# Patient Record
Sex: Male | Born: 1945 | Race: White | Hispanic: No | Marital: Married | Smoking: Former smoker
Health system: Southern US, Community
[De-identification: ages and names within clinical notes are randomized; demographics above are authoritative.]

## PROBLEM LIST (undated history)

## (undated) DIAGNOSIS — I509 Heart failure, unspecified: Secondary | ICD-10-CM

## (undated) DIAGNOSIS — Z860100 Personal history of colon polyps, unspecified: Secondary | ICD-10-CM

## (undated) DIAGNOSIS — E785 Hyperlipidemia, unspecified: Secondary | ICD-10-CM

## (undated) DIAGNOSIS — E669 Obesity, unspecified: Secondary | ICD-10-CM

## (undated) DIAGNOSIS — I10 Essential (primary) hypertension: Secondary | ICD-10-CM

## (undated) DIAGNOSIS — I739 Peripheral vascular disease, unspecified: Secondary | ICD-10-CM

## (undated) DIAGNOSIS — H9190 Unspecified hearing loss, unspecified ear: Secondary | ICD-10-CM

## (undated) DIAGNOSIS — I2510 Atherosclerotic heart disease of native coronary artery without angina pectoris: Secondary | ICD-10-CM

## (undated) DIAGNOSIS — L80 Vitiligo: Secondary | ICD-10-CM

## (undated) DIAGNOSIS — Z8669 Personal history of other diseases of the nervous system and sense organs: Secondary | ICD-10-CM

## (undated) DIAGNOSIS — N189 Chronic kidney disease, unspecified: Secondary | ICD-10-CM

## (undated) DIAGNOSIS — C801 Malignant (primary) neoplasm, unspecified: Secondary | ICD-10-CM

## (undated) DIAGNOSIS — I272 Pulmonary hypertension, unspecified: Secondary | ICD-10-CM

## (undated) HISTORY — DX: Unspecified hearing loss, unspecified ear: H91.90

## (undated) HISTORY — DX: Chronic kidney disease, unspecified: N18.9

## (undated) HISTORY — DX: Personal history of colonic polyps: Z86.010

## (undated) HISTORY — PX: SKIN BIOPSY: SHX1

## (undated) HISTORY — PX: TONSILLECTOMY AND ADENOIDECTOMY: SUR1326

## (undated) HISTORY — DX: Peripheral vascular disease, unspecified: I73.9

## (undated) HISTORY — DX: Obesity, unspecified: E66.9

## (undated) HISTORY — DX: Personal history of colon polyps, unspecified: Z86.0100

## (undated) HISTORY — DX: Personal history of other diseases of the nervous system and sense organs: Z86.69

## (undated) HISTORY — PX: FRACTURE SURGERY: SHX138

## (undated) HISTORY — DX: Vitiligo: L80

---

## 2001-01-30 HISTORY — PX: COLON SURGERY: SHX602

## 2001-02-08 ENCOUNTER — Ambulatory Visit (HOSPITAL_COMMUNITY): Admission: RE | Admit: 2001-02-08 | Discharge: 2001-02-08 | Payer: Self-pay | Admitting: Gastroenterology

## 2001-02-08 ENCOUNTER — Encounter (INDEPENDENT_AMBULATORY_CARE_PROVIDER_SITE_OTHER): Payer: Self-pay | Admitting: *Deleted

## 2001-03-07 ENCOUNTER — Encounter: Payer: Self-pay | Admitting: *Deleted

## 2001-03-13 ENCOUNTER — Inpatient Hospital Stay (HOSPITAL_COMMUNITY): Admission: RE | Admit: 2001-03-13 | Discharge: 2001-03-20 | Payer: Self-pay | Admitting: *Deleted

## 2001-03-13 ENCOUNTER — Encounter (INDEPENDENT_AMBULATORY_CARE_PROVIDER_SITE_OTHER): Payer: Self-pay

## 2003-12-03 ENCOUNTER — Encounter: Admission: RE | Admit: 2003-12-03 | Discharge: 2003-12-03 | Payer: Self-pay | Admitting: Family Medicine

## 2005-06-09 ENCOUNTER — Ambulatory Visit (HOSPITAL_COMMUNITY): Admission: RE | Admit: 2005-06-09 | Discharge: 2005-06-09 | Payer: Self-pay | Admitting: Urology

## 2005-07-21 ENCOUNTER — Ambulatory Visit (HOSPITAL_COMMUNITY): Admission: RE | Admit: 2005-07-21 | Discharge: 2005-07-21 | Payer: Self-pay | Admitting: Urology

## 2006-06-06 ENCOUNTER — Ambulatory Visit: Payer: Self-pay | Admitting: Family Medicine

## 2006-07-04 ENCOUNTER — Ambulatory Visit: Payer: Self-pay | Admitting: Family Medicine

## 2008-12-01 ENCOUNTER — Ambulatory Visit: Payer: Self-pay | Admitting: Family Medicine

## 2010-09-17 NOTE — Op Note (Signed)
[facility]  Patient:    [name], [name] Visit Number: [id number] MRN: [id number]          Service Type: SUR Location: 3W 0382 01 Attending Physician:  Kandis Mannan Dictated by:   Donnie Coffin Samuella Cota, M.D. Proc. Date: 03/13/01 Admit Date:  03/13/2001   CC:         Griffith Citron, M.D.  Elvina Sidle, M.D.   Operative Report  CCS:  [id number]  PREOPERATIVE DIAGNOSIS:  Villous adenoma right colon.  POSTOPERATIVE DIAGNOSIS:  Villous adenoma right colon.  OPERATION:  Right colectomy.  SURGEON:  Maisie Fus B. Samuella Cota, M.D.  ASSISTANT: Abigail Miyamoto, M.D.  ANESTHESIA:  General.  ANESTHESIOLOGIST:  Mack Guise, M.D. and CRNA [facility].  PROCEDURE:  The patient was taken to the operating room and placed on the table in the supine position.  After satisfactory general anesthesia with intubation, the entire abdomen was prepped and a Foley catheter was inserted. Sterile drapes were applied.  Midline incision was made from the upper midline around the left side of the umbilicus to the mid lower abdomen.  The incision was taken through the skin and subcutaneous tissues in the midline fascia.  The abdomen was entered and the small bowel was run from the ligament of Treitz to the ileocecal valve.  There were no abnormalities except for some slight adhesions of the terminal ileum.  The patients liver was quite high and felt normal.  The right colon was then taken down by dividing the lateral peritoneal reflections mostly with the cautery.  The hepatic flexure was taken down and the duodenum was seen and preserved.  The tumor could be felt in the right transverse colon which was to the right of the middle colic vessels. Staying 3-4 cm distal to the tumor, the large bowel was cross-clamped with Allen clamp and a Kocher clamp.  The terminal ileum was dissected free and only about one or two inches of terminal ileum was taken.  Allen clamp and Kocher  clamp were placed across the small bowel.  The mesenteric end to the small bowel and right transverse colon were taken down with the bleeders being ligated with 2-0 black silk with ileocolic vessels being doubly ligated. Mesenteric defect was closed with running suture of 2-0 chromic catgut.  The bowel was then anastomosed in an open anastomosis.  Prior to doing the anastomosis, the specimen was opened and seen to be about a 3 cm margin from the tumor to the edge of the bowel.  The sutures used to open the bowel actually extended towards the tumor.  Dr. Delorse Lek reported that the margin was only about 5 mm but I think he was seeing where I cut to the tumor as I opened the bowel.  I was satisfied that I had at least a 3 cm margin.  The bowel was anastomosed with 3-0 black silk as through-and-through sutures with the knots on the inside except for the last few sutures which were placed as Gambee sutures.  A liberal two fingerbreadth anastomosis was created and seen to be air tight.  The patients right upper quadrant was irrigated. Hemostasis was obtained.  The patient had considerable amount of omentum which was lying over the anastomosis.  The abdomen was then closed without drains. The midline fascia was closed with two #1 Novofil sutures with the knot tied in the middle.  Subcutaneous tissues was copiously irrigated.  The skin was closed with skin staples.  Dry, sterile, dressing was applied.  The patient was seen to tolerate the procedure well and was taken to the PACU in satisfactory condition. Dictated by:   Donnie Coffin Samuella Cota, M.D. Attending Physician:  Kandis Mannan DD:  03/13/01 TD:  03/13/01 Job: [id number]

## 2010-09-17 NOTE — Discharge Summary (Signed)
[facility]  Patient:    [name], [name] Visit Number: [id number] MRN: [id number]          Service Type: SUR Location: 3W 0382 01 Attending Physician:  Kandis Mannan Dictated by:   Donnie Coffin Samuella Cota, M.D. Admit Date:  03/13/2001 Discharge Date: 03/20/2001   CC:         Griffith Citron, M.D.  Elvina Sidle, M.D.   Discharge Summary  CHIEF COMPLAINT:  Villous adenoma, right colon.  HISTORY OF PRESENT ILLNESS:  This 65 year old white male was seen in consultation with Dr. Sharrell Ku and Dr. Elvina Sidle.  The patient had had occasional bright red rectal bleeding with constipated stools in the past but otherwise had no GI symptoms.  He underwent screening colonoscopy on February 08, 2001 by Dr. Ritta Slot.  The patient was found to have a 4-cm fungating mucosal lesion of the right colon at the hepatic flexure.  Biopsies revealed villous adenoma with no high-grade dysplasia or malignancy.  The patient also had six other polyps removed by Dr. Kinnie Scales and all of these were benign.  I saw the patient in the office on February 15, 2001.  Because of the size of the tumor, it could not be removed through the colonoscope and the patient was placed on a bowel prep at home and admitted now for a right colectomy.  The patient has undergone a successful bowel prep at home.  ALLERGIES:  None but CODEINE produces a headache.  MEDICATIONS:  None.  OPERATIONS:  Surgery on his left leg and ankle in 1968 following a fracture with apparently exposed bone.  Also had surgery on his left hand at his index finger following a cat bite in 1988.  SERIOUS ILLNESSES/INJURIES:  See above.  REVIEW OF SYSTEMS:  Wears glasses.  Smoked for about 35 years but quit five years ago.  No cardiac abnormalities.  Bowel elimination usually once a day. The patient was told that he may have passed a renal stone four or five years ago but the stone was never retrieved.  He  has occasional nocturia x 1.  MARITAL HISTORY:  Wife is 55 years old.  They have one daughter.  PHYSICAL EXAMINATION:  VITAL SIGNS:  Weight 260 pounds.  GENERAL:  The patient is a well-nourished, well-developed, obese male in no acute distress.  ABDOMEN:  Abdomen was obese.  Liver, spleen, kidneys were not felt.  Both testicles were down without masses.  No evidence of groin hernias.  RECTAL:  No masses.  IMPRESSION:  Villous adenoma, right colon.  HOSPITAL COURSE:  The patient was taken to the operating room on March 13, 2001, at which time he underwent a right colectomy.  He was found to have approximately a 4-cm tumor at the hepatic flexure in the transverse colon just to the right of the middle colic vessels.  Grossly, there was about a 3-cm margin from the end of the bowel.  Exploration was otherwise unremarkable.  Nasogastric tube was not placed but the patient was left n.p.o. A Foley catheter was inserted, which was removed on the second postoperative day.  The patient was started on clear liquids on the fourth postoperative day, and then progressed to a regular diet on his sixth postoperative day.  He had a bowel movement before he was discharged on the seventh postoperative day.  His wound was healing nicely with no evidence of any infection.  The skin staples were removed and benzoin and Steri-Strips were applied.  LABORATORY  DATA:  Laboratory data while in the hospital revealed a preoperative hemoglobin of 15.3 with hematocrit of 44.0, white count was 7200. Postop hemoglobin was 13.9, hematocrit 40.1, with a white count of 7700.  CMET was entirely within normal limits.  EKG showed normal sinus rhythm, nonspecific ST abnormalities, abnormal EKG. Chest x-ray showed no active disease.  FINAL DIAGNOSIS:  Villous adenoma, right transverse colon, with no invasive tumor identified.  The tumor size was 3.4 x 2.7 x 0.9 cm.  Nine mesenteric lymph nodes showed no tumor  identified.  OPERATION:  March 13, 2001 right colectomy.  CONDITION ON DISCHARGE:  Improved.  DIET:  The patient was discharged on a regular diet.  DISCHARGE MEDICATIONS:  Vicodin for pain.  FOLLOWUP:  Advised to see me back in the office for followup in 10-12 days. Dictated by:   Donnie Coffin Samuella Cota, M.D. Attending Physician:  Kandis Mannan DD:  04/02/01 TD:  04/02/01 Job: [id number]

## 2010-11-08 ENCOUNTER — Encounter: Payer: Self-pay | Admitting: Family Medicine

## 2011-08-22 DIAGNOSIS — L219 Seborrheic dermatitis, unspecified: Secondary | ICD-10-CM | POA: Insufficient documentation

## 2019-01-23 ENCOUNTER — Other Ambulatory Visit: Payer: Self-pay | Admitting: Urology

## 2019-01-30 NOTE — Patient Instructions (Addendum)
DUE TO COVID-19 ONLY ONE VISITOR IS ALLOWED TO COME WITH YOU AND STAY IN THE WAITING ROOM ONLY DURING PRE OP AND PROCEDURE DAY OF SURGERY. THE 1 VISITOR MAY VISIT WITH YOU AFTER SURGERY IN YOUR PRIVATE ROOM DURING VISITING HOURS ONLY!  YOU NEED TO HAVE A COVID 19 TEST ON 01-31-19 @ 2:50 PM, THIS TEST MUST BE DONE BEFORE SURGERY, COME  [address], [place] , [address].  ([place]) ONCE YOUR COVID TEST IS COMPLETED, PLEASE BEGIN THE QUARANTINE INSTRUCTIONS AS OUTLINED IN YOUR HANDOUT.                [name]  01/30/2019   Your procedure is scheduled on: 02-04-19    Report to [facility] Main  Entrance    Report to Admitting at 8:45 AM     Call this number if you have problems the morning of surgery [phone]    Remember: Do not eat food or drink liquids :After Midnight.      Take these medicines the morning of surgery with A SIP OF WATER: None  BRUSH YOUR TEETH MORNING OF SURGERY AND RINSE YOUR MOUTH OUT, NO CHEWING GUM CANDY OR MINTS.                                 You may not have any metal on your body including hair pins and              piercings     Do not wear jewelry,cologne, lotions, powders or deodorant                        Men may shave face and neck.   Do not bring valuables to the hospital. [place].  Contacts, dentures or bridgework may not be worn into surgery.      Patients discharged the day of surgery will not be allowed to drive home. IF YOU ARE HAVING SURGERY AND GOING HOME THE SAME DAY, YOU MUST HAVE AN ADULT TO DRIVE YOU HOME AND BE WITH YOU FOR 24 HOURS. YOU MAY GO HOME BY TAXI OR UBER OR ORTHERWISE, BUT AN ADULT MUST ACCOMPANY YOU HOME AND STAY WITH YOU FOR 24 HOURS.    Name and phone number of your driver: Travyn Hulick [phone]  Special Instructions: N/A              Please read over the following fact sheets you were  given: _____________________________________________________________________             [facility] - Preparing for Surgery Before surgery, you can play an important role.  Because skin is not sterile, your skin needs to be as free of germs as possible.  You can reduce the number of germs on your skin by washing with CHG (chlorahexidine gluconate) soap before surgery.  CHG is an antiseptic cleaner which kills germs and bonds with the skin to continue killing germs even after washing. Please DO NOT use if you have an allergy to CHG or antibacterial soaps.  If your skin becomes reddened/irritated stop using the CHG and inform your nurse when you arrive at Short Stay. Do not shave (including legs and underarms) for at least 48 hours prior to the first CHG shower.  You may shave your  face/neck. Please follow these instructions carefully:  1.  Shower with CHG Soap the night before surgery and the  morning of Surgery.  2.  If you choose to wash your hair, wash your hair first as usual with your  normal  shampoo.  3.  After you shampoo, rinse your hair and body thoroughly to remove the  shampoo.                           4.  Use CHG as you would any other liquid soap.  You can apply chg directly  to the skin and wash                       Gently with a scrungie or clean washcloth.  5.  Apply the CHG Soap to your body ONLY FROM THE NECK DOWN.   Do not use on face/ open                           Wound or open sores. Avoid contact with eyes, ears mouth and genitals (private parts).                       Wash face,  Genitals (private parts) with your normal soap.             6.  Wash thoroughly, paying special attention to the area where your surgery  will be performed.  7.  Thoroughly rinse your body with warm water from the neck down.  8.  DO NOT shower/wash with your normal soap after using and rinsing off  the CHG Soap.                9.  Pat yourself dry with a clean towel.            10.  Wear  clean pajamas.            11.  Place clean sheets on your bed the night of your first shower and do not  sleep with pets. Day of Surgery : Do not apply any lotions/deodorants the morning of surgery.  Please wear clean clothes to the hospital/surgery center.  FAILURE TO FOLLOW THESE INSTRUCTIONS MAY RESULT IN THE CANCELLATION OF YOUR SURGERY PATIENT SIGNATURE_________________________________  NURSE SIGNATURE__________________________________  ________________________________________________________________________

## 2019-01-31 ENCOUNTER — Other Ambulatory Visit: Payer: Self-pay

## 2019-01-31 ENCOUNTER — Other Ambulatory Visit (HOSPITAL_COMMUNITY)
Admission: RE | Admit: 2019-01-31 | Discharge: 2019-01-31 | Disposition: A | Discharge status: Home / Self Care | Payer: Medicare Other | Source: Ambulatory Visit | Attending: Urology | Admitting: Urology

## 2019-01-31 ENCOUNTER — Encounter (HOSPITAL_COMMUNITY)
Admission: RE | Admit: 2019-01-31 | Discharge: 2019-01-31 | Disposition: A | Discharge status: Home / Self Care | Payer: Medicare Other | Source: Ambulatory Visit | Attending: Urology | Admitting: Urology

## 2019-01-31 ENCOUNTER — Encounter (HOSPITAL_COMMUNITY): Payer: Self-pay

## 2019-01-31 ENCOUNTER — Encounter (INDEPENDENT_AMBULATORY_CARE_PROVIDER_SITE_OTHER): Payer: Self-pay

## 2019-01-31 DIAGNOSIS — N201 Calculus of ureter: Secondary | ICD-10-CM | POA: Diagnosis not present

## 2019-01-31 DIAGNOSIS — Z01812 Encounter for preprocedural laboratory examination: Secondary | ICD-10-CM | POA: Insufficient documentation

## 2019-01-31 DIAGNOSIS — Z20828 Contact with and (suspected) exposure to other viral communicable diseases: Secondary | ICD-10-CM | POA: Insufficient documentation

## 2019-01-31 LAB — CBC
HCT: 49.5 % (ref 39.0–52.0)
Hemoglobin: 16.1 g/dL (ref 13.0–17.0)
MCH: 29 pg (ref 26.0–34.0)
MCHC: 32.5 g/dL (ref 30.0–36.0)
MCV: 89 fL (ref 80.0–100.0)
Platelets: 269 10*3/uL (ref 150–400)
RBC: 5.56 MIL/uL (ref 4.22–5.81)
RDW: 13.7 % (ref 11.5–15.5)
WBC: 9.8 10*3/uL (ref 4.0–10.5)
nRBC: 0 % (ref 0.0–0.2)

## 2019-01-31 LAB — BASIC METABOLIC PANEL
Anion gap: 8 (ref 5–15)
BUN: 15 mg/dL (ref 8–23)
CO2: 24 mmol/L (ref 22–32)
Calcium: 9 mg/dL (ref 8.9–10.3)
Chloride: 104 mmol/L (ref 98–111)
Creatinine, Ser: 0.94 mg/dL (ref 0.61–1.24)
GFR calc Af Amer: >60 mL/min (ref >60)
GFR calc non Af Amer: >60 mL/min (ref >60)
Glucose, Bld: 98 mg/dL (ref 70–99)
Potassium: 4.7 mmol/L (ref 3.5–5.1)
Sodium: 136 mmol/L (ref 135–145)

## 2019-01-31 NOTE — Progress Notes (Signed)
PCP - Dr. Veronia Beets Cardiologist -   Chest x-ray -  EKG -  Stress Test -  ECHO -  Cardiac Cath -   Sleep Study -  CPAP -   Fasting Blood Sugar -  Checks Blood Sugar _____ times a day  Blood Thinner Instructions: Aspirin Instructions: Last Dose:  Anesthesia review:   Patient denies shortness of breath, fever, cough and chest pain at PAT appointment   Patient verbalized understanding of instructions that were given to them at the PAT appointment. Patient was also instructed that they will need to review over the PAT instructions again at home before surgery.

## 2019-02-01 LAB — NOVEL CORONAVIRUS, NAA (HOSP ORDER, SEND-OUT TO REF LAB; TAT 18-24 HRS): SARS-CoV-2, NAA: NOT DETECTED

## 2019-02-02 NOTE — H&P (Signed)
Office Visit Report     01/29/2019   --------------------------------------------------------------------------------   [name]. Tarius Conary  MRN: [id number]  DOB: 1945-07-20, 73 year old Male  SSN: [id number]   PRIMARY CARE:  Chauncey Reading, MD  REFERRING:  Raynelle Bring, Eduardo Osier  PROVIDER:  Raynelle Bring, M.D.  TREATING:  Leta [place], [place]  LOCATION:  [facility] [phone]     --------------------------------------------------------------------------------   CC/HPI: Pt presents today for pre-operative history and physical exam in anticipation of cystoscopy, left retrograde pyelogram, left ureteroscopic laser lithotripsy, and left stent placement for a 60mm left UPJ stone by Dr. [name] on 02/04/19. He is doing well and is without complaint. He has not needed to use the pain medication given to him at his last visit.   Pt denies F/C, HA, CP, SOB, N/V, diarrhea/constipation, back pain, flank pain, hematuria, and dysuria.    HX:   1. Elevated PSA  2. BPH/LUTS  3. Urolithiasis  4. Microscopic hematuria   [name] follows up today after having been found to have incidentally detected microscopic hematuria with 40-60 red blood cells per high-powered field at his recent routine follow-up. He is continues to deny any hematuria. He has not passed any recent stones. He denies any flank pain symptoms. He follows up today to undergo a hematuria protocol CT scan and cystoscopy.     ALLERGIES: Codeine Derivatives - headaches    MEDICATIONS: Hydrocodone-Acetaminophen 5 mg-325 mg tablet 1-2 tablet PO Q 6 H prn  Ibuprofen CAPS Oral  Saw Grass     GU PSH: Cystoscopy - 12/14/2018 Locm 300-399Mg /Ml Iodine,1Ml - 12/14/2018       PSH Notes: Partial Colectomy, Leg Repair  lithotripsy  tonsillectomy   NON-GU PSH: Partial colectomy - 2017     GU PMH: Microscopic hematuria - 12/14/2018 Ureteral calculus - 12/14/2018 History of urolithiasis - 11/22/2017 BPH  w/LUTS - 2017 Urinary Frequency - 2017 Elevated PSA, Elevated prostate specific antigen (PSA) - 2017      PMH Notes:   1) Elevated PSA: He presented to me with a PSA in March 2017. His PSA was 5.66 and he underwent a TRUS biopsy of the prostate that was benign. He does have a family history of prostate with his father having been diagnosed at age 66 and undergone treatment with a radical prostatectomy.   Apr 2017: 12 core biopsy - benign, Vol 183.7 cc   2) BPH/LUTS: His baseline IPSS was 8 in March 2017. His prostate volume has been measured at over 180 cc.   3) Urolithiasis: He does have a history of urolithiasis. In 2019, he did spontaneously pass a stone that was confirmed to be calcium oxalate.     pt is unaware of hyperglycemia diagnosis. He has not been made aware of this and does not take medication for it.*      NON-GU PMH: Hyperglycemia, unspecified Hypertension Obesity Other idiopathic peripheral autonomic neuropathy    FAMILY HISTORY: Prostate Cancer - Father   SOCIAL HISTORY: Marital Status: Married Preferred Language: English; Ethnicity: Not Hispanic Or Latino; Race: White Current Smoking Status: Patient does not smoke anymore. Has not smoked since 01/30/1994.  Does not use smokeless tobacco. Has never drank.  Does not use drugs. Does not drink caffeine. Has not had a blood transfusion.     Notes: Former smoker   REVIEW OF SYSTEMS:    GU Review Male:   Patient reports frequent urination, hard to postpone urination, burning/ pain with urination, get up  at night to urinate, and leakage of urine. Patient denies stream starts and stops, trouble starting your stream, have to strain to urinate , erection problems, and penile pain.  Gastrointestinal (Upper):   Patient denies nausea, vomiting, and indigestion/ heartburn.  Gastrointestinal (Lower):   Patient reports constipation. Patient denies diarrhea.  Constitutional:   Patient denies fever, night sweats, weight  loss, and fatigue.  Skin:   Patient denies itching and skin rash/ lesion.  Eyes:   Patient denies blurred vision and double vision.  Ears/ Nose/ Throat:   Patient denies sore throat and sinus problems.  Hematologic/Lymphatic:   Patient denies swollen glands and easy bruising.  Cardiovascular:   Patient denies leg swelling and chest pains.  Respiratory:   Patient denies cough and shortness of breath.  Endocrine:   Patient denies excessive thirst.  Musculoskeletal:   Patient reports back pain. Patient denies joint pain.  Neurological:   Patient denies headaches and dizziness.  Psychologic:   Patient denies depression and anxiety.   Notes: blood in urine    VITAL SIGNS:      01/29/2019 03:37 PM  Weight 282 lb / 127.91 kg  Height 68 in / 172.72 cm  BP 157/89 mmHg  Pulse 81 /min  Temperature 98.1 F / 36.7 C  BMI 42.9 kg/m   MULTI-SYSTEM PHYSICAL EXAMINATION:    Constitutional: Well-nourished. No physical deformities. Normally developed. Good grooming.  Neck: Neck symmetrical, not swollen. Normal tracheal position.  Respiratory: Normal breath sounds. No labored breathing, no use of accessory muscles.   Cardiovascular: Regular rate and rhythm. No murmur, no gallop. Normal temperature, normal extremity pulses, no swelling, no varicosities.   Lymphatic: No enlargement of neck, axillae, groin.  Skin: No paleness, no jaundice, no cyanosis. No lesion, no ulcer, no rash.  Neurologic / Psychiatric: Oriented to time, oriented to place, oriented to person. No depression, no anxiety, no agitation.  Gastrointestinal: No mass, no tenderness, no rigidity, obese abdomen.   Eyes: Normal conjunctivae. Normal eyelids.  Ears, Nose, Mouth, and Throat: Left ear no scars, no lesions, no masses. Right ear no scars, no lesions, no masses. Nose no scars, no lesions, no masses. Normal hearing. Normal lips.  Musculoskeletal: Normal gait and station of head and neck.     PAST DATA REVIEWED:  Source Of  History:  Patient  Records Review:   Previous Patient Records  Urine Test Review:   Urinalysis   01/29/19  Urinalysis  Urine Appearance Cloudy   Urine Color Yellow   Urine Glucose Neg mg/dL  Urine Bilirubin Neg mg/dL  Urine Ketones Neg mg/dL  Urine Specific Gravity 1.025   Urine Blood 3+ ery/uL  Urine pH 6.0   Urine Protein 1+ mg/dL  Urine Urobilinogen 0.2 mg/dL  Urine Nitrites Neg   Urine Leukocyte Esterase 3+ leu/uL  Urine WBC/hpf Packed/hpf   Urine RBC/hpf 10 - 20/hpf   Urine Epithelial Cells 0 - 5/hpf   Urine Bacteria Few (10-25/hpf)   Urine Mucous Present   Urine Yeast NS (Not Seen)   Urine Trichomonas Not Present   Urine Cystals NS (Not Seen)   Urine Casts NS (Not Seen)   Urine Sperm Not Present    PROCEDURES:          Urinalysis w/Scope - 81001 Dipstick Dipstick Cont'd Micro  Color: Yellow Bilirubin: Neg mg/dL WBC/hpf: Packed/hpf  Appearance: Cloudy Ketones: Neg mg/dL RBC/hpf: 10 - 20/hpf  Specific Gravity: 1.025 Blood: 3+ ery/uL Bacteria: Few (10-25/hpf)  pH: 6.0 Protein: 1+ mg/dL Cystals:  NS (Not Seen)  Glucose: Neg mg/dL Urobilinogen: 0.2 mg/dL Casts: NS (Not Seen)    Nitrites: Neg Trichomonas: Not Present    Leukocyte Esterase: 3+ leu/uL Mucous: Present      Epithelial Cells: 0 - 5/hpf      Yeast: NS (Not Seen)      Sperm: Not Present    ASSESSMENT:      ICD-10 Details  1 GU:   Ureteral calculus - N20.1    PLAN:            Medications Stop Meds: Saw Grass  Discontinue: 01/29/2019  - Reason: The medication cycle was completed.            Orders Labs Urine Culture          Schedule Return Visit/Planned Activity: Keep Scheduled Appointment - Schedule Surgery          Document Letter(s):  Created for Patient: Clinical Summary         Notes:   There are no changes in the patients history or physical exam since last evaluation by Dr. [name]. Pt is scheduled to undergo left ureteroscopic laser litho and stent placement on 02/04/19.   Will  check urine culture due to bacteria noted on UA. Urine appearance is most likely due to stone, however, will r/o infection prior to procedure.   All pt's questions were answered to the best of my ability.          Next Appointment:      Next Appointment: 02/04/2019 10:30 AM    Appointment Type: Surgery     Location: [facility] [phone]    Provider: Raynelle Bring, M.D.    Reason for Visit: WL/OP CYSTO, LT RPG, LT, URS HLL LT UR STENT      * Signed by Mcarthur Rossetti, PA on 01/29/19 at 4:06 PM (EDT)*

## 2019-02-03 MED ORDER — DEXTROSE 5 % IV SOLN
3.0000 g | INTRAVENOUS | Status: AC
  Administered 2019-02-04: 3 g via INTRAVENOUS
  Filled 2019-02-03: qty 3

## 2019-02-04 ENCOUNTER — Ambulatory Visit (HOSPITAL_COMMUNITY): Payer: Medicare Other | Admitting: Anesthesiology

## 2019-02-04 ENCOUNTER — Inpatient Hospital Stay (HOSPITAL_COMMUNITY)
Admission: RE | Admit: 2019-02-04 | Discharge: 2019-02-06 | DRG: 694 | Disposition: A | Discharge status: Home / Self Care | Payer: Medicare Other | Attending: Urology | Admitting: Urology

## 2019-02-04 ENCOUNTER — Other Ambulatory Visit: Payer: Self-pay

## 2019-02-04 ENCOUNTER — Ambulatory Visit (HOSPITAL_COMMUNITY): Payer: Medicare Other

## 2019-02-04 ENCOUNTER — Ambulatory Visit (HOSPITAL_COMMUNITY): Payer: Medicare Other | Admitting: Physician Assistant

## 2019-02-04 ENCOUNTER — Encounter (HOSPITAL_COMMUNITY): Payer: Self-pay

## 2019-02-04 ENCOUNTER — Encounter (HOSPITAL_COMMUNITY)
Admission: RE | Disposition: A | Discharge status: Home / Self Care | Payer: Self-pay | Source: Home / Self Care | Attending: Urology

## 2019-02-04 DIAGNOSIS — G9009 Other idiopathic peripheral autonomic neuropathy: Secondary | ICD-10-CM | POA: Diagnosis present

## 2019-02-04 DIAGNOSIS — T80818A Extravasation of other vesicant agent, initial encounter: Secondary | ICD-10-CM | POA: Diagnosis present

## 2019-02-04 DIAGNOSIS — Z8719 Personal history of other diseases of the digestive system: Secondary | ICD-10-CM

## 2019-02-04 DIAGNOSIS — Z6841 Body Mass Index (BMI) 40.0 and over, adult: Secondary | ICD-10-CM

## 2019-02-04 DIAGNOSIS — Y92234 Operating room of hospital as the place of occurrence of the external cause: Secondary | ICD-10-CM | POA: Diagnosis present

## 2019-02-04 DIAGNOSIS — N401 Enlarged prostate with lower urinary tract symptoms: Secondary | ICD-10-CM | POA: Diagnosis present

## 2019-02-04 DIAGNOSIS — D72829 Elevated white blood cell count, unspecified: Secondary | ICD-10-CM | POA: Diagnosis not present

## 2019-02-04 DIAGNOSIS — Z8042 Family history of malignant neoplasm of prostate: Secondary | ICD-10-CM

## 2019-02-04 DIAGNOSIS — R110 Nausea: Secondary | ICD-10-CM | POA: Diagnosis not present

## 2019-02-04 DIAGNOSIS — N200 Calculus of kidney: Secondary | ICD-10-CM

## 2019-02-04 DIAGNOSIS — Z9049 Acquired absence of other specified parts of digestive tract: Secondary | ICD-10-CM

## 2019-02-04 DIAGNOSIS — R739 Hyperglycemia, unspecified: Secondary | ICD-10-CM | POA: Diagnosis present

## 2019-02-04 DIAGNOSIS — Z87891 Personal history of nicotine dependence: Secondary | ICD-10-CM

## 2019-02-04 DIAGNOSIS — N202 Calculus of kidney with calculus of ureter: Principal | ICD-10-CM | POA: Diagnosis present

## 2019-02-04 DIAGNOSIS — Y848 Other medical procedures as the cause of abnormal reaction of the patient, or of later complication, without mention of misadventure at the time of the procedure: Secondary | ICD-10-CM | POA: Diagnosis present

## 2019-02-04 DIAGNOSIS — N3941 Urge incontinence: Secondary | ICD-10-CM | POA: Diagnosis present

## 2019-02-04 DIAGNOSIS — Z87442 Personal history of urinary calculi: Secondary | ICD-10-CM

## 2019-02-04 DIAGNOSIS — Z885 Allergy status to narcotic agent status: Secondary | ICD-10-CM

## 2019-02-04 HISTORY — PX: CYSTOSCOPY/URETEROSCOPY/HOLMIUM LASER/STENT PLACEMENT: SHX6546

## 2019-02-04 LAB — PROTIME-INR
INR: 1 (ref 0.8–1.2)
Prothrombin Time: 13.3 seconds (ref 11.4–15.2)

## 2019-02-04 SURGERY — CYSTOSCOPY/URETEROSCOPY/HOLMIUM LASER/STENT PLACEMENT
Anesthesia: General | Site: Ureter | Laterality: Left

## 2019-02-04 MED ORDER — PROPOFOL 10 MG/ML IV BOLUS
INTRAVENOUS | Status: AC
  Filled 2019-02-04: qty 20

## 2019-02-04 MED ORDER — ONDANSETRON HCL 4 MG/2ML IJ SOLN
INTRAMUSCULAR | Status: AC
  Filled 2019-02-04: qty 2

## 2019-02-04 MED ORDER — FENTANYL CITRATE (PF) 100 MCG/2ML IJ SOLN
INTRAMUSCULAR | Status: DC | PRN
  Administered 2019-02-04 (×3): 50 ug via INTRAVENOUS

## 2019-02-04 MED ORDER — DOCUSATE SODIUM 100 MG PO CAPS
100.0000 mg | ORAL_CAPSULE | Freq: Two times a day (BID) | ORAL | Status: DC
  Administered 2019-02-04 – 2019-02-06 (×4): 100 mg via ORAL
  Filled 2019-02-04 (×4): qty 1

## 2019-02-04 MED ORDER — DEXAMETHASONE SODIUM PHOSPHATE 10 MG/ML IJ SOLN
INTRAMUSCULAR | Status: DC | PRN
  Administered 2019-02-04: 8 mg via INTRAVENOUS

## 2019-02-04 MED ORDER — ACETAMINOPHEN 325 MG PO TABS: 650.0000 mg | ORAL_TABLET | ORAL | Status: DC | PRN

## 2019-02-04 MED ORDER — ONDANSETRON HCL 4 MG/2ML IJ SOLN: 4.0000 mg | Freq: Once | INTRAMUSCULAR | Status: DC | PRN

## 2019-02-04 MED ORDER — ONDANSETRON HCL 4 MG/2ML IJ SOLN
INTRAMUSCULAR | Status: DC | PRN
  Administered 2019-02-04: 4 mg via INTRAVENOUS

## 2019-02-04 MED ORDER — LACTATED RINGERS IV SOLN
INTRAVENOUS | Status: DC
  Administered 2019-02-04 (×2): via INTRAVENOUS

## 2019-02-04 MED ORDER — CALCIUM CARBONATE ANTACID 500 MG PO CHEW
1.0000 | CHEWABLE_TABLET | Freq: Once | ORAL | Status: AC
  Administered 2019-02-04: 200 mg via ORAL
  Filled 2019-02-04: qty 1

## 2019-02-04 MED ORDER — BELLADONNA ALKALOIDS-OPIUM 16.2-60 MG RE SUPP
1.0000 | Freq: Four times a day (QID) | RECTAL | Status: DC | PRN
  Administered 2019-02-04: 1 via RECTAL
  Filled 2019-02-04: qty 1

## 2019-02-04 MED ORDER — OXYBUTYNIN CHLORIDE 5 MG PO TABS
5.0000 mg | ORAL_TABLET | Freq: Once | ORAL | Status: AC
  Administered 2019-02-04: 22:00:00 5 mg via ORAL
  Filled 2019-02-04: qty 1

## 2019-02-04 MED ORDER — LIDOCAINE 2% (20 MG/ML) 5 ML SYRINGE
INTRAMUSCULAR | Status: DC | PRN
  Administered 2019-02-04: 100 mg via INTRAVENOUS

## 2019-02-04 MED ORDER — HYDROMORPHONE HCL 1 MG/ML IJ SOLN
0.5000 mg | INTRAMUSCULAR | Status: DC | PRN
  Administered 2019-02-04: 1 mg via INTRAVENOUS
  Administered 2019-02-04: 0.5 mg via INTRAVENOUS
  Administered 2019-02-04: 1 mg via INTRAVENOUS
  Filled 2019-02-04 (×3): qty 1

## 2019-02-04 MED ORDER — ACETAMINOPHEN 500 MG PO TABS: 1000.0000 mg | ORAL_TABLET | Freq: Once | ORAL | Status: AC

## 2019-02-04 MED ORDER — LIDOCAINE 2% (20 MG/ML) 5 ML SYRINGE
INTRAMUSCULAR | Status: AC
  Filled 2019-02-04: qty 5

## 2019-02-04 MED ORDER — DIPHENHYDRAMINE HCL 12.5 MG/5ML PO ELIX
12.5000 mg | ORAL_SOLUTION | Freq: Four times a day (QID) | ORAL | Status: DC | PRN
  Administered 2019-02-06: 12.5 mg via ORAL
  Filled 2019-02-04: qty 5

## 2019-02-04 MED ORDER — DIPHENHYDRAMINE HCL 50 MG/ML IJ SOLN
12.5000 mg | Freq: Four times a day (QID) | INTRAMUSCULAR | Status: DC | PRN
  Administered 2019-02-05: 04:00:00 12.5 mg via INTRAVENOUS
  Filled 2019-02-04: qty 1

## 2019-02-04 MED ORDER — PROPOFOL 10 MG/ML IV BOLUS
INTRAVENOUS | Status: DC | PRN
  Administered 2019-02-04: 140 mg via INTRAVENOUS

## 2019-02-04 MED ORDER — SODIUM CHLORIDE 0.9 % IV SOLN
INTRAVENOUS | Status: DC
  Administered 2019-02-04 – 2019-02-06 (×3): via INTRAVENOUS

## 2019-02-04 MED ORDER — SODIUM CHLORIDE 0.9 % IR SOLN
Status: DC | PRN
  Administered 2019-02-04: 3000 mL

## 2019-02-04 MED ORDER — FENTANYL CITRATE (PF) 100 MCG/2ML IJ SOLN
INTRAMUSCULAR | Status: AC
  Filled 2019-02-04: qty 2

## 2019-02-04 MED ORDER — FENTANYL CITRATE (PF) 100 MCG/2ML IJ SOLN: 25.0000 ug | INTRAMUSCULAR | Status: DC | PRN

## 2019-02-04 MED ORDER — IOHEXOL 300 MG/ML  SOLN
INTRAMUSCULAR | Status: DC | PRN
  Administered 2019-02-04: 10 mL

## 2019-02-04 MED ORDER — ZOLPIDEM TARTRATE 5 MG PO TABS
5.0000 mg | ORAL_TABLET | Freq: Every evening | ORAL | Status: DC | PRN
  Administered 2019-02-05: 5 mg via ORAL
  Filled 2019-02-04: qty 1

## 2019-02-04 MED ORDER — PHENYLEPHRINE HCL (PRESSORS) 10 MG/ML IV SOLN
INTRAVENOUS | Status: DC | PRN
  Administered 2019-02-04: 40 ug via INTRAVENOUS

## 2019-02-04 MED ORDER — ONDANSETRON HCL 4 MG/2ML IJ SOLN
4.0000 mg | INTRAMUSCULAR | Status: DC | PRN
  Administered 2019-02-04 – 2019-02-05 (×2): 4 mg via INTRAVENOUS
  Filled 2019-02-04 (×2): qty 2

## 2019-02-04 MED ORDER — SODIUM CHLORIDE 0.9 % IV SOLN
1.0000 g | INTRAVENOUS | Status: DC
  Administered 2019-02-04 – 2019-02-06 (×3): 1 g via INTRAVENOUS
  Filled 2019-02-04 (×2): qty 1
  Filled 2019-02-04: qty 10

## 2019-02-04 MED ORDER — DEXAMETHASONE SODIUM PHOSPHATE 10 MG/ML IJ SOLN
INTRAMUSCULAR | Status: AC
  Filled 2019-02-04: qty 1

## 2019-02-04 SURGICAL SUPPLY — 17 items
BAG URO CATCHER STRL LF (MISCELLANEOUS) ×2 IMPLANT
BASKET ZERO TIP NITINOL 2.4FR (BASKET) IMPLANT
CATH INTERMIT  6FR 70CM (CATHETERS) ×1 IMPLANT
CLOTH BEACON ORANGE TIMEOUT ST (SAFETY) ×2 IMPLANT
COVER WAND RF STERILE (DRAPES) IMPLANT
FIBER LASER FLEXIVA 365 (UROLOGICAL SUPPLIES) IMPLANT
FIBER LASER TRAC TIP (UROLOGICAL SUPPLIES) IMPLANT
GLOVE BIOGEL M STRL SZ7.5 (GLOVE) ×2 IMPLANT
GOWN STRL REUS W/TWL LRG LVL3 (GOWN DISPOSABLE) ×4 IMPLANT
GUIDEWIRE ANG ZIPWIRE 038X150 (WIRE) ×1 IMPLANT
GUIDEWIRE STR DUAL SENSOR (WIRE) ×2 IMPLANT
KIT TURNOVER KIT A (KITS) IMPLANT
MANIFOLD NEPTUNE II (INSTRUMENTS) ×2 IMPLANT
PACK CYSTO (CUSTOM PROCEDURE TRAY) ×2 IMPLANT
SHEATH URETERAL 12FRX35CM (MISCELLANEOUS) ×1 IMPLANT
TUBING CONNECTING 10 (TUBING) ×2 IMPLANT
TUBING UROLOGY SET (TUBING) ×2 IMPLANT

## 2019-02-04 NOTE — Transfer of Care (Signed)
Immediate Anesthesia Transfer of Care Note  Patient: [name]  Procedure(s) Performed: CYSTOSCOPY Left retrograde and attempted left ureteroscopy (Left Ureter)  Patient Location: PACU  Anesthesia Type:General  Level of Consciousness: awake  Airway & Oxygen Therapy: Patient Spontanous Breathing and Patient connected to face mask oxygen  Post-op Assessment: Report given to RN, Post -op Vital signs reviewed and stable and Patient moving all extremities X 4  Post vital signs: Reviewed and stable  Last Vitals:  Vitals Value Taken Time  BP    Temp    Pulse 68 02/04/19 1146  Resp    SpO2 100 % 02/04/19 1146  Vitals shown include unvalidated device data.  Last Pain:  Vitals:   02/04/19 0923  TempSrc:   PainSc: 0-No pain         Complications: No apparent anesthesia complications

## 2019-02-04 NOTE — Discharge Instructions (Addendum)
1. You may see some blood in the urine and may have some burning with urination for 48-72 hours. You also may notice that you have to urinate more frequently or urgently after your procedure which is normal.  2. You should call should you develop an inability urinate, fever > 101, persistent nausea and vomiting that prevents you from eating or drinking to stay hydrated.  3. If you have a stent, you will likely urinate more frequently and urgently until the stent is removed and you may experience some discomfort/pain in the lower abdomen and flank especially when urinating. You may take pain medication prescribed to you if needed for pain. You may also intermittently have blood in the urine until the stent is removed. 4. If you have a catheter, you will be taught how to take care of the catheter by the nursing staff prior to discharge from the hospital.  You may periodically feel a strong urge to void with the catheter in place.  This is a bladder spasm and most often can occur when having a bowel movement or moving around. It is typically self-limited and usually will stop after a few minutes.  You may use some Vaseline or Neosporin around the tip of the catheter to reduce friction at the tip of the penis. You may also see some blood in the urine.  A very small amount of blood can make the urine look quite red.  As long as the catheter is draining well, there usually is not a problem.  However, if the catheter is not draining well and is bloody, you should call the office [phone]) to notify us.       5.   Please call if pain is not controlled with oral pain medication.

## 2019-02-04 NOTE — Interval H&P Note (Signed)
History and Physical Interval Note:  02/04/2019 8:56 AM  [name]  has presented today for surgery, with the diagnosis of LEFT URETERAL CALCULUS.  The various methods of treatment have been discussed with the patient and family. After consideration of risks, benefits and other options for treatment, the patient has consented to  Procedure(s) with comments: CYSTOSCOPY/RETROGRADE/URETEROSCOPY/HOLMIUM LASER/STENT PLACEMENT (Left) - ONLY NEEDS 60 MIN as a surgical intervention.  The patient's history has been reviewed, patient examined, no change in status, stable for surgery.  I have reviewed the patient's chart and labs.  Questions were answered to the patient's satisfaction.     [employer]

## 2019-02-04 NOTE — Anesthesia Procedure Notes (Signed)
Procedure Name: LMA Insertion Date/Time: 02/04/2019 10:17 AM Performed by: Niel Hummer, CRNA Pre-anesthesia Checklist: Patient identified, Emergency Drugs available, Suction available and Patient being monitored Patient Re-evaluated:Patient Re-evaluated prior to induction Oxygen Delivery Method: Circle system utilized Preoxygenation: Pre-oxygenation with 100% oxygen Induction Type: IV induction LMA: LMA inserted LMA Size: 5.0 Dental Injury: Teeth and Oropharynx as per pre-operative assessment

## 2019-02-04 NOTE — Consult Note (Signed)
Chief Complaint: Patient was seen in consultation today for renal calculi  Referring Physician(s): Dr. [name]  Supervising Physician: Sandi Mariscal  Patient Status: [facility] - In-pt  History of Present Illness: [name] is a 73 y.o. male with past medical history of dermatitis, vitiligo, migraines, BPH and kidney stones who is followed by Urology for left 1.0cm ureteral calculus.  Patient presented to [facility] today for stone removal, however due to his BPH and hematuria along with ureteral anatomy, his stone was unable to be accessed. Patient admitted for observation overnight tonight.  IR consulted for percutaneous left nephrostomy tube placement vs. Left nephroureteral stent placement.   Patient assessed at bedside.  He has some pelvic/bladder pain due to spasm and is aware of PRN medication for this if needed.  He is otherwise very talkative about recent medical news within his family.   He states he awoke from anesthesia feeling very depressed, but reports he is much better now.    Past Medical History:  Diagnosis Date  . Chronic kidney disease    RENAL STONES  . High frequency hearing loss   . History of migraine headaches   . Hx of colonic polyps   . Obesity   . Seborrheic dermatitis   . Vitiligo     Past Surgical History:  Procedure Laterality Date  . COLON SURGERY  01/2001  . FRACTURE SURGERY    . TONSILLECTOMY AND ADENOIDECTOMY      Allergies: Codeine  Medications: Prior to Admission medications   Not on File     History reviewed. No pertinent family history.  Social History   Socioeconomic History  . Marital status: Single    Spouse name: Not on file  . Number of children: Not on file  . Years of education: Not on file  . Highest education level: Not on file  Occupational History  . Not on file  Social Needs  . Financial resource strain: Not on file  . Food insecurity    Worry: Not on file    Inability: Not on file  . Transportation needs   Medical: Not on file    Non-medical: Not on file  Tobacco Use  . Smoking status: Former Smoker    Packs/day: 1.00    Years: 40.00    Pack years: 40.00    Types: Cigarettes    Quit date: 1997    Years since quitting: 23.7  . Smokeless tobacco: Never Used  Substance and Sexual Activity  . Alcohol use: Not Currently  . Drug use: Never  . Sexual activity: Not on file  Lifestyle  . Physical activity    Days per week: Not on file    Minutes per session: Not on file  . Stress: Not on file  Relationships  . Social Herbalist on phone: Not on file    Gets together: Not on file    Attends religious service: Not on file    Active member of club or organization: Not on file    Attends meetings of clubs or organizations: Not on file    Relationship status: Not on file  Other Topics Concern  . Not on file  Social History Narrative  . Not on file     Review of Systems: A 12 point ROS discussed and pertinent positives are indicated in the HPI above.  All other systems are negative.  Review of Systems  Constitutional: Negative for fatigue and fever.  Respiratory: Negative for cough and shortness  of breath.   Cardiovascular: Negative for chest pain.  Gastrointestinal: Negative for abdominal pain, nausea and vomiting.  Genitourinary: Positive for hematuria.  Musculoskeletal: Negative for back pain.  Psychiatric/Behavioral: Negative for behavioral problems and confusion.    Vital Signs: BP (!) 146/132 (BP Location: Right Arm)   Pulse 67   Temp (!) 97.5 F (36.4 C) (Axillary)   Resp 12   Ht 5\' 8"  (1.727 m)   Wt 284 lb 2.8 oz (128.9 kg)   SpO2 99%   BMI 43.21 kg/m   Physical Exam Vitals signs and nursing note reviewed.  Constitutional:      Appearance: Normal appearance.  HENT:     Mouth/Throat:     Mouth: Mucous membranes are moist.     Pharynx: Oropharynx is clear.  Cardiovascular:     Rate and Rhythm: Normal rate.  Pulmonary:     Effort: Pulmonary effort  is normal. No respiratory distress.     Breath sounds: Normal breath sounds.  Abdominal:     General: Abdomen is flat.     Palpations: Abdomen is soft.  Genitourinary:    Comments: Foley in place with bloody urine Skin:    General: Skin is warm and dry.  Neurological:     General: No focal deficit present.     Mental Status: He is alert and oriented to person, place, and time. Mental status is at baseline.  Psychiatric:        Mood and Affect: Mood normal.        Behavior: Behavior normal.        Thought Content: Thought content normal.        Judgment: Judgment normal.      MD Evaluation Airway: WNL Heart: WNL Abdomen: WNL Chest/ Lungs: WNL ASA  Classification: 3 Mallampati/Airway Score: Three   Imaging: Dg C-arm 1-60 Min-no Report  Result Date: 02/04/2019 Fluoroscopy was utilized by the requesting physician.  No radiographic interpretation.    Labs:  CBC: Recent Labs    01/31/19 1104  WBC 9.8  HGB 16.1  HCT 49.5  PLT 269    COAGS: Recent Labs    02/04/19 1354  INR 1.0    BMP: Recent Labs    01/31/19 1104  NA 136  K 4.7  CL 104  CO2 24  GLUCOSE 98  BUN 15  CALCIUM 9.0  CREATININE 0.94  GFRNONAA >60  GFRAA >60    LIVER FUNCTION TESTS: No results for input(s): BILITOT, AST, ALT, ALKPHOS, PROT, ALBUMIN in the last 8760 hours.  TUMOR MARKERS: No results for input(s): AFPTM, CEA, CA199, CHROMGRNA in the last 8760 hours.  Assessment and Plan: Renal calculi Patient with past medical history of kidney stones presents with 1 cm stone unable to be removed in OR today.  IR consulted for percutaneous nephrostomy tube at the request of Dr. [name]. Case reviewed by Dr. Pascal Lux who approves patient for procedure.  NPO p MN-- orders in place.  INR 1.0 this AM.  On Rocephin.  No thinners.  Risks and benefits of left PCN placement was discussed with the patient including, but not limited to, infection, bleeding, significant bleeding causing loss or  decrease in renal function or damage to adjacent structures.   All of the patient's questions were answered, patient is agreeable to proceed.  Consent signed and in chart.   Thank you for this interesting consult.  I greatly enjoyed meeting Hoke Fromm and look forward to participating in their care.  A copy of  this report was sent to the requesting provider on this date.  Electronically Signed: Docia Barrier, PA 02/04/2019, 4:38 PM   I spent a total of 40 Minutes    in face to face in clinical consultation, greater than 50% of which was counseling/coordinating care for renal calculi.

## 2019-02-04 NOTE — Plan of Care (Signed)
  Problem: Education: Goal: Knowledge of General Education information will improve Description: Including pain rating scale, medication(s)/side effects and non-pharmacologic comfort measures Outcome: Completed/Met

## 2019-02-04 NOTE — Anesthesia Preprocedure Evaluation (Addendum)
Anesthesia Evaluation  Patient identified by MRN, date of birth, ID band Patient awake    Reviewed: Allergy & Precautions, NPO status , Patient's Chart, lab work & pertinent test results  Airway Mallampati: II  TM Distance: >3 FB Neck ROM: Full    Dental  (+) Dental Advisory Given, Poor Dentition   Pulmonary neg pulmonary ROS, former smoker,  40 pack year history, quit 1997   Pulmonary exam normal breath sounds clear to auscultation       Cardiovascular negative cardio ROS Normal cardiovascular exam Rhythm:Regular Rate:Normal     Neuro/Psych  Headaches, negative psych ROS   GI/Hepatic Neg liver ROS, Colectomy 2002   Endo/Other  Morbid obesityMorbid obesity BMI 43  Renal/GU Renal diseasenephrolithiasis  negative genitourinary   Musculoskeletal negative musculoskeletal ROS (+)   Abdominal (+) + obese,   Peds negative pediatric ROS (+)  Hematology negative hematology ROS (+)   Anesthesia Other Findings HOH  Reproductive/Obstetrics negative OB ROS                          Anesthesia Physical Anesthesia Plan  ASA: III  Anesthesia Plan: General   Post-op Pain Management:    Induction: Intravenous  PONV Risk Score and Plan: 2  Airway Management Planned: LMA  Additional Equipment: None  Intra-op Plan:   Post-operative Plan: Extubation in OR  Informed Consent: I have reviewed the patients History and Physical, chart, labs and discussed the procedure including the risks, benefits and alternatives for the proposed anesthesia with the patient or authorized representative who has indicated his/her understanding and acceptance.     Dental advisory given  Plan Discussed with: CRNA  Anesthesia Plan Comments:        Anesthesia Quick Evaluation

## 2019-02-04 NOTE — Progress Notes (Signed)
Patient ID: [name], male   DOB: 10-16-1945, 73 y.o.   MRN: [id number]  Day of Surgery Subjective: Doing well.  Mild left flank pain.  Objective: Vital signs in last 24 hours: Temp:  [97.3 F (36.3 C)-97.7 F (36.5 C)] 97.5 F (36.4 C) (10/05 1430) Pulse Rate:  [54-89] 67 (10/05 1430) Resp:  [10-19] 12 (10/05 1307) BP: (131-168)/(73-132) 146/132 (10/05 1430) SpO2:  [95 %-100 %] 99 % (10/05 1430) Weight:  [128 kg-128.9 kg] 128.9 kg (10/05 1339)  Intake/Output from previous day: No intake/output data recorded. Intake/Output this shift: Total I/O In: 1356.6 [P.O.:240; I.V.:1066.6; IV Piggyback:50] Out: 57 [Urine:50]  Physical Exam:  General: Alert and oriented Abd: Mild left CVAT   Assessment/Plan: Attempted left ureteroscopic treatment of left renal stones today.  Was unsuccessful obtaining access due to enlarged prostate and J hooking of ureters.  Did identify submucosal injury to distal ureter and stopped procedure.  Discussed with Dr. Pascal Lux in IR.  Will check CT tomorrow morning.  Keep NPO after MN for possible left nephrostomy/nephroureteral catheter tomorrow per IR.  Appreciate Dr. Pascal Lux' help.   LOS: 0 days   [name] 02/04/2019, 4:29 PM

## 2019-02-04 NOTE — Anesthesia Postprocedure Evaluation (Signed)
Anesthesia Post Note  Patient: [name]  Procedure(s) Performed: CYSTOSCOPY Left retrograde and attempted left ureteroscopy (Left Ureter)     Patient location during evaluation: PACU Anesthesia Type: General Level of consciousness: awake and alert Pain management: pain level controlled Vital Signs Assessment: post-procedure vital signs reviewed and stable Respiratory status: spontaneous breathing, nonlabored ventilation, respiratory function stable and patient connected to nasal cannula oxygen Cardiovascular status: blood pressure returned to baseline and stable Postop Assessment: no apparent nausea or vomiting Anesthetic complications: no    Last Vitals:  Vitals:   02/04/19 1252 02/04/19 1307  BP:  137/73  Pulse: (!) 56 (!) 59  Resp: 10 12  Temp:  36.4 C  SpO2: 97% 100%    Last Pain:  Vitals:   02/04/19 1329  TempSrc:   PainSc: [place]

## 2019-02-04 NOTE — Op Note (Signed)
Preoperative diagnosis: Left renal calculi  Postoperative diagnosis: Left renal calculi  Procedures: 1.  Cystoscopy 2.  Left retrograde pyelography with interpretation 3.  Attempted left ureteroscopy  Surgeon: Pryor Curia MD  Anesthesia: General  Complications: Left ureteral false passage  EBL: Minimal  Specimens: None  Intraoperative findings: A left retrograde pyelogram was performed with a 6 French ureteral catheter and Omnipaque contrast.  This demonstrated severe J hooking of the distal ureter likely related to his extremely large prostate.  No intrinsic filling defects were identified.  No hydronephrosis was noted.  A large filling defect was noted in the renal pelvis consistent with his known left renal pelvic stone.  Indication: [name] is a 73 year old gentleman who has been followed for an elevated PSA and BPH. He recently presented with hematuria and underwent an evaluation that revealed a 1 cm left renal pelvic calculus along with additional nonobstructing renal calculi.  His stone was not visualized on KUB imaging.  We therefore reviewed options for management and he elected to proceed with ureteroscopic laser lithotripsy.  The potential risks, complications, and the expected recovery process were discussed in detail.  Informed consent was obtained.  Description of procedure: The patient was taken to the operating room and a general anesthetic was administered.  He was given preoperative antibiotics, placed in the dorsolithotomy position, and prepped and draped in the usual sterile fashion.  Next, a preoperative timeout was performed.  Cystourethroscopy was then performed which revealed a normal anterior urethra.  Inspection of the prostatic urethra revealed an extremely large prostate and median lobe requiring severe angulation of the cystoscope in order to be able to pass it into the bladder.  Inspection of the bladder revealed no bladder tumors, stones, or other  significant mucosal pathology aside from some mild trabeculation consistent with his history of BPH.  The left ureteral orifice was then identified.  This again required severe angulation of the rigid cystoscope to positioned appropriately.  A 6 French ureteral catheter was then inserted into the distal left ureter and Omnipaque contrast was injected to perform a retrograde pyelogram.  This revealed severe J hooking of the left distal ureter consistent with his very large prostate.  No intrinsic filling defects were identified within the ureter.  There was no hydronephrosis.  There was noted to be a filling defect within the renal pelvis consistent with the patient's known calculus.  An attempt was then made to place a 0.38 sensor guidewire into the left ureter.  However, resistance was met and the sensor guidewire was quickly switched out for a Glidewire.  Multiple attempts were then made to advance the Glidewire but were unsuccessful.  I then inserted the 6 French semirigid ureteroscope to see if I could find the opening to the ureter under direct vision.  At this point, there was already significant hematuria from his BPH.  I was able to advance the scope into the distal left ureter but after multiple attempts at identifying the ureteral lumen, I was unsuccessful in being able to access the lumen due to a noted false passage likely related to passage of the previous attempts with a wire.  I then inserted a digital flexible ureteroscope and still was unsuccessful in being able to identify the ureteral lumen.  At this point, visualization was quite poor.  I made 1 more attempt to identify the left ureteral orifice with the cystoscope using sterile water.  Again, visualization remained poor.  I then placed a sensor guidewire through  the cystoscope into the bladder and placed an 18 [place] council tip catheter over the wire due to the concern of ongoing hematuria from his BPH.  This was placed to drainage.  He will  be admitted for observation overnight.  I did discuss his case with interventional radiology.  Currently, we will plan to perform a repeat noncontrast CT scan in the morning to assess for development of hydronephrosis and for planning purposes for interventional radiology.  Tentatively, he will be scheduled for nephrostomy or hopefully nephroureteral catheter placement tomorrow.  We will then proceed with definitive treatment of his stones at a later date either antegrade or retrograde following further discussion.

## 2019-02-05 ENCOUNTER — Observation Stay (HOSPITAL_COMMUNITY): Payer: Medicare Other

## 2019-02-05 ENCOUNTER — Encounter (HOSPITAL_COMMUNITY): Payer: Self-pay | Admitting: Urology

## 2019-02-05 DIAGNOSIS — N202 Calculus of kidney with calculus of ureter: Secondary | ICD-10-CM | POA: Diagnosis present

## 2019-02-05 DIAGNOSIS — D72829 Elevated white blood cell count, unspecified: Secondary | ICD-10-CM | POA: Diagnosis not present

## 2019-02-05 DIAGNOSIS — R739 Hyperglycemia, unspecified: Secondary | ICD-10-CM | POA: Diagnosis present

## 2019-02-05 DIAGNOSIS — Y848 Other medical procedures as the cause of abnormal reaction of the patient, or of later complication, without mention of misadventure at the time of the procedure: Secondary | ICD-10-CM | POA: Diagnosis present

## 2019-02-05 DIAGNOSIS — Z87891 Personal history of nicotine dependence: Secondary | ICD-10-CM | POA: Diagnosis not present

## 2019-02-05 DIAGNOSIS — N401 Enlarged prostate with lower urinary tract symptoms: Secondary | ICD-10-CM | POA: Diagnosis present

## 2019-02-05 DIAGNOSIS — Y92234 Operating room of hospital as the place of occurrence of the external cause: Secondary | ICD-10-CM | POA: Diagnosis present

## 2019-02-05 DIAGNOSIS — Z6841 Body Mass Index (BMI) 40.0 and over, adult: Secondary | ICD-10-CM | POA: Diagnosis not present

## 2019-02-05 DIAGNOSIS — Z8042 Family history of malignant neoplasm of prostate: Secondary | ICD-10-CM | POA: Diagnosis not present

## 2019-02-05 DIAGNOSIS — Z8719 Personal history of other diseases of the digestive system: Secondary | ICD-10-CM | POA: Diagnosis not present

## 2019-02-05 DIAGNOSIS — N3941 Urge incontinence: Secondary | ICD-10-CM | POA: Diagnosis present

## 2019-02-05 DIAGNOSIS — T80818A Extravasation of other vesicant agent, initial encounter: Secondary | ICD-10-CM | POA: Diagnosis present

## 2019-02-05 DIAGNOSIS — G9009 Other idiopathic peripheral autonomic neuropathy: Secondary | ICD-10-CM | POA: Diagnosis present

## 2019-02-05 DIAGNOSIS — Z87442 Personal history of urinary calculi: Secondary | ICD-10-CM | POA: Diagnosis not present

## 2019-02-05 DIAGNOSIS — Z885 Allergy status to narcotic agent status: Secondary | ICD-10-CM | POA: Diagnosis not present

## 2019-02-05 DIAGNOSIS — Z9049 Acquired absence of other specified parts of digestive tract: Secondary | ICD-10-CM | POA: Diagnosis not present

## 2019-02-05 DIAGNOSIS — N200 Calculus of kidney: Secondary | ICD-10-CM | POA: Diagnosis present

## 2019-02-05 DIAGNOSIS — R110 Nausea: Secondary | ICD-10-CM | POA: Diagnosis not present

## 2019-02-05 HISTORY — PX: IR NEPHROSTOMY PLACEMENT LEFT: IMG6063

## 2019-02-05 LAB — BASIC METABOLIC PANEL
Anion gap: 7 (ref 5–15)
BUN: 16 mg/dL (ref 8–23)
CO2: 26 mmol/L (ref 22–32)
Calcium: 8.8 mg/dL — ABNORMAL LOW (ref 8.9–10.3)
Chloride: 105 mmol/L (ref 98–111)
Creatinine, Ser: 1.12 mg/dL (ref 0.61–1.24)
GFR calc Af Amer: >60 mL/min (ref >60)
GFR calc non Af Amer: >60 mL/min (ref >60)
Glucose, Bld: 152 mg/dL — ABNORMAL HIGH (ref 70–99)
Potassium: 4.9 mmol/L (ref 3.5–5.1)
Sodium: 138 mmol/L (ref 135–145)

## 2019-02-05 LAB — CBC
HCT: 45.2 % (ref 39.0–52.0)
Hemoglobin: 14.6 g/dL (ref 13.0–17.0)
MCH: 29 pg (ref 26.0–34.0)
MCHC: 32.3 g/dL (ref 30.0–36.0)
MCV: 89.9 fL (ref 80.0–100.0)
Platelets: 265 10*3/uL (ref 150–400)
RBC: 5.03 MIL/uL (ref 4.22–5.81)
RDW: 13.3 % (ref 11.5–15.5)
WBC: 14.5 10*3/uL — ABNORMAL HIGH (ref 4.0–10.5)
nRBC: 0 % (ref 0.0–0.2)

## 2019-02-05 MED ORDER — MIDAZOLAM HCL 2 MG/2ML IJ SOLN
INTRAMUSCULAR | Status: AC
  Filled 2019-02-05: qty 4

## 2019-02-05 MED ORDER — LIDOCAINE HCL 1 % IJ SOLN
INTRAMUSCULAR | Status: AC | PRN
  Administered 2019-02-05: 10 mL

## 2019-02-05 MED ORDER — MIDAZOLAM HCL 2 MG/2ML IJ SOLN
INTRAMUSCULAR | Status: AC
  Filled 2019-02-05: qty 2

## 2019-02-05 MED ORDER — LIDOCAINE HCL 1 % IJ SOLN
INTRAMUSCULAR | Status: AC
  Filled 2019-02-05: qty 20

## 2019-02-05 MED ORDER — CHLORHEXIDINE GLUCONATE CLOTH 2 % EX PADS
6.0000 | MEDICATED_PAD | Freq: Every day | CUTANEOUS | Status: DC
  Administered 2019-02-05: 6 via TOPICAL

## 2019-02-05 MED ORDER — IOHEXOL 300 MG/ML  SOLN
50.0000 mL | Freq: Once | INTRAMUSCULAR | Status: AC | PRN
  Administered 2019-02-05: 50 mL via INTRAVENOUS

## 2019-02-05 MED ORDER — IOHEXOL 300 MG/ML  SOLN
50.0000 mL | Freq: Once | INTRAMUSCULAR | Status: AC | PRN
  Administered 2019-02-05: 18:00:00 50 mL

## 2019-02-05 MED ORDER — MIDAZOLAM HCL 2 MG/2ML IJ SOLN
INTRAMUSCULAR | Status: AC | PRN
  Administered 2019-02-05: 1 mg via INTRAVENOUS

## 2019-02-05 MED ORDER — MIDAZOLAM HCL 5 MG/5ML IJ SOLN
INTRAMUSCULAR | Status: AC | PRN
  Administered 2019-02-05: 1 mg via INTRAVENOUS

## 2019-02-05 MED ORDER — OXYCODONE-ACETAMINOPHEN 5-325 MG PO TABS
1.0000 | ORAL_TABLET | Freq: Four times a day (QID) | ORAL | Status: DC | PRN
  Administered 2019-02-06 (×3): 2 via ORAL
  Filled 2019-02-05 (×3): qty 2

## 2019-02-05 MED ORDER — FENTANYL CITRATE (PF) 100 MCG/2ML IJ SOLN
INTRAMUSCULAR | Status: AC | PRN
  Administered 2019-02-05 (×2): 50 ug via INTRAVENOUS

## 2019-02-05 MED ORDER — TRAMADOL HCL 50 MG PO TABS
50.0000 mg | ORAL_TABLET | Freq: Four times a day (QID) | ORAL | Status: DC | PRN
  Administered 2019-02-05: 21:00:00 50 mg via ORAL
  Filled 2019-02-05: qty 1

## 2019-02-05 MED ORDER — IOHEXOL 300 MG/ML  SOLN
50.0000 mL | Freq: Once | INTRAMUSCULAR | Status: AC | PRN
  Administered 2019-02-05: 5 mL

## 2019-02-05 MED ORDER — FENTANYL CITRATE (PF) 100 MCG/2ML IJ SOLN
INTRAMUSCULAR | Status: AC
  Filled 2019-02-05: qty 2

## 2019-02-05 MED ORDER — HYDROMORPHONE HCL 1 MG/ML IJ SOLN
0.5000 mg | INTRAMUSCULAR | Status: DC | PRN
  Administered 2019-02-05: 0.5 mg via INTRAVENOUS
  Filled 2019-02-05: qty 0.5

## 2019-02-05 NOTE — Sedation Documentation (Signed)
Vital signs stable. Procedure continues. Pt is resting at this time with no compaints

## 2019-02-05 NOTE — Sedation Documentation (Signed)
Pt is able to follow breathing directions. Images taken

## 2019-02-05 NOTE — Procedures (Signed)
Pre Procedure Dx: Nephrolithiasis Post Procedural Dx: Same  Attempted thought ultimately unsuccessful fluoroscopic guided placement of a left sided PCN d/t lack of any left sided hydronephrosis and complicated by pt's extreme obesity.   EBL: Minimal Complications: Extravasation of contrast about the pelvic cannulation site.  Ronny Bacon, MD Pager #: [phone]

## 2019-02-05 NOTE — Sedation Documentation (Signed)
Patient is resting comfortably. 

## 2019-02-05 NOTE — Sedation Documentation (Signed)
Procedure aborted. Pt remains stable

## 2019-02-05 NOTE — Sedation Documentation (Signed)
Pt tolerated procedure well. Pt assisted self bac kto bed and assisted with pulling self up in bed with grab bar. Pt states that he is more comfortable now in his bed. Report given to [place], [place]

## 2019-02-05 NOTE — Sedation Documentation (Signed)
Patient is resting comfortably. No complaints at this time 

## 2019-02-05 NOTE — Sedation Documentation (Signed)
Vital signs stable. 

## 2019-02-05 NOTE — Sedation Documentation (Signed)
Patient is resting comfortably. Procedure started °

## 2019-02-05 NOTE — Progress Notes (Signed)
Patient ID: [name], male   DOB: 28-Aug-1945, 73 y.o.   MRN: [id number]  1 Day Post-Op Subjective: Pt doing well overall.  One episode of clot retention last night relieved with hand irrigation.  Denies left flank pain.  Objective: Vital signs in last 24 hours: Temp:  [97.3 F (36.3 C)-98 F (36.7 C)] 97.7 F (36.5 C) (10/06 0444) Pulse Rate:  [54-89] 57 (10/06 0444) Resp:  [10-20] 18 (10/06 0444) BP: (124-168)/(73-132) 124/73 (10/06 0444) SpO2:  [95 %-100 %] 98 % (10/06 0444) Weight:  [128 kg-128.9 kg] 128.9 kg (10/05 1339)  Intake/Output from previous day: 10/05 0701 - 10/06 0700 In: 2798.9 [P.O.:720; I.V.:1928.9; IV Piggyback:150] Out: 1900 [Urine:1900] Intake/Output this shift: No intake/output data recorded.  Physical Exam:  General: Alert and oriented Abd: No CVAT or abdominal tenderness GU: Urine clear  Lab Results: Recent Labs    02/05/19 0513  HGB 14.6  HCT 45.2   BMET Recent Labs    02/05/19 0513  NA 138  K 4.9  CL 105  CO2 26  GLUCOSE 152*  BUN 16  CREATININE 1.12  CALCIUM 8.8*     Studies/Results:   Assessment/Plan: 1) Left renal calculi with inability to access left ureter in retrograde fashion: Plan for CT this morning and tentative procedure in IR for left nephrostomy and hopefully nephroureteral catheter if possible. Will leave Foley until IR procedure.  Will then consider voiding trial and discharge home after procedure if appropriate and depending on timing of procedure.   LOS: 0 days   [name] 02/05/2019, 7:51 AM

## 2019-02-05 NOTE — Progress Notes (Signed)
B&O suppository was ineffective to control pt's bladder spasms. At this time urine was flowing red from foley catheter. Paged Graylon Good, MD and received order for ditropan 5mg  one time. This was ineffective as well and pt's foley had become occluded and stopped draining. Paged Graylon Good, MD and received order to irrigate catheter for clots as needed. This RN irrigated 60cc of saline through foley catheter, withdrawing one large blood clot. Urine now flowing, yellow in color. Patient resting, will continue to monitor.

## 2019-02-06 ENCOUNTER — Inpatient Hospital Stay (HOSPITAL_COMMUNITY): Payer: Medicare Other

## 2019-02-06 LAB — CBC
HCT: 45.7 % (ref 39.0–52.0)
Hemoglobin: 14.8 g/dL (ref 13.0–17.0)
MCH: 29.2 pg (ref 26.0–34.0)
MCHC: 32.4 g/dL (ref 30.0–36.0)
MCV: 90.3 fL (ref 80.0–100.0)
Platelets: 273 10*3/uL (ref 150–400)
RBC: 5.06 MIL/uL (ref 4.22–5.81)
RDW: 13.6 % (ref 11.5–15.5)
WBC: 16.3 10*3/uL — ABNORMAL HIGH (ref 4.0–10.5)
nRBC: 0 % (ref 0.0–0.2)

## 2019-02-06 LAB — BASIC METABOLIC PANEL
Anion gap: 10 (ref 5–15)
Anion gap: 8 (ref 5–15)
BUN: 22 mg/dL (ref 8–23)
BUN: 24 mg/dL — ABNORMAL HIGH (ref 8–23)
CO2: 23 mmol/L (ref 22–32)
CO2: 25 mmol/L (ref 22–32)
Calcium: 8.8 mg/dL — ABNORMAL LOW (ref 8.9–10.3)
Calcium: 8.7 mg/dL — ABNORMAL LOW (ref 8.9–10.3)
Chloride: 104 mmol/L (ref 98–111)
Chloride: 106 mmol/L (ref 98–111)
Creatinine, Ser: 1.38 mg/dL — ABNORMAL HIGH (ref 0.61–1.24)
Creatinine, Ser: 1.31 mg/dL — ABNORMAL HIGH (ref 0.61–1.24)
GFR calc Af Amer: 58 mL/min — ABNORMAL LOW (ref >60)
GFR calc Af Amer: >60 mL/min (ref >60)
GFR calc non Af Amer: 50 mL/min — ABNORMAL LOW (ref >60)
GFR calc non Af Amer: 53 mL/min — ABNORMAL LOW (ref >60)
Glucose, Bld: 141 mg/dL — ABNORMAL HIGH (ref 70–99)
Glucose, Bld: 147 mg/dL — ABNORMAL HIGH (ref 70–99)
Potassium: 4.4 mmol/L (ref 3.5–5.1)
Potassium: 3.8 mmol/L (ref 3.5–5.1)
Sodium: 137 mmol/L (ref 135–145)
Sodium: 139 mmol/L (ref 135–145)

## 2019-02-06 MED ORDER — OXYCODONE-ACETAMINOPHEN 5-325 MG PO TABS: 1.0000 | ORAL_TABLET | ORAL | 0 refills | Status: DC | PRN

## 2019-02-06 MED ORDER — CEFDINIR 300 MG PO CAPS: 300.0000 mg | ORAL_CAPSULE | Freq: Two times a day (BID) | ORAL | 0 refills | Status: DC

## 2019-02-06 NOTE — Discharge Summary (Signed)
  Date of admission: 02/04/2019  Date of discharge: 02/06/2019  Admission diagnosis: Left renal calculi, BPH  Discharge diagnosis: Left renal calculi, BPH  History and Physical: For full details, please see admission history and physical. Briefly, [name] is a 73 y.o. year old patient with left renal calculi and a history of BPH (200 cc).   Hospital Course: He underwent an attempted left ureteroscopic treatment of his left renal stones.  However, I was unable to access his ureter in a retrograde fashion due to his extremely large prostate.  I therefore stopped the procedure.  He was admitted overnight and was taken to IR the following day for attempted nephrostomy placement.  This was again unsuccessful due the patient's body habitus and no hydronephrosis.  The following day, his Cr was slightly elevated and he was having left flank pain.  A renal ultrasound was obtained and he still had no hydronephrosis developing.  His pain was controlled and his renal function stabilized.  He was discharged home.  Laboratory values:  Recent Labs    02/05/19 0513 02/06/19 0541  HGB 14.6 14.8  HCT 45.2 45.7   Recent Labs    02/06/19 0541 02/06/19 1342  CREATININE 1.38* 1.31*    Disposition: Home  Discharge instruction: The patient was instructed to be ambulatory but told to refrain from heavy lifting, strenuous activity, or driving.  Discharge medications:  Allergies as of 02/06/2019      Reactions   Codeine    Bad Headache      Medication List    TAKE these medications   oxyCODONE-acetaminophen 5-325 MG tablet Commonly known as: Percocet Take 1 tablet by mouth every 4 (four) hours as needed for severe pain.       Followup:  Follow-up Information    Raynelle Bring, MD Follow up.   Specialty: Urology Why: Will call to schedule. Contact information: [place] [address] [phone]         Will f/u next week with repeat renal imaging.

## 2019-02-06 NOTE — Progress Notes (Signed)
Pt brought to RN's attention that left hand and forearm are "tight"-IV infiltrated.Radial pulses present. Both hands cold with dusky colored finger nails. Pt states that's "normal". IV d/c'd and IV team to reinsert in opposite arm. Arm elevated and will monitor. Eulas Post, RN

## 2019-02-06 NOTE — Progress Notes (Signed)
Patient ID: [name], male   DOB: 16-Sep-1945, 73 y.o.   MRN: [id number]  2 Days Post-Op Subjective: Pain still present but controlled with oral medication.  Still with incontinent episodes today.  Objective: Vital signs in last 24 hours: Temp:  [97.6 F (36.4 C)-97.9 F (36.6 C)] 97.7 F (36.5 C) (10/07 1412) Pulse Rate:  [57-73] 66 (10/07 1412) Resp:  [10-22] 18 (10/07 1412) BP: (130-168)/(69-109) 145/74 (10/07 1412) SpO2:  [95 %-100 %] 100 % (10/07 1412)  Intake/Output from previous day: 10/06 0701 - 10/07 0700 In: 2215.8 [P.O.:150; I.V.:1965.8; IV Piggyback:100] Out: 1300 [Urine:1300] Intake/Output this shift: Total I/O In: -  Out: 175 [Urine:175]  Physical Exam:  General: Alert and oriented  Lab Results: Recent Labs    02/05/19 0513 02/06/19 0541  HGB 14.6 14.8  HCT 45.2 45.7   BMET Recent Labs    02/06/19 0541 02/06/19 1342  NA 137 139  K 4.4 3.8  CL 104 106  CO2 23 25  GLUCOSE 141* 147*  BUN 22 24*  CREATININE 1.38* 1.31*  CALCIUM 8.8* 8.7*     Studies/Results:  US Renal  Result Date: 02/06/2019 CLINICAL DATA:  73 year old male with nephrolithiasis. Unsuccessful left-side stone extraction and percutaneous nephrostomy tube placement. EXAM: RENAL / URINARY TRACT ULTRASOUND COMPLETE COMPARISON:  Noncontrast CT Abdomen and Pelvis 02/05/2019. FINDINGS: Right Kidney: Renal measurements: 12.8 x 7.1 x 6.3 centimeters = volume: 299 mL. Renal cortical thinning with preserved cortical echogenicity. No hydronephrosis. Questionable small volume perinephric fluid (image 10). No right nephrolithiasis is evident by ultrasound. Left Kidney: Renal measurements: 12.6 x 6.2 x 4.8 centimeters = volume: 198 mL. Suggestion of what appears to be a stent or catheter along the lower pole of the left kidney on image 30. This is in the region of a left renal lower pole cyst (image 31). No superimposed hydronephrosis. Similar left renal cortical thinning to that on the right.  Evidence of some left pararenal fluid on image 49. Bladder: Decompressed, not visible. IMPRESSION: 1. No hydronephrosis. Renal calculi on CT yesterday are not evident by ultrasound. 2. Questionable catheter or stent along the lower pole of the left kidney and may be associated with a 2.8 cm lower pole cyst. See images 30 and 31. 3. Small volume bilateral perinephric fluid suspected. Electronically Signed   By: Genevie Ann M.D.   On: 02/06/2019 10:50    Bladder scan: 37 cc  Assessment/Plan: Renal function stable and he is feeling better.  Incontinence likely urge incontinence with no evidence of retention/overflow incontinence.  Will d/c home today with plans to f/u next week with repeat imaging.   LOS: 1 day   [name] 02/06/2019, 4:50 PM

## 2019-02-06 NOTE — Progress Notes (Addendum)
Patient ID: [name], male   DOB: 06-26-1945, 73 y.o.   MRN: [id number]  2 Days Post-Op Subjective: Attempt at left nephrostomy yesterday unsuccessful.  No hydronephrosis on CT and initial plan was to observe him and repeat imaging as outpatient next week.  Pt with increased left flank pain overnight.  He has been voiding since catheter removal.  Passed a few clots.  Nausea last night. Currently pain better with pain medication.  Objective: Vital signs in last 24 hours: Temp:  [97.6 F (36.4 C)-98.2 F (36.8 C)] 97.9 F (36.6 C) (10/07 0459) Pulse Rate:  [52-73] 73 (10/07 0459) Resp:  [10-22] 18 (10/07 0459) BP: (124-168)/(69-109) 140/69 (10/07 0459) SpO2:  [95 %-100 %] 95 % (10/07 0459)  Intake/Output from previous day: 10/06 0701 - 10/07 0700 In: 2215.8 [P.O.:150; I.V.:1965.8; IV Piggyback:100] Out: 1300 [Urine:1300] Intake/Output this shift: Total I/O In: 2215.8 [P.O.:150; I.V.:1965.8; IV Piggyback:100] Out: 650 [Urine:650]  Physical Exam:  General: Alert and oriented Abd: Moderate left CVAT  Lab Results: Recent Labs    02/05/19 0513 02/06/19 0541  HGB 14.6 14.8  HCT 45.2 45.7   BMET Recent Labs    02/05/19 0513 02/06/19 0541  NA 138 137  K 4.9 4.4  CL 105 104  CO2 26 23  GLUCOSE 152* 141*  BUN 16 22  CREATININE 1.12 1.38*  CALCIUM 8.8* 8.8*     Studies/Results: Ct Abdomen Pelvis Wo Contrast  Result Date: 02/05/2019 CLINICAL DATA:  Flank pain. Recurrent stone disease. Suspected left renal calculus and distal ureteric obstruction. Hematuria. Unsuccessful stone extraction. EXAM: CT ABDOMEN AND PELVIS WITHOUT CONTRAST TECHNIQUE: Multidetector CT imaging of the abdomen and pelvis was performed following the standard protocol without IV contrast. COMPARISON:  None. FINDINGS: Lower chest: Left lower lobe scarring. Normal heart size without pericardial or pleural effusion. Multivessel coronary artery atherosclerosis. Tiny hiatal hernia. Hepatobiliary:  Moderate hepatic steatosis. Multiple small gallstones without acute cholecystitis or biliary duct dilatation. Pancreas: Normal, without mass or ductal dilatation. Spleen: Normal in size, without focal abnormality. Adrenals/Urinary Tract: Normal adrenal glands. Left larger than right renal collecting system calculi, maximally 6 mm. Moderate bilateral renal cortical thinning. Bilateral low-density renal lesions which are likely cysts. An interpolar right renal lesion of 2.1 cm on 42/2 demonstrates probable complexity. A left renal pelvic stone of 7 mm. No hydronephrosis. No hydroureter or ureteric calculi. Urinary bladder decompressed around a Foley catheter. No bladder calculi. Stomach/Bowel: Normal remainder of the stomach. Scattered colonic diverticula. Minimal nonobstructive small bowel protruding through a tiny left paramidline upper pelvic ventral wall hernia, including on 66/2. Vascular/Lymphatic: Aortic and branch vessel atherosclerosis. No abdominopelvic adenopathy. Reproductive: Moderate to marked prostatomegaly. Other: No significant free fluid. Small, right larger than left fat containing inguinal hernias. Musculoskeletal: Lower thoracic spondylosis 3. IMPRESSION: 1. Bilateral nephrolithiasis, including a left renal pelvic 7 mm stone. No obstructive uropathy. 2. Prostatomegaly. 3. Coronary artery atherosclerosis. 4. Hepatic steatosis. 5. Cholelithiasis. 6. Aortic Atherosclerosis (ICD10-I70.0). 7. Small nonobstructive small bowel containing left paramidline ventral pelvic wall hernia. Electronically Signed   By: Abigail Miyamoto M.D.   On: 02/05/2019 08:57   Dg C-arm 1-60 Min-no Report  Result Date: 02/04/2019 Fluoroscopy was utilized by the requesting physician.  No radiographic interpretation.   Ir Nephrostomy Placement Left  Result Date: 02/05/2019 INDICATION: History of left-sided nephrolithiasis post attempted left-sided ureteroscopic stone retraction however this ultimately proved unsuccessful  secondary to patient's markedly enlarged prostate gland, now with concern for ureteral injury. As such, request made for placement of  a left-sided percutaneous nephrostomy catheter for the purposes of both urinary diversion as well as to potentially provide a percutaneous access for potential future percutaneous nephrolithotomy. EXAM: ATTEMPTED FLUOROSCOPIC GUIDED PLACEMENT LEFT-SIDED PERCUTANEOUS NEPHROSTOMY CATHETER. COMPARISON:  CT abdomen and pelvis - earlier same day MEDICATIONS: The patient is currently admitted to the hospital receiving intravenous antibiotics; the antibiotic was administered in an appropriate time frame prior to skin puncture. ANESTHESIA/SEDATION: Moderate (conscious) sedation was employed during this procedure. A total of Versed 2 mg and Fentanyl 100 mcg was administered intravenously. Moderate Sedation Time: 50 minutes. The patient's level of consciousness and vital signs were monitored continuously by radiology nursing throughout the procedure under my direct supervision. CONTRAST:  100 cc Omnipaque 300 was administered intravenously. Approximately 50 cc of Omnipaque 300 was administered during attempted fluoroscopic guided cannulation of the left renal collecting system. FLUOROSCOPY TIME:  20 minutes, 6 seconds (1,642 mGy). COMPLICATIONS: None immediate. PROCEDURE: The procedure, risks, benefits, and alternatives were explained to the patient. Questions regarding the procedure were encouraged and answered. The patient understands and consents to the procedure. A timeout was performed prior to the initiation of the procedure. The left flank region was prepped with Betadine in a sterile fashion, and a sterile drape was applied covering the operative field. A sterile gown and sterile gloves were used for the procedure. Sonographic evaluation was performed of the left flank however demonstrated no significant left-sided pelvicaliectasis. Additionally, note was made of a great depth of the  kidney to the skin surface of at least 10 cm. Preprocedural spot fluoroscopic image was obtained of the left flank demonstrating ill-defined opacities overlying the superior and mid pole the left kidney as well as an ill-defined opacity overlying expected location of the left UVJ compatible with known left-sided nephrolithiasis Contrast was administered peripherally demonstrating faint opacification the left renal collecting system and again without any significant pelvicaliectasis. Prolonged efforts were made to cannulate a non dilated posteroinferior calyx under fluoroscopic guided however this ultimately proved unsuccessful As such, the left renal pelvis was ultimately targeted with a 22 gauge needle. Contrast injection ultimately confirmed intrapelvic puncture. A Nitrex wire was cannulated within the left renal pelvis allowing placement of a the 3 [place] inner catheter from the [place] set. This allowed for further opacification of the left renal collecting system however note was made of extravasation about the pelvic cannulation site. Next, prolonged efforts were made to cannulate a non dilated posteroinferior calyx and while successful cannulation with a Nitrex wire was achieved, the [place] set could not be advanced into the nondilated collecting system likely attributable to patient body habitus and depth of the kidney. At this point, the initial access to the left renal pelvis was inadvertently withdrawn outside the pelvis with a large amount of extravasation about the left renal pelvis. At this 0.2 incision was made to abandon additional percutaneous access at this time. FINDINGS: Ultrasound scanning demonstrated no significant left-sided pelvicaliectasis as well as an extreme depth of the left kidney in relation to the skin surface. As above, intravenous contrast was administered however there is no significant left-sided pelvicaliectasis Despite cannulation of left renal pelvis to allow for further  contrast opacification of the collecting system, a peripheral calyx could not be successfully cannulated and ultimately procedure was aborted secondary to contrast extravasation about the left renal pelvis due to inadvertent retraction of the pelvic catheter due to patient body habitus. Completion image demonstrates potential migration of the left ureteral stone to the level of the mid/distal left  ureter. IMPRESSION: Attempted though ultimately unsuccessful left-sided percutaneous nephrostomy catheter placement secondary to lack of dilatation of left renal collecting system and patient's extreme obesity. Above findings discussed with referring urologist, Dr. [name], at the time of procedure completion. Electronically Signed   By: Sandi Mariscal M.D.   On: 02/05/2019 18:51    Assessment/Plan: 1) Left renal calculi: Unable to access left renal collecting system in retrograde or antegrade fashion due to patient's anatomy and body habitus.  Now with increasing Cr and development of left flank pain.  Will check renal ultrasound today to see if he is developing hydronephrosis and, if so, it may be worth another attempt at nephrostomy placement.  Will recheck renal function later today. 2) Leukocytosis: May be related to procedure yesterday but will continue ceftriaxone and monitor for fever, etc.   LOS: 1 day   [name] 02/06/2019, 6:59 AM

## 2019-05-20 ENCOUNTER — Other Ambulatory Visit (HOSPITAL_COMMUNITY): Payer: Self-pay | Admitting: Urology

## 2019-05-20 ENCOUNTER — Other Ambulatory Visit: Payer: Self-pay | Admitting: Urology

## 2019-05-20 DIAGNOSIS — N201 Calculus of ureter: Secondary | ICD-10-CM

## 2019-08-04 ENCOUNTER — Emergency Department (HOSPITAL_COMMUNITY): Payer: Medicare Other

## 2019-08-04 ENCOUNTER — Encounter (HOSPITAL_COMMUNITY): Payer: Self-pay

## 2019-08-04 ENCOUNTER — Inpatient Hospital Stay (HOSPITAL_COMMUNITY)
Admission: EM | Admit: 2019-08-04 | Discharge: 2019-08-13 | DRG: 856 | Disposition: A | Discharge status: Home / Self Care | Payer: Medicare Other | Attending: Family Medicine | Admitting: Family Medicine

## 2019-08-04 ENCOUNTER — Other Ambulatory Visit: Payer: Self-pay

## 2019-08-04 DIAGNOSIS — R7989 Other specified abnormal findings of blood chemistry: Secondary | ICD-10-CM

## 2019-08-04 DIAGNOSIS — N200 Calculus of kidney: Secondary | ICD-10-CM

## 2019-08-04 DIAGNOSIS — I272 Pulmonary hypertension, unspecified: Secondary | ICD-10-CM | POA: Diagnosis present

## 2019-08-04 DIAGNOSIS — R778 Other specified abnormalities of plasma proteins: Secondary | ICD-10-CM

## 2019-08-04 DIAGNOSIS — I2510 Atherosclerotic heart disease of native coronary artery without angina pectoris: Secondary | ICD-10-CM | POA: Diagnosis not present

## 2019-08-04 DIAGNOSIS — I25110 Atherosclerotic heart disease of native coronary artery with unstable angina pectoris: Secondary | ICD-10-CM | POA: Diagnosis not present

## 2019-08-04 DIAGNOSIS — R0682 Tachypnea, not elsewhere classified: Secondary | ICD-10-CM

## 2019-08-04 DIAGNOSIS — Z87891 Personal history of nicotine dependence: Secondary | ICD-10-CM

## 2019-08-04 DIAGNOSIS — N201 Calculus of ureter: Secondary | ICD-10-CM | POA: Diagnosis not present

## 2019-08-04 DIAGNOSIS — H9190 Unspecified hearing loss, unspecified ear: Secondary | ICD-10-CM | POA: Diagnosis present

## 2019-08-04 DIAGNOSIS — Z20822 Contact with and (suspected) exposure to covid-19: Secondary | ICD-10-CM | POA: Diagnosis present

## 2019-08-04 DIAGNOSIS — I130 Hypertensive heart and chronic kidney disease with heart failure and stage 1 through stage 4 chronic kidney disease, or unspecified chronic kidney disease: Secondary | ICD-10-CM | POA: Diagnosis present

## 2019-08-04 DIAGNOSIS — E785 Hyperlipidemia, unspecified: Secondary | ICD-10-CM | POA: Diagnosis present

## 2019-08-04 DIAGNOSIS — E876 Hypokalemia: Secondary | ICD-10-CM | POA: Diagnosis present

## 2019-08-04 DIAGNOSIS — Z885 Allergy status to narcotic agent status: Secondary | ICD-10-CM

## 2019-08-04 DIAGNOSIS — I5041 Acute combined systolic (congestive) and diastolic (congestive) heart failure: Secondary | ICD-10-CM | POA: Diagnosis not present

## 2019-08-04 DIAGNOSIS — R6520 Severe sepsis without septic shock: Secondary | ICD-10-CM | POA: Diagnosis present

## 2019-08-04 DIAGNOSIS — I959 Hypotension, unspecified: Secondary | ICD-10-CM | POA: Diagnosis not present

## 2019-08-04 DIAGNOSIS — I361 Nonrheumatic tricuspid (valve) insufficiency: Secondary | ICD-10-CM | POA: Diagnosis not present

## 2019-08-04 DIAGNOSIS — Z87442 Personal history of urinary calculi: Secondary | ICD-10-CM

## 2019-08-04 DIAGNOSIS — N400 Enlarged prostate without lower urinary tract symptoms: Secondary | ICD-10-CM | POA: Diagnosis present

## 2019-08-04 DIAGNOSIS — N99528 Other complication of other external stoma of urinary tract: Secondary | ICD-10-CM

## 2019-08-04 DIAGNOSIS — Z6841 Body Mass Index (BMI) 40.0 and over, adult: Secondary | ICD-10-CM | POA: Diagnosis not present

## 2019-08-04 DIAGNOSIS — N10 Acute tubulo-interstitial nephritis: Secondary | ICD-10-CM

## 2019-08-04 DIAGNOSIS — E872 Acidosis: Secondary | ICD-10-CM | POA: Diagnosis not present

## 2019-08-04 DIAGNOSIS — N183 Chronic kidney disease, stage 3 unspecified: Secondary | ICD-10-CM | POA: Diagnosis present

## 2019-08-04 DIAGNOSIS — I214 Non-ST elevation (NSTEMI) myocardial infarction: Secondary | ICD-10-CM | POA: Diagnosis not present

## 2019-08-04 DIAGNOSIS — A419 Sepsis, unspecified organism: Secondary | ICD-10-CM | POA: Diagnosis present

## 2019-08-04 DIAGNOSIS — N136 Pyonephrosis: Secondary | ICD-10-CM | POA: Diagnosis present

## 2019-08-04 DIAGNOSIS — J9601 Acute respiratory failure with hypoxia: Secondary | ICD-10-CM | POA: Diagnosis not present

## 2019-08-04 DIAGNOSIS — I161 Hypertensive emergency: Secondary | ICD-10-CM

## 2019-08-04 DIAGNOSIS — I255 Ischemic cardiomyopathy: Secondary | ICD-10-CM | POA: Diagnosis present

## 2019-08-04 DIAGNOSIS — N132 Hydronephrosis with renal and ureteral calculous obstruction: Secondary | ICD-10-CM

## 2019-08-04 DIAGNOSIS — R600 Localized edema: Secondary | ICD-10-CM | POA: Diagnosis not present

## 2019-08-04 DIAGNOSIS — T8144XA Sepsis following a procedure, initial encounter: Principal | ICD-10-CM | POA: Diagnosis present

## 2019-08-04 DIAGNOSIS — R0600 Dyspnea, unspecified: Secondary | ICD-10-CM | POA: Diagnosis not present

## 2019-08-04 DIAGNOSIS — N179 Acute kidney failure, unspecified: Secondary | ICD-10-CM | POA: Diagnosis not present

## 2019-08-04 DIAGNOSIS — Z955 Presence of coronary angioplasty implant and graft: Secondary | ICD-10-CM

## 2019-08-04 DIAGNOSIS — I2583 Coronary atherosclerosis due to lipid rich plaque: Secondary | ICD-10-CM | POA: Diagnosis present

## 2019-08-04 DIAGNOSIS — Z9049 Acquired absence of other specified parts of digestive tract: Secondary | ICD-10-CM

## 2019-08-04 LAB — CBC WITH DIFFERENTIAL/PLATELET
Abs Immature Granulocytes: 0.19 10*3/uL — ABNORMAL HIGH (ref 0.00–0.07)
Basophils Absolute: 0.1 10*3/uL (ref 0.0–0.1)
Basophils Relative: 0 %
Eosinophils Absolute: 0 10*3/uL (ref 0.0–0.5)
Eosinophils Relative: 0 %
HCT: 43.2 % (ref 39.0–52.0)
Hemoglobin: 14.2 g/dL (ref 13.0–17.0)
Immature Granulocytes: 1 %
Lymphocytes Relative: 3 %
Lymphs Abs: 0.4 10*3/uL — ABNORMAL LOW (ref 0.7–4.0)
MCH: 28.5 pg (ref 26.0–34.0)
MCHC: 32.9 g/dL (ref 30.0–36.0)
MCV: 86.6 fL (ref 80.0–100.0)
Monocytes Absolute: 1.6 10*3/uL — ABNORMAL HIGH (ref 0.1–1.0)
Monocytes Relative: 9 %
Neutro Abs: 14.9 10*3/uL — ABNORMAL HIGH (ref 1.7–7.7)
Neutrophils Relative %: 87 %
Platelets: 271 10*3/uL (ref 150–400)
RBC: 4.99 MIL/uL (ref 4.22–5.81)
RDW: 14.2 % (ref 11.5–15.5)
WBC: 17.2 10*3/uL — ABNORMAL HIGH (ref 4.0–10.5)
nRBC: 0 % (ref 0.0–0.2)

## 2019-08-04 LAB — LACTIC ACID, PLASMA: Lactic Acid, Venous: 2.1 mmol/L (ref 0.5–1.9)

## 2019-08-04 LAB — COMPREHENSIVE METABOLIC PANEL
ALT: 16 U/L (ref 0–44)
AST: 16 U/L (ref 15–41)
Albumin: 3.3 g/dL — ABNORMAL LOW (ref 3.5–5.0)
Alkaline Phosphatase: 106 U/L (ref 38–126)
Anion gap: 12 (ref 5–15)
BUN: 22 mg/dL (ref 8–23)
CO2: 20 mmol/L — ABNORMAL LOW (ref 22–32)
Calcium: 8.6 mg/dL — ABNORMAL LOW (ref 8.9–10.3)
Chloride: 109 mmol/L (ref 98–111)
Creatinine, Ser: 1.5 mg/dL — ABNORMAL HIGH (ref 0.61–1.24)
GFR calc Af Amer: 52 mL/min — ABNORMAL LOW (ref >60)
GFR calc non Af Amer: 45 mL/min — ABNORMAL LOW (ref >60)
Glucose, Bld: 198 mg/dL — ABNORMAL HIGH (ref 70–99)
Potassium: 3.4 mmol/L — ABNORMAL LOW (ref 3.5–5.1)
Sodium: 141 mmol/L (ref 135–145)
Total Bilirubin: 1.1 mg/dL (ref 0.3–1.2)
Total Protein: 6.8 g/dL (ref 6.5–8.1)

## 2019-08-04 LAB — URINALYSIS, ROUTINE W REFLEX MICROSCOPIC
Bilirubin Urine: NEGATIVE
Glucose, UA: NEGATIVE mg/dL
Ketones, ur: NEGATIVE mg/dL
Nitrite: NEGATIVE
Protein, ur: 30 mg/dL — AB
Specific Gravity, Urine: 1.017 (ref 1.005–1.030)
WBC, UA: >50 WBC/hpf — ABNORMAL HIGH (ref 0–5)
pH: 6 (ref 5.0–8.0)

## 2019-08-04 LAB — RESPIRATORY PANEL BY RT PCR (FLU A&B, COVID)
Influenza A by PCR: NEGATIVE
Influenza B by PCR: NEGATIVE
SARS Coronavirus 2 by RT PCR: NEGATIVE

## 2019-08-04 MED ORDER — LACTATED RINGERS IV BOLUS
1000.0000 mL | Freq: Once | INTRAVENOUS | Status: AC
  Administered 2019-08-04: 1000 mL via INTRAVENOUS

## 2019-08-04 MED ORDER — ACETAMINOPHEN 325 MG PO TABS
650.0000 mg | ORAL_TABLET | Freq: Four times a day (QID) | ORAL | Status: DC | PRN
  Administered 2019-08-06 – 2019-08-07 (×2): 650 mg via ORAL
  Filled 2019-08-04 (×2): qty 2

## 2019-08-04 MED ORDER — SODIUM CHLORIDE 0.9% FLUSH
3.0000 mL | Freq: Once | INTRAVENOUS | Status: AC
  Administered 2019-08-04: 3 mL via INTRAVENOUS

## 2019-08-04 MED ORDER — MORPHINE SULFATE (PF) 2 MG/ML IV SOLN
2.0000 mg | INTRAVENOUS | Status: DC | PRN
  Administered 2019-08-05 – 2019-08-12 (×10): 2 mg via INTRAVENOUS
  Filled 2019-08-04 (×11): qty 1

## 2019-08-04 MED ORDER — ONDANSETRON HCL 4 MG/2ML IJ SOLN: 4.0000 mg | Freq: Four times a day (QID) | INTRAMUSCULAR | Status: DC | PRN

## 2019-08-04 MED ORDER — SODIUM CHLORIDE 0.9 % IV SOLN: INTRAVENOUS | Status: DC

## 2019-08-04 MED ORDER — ONDANSETRON HCL 4 MG PO TABS: 4.0000 mg | ORAL_TABLET | Freq: Four times a day (QID) | ORAL | Status: DC | PRN

## 2019-08-04 MED ORDER — ACETAMINOPHEN 650 MG RE SUPP: 650.0000 mg | Freq: Four times a day (QID) | RECTAL | Status: DC | PRN

## 2019-08-04 MED ORDER — SODIUM CHLORIDE 0.9 % IV SOLN
1.0000 g | INTRAVENOUS | Status: DC
  Administered 2019-08-05: 1 g via INTRAVENOUS
  Filled 2019-08-04 (×2): qty 10

## 2019-08-04 MED ORDER — SODIUM CHLORIDE 0.9 % IV SOLN
2.0000 g | Freq: Once | INTRAVENOUS | Status: AC
  Administered 2019-08-04: 2 g via INTRAVENOUS
  Filled 2019-08-04: qty 20

## 2019-08-04 MED ORDER — HEPARIN SODIUM (PORCINE) 5000 UNIT/ML IJ SOLN
5000.0000 [IU] | Freq: Three times a day (TID) | INTRAMUSCULAR | Status: DC
  Administered 2019-08-04 – 2019-08-05 (×2): 5000 [IU] via SUBCUTANEOUS
  Filled 2019-08-04 (×2): qty 1

## 2019-08-04 NOTE — H&P (Signed)
History and Physical   [name] DOB: November 20, 1945 DOA: 08/04/2019  Referring MD/NP/PA: Dr. Alvino Chapel  PCP: [name], MD   Outpatient Specialists: Dr. Raynelle Bring, urology  Patient coming from: Home  Chief Complaint: Left flank pain  HPI: [name] is a 74 y.o. male with medical history significant of renal stones, migraine headaches, chronic kidney disease stage III, morbid obesity.  Who came to the ER secondary to weakness and fever.  He has been dealing with left-sided kidney stone for which urology has been trying to dislodge.  The stone was too large to pass on its own and was unable to get stented due to enlarged prostate.  Patient also unable to get nephrostomy tube due to body habitus and this was all from last October.  He now started having generalized weakness fever nausea and vomiting.  Patient also describes his left flank pain as 6 out of 10 worse when he moves around.  He was seen in the ER with urinalysis suspected infected kidney stones.  Urology aware and plans to admit the patient to treat his infection and for urology to address his stone probably through interventional radiology.  At this point he is stable.  He denied any chills.  Mainly the pain down to 3 out of 10 on the left flank.  ED Course: Temperature is 102.1 0.2, blood pressure 150/74 pulse 97 respiratory rate of 34 and oxygen sat 93% room air.  COVID-19 is negative.  Sodium 141 potassium 3.4 glucose 198 creatinine 1.50 and calcium 8.6.  The rest of the chemistry within normal lactic acid 2.1 white count 17.2 with the rest of his CBC being normal.  Urinalysis showed large leukocytes with WBC more than 50 RBC 11-20 and rare bacteria.  CT renals shows 5 mm obstructing calculus of the proximal left ureter with hydronephrosis.  There are also additional nonobstructing renal calculi.  Nephrology consulted and patient being admitted to the hospital for treatment.  Review of Systems: As  per HPI otherwise 10 point review of systems negative.    Past Medical History:  Diagnosis Date  . Chronic kidney disease    RENAL STONES  . High frequency hearing loss   . History of migraine headaches   . Hx of colonic polyps   . Obesity   . Seborrheic dermatitis   . Vitiligo     Past Surgical History:  Procedure Laterality Date  . COLON SURGERY  01/2001  . CYSTOSCOPY/URETEROSCOPY/HOLMIUM LASER/STENT PLACEMENT Left 02/04/2019   Procedure: CYSTOSCOPY Left retrograde and attempted left ureteroscopy;  Surgeon: Raynelle Bring, MD;  Location: [facility];  Service: Urology;  Laterality: Left;  ONLY NEEDS 60 MIN  . FRACTURE SURGERY    . IR NEPHROSTOMY PLACEMENT LEFT  02/05/2019  . TONSILLECTOMY AND ADENOIDECTOMY       reports that he quit smoking about 24 years ago. His smoking use included cigarettes. He has a 40.00 pack-year smoking history. He has never used smokeless tobacco. He reports previous alcohol use. He reports that he does not use drugs.  Allergies  Allergen Reactions  . Codeine     Bad Headache    History reviewed. No pertinent family history.   Prior to Admission medications   Medication Sig Start Date End Date Taking? Authorizing Provider  cefdinir (OMNICEF) 300 MG capsule Take 1 capsule (300 mg total) by mouth 2 (two) times daily. Patient not taking: Reported on 08/04/2019 02/06/19   Raynelle Bring, MD  oxyCODONE-acetaminophen (PERCOCET) 5-325 MG tablet Take  1 tablet by mouth every 4 (four) hours as needed for severe pain. Patient not taking: Reported on 08/04/2019 02/06/19 02/06/20  Raynelle Bring, MD    Physical Exam: Vitals:   08/04/19 1900 08/04/19 1930 08/04/19 2030 08/04/19 2240  BP:  119/63 116/63 132/64  Pulse: 82 76 71 75  Resp: 20 15 14  (!) 21  Temp:  98.9 F (37.2 C)  98.3 F (36.8 C)  TempSrc:    Oral  SpO2: 97% 96% 97% 95%      Constitutional: Morbidly obese in no acute distress Vitals:   08/04/19 1900 08/04/19 1930 08/04/19 2030 08/04/19 2240    BP:  119/63 116/63 132/64  Pulse: 82 76 71 75  Resp: 20 15 14  (!) 21  Temp:  98.9 F (37.2 C)  98.3 F (36.8 C)  TempSrc:    Oral  SpO2: 97% 96% 97% 95%   Eyes: PERRL, lids and conjunctivae normal ENMT: Mucous membranes are dry. Posterior pharynx clear of any exudate or lesions.Normal dentition.  Neck: normal, supple, no masses, no thyromegaly Respiratory: clear to auscultation bilaterally, no wheezing, no crackles. Normal respiratory effort. No accessory muscle use.  Cardiovascular: Sinus tachycardia, no murmurs / rubs / gallops. No extremity edema. 2+ pedal pulses. No carotid bruits.  Abdomen: no tenderness, no masses palpated. No hepatosplenomegaly. Bowel sounds positive.  Musculoskeletal: no clubbing / cyanosis. No joint deformity upper and lower extremities. Good ROM, no contractures. Normal muscle tone.  Skin: no rashes, lesions, ulcers. No induration Neurologic: CN 2-12 grossly intact. Sensation intact, DTR normal. Strength 5/5 in all 4.  Psychiatric: Normal judgment and insight. Alert and oriented x 3. Normal mood.     Labs on Admission: I have personally reviewed following labs and imaging studies  CBC: Recent Labs  Lab 08/04/19 1702  WBC 17.2*  NEUTROABS 14.9*  HGB 14.2  HCT 43.2  MCV 86.6  PLT 99991111   Basic Metabolic Panel: Recent Labs  Lab 08/04/19 1702  NA 141  K 3.4*  CL 109  CO2 20*  GLUCOSE 198*  BUN 22  CREATININE 1.50*  CALCIUM 8.6*   GFR: CrCl cannot be calculated (Unknown ideal weight.). Liver Function Tests: Recent Labs  Lab 08/04/19 1702  AST 16  ALT 16  ALKPHOS 106  BILITOT 1.1  PROT 6.8  ALBUMIN 3.3*   No results for input(s): LIPASE, AMYLASE in the last 168 hours. No results for input(s): AMMONIA in the last 168 hours. Coagulation Profile: No results for input(s): INR, PROTIME in the last 168 hours. Cardiac Enzymes: No results for input(s): CKTOTAL, CKMB, CKMBINDEX, TROPONINI in the last 168 hours. BNP (last 3 results) No  results for input(s): PROBNP in the last 8760 hours. HbA1C: No results for input(s): HGBA1C in the last 72 hours. CBG: No results for input(s): GLUCAP in the last 168 hours. Lipid Profile: No results for input(s): CHOL, HDL, LDLCALC, TRIG, CHOLHDL, LDLDIRECT in the last 72 hours. Thyroid Function Tests: No results for input(s): TSH, T4TOTAL, FREET4, T3FREE, THYROIDAB in the last 72 hours. Anemia Panel: No results for input(s): VITAMINB12, FOLATE, FERRITIN, TIBC, IRON, RETICCTPCT in the last 72 hours. Urine analysis:    Component Value Date/Time   COLORURINE AMBER (A) 08/04/2019 1702   APPEARANCEUR CLOUDY (A) 08/04/2019 1702   LABSPEC 1.017 08/04/2019 1702   PHURINE 6.0 08/04/2019 1702   GLUCOSEU NEGATIVE 08/04/2019 1702   HGBUR SMALL (A) 08/04/2019 1702   BILIRUBINUR NEGATIVE 08/04/2019 1702   KETONESUR NEGATIVE 08/04/2019 1702   PROTEINUR 30 (  A) 08/04/2019 1702   NITRITE NEGATIVE 08/04/2019 1702   LEUKOCYTESUR LARGE (A) 08/04/2019 1702   Sepsis Labs: @LABRCNTIP (procalcitonin:4,lacticidven:4) ) Recent Results (from the past 240 hour(s))  Respiratory Panel by RT PCR (Flu A&B, Covid) - Nasopharyngeal Swab     Status: None   Collection Time: 08/04/19  7:21 PM   Specimen: Nasopharyngeal Swab  Result Value Ref Range Status   SARS Coronavirus 2 by RT PCR NEGATIVE NEGATIVE Final    Comment: (NOTE) SARS-CoV-2 target nucleic acids are NOT DETECTED. The SARS-CoV-2 RNA is generally detectable in upper respiratoy specimens during the acute phase of infection. The lowest concentration of SARS-CoV-2 viral copies this assay can detect is 131 copies/mL. A negative result does not preclude SARS-Cov-2 infection and should not be used as the sole basis for treatment or other patient management decisions. A negative result may occur with  improper specimen collection/handling, submission of specimen other than nasopharyngeal swab, presence of viral mutation(s) within the areas targeted by  this assay, and inadequate number of viral copies (<131 copies/mL). A negative result must be combined with clinical observations, patient history, and epidemiological information. The expected result is Negative. Fact Sheet for Patients:  PinkCheek.be Fact Sheet for Healthcare Providers:  GravelBags.it This test is not yet ap proved or cleared by the [place] FDA and  has been authorized for detection and/or diagnosis of SARS-CoV-2 by FDA under an Emergency Use Authorization (EUA). This EUA will remain  in effect (meaning this test can be used) for the duration of the COVID-19 declaration under Section 564(b)(1) of the Act, 21 U.S.C. section 360bbb-3(b)(1), unless the authorization is terminated or revoked sooner.    Influenza A by PCR NEGATIVE NEGATIVE Final   Influenza B by PCR NEGATIVE NEGATIVE Final    Comment: (NOTE) The Xpert Xpress SARS-CoV-2/FLU/RSV assay is intended as an aid in  the diagnosis of influenza from Nasopharyngeal swab specimens and  should not be used as a sole basis for treatment. Nasal washings and  aspirates are unacceptable for Xpert Xpress SARS-CoV-2/FLU/RSV  testing. Fact Sheet for Patients: PinkCheek.be Fact Sheet for Healthcare Providers: GravelBags.it This test is not yet approved or cleared by the [place] FDA and  has been authorized for detection and/or diagnosis of SARS-CoV-2 by  FDA under an Emergency Use Authorization (EUA). This EUA will remain  in effect (meaning this test can be used) for the duration of the  Covid-19 declaration under Section 564(b)(1) of the Act, 21  U.S.C. section 360bbb-3(b)(1), unless the authorization is  terminated or revoked. Performed at [facility], [place] [address] [place], [place] [address]      Radiological Exams on Admission: DG Chest Portable 1 View  Result  Date: 08/04/2019 CLINICAL DATA:  Fever EXAM: PORTABLE CHEST 1 VIEW COMPARISON:  06/09/2005 FINDINGS: Cardiomegaly. No confluent opacities, effusions or edema. No acute bony abnormality. IMPRESSION: Cardiomegaly.  No active disease. Electronically Signed   By: Rolm Baptise M.D.   On: 08/04/2019 17:43   DG Abd Portable 1 View  Result Date: 08/04/2019 CLINICAL DATA:  Fever, renal stone EXAM: PORTABLE ABDOMEN - 1 VIEW COMPARISON:  CT 02/05/2019 FINDINGS: Study limited by body habitus. Calcification projects over the left abdomen, likely left renal stone or possibly the previously seen left renal pelvic stone. No definite calcifications over the course of the ureters although small stones may be difficult to visualize. Nonobstructive bowel gas pattern. No organomegaly. IMPRESSION: Limited study due to body habitus. Calcification in the left abdomen could  reflect the previously seen left renal or renal pelvic stone. Electronically Signed   By: Rolm Baptise M.D.   On: 08/04/2019 17:45   CT Renal Stone Study  Result Date: 08/04/2019 CLINICAL DATA:  Gross hematuria, history of kidney stones EXAM: CT ABDOMEN AND PELVIS WITHOUT CONTRAST TECHNIQUE: Multidetector CT imaging of the abdomen and pelvis was performed following the standard protocol without IV contrast. COMPARISON:  02/05/2019 FINDINGS: Lower chest: No acute abnormality. Hepatobiliary: Hepatic steatosis. Cholelithiasis. No biliary dilatation. Pancreas: Unremarkable. Spleen: Unremarkable. Adrenals/Urinary Tract: Adrenals are unremarkable. Renal cortical thinning. Stable small right upper pole renal cyst. Bilateral nonobstructing renal calculi are again identified measuring up to 3 mm. There is a 5 mm obstructing calculus within the proximal left ureter with resulting hydroureteronephrosis. Poorly distended bladder is unremarkable. Stomach/Bowel: Stomach is within normal limits. Bowel is normal in caliber. Small bowel loops are closely apposed to the ventral  abdominal wall. Mild diverticulosis. Vascular/Lymphatic: Aortic atherosclerosis. No enlarged abdominal or pelvic lymph nodes. No adenopathy. Reproductive: Marked prostate enlargement. Other: No ascites. Small fat containing inguinal hernias. Tiny fat containing left parasagittal ventral hernia no longer contains a bowel loop. Musculoskeletal: No acute osseous finding. IMPRESSION: 5 mm obstructing calculus of the proximal left ureter with hydroureteronephrosis. Additional nonobstructing renal calculi. Additional stable findings as detailed above. Electronically Signed   By: Macy Mis M.D.   On: 08/04/2019 19:21      Assessment/Plan Principal Problem:   infected Renal calculus, left Active Problems:   CKD (chronic kidney disease), stage III   Hypokalemia     #1 infected left renal stone: Patient will be admitted and initiated IV Rocephin.  Pain management and IV fluids.  Urology to follow and plan next treatment.  He appears to be stable at this point.  #2 acute on chronic kidney disease: Patient has obstructive uropathy with hydronephrosis.  IV fluid and supportive care.  Defer to urology for obstructive symptoms relief  #3 hypokalemia: Replete potassium  #4 morbid obesity: Dietary counseling   DVT prophylaxis: Heparin Code Status: Full code Family Communication: Discussed care with patient no family around Disposition Plan: Home Consults called:  Dr. Diona Fanti, urology Admission status: Inpatient  Severity of Illness: The appropriate patient status for this patient is INPATIENT. Inpatient status is judged to be reasonable and necessary in order to provide the required intensity of service to ensure the patient's safety. The patient's presenting symptoms, physical exam findings, and initial radiographic and laboratory data in the context of their chronic comorbidities is felt to place them at high risk for further clinical deterioration. Furthermore, it is not anticipated that the  patient will be medically stable for discharge from the hospital within 2 midnights of admission. The following factors support the patient status of inpatient.   " The patient's presenting symptoms include left flank pain. " The worrisome physical exam findings include fever with tenderness in the left flank. " The initial radiographic and laboratory data are worrisome because of 5 mm stone noted on CT. " The chronic co-morbidities include history of nephrolithiasis.   * I certify that at the point of admission it is my clinical judgment that the patient will require inpatient hospital care spanning beyond 2 midnights from the point of admission due to high intensity of service, high risk for further deterioration and high frequency of surveillance required.Barbette Merino MD [facility] Pager [phone]  If 7PM-7AM, please contact night-coverage [email or web address] Password [email or web address]  08/05/2019, 12:50 AM

## 2019-08-04 NOTE — ED Triage Notes (Addendum)
Arrived via [facility] from home.   EMS called out for a lift assist for a fall.   Patient reports a 1-2 weeks ago he started to have dark colored urine that now has a strong odor with frequent urination   100.8 temporal   1000mg  tylenol given by ems @ 1618.   Wife states patient has been falling because he has been feeling week.    Tachy at HR-112 98% RA RR-20 BP- 148/82 CBG-141  Hx. Kidney stones

## 2019-08-04 NOTE — ED Notes (Signed)
Patient only able to urinate about 3 mls. Urine sent to lab without culture.    2 sets of blood cultures obtained and sent to lab.

## 2019-08-04 NOTE — Consult Note (Signed)
Urology Consult  Consulting MD: Davonna Belling, MD  CC: Left kidney stone with urinary tract infection  HPI: This is a 74year old male with known history of urolithiasis, previously seen by Dr. Raynelle Bring of our group.  He underwent failed cystoscopic and percutaneous drainage/management of his left renal unit due to prostatomegaly and very difficult lower ureteral access, as well as body habitus issues that prevented adequate percutaneous tube placement.  The patient has had no real symptoms of the stone since that time, specifically for left flank pain.  However, over the past few weeks he states he has had weakness, foul-smelling urine, and over the past few days shakes, chills and fever.  He presented to the emergency room tonight with worsening of his condition.  He does have a white blood cell count of 17,000, lactic acid of 2.1.  Creatinine is up slightly to 1.5, over his baseline of 1.1 back in early October, 2020.  CT scan revealed a moderate size left upper ureteral stone with mild to moderate left hydronephrosis.  He does have an extrarenal pelvis.  Urologic consultation is requested.  PMH: Past Medical History:  Diagnosis Date  . Chronic kidney disease    RENAL STONES  . High frequency hearing loss   . History of migraine headaches   . Hx of colonic polyps   . Obesity   . Seborrheic dermatitis   . Vitiligo     PSH: Past Surgical History:  Procedure Laterality Date  . COLON SURGERY  01/2001  . CYSTOSCOPY/URETEROSCOPY/HOLMIUM LASER/STENT PLACEMENT Left 02/04/2019   Procedure: CYSTOSCOPY Left retrograde and attempted left ureteroscopy;  Surgeon: Raynelle Bring, MD;  Location: [facility];  Service: Urology;  Laterality: Left;  ONLY NEEDS 60 MIN  . FRACTURE SURGERY    . IR NEPHROSTOMY PLACEMENT LEFT  02/05/2019  . TONSILLECTOMY AND ADENOIDECTOMY      Allergies: Allergies  Allergen Reactions  . Codeine     Bad Headache    Medications: (Not in a hospital  admission)    Social History: Social History   Socioeconomic History  . Marital status: Single    Spouse name: Not on file  . Number of children: Not on file  . Years of education: Not on file  . Highest education level: Not on file  Occupational History  . Not on file  Tobacco Use  . Smoking status: Former Smoker    Packs/day: 1.00    Years: 40.00    Pack years: 40.00    Types: Cigarettes    Quit date: 1997    Years since quitting: 24.2  . Smokeless tobacco: Never Used  Substance and Sexual Activity  . Alcohol use: Not Currently  . Drug use: Never  . Sexual activity: Not on file  Other Topics Concern  . Not on file  Social History Narrative  . Not on file   Social Determinants of Health   Financial Resource Strain:   . Difficulty of Paying Living Expenses:   Food Insecurity:   . Worried About Charity fundraiser in the Last Year:   . Arboriculturist in the Last Year:   Transportation Needs:   . Film/video editor (Medical):   Marland Kitchen Lack of Transportation (Non-Medical):   Physical Activity:   . Days of Exercise per Week:   . Minutes of Exercise per Session:   Stress:   . Feeling of Stress :   Social Connections:   . Frequency of Communication with Friends and Family:   .  Frequency of Social Gatherings with Friends and Family:   . Attends Religious Services:   . Active Member of Clubs or Organizations:   . Attends Archivist Meetings:   Marland Kitchen Marital Status:   Intimate Partner Violence:   . Fear of Current or Ex-Partner:   . Emotionally Abused:   Marland Kitchen Physically Abused:   . Sexually Abused:     Family History: History reviewed. No pertinent family history.  Review of Systems: Positive: Foul-smelling urine, dysuria, cloudy urine, fever, chills, weakness Negative:  A further 10 point review of systems was negative except what is listed in the HPI.  Physical Exam: @VITALS2 @ General: No acute distress.  Awake.  Head:  Normocephalic.   Atraumatic. ENT:  EOMI.  Mucous membranes moist Neck:  Supple.  No lymphadenopathy. CV:  Regular rate. Pulmonary: Equal effort bilaterally.   Abdomen: Obese, soft, mild left CVA and left lower quadrant tenderness.  No rebound or guarding. Skin:  Normal turgor.  No visible rash. Extremity: No gross deformity of upper extremities.  No gross deformity of lower extremities. Neurologic: Alert. Appropriate mood.    Studies:  Recent Labs    08/04/19 1702  HGB 14.2  WBC 17.2*  PLT 271    Recent Labs    08/04/19 1702  NA 141  K 3.4*  CL 109  CO2 20*  BUN 22  CREATININE 1.50*  CALCIUM 8.6*  GFRNONAA 45*  GFRAA 52*     No results for input(s): INR, APTT in the last 72 hours.  Invalid input(s): PT   Invalid input(s): ABG  I reviewed the patient's laboratories, as well as CT scan images from both today and October, 2016.  Prior medical records/procedure notes were reviewed.  Assessment: Left upper ureteral stone with mild to moderate hydroureteronephrosis with an extrarenal pelvis on the left.  He has an associated urinary tract infection as well as fever, slight elevation of lactic acid.  Plan: This is obviously a difficult situation with 2 failed procedures to gain access to the left renal unit in October of this year.  And seeing his CT scan at that time, he did not have significant dilatation of his calyces/renal pelvis.  Currently, there is significant dilatation so I do think percutaneous access would be easier.  I have communicated with Dr. [name], who will not be available until Tuesday.  He suggest that we strongly consider percutaneous access.  Currently, I think a procedure is not necessary tonight.  I have asked hospital service to assist with inpatient management.  I will speak with radiologist tomorrow about possibility of percutaneous access, which can be done without a general anesthetic.  I discussed this thoroughly with the patient who is agreeable to any  treatment.    Pager:[phone]

## 2019-08-04 NOTE — ED Provider Notes (Signed)
[facility] DEPT Provider Note   CSN: [id number] Arrival date & time: 08/04/19  [id number]     History Chief Complaint  Patient presents with  . Fever  . Urinary Tract Infection    [name] is a 74 y.o. male.  HPI Patient presents with weakness and fever.  Reportedly is been doing generally weak and having trouble standing.  EMS was called for lift assist after he fell down.  States fever up to 100.8 by EMS.  Urine has been going more frequently.  Has a known left-sided kidney stone.  Was too large to be able to pass on its own.  Unable to get stented by Dr. [name] due to large prostate.  Unable to get nephrostomy tube due to body habitus.  This was back in October.  Has had nausea and vomited.  No diarrhea.  No real flank pain at this time.  States he always has trouble breathing    Past Medical History:  Diagnosis Date  . Chronic kidney disease    RENAL STONES  . High frequency hearing loss   . History of migraine headaches   . Hx of colonic polyps   . Obesity   . Seborrheic dermatitis   . Vitiligo     Patient Active Problem List   Diagnosis Date Noted  . Renal calculus, left 02/04/2019    Past Surgical History:  Procedure Laterality Date  . COLON SURGERY  01/2001  . CYSTOSCOPY/URETEROSCOPY/HOLMIUM LASER/STENT PLACEMENT Left 02/04/2019   Procedure: CYSTOSCOPY Left retrograde and attempted left ureteroscopy;  Surgeon: Raynelle Bring, MD;  Location: [facility];  Service: Urology;  Laterality: Left;  ONLY NEEDS 60 MIN  . FRACTURE SURGERY    . IR NEPHROSTOMY PLACEMENT LEFT  02/05/2019  . TONSILLECTOMY AND ADENOIDECTOMY         History reviewed. No pertinent family history.  Social History   Tobacco Use  . Smoking status: Former Smoker    Packs/day: 1.00    Years: 40.00    Pack years: 40.00    Types: Cigarettes    Quit date: 1997    Years since quitting: 24.2  . Smokeless tobacco: Never Used  Substance Use Topics  . Alcohol use:  Not Currently  . Drug use: Never    Home Medications Prior to Admission medications   Medication Sig Start Date End Date Taking? Authorizing Provider  cefdinir (OMNICEF) 300 MG capsule Take 1 capsule (300 mg total) by mouth 2 (two) times daily. Patient not taking: Reported on 08/04/2019 02/06/19   Raynelle Bring, MD  oxyCODONE-acetaminophen (PERCOCET) 5-325 MG tablet Take 1 tablet by mouth every 4 (four) hours as needed for severe pain. Patient not taking: Reported on 08/04/2019 02/06/19 02/06/20  Raynelle Bring, MD    Allergies    Codeine  Review of Systems   Review of Systems  Constitutional: Positive for appetite change, fatigue and fever.  HENT: Negative for congestion.   Respiratory: Positive for cough.   Cardiovascular: Negative for chest pain.  Gastrointestinal: Positive for nausea and vomiting. Negative for abdominal pain.  Genitourinary: Positive for frequency.  Musculoskeletal: Negative for back pain.  Skin: Negative for rash.  Neurological: Positive for weakness.  Psychiatric/Behavioral: Negative for confusion.    Physical Exam Updated Vital Signs BP 119/63   Pulse 76   Temp 98.9 F (37.2 C)   Resp 15   SpO2 96%   Physical Exam Vitals and nursing note reviewed.  HENT:     Head: Atraumatic.  Eyes:  Extraocular Movements: Extraocular movements intact.     Pupils: Pupils are equal, round, and reactive to light.  Pulmonary:     Breath sounds: Wheezing present.  Abdominal:     Comments: Some tenderness in left lower quadrant.  Musculoskeletal:     Cervical back: Neck supple.     Right lower leg: Edema present.     Left lower leg: Edema present.  Skin:    General: Skin is warm.     Capillary Refill: Capillary refill takes less than 2 seconds.  Neurological:     Mental Status: He is alert and oriented to person, place, and time.     ED Results / Procedures / Treatments   Labs (all labs ordered are listed, but only abnormal results are displayed) Labs  Reviewed  LACTIC ACID, PLASMA - Abnormal; Notable for the following components:      Result Value   Lactic Acid, Venous 2.1 (*)    All other components within normal limits  COMPREHENSIVE METABOLIC PANEL - Abnormal; Notable for the following components:   Potassium 3.4 (*)    CO2 20 (*)    Glucose, Bld 198 (*)    Creatinine, Ser 1.50 (*)    Calcium 8.6 (*)    Albumin 3.3 (*)    GFR calc non Af Amer 45 (*)    GFR calc Af Amer 52 (*)    All other components within normal limits  CBC WITH DIFFERENTIAL/PLATELET - Abnormal; Notable for the following components:   WBC 17.2 (*)    Neutro Abs 14.9 (*)    Lymphs Abs 0.4 (*)    Monocytes Absolute 1.6 (*)    Abs Immature Granulocytes 0.19 (*)    All other components within normal limits  URINALYSIS, ROUTINE W REFLEX MICROSCOPIC - Abnormal; Notable for the following components:   Color, Urine AMBER (*)    APPearance CLOUDY (*)    Hgb urine dipstick SMALL (*)    Protein, ur 30 (*)    Leukocytes,Ua LARGE (*)    WBC, UA >50 (*)    Bacteria, UA RARE (*)    Non Squamous Epithelial 0-5 (*)    Crystals PRESENT (*)    All other components within normal limits  RESPIRATORY PANEL BY RT PCR (FLU A&B, COVID)  CULTURE, BLOOD (ROUTINE X 2)  CULTURE, BLOOD (ROUTINE X 2)  URINE CULTURE  LACTIC ACID, PLASMA    EKG None  Radiology DG Chest Portable 1 View  Result Date: 08/04/2019 CLINICAL DATA:  Fever EXAM: PORTABLE CHEST 1 VIEW COMPARISON:  06/09/2005 FINDINGS: Cardiomegaly. No confluent opacities, effusions or edema. No acute bony abnormality. IMPRESSION: Cardiomegaly.  No active disease. Electronically Signed   By: Rolm Baptise M.D.   On: 08/04/2019 17:43   DG Abd Portable 1 View  Result Date: 08/04/2019 CLINICAL DATA:  Fever, renal stone EXAM: PORTABLE ABDOMEN - 1 VIEW COMPARISON:  CT 02/05/2019 FINDINGS: Study limited by body habitus. Calcification projects over the left abdomen, likely left renal stone or possibly the previously seen left  renal pelvic stone. No definite calcifications over the course of the ureters although small stones may be difficult to visualize. Nonobstructive bowel gas pattern. No organomegaly. IMPRESSION: Limited study due to body habitus. Calcification in the left abdomen could reflect the previously seen left renal or renal pelvic stone. Electronically Signed   By: Rolm Baptise M.D.   On: 08/04/2019 17:45   CT Renal Stone Study  Result Date: 08/04/2019 CLINICAL DATA:  Gross hematuria, history  of kidney stones EXAM: CT ABDOMEN AND PELVIS WITHOUT CONTRAST TECHNIQUE: Multidetector CT imaging of the abdomen and pelvis was performed following the standard protocol without IV contrast. COMPARISON:  02/05/2019 FINDINGS: Lower chest: No acute abnormality. Hepatobiliary: Hepatic steatosis. Cholelithiasis. No biliary dilatation. Pancreas: Unremarkable. Spleen: Unremarkable. Adrenals/Urinary Tract: Adrenals are unremarkable. Renal cortical thinning. Stable small right upper pole renal cyst. Bilateral nonobstructing renal calculi are again identified measuring up to 3 mm. There is a 5 mm obstructing calculus within the proximal left ureter with resulting hydroureteronephrosis. Poorly distended bladder is unremarkable. Stomach/Bowel: Stomach is within normal limits. Bowel is normal in caliber. Small bowel loops are closely apposed to the ventral abdominal wall. Mild diverticulosis. Vascular/Lymphatic: Aortic atherosclerosis. No enlarged abdominal or pelvic lymph nodes. No adenopathy. Reproductive: Marked prostate enlargement. Other: No ascites. Small fat containing inguinal hernias. Tiny fat containing left parasagittal ventral hernia no longer contains a bowel loop. Musculoskeletal: No acute osseous finding. IMPRESSION: 5 mm obstructing calculus of the proximal left ureter with hydroureteronephrosis. Additional nonobstructing renal calculi. Additional stable findings as detailed above. Electronically Signed   By: Macy Mis M.D.    On: 08/04/2019 19:21    Procedures Procedures (including critical care time)  Medications Ordered in ED Medications  sodium chloride flush (NS) 0.9 % injection 3 mL (3 mLs Intravenous Given 08/04/19 1702)  cefTRIAXone (ROCEPHIN) 2 g in sodium chloride 0.9 % 100 mL IVPB (0 g Intravenous Stopped 08/04/19 1930)  lactated ringers bolus 1,000 mL (1,000 mLs Intravenous New Bag/Given 08/04/19 1929)    ED Course  I have reviewed the triage vital signs and the nursing notes.  Pertinent labs & imaging results that were available during my care of the patient were reviewed by me and considered in my medical decision making (see chart for details).    MDM Rules/Calculators/A&P                      Patient with fever and generalized weakness.  Has been weak for a while now however.  Found to have UTI.  Has known ureteral stone on the left.  Dr. [name] has previously tried to get at the stone from below but was unable to do prostate size.  Interventional radiology was not able to cannulate the pelvis for percutaneous nephrostomy.  Has been seen in the ER by Dr. Diona Fanti.  Says patient should be able to get percutaneous nephrostomy as opposed to trying to access from below and failing.  Patient is not septic right now and does not need the procedure done tonight.  Urology will manage either IR or them with the procedure.  But will admit to internal medicine Final Clinical Impression(s) / ED Diagnoses Final diagnoses:  Acute pyelonephritis  Ureteral stone with hydronephrosis    Rx / DC Orders ED Discharge Orders    None       Davonna Belling, MD 08/04/19 2019

## 2019-08-04 NOTE — ED Notes (Signed)
Bladder Scan 63mls.

## 2019-08-04 NOTE — ED Notes (Addendum)
Date and time results received: 08/04/19 6:06 PM    Test: Lactic Acid  Critical Value: 2.1  Name of Provider Notified: Alvino Chapel, EDP made aware.

## 2019-08-05 ENCOUNTER — Other Ambulatory Visit: Payer: Self-pay

## 2019-08-05 DIAGNOSIS — E876 Hypokalemia: Secondary | ICD-10-CM | POA: Diagnosis not present

## 2019-08-05 DIAGNOSIS — N200 Calculus of kidney: Secondary | ICD-10-CM | POA: Diagnosis not present

## 2019-08-05 LAB — COMPREHENSIVE METABOLIC PANEL
ALT: 14 U/L (ref 0–44)
AST: 13 U/L — ABNORMAL LOW (ref 15–41)
Albumin: 2.7 g/dL — ABNORMAL LOW (ref 3.5–5.0)
Alkaline Phosphatase: 87 U/L (ref 38–126)
Anion gap: 6 (ref 5–15)
BUN: 20 mg/dL (ref 8–23)
CO2: 26 mmol/L (ref 22–32)
Calcium: 8.3 mg/dL — ABNORMAL LOW (ref 8.9–10.3)
Chloride: 108 mmol/L (ref 98–111)
Creatinine, Ser: 1.5 mg/dL — ABNORMAL HIGH (ref 0.61–1.24)
GFR calc Af Amer: 52 mL/min — ABNORMAL LOW (ref >60)
GFR calc non Af Amer: 45 mL/min — ABNORMAL LOW (ref >60)
Glucose, Bld: 148 mg/dL — ABNORMAL HIGH (ref 70–99)
Potassium: 3.5 mmol/L (ref 3.5–5.1)
Sodium: 140 mmol/L (ref 135–145)
Total Bilirubin: 1 mg/dL (ref 0.3–1.2)
Total Protein: 5.8 g/dL — ABNORMAL LOW (ref 6.5–8.1)

## 2019-08-05 LAB — CBC
HCT: 40 % (ref 39.0–52.0)
Hemoglobin: 12.9 g/dL — ABNORMAL LOW (ref 13.0–17.0)
MCH: 28.2 pg (ref 26.0–34.0)
MCHC: 32.3 g/dL (ref 30.0–36.0)
MCV: 87.3 fL (ref 80.0–100.0)
Platelets: 212 10*3/uL (ref 150–400)
RBC: 4.58 MIL/uL (ref 4.22–5.81)
RDW: 14.2 % (ref 11.5–15.5)
WBC: 12.5 10*3/uL — ABNORMAL HIGH (ref 4.0–10.5)
nRBC: 0 % (ref 0.0–0.2)

## 2019-08-05 LAB — PROTIME-INR
INR: 1.1 (ref 0.8–1.2)
Prothrombin Time: 14.3 seconds (ref 11.4–15.2)

## 2019-08-05 NOTE — Progress Notes (Signed)
Patient arrived at approximately 2214. He is alert and verbally responsive and voiced no complaints at this time.

## 2019-08-05 NOTE — Progress Notes (Signed)
Referring Physician(s): Dahlstedt,S  Supervising Physician: Jacqulynn Cadet  Patient Status:  [facility] - In-pt  Chief Complaint:  Left flank pain/nephrolithiasis/hydronephrosis  Subjective: Patient familiar to IR service from attempted though ultimately unsuccessful left percutaneous nephrostomy secondary to lack of hydronephrosis and patient's extreme obesity on 02/05/2019.  He also has a history of failed cystoscopic management of the left kidney stone disease due to prostatomegaly and very difficult lower ureteral access.  He was admitted to [facility] 08/04/19 with history of weakness, foul-smelling urine,  left flank discomfort, occ fever and chills.  Latest labs include WBC 12.5, hemoglobin 12.9, platelets 212k, creatinine 1.5, PT/INR normal, COVID-19 negative.  He is currently afebrile and on IV antibiotic therapy.  Latest imaging studies revealed 5 mm obstructing calculus of the proximal left ureter with hydroureteronephrosis with additional nonobstructing renal calculi.  Request now received from urology for left percutaneous nephrostomy.  Patient currently denies headache, chest pain, cough, abdominal pain, nausea/vomiting or bleeding.  He does have some mild left flank discomfort and dyspnea with exertion.  Past Medical History:  Diagnosis Date  . Chronic kidney disease    RENAL STONES  . High frequency hearing loss   . History of migraine headaches   . Hx of colonic polyps   . Obesity   . Seborrheic dermatitis   . Vitiligo    Past Surgical History:  Procedure Laterality Date  . COLON SURGERY  01/2001  . CYSTOSCOPY/URETEROSCOPY/HOLMIUM LASER/STENT PLACEMENT Left 02/04/2019   Procedure: CYSTOSCOPY Left retrograde and attempted left ureteroscopy;  Surgeon: Raynelle Bring, MD;  Location: [facility];  Service: Urology;  Laterality: Left;  ONLY NEEDS 60 MIN  . FRACTURE SURGERY    . IR NEPHROSTOMY PLACEMENT LEFT  02/05/2019  . TONSILLECTOMY AND ADENOIDECTOMY         Allergies: Codeine  Medications: Prior to Admission medications   Medication Sig Start Date End Date Taking? Authorizing Provider  cefdinir (OMNICEF) 300 MG capsule Take 1 capsule (300 mg total) by mouth 2 (two) times daily. Patient not taking: Reported on 08/04/2019 02/06/19   Raynelle Bring, MD  oxyCODONE-acetaminophen (PERCOCET) 5-325 MG tablet Take 1 tablet by mouth every 4 (four) hours as needed for severe pain. Patient not taking: Reported on 08/04/2019 02/06/19 02/06/20  Raynelle Bring, MD     Vital Signs: BP 131/76 (BP Location: Right Arm)   Pulse 66   Temp 97.8 F (36.6 C) (Oral)   Resp 20   Ht 5\' 8"  (1.727 m)   Wt 265 lb 6.9 oz (120.4 kg)   SpO2 95%   BMI 40.36 kg/m   Physical Exam awake, alert.  Chest clear to auscultation bilaterally.  Heart with regular rate and rhythm.  Abdomen obese, soft, positive bowel sounds, not significantly tender.  Trace pretibial edema bilaterally.  Imaging: DG Chest Portable 1 View  Result Date: 08/04/2019 CLINICAL DATA:  Fever EXAM: PORTABLE CHEST 1 VIEW COMPARISON:  06/09/2005 FINDINGS: Cardiomegaly. No confluent opacities, effusions or edema. No acute bony abnormality. IMPRESSION: Cardiomegaly.  No active disease. Electronically Signed   By: Rolm Baptise M.D.   On: 08/04/2019 17:43   DG Abd Portable 1 View  Result Date: 08/04/2019 CLINICAL DATA:  Fever, renal stone EXAM: PORTABLE ABDOMEN - 1 VIEW COMPARISON:  CT 02/05/2019 FINDINGS: Study limited by body habitus. Calcification projects over the left abdomen, likely left renal stone or possibly the previously seen left renal pelvic stone. No definite calcifications over the course of the ureters although small stones may be  difficult to visualize. Nonobstructive bowel gas pattern. No organomegaly. IMPRESSION: Limited study due to body habitus. Calcification in the left abdomen could reflect the previously seen left renal or renal pelvic stone. Electronically Signed   By: Rolm Baptise M.D.    On: 08/04/2019 17:45   CT Renal Stone Study  Result Date: 08/04/2019 CLINICAL DATA:  Gross hematuria, history of kidney stones EXAM: CT ABDOMEN AND PELVIS WITHOUT CONTRAST TECHNIQUE: Multidetector CT imaging of the abdomen and pelvis was performed following the standard protocol without IV contrast. COMPARISON:  02/05/2019 FINDINGS: Lower chest: No acute abnormality. Hepatobiliary: Hepatic steatosis. Cholelithiasis. No biliary dilatation. Pancreas: Unremarkable. Spleen: Unremarkable. Adrenals/Urinary Tract: Adrenals are unremarkable. Renal cortical thinning. Stable small right upper pole renal cyst. Bilateral nonobstructing renal calculi are again identified measuring up to 3 mm. There is a 5 mm obstructing calculus within the proximal left ureter with resulting hydroureteronephrosis. Poorly distended bladder is unremarkable. Stomach/Bowel: Stomach is within normal limits. Bowel is normal in caliber. Small bowel loops are closely apposed to the ventral abdominal wall. Mild diverticulosis. Vascular/Lymphatic: Aortic atherosclerosis. No enlarged abdominal or pelvic lymph nodes. No adenopathy. Reproductive: Marked prostate enlargement. Other: No ascites. Small fat containing inguinal hernias. Tiny fat containing left parasagittal ventral hernia no longer contains a bowel loop. Musculoskeletal: No acute osseous finding. IMPRESSION: 5 mm obstructing calculus of the proximal left ureter with hydroureteronephrosis. Additional nonobstructing renal calculi. Additional stable findings as detailed above. Electronically Signed   By: Macy Mis M.D.   On: 08/04/2019 19:21    Labs:  CBC: Recent Labs    02/05/19 0513 02/06/19 0541 08/04/19 1702 08/05/19 0515  WBC 14.5* 16.3* 17.2* 12.5*  HGB 14.6 14.8 14.2 12.9*  HCT 45.2 45.7 43.2 40.0  PLT 265 273 271 212    COAGS: Recent Labs    02/04/19 1354 08/05/19 0856  INR 1.0 1.1    BMP: Recent Labs    02/06/19 0541 02/06/19 1342 08/04/19 1702  08/05/19 0515  NA 137 139 141 140  K 4.4 3.8 3.4* 3.5  CL 104 106 109 108  CO2 23 25 20* 26  GLUCOSE 141* 147* 198* 148*  BUN 22 24* 22 20  CALCIUM 8.8* 8.7* 8.6* 8.3*  CREATININE 1.38* 1.31* 1.50* 1.50*  GFRNONAA 50* 53* 45* 45*  GFRAA 58* >60 52* 52*    LIVER FUNCTION TESTS: Recent Labs    08/04/19 1702 08/05/19 0515  BILITOT 1.1 1.0  AST 16 13*  ALT 16 14  ALKPHOS 106 87  PROT 6.8 5.8*  ALBUMIN 3.3* 2.7*    Assessment and Plan: 74 year old male with history of nephrolithiasis and attempted though ultimately unsuccessful left percutaneous nephrostomy secondary to lack of hydronephrosis and patient's extreme obesity on 02/05/2019.  He also has a history of failed cystoscopic management of the left kidney stone disease due to prostatomegaly and very difficult lower ureteral access.  He was admitted to [facility] 08/04/19 with history of weakness, foul-smelling urine,  left flank discomfort, occ fever and chills.  Latest labs include WBC 12.5, hemoglobin 12.9, platelets 212k, creatinine 1.5, PT/INR normal, COVID-19 negative.  He is currently afebrile and on IV antibiotic therapy.  Latest imaging studies revealed 5 mm obstructing calculus of the proximal left ureter with hydroureteronephrosis with additional nonobstructing renal calculi.  Request now received from urology for left percutaneous nephrostomy.  Imaging studies were reviewed by Dr. Vernard Gambles.Risks and benefits of left PCN placement was discussed with the patient including, but not limited to, infection, bleeding, significant  bleeding causing loss or decrease in renal function or damage to adjacent structures.   All of the patient's questions were answered, patient is agreeable to proceed.  Consent signed and in chart.  Patient ate this morning and also received subcu heparin therefore procedure will be performed on 4/6.  Dr. Diona Fanti aware.    Electronically Signed: D. Rowe Robert, PA-C 08/05/2019, 11:30  AM   I spent a total of 25 minutes at the the patient's bedside AND on the patient's hospital floor or unit, greater than 50% of which was counseling/coordinating care for left percutaneous nephrostomy    Patient ID: [name], male   DOB: [birth year], 74 y.o.   MRN: [id number]

## 2019-08-05 NOTE — Progress Notes (Signed)
Subjective: Patient reports that he is feeling okay.  No significant pain.  He was stable overnight.  Objective: Vital signs in last 24 hours: Temp:  [97.8 F (36.6 C)-101.2 F (38.4 C)] 97.8 F (36.6 C) (04/05 0648) Pulse Rate:  [66-97] 66 (04/05 0648) Resp:  [14-34] 20 (04/05 0648) BP: (106-150)/(63-87) 131/76 (04/05 0648) SpO2:  [93 %-97 %] 95 % (04/05 0648) Weight:  [120.4 kg] 120.4 kg (04/04 2230)  Intake/Output from previous day: 04/04 0701 - 04/05 0700 In: 2158.2 [P.O.:360; I.V.:698.2; IV Piggyback:1100] Out: 0  Intake/Output this shift: Total I/O In: 240 [P.O.:240] Out: 200 [Urine:200]  Physical Exam:  Constitutional: Vital signs reviewed. WD WN in NAD   Eyes: PERRL, No scleral icterus.   Cardiovascular: RRR Pulmonary/Chest: Normal efforty: Extremities: No cyanosis or edema   Lab Results: Recent Labs    08/04/19 1702 08/05/19 0515  HGB 14.2 12.9*  HCT 43.2 40.0   BMET Recent Labs    08/04/19 1702 08/05/19 0515  NA 141 140  K 3.4* 3.5  CL 109 108  CO2 20* 26  GLUCOSE 198* 148*  BUN 22 20  CREATININE 1.50* 1.50*  CALCIUM 8.6* 8.3*   Recent Labs    08/05/19 0856  INR 1.1   No results for input(s): LABURIN in the last 72 hours. Results for orders placed or performed during the hospital encounter of 08/04/19  Respiratory Panel by RT PCR (Flu A&B, Covid) - Nasopharyngeal Swab     Status: None   Collection Time: 08/04/19  7:21 PM   Specimen: Nasopharyngeal Swab  Result Value Ref Range Status   SARS Coronavirus 2 by RT PCR NEGATIVE NEGATIVE Final    Comment: (NOTE) SARS-CoV-2 target nucleic acids are NOT DETECTED. The SARS-CoV-2 RNA is generally detectable in upper respiratoy specimens during the acute phase of infection. The lowest concentration of SARS-CoV-2 viral copies this assay can detect is 131 copies/mL. A negative result does not preclude SARS-Cov-2 infection and should not be used as the sole basis for treatment or other patient  management decisions. A negative result may occur with  improper specimen collection/handling, submission of specimen other than nasopharyngeal swab, presence of viral mutation(s) within the areas targeted by this assay, and inadequate number of viral copies (<131 copies/mL). A negative result must be combined with clinical observations, patient history, and epidemiological information. The expected result is Negative. Fact Sheet for Patients:  PinkCheek.be Fact Sheet for Healthcare Providers:  GravelBags.it This test is not yet ap proved or cleared by the [place] FDA and  has been authorized for detection and/or diagnosis of SARS-CoV-2 by FDA under an Emergency Use Authorization (EUA). This EUA will remain  in effect (meaning this test can be used) for the duration of the COVID-19 declaration under Section 564(b)(1) of the Act, 21 U.S.C. section 360bbb-3(b)(1), unless the authorization is terminated or revoked sooner.    Influenza A by PCR NEGATIVE NEGATIVE Final   Influenza B by PCR NEGATIVE NEGATIVE Final    Comment: (NOTE) The Xpert Xpress SARS-CoV-2/FLU/RSV assay is intended as an aid in  the diagnosis of influenza from Nasopharyngeal swab specimens and  should not be used as a sole basis for treatment. Nasal washings and  aspirates are unacceptable for Xpert Xpress SARS-CoV-2/FLU/RSV  testing. Fact Sheet for Patients: PinkCheek.be Fact Sheet for Healthcare Providers: GravelBags.it This test is not yet approved or cleared by the [place] FDA and  has been authorized for detection and/or diagnosis of SARS-CoV-2 by  FDA under an  Emergency Use Authorization (EUA). This EUA will remain  in effect (meaning this test can be used) for the duration of the  Covid-19 declaration under Section 564(b)(1) of the Act, 21  U.S.C. section 360bbb-3(b)(1), unless the  authorization is  terminated or revoked. Performed at [facility], [place] [address] [place], [place] [address]     Studies/Results: DG Chest Portable 1 View  Result Date: 08/04/2019 CLINICAL DATA:  Fever EXAM: PORTABLE CHEST 1 VIEW COMPARISON:  06/09/2005 FINDINGS: Cardiomegaly. No confluent opacities, effusions or edema. No acute bony abnormality. IMPRESSION: Cardiomegaly.  No active disease. Electronically Signed   By: Rolm Baptise M.D.   On: 08/04/2019 17:43   DG Abd Portable 1 View  Result Date: 08/04/2019 CLINICAL DATA:  Fever, renal stone EXAM: PORTABLE ABDOMEN - 1 VIEW COMPARISON:  CT 02/05/2019 FINDINGS: Study limited by body habitus. Calcification projects over the left abdomen, likely left renal stone or possibly the previously seen left renal pelvic stone. No definite calcifications over the course of the ureters although small stones may be difficult to visualize. Nonobstructive bowel gas pattern. No organomegaly. IMPRESSION: Limited study due to body habitus. Calcification in the left abdomen could reflect the previously seen left renal or renal pelvic stone. Electronically Signed   By: Rolm Baptise M.D.   On: 08/04/2019 17:45   CT Renal Stone Study  Result Date: 08/04/2019 CLINICAL DATA:  Gross hematuria, history of kidney stones EXAM: CT ABDOMEN AND PELVIS WITHOUT CONTRAST TECHNIQUE: Multidetector CT imaging of the abdomen and pelvis was performed following the standard protocol without IV contrast. COMPARISON:  02/05/2019 FINDINGS: Lower chest: No acute abnormality. Hepatobiliary: Hepatic steatosis. Cholelithiasis. No biliary dilatation. Pancreas: Unremarkable. Spleen: Unremarkable. Adrenals/Urinary Tract: Adrenals are unremarkable. Renal cortical thinning. Stable small right upper pole renal cyst. Bilateral nonobstructing renal calculi are again identified measuring up to 3 mm. There is a 5 mm obstructing calculus within the proximal left ureter with resulting  hydroureteronephrosis. Poorly distended bladder is unremarkable. Stomach/Bowel: Stomach is within normal limits. Bowel is normal in caliber. Small bowel loops are closely apposed to the ventral abdominal wall. Mild diverticulosis. Vascular/Lymphatic: Aortic atherosclerosis. No enlarged abdominal or pelvic lymph nodes. No adenopathy. Reproductive: Marked prostate enlargement. Other: No ascites. Small fat containing inguinal hernias. Tiny fat containing left parasagittal ventral hernia no longer contains a bowel loop. Musculoskeletal: No acute osseous finding. IMPRESSION: 5 mm obstructing calculus of the proximal left ureter with hydroureteronephrosis. Additional nonobstructing renal calculi. Additional stable findings as detailed above. Electronically Signed   By: Macy Mis M.D.   On: 08/04/2019 19:21    Assessment/Plan:   Left proximal ureteral stone with hydronephrosis with associated infection.  The patient is stable and feeling better today.  Unfortunately, the patient received heparin this morning, so having a percutaneous drain placed will be impossible.  As he is stable, I think it is fine to wait until tomorrow.  I have spoken with Dr. Vernard Gambles regarding placement of a percutaneous tube who thinks that it would be possible.  I will allow the patient to eat.  I have held his heparin.   LOS: 1 day   Lillette Boxer Rajanae Mantia 08/05/2019, 10:04 AM

## 2019-08-05 NOTE — Progress Notes (Signed)
PROGRESS NOTE    [name]  YOM:[id number] DOB: [birth year] DOA: 08/04/2019 PCP: [name] Noon, MD   Brief Narrative: [name] is a 74 y.o. male with a history of renal stones, migraine headaches, morbid obesity. Patient presented secondary to left flank pain in the setting of left sided nephrolithiasis with associated infection.   Assessment & Plan:   Principal Problem:   infected Renal calculus, left Active Problems:   CKD (chronic kidney disease), stage III   Hypokalemia   Infected left renal stone Urine and blood cultures obtained. Empirically started on Ceftriaxone IV. -Continue Ceftriaxone IV -Urology recommendations: IR consult for nephrostomy -Follow-up urine and blood cultures  Sepsis Secondary to above. Met on admission.  AKI Baseline creatinine difficult to ascertain. Does not appear clear that patient has CKD. Elevated creatinine appears to be in relation to renal stones. Baseline is likely around 1. Creatinine of 1.50 on admission in setting of left renal stone. -Management above  Hypokalemia Mild. Resolved.  Morbid obestiy Body mass index is 40.36 kg/m.   DVT prophylaxis: SCDs Code Status:   Code Status: Full Code Family Communication: None at bedside Disposition Plan: Discharge pending specialist recommendations   Consultants:   Urology  Interventional radiology  Procedures:   None  Antimicrobials:  Ceftriaxone    Subjective: Left flank pain. No other issues  Objective: Vitals:   08/04/19 2230 08/04/19 2240 08/05/19 0648 08/05/19 1352  BP:  132/64 131/76 (!) 144/73  Pulse:  75 66 68  Resp:  (!) 21 20 20   Temp:  98.3 F (36.8 C) 97.8 F (36.6 C) 97.9 F (36.6 C)  TempSrc:  Oral Oral Oral  SpO2:  95% 95% 100%  Weight: 120.4 kg     Height: 5' 8"  (1.727 m)       Intake/Output Summary (Last 24 hours) at 08/05/2019 1525 Last data filed at 08/05/2019 0955 Gross per 24 hour  Intake 2398.23 ml  Output 200 ml    Net 2198.23 ml   Filed Weights   08/04/19 2230  Weight: 120.4 kg    Examination:  General exam: Appears calm and comfortable Respiratory system: Clear to auscultation. Respiratory effort normal. Cardiovascular system: S1 & S2 heard, RRR. No murmurs, rubs, gallops or clicks. Gastrointestinal system: Abdomen is nondistended, soft and mildly tender over left flank. No organomegaly or masses felt. Normal bowel sounds heard. Central nervous system: Alert and oriented. No focal neurological deficits. Extremities: No calf tenderness Skin: No cyanosis. No rashes Psychiatry: Judgement and insight appear normal. Mood & affect appropriate.    Data Reviewed: I have personally reviewed following labs and imaging studies  CBC: Recent Labs  Lab 08/04/19 1702 08/05/19 0515  WBC 17.2* 12.5*  NEUTROABS 14.9*  --   HGB 14.2 12.9*  HCT 43.2 40.0  MCV 86.6 87.3  PLT 271 414   Basic Metabolic Panel: Recent Labs  Lab 08/04/19 1702 08/05/19 0515  NA 141 140  K 3.4* 3.5  CL 109 108  CO2 20* 26  GLUCOSE 198* 148*  BUN 22 20  CREATININE 1.50* 1.50*  CALCIUM 8.6* 8.3*   GFR: Estimated Creatinine Clearance: 53.7 mL/min (A) (by C-G formula based on SCr of 1.5 mg/dL (H)). Liver Function Tests: Recent Labs  Lab 08/04/19 1702 08/05/19 0515  AST 16 13*  ALT 16 14  ALKPHOS 106 87  BILITOT 1.1 1.0  PROT 6.8 5.8*  ALBUMIN 3.3* 2.7*   No results for input(s): LIPASE, AMYLASE in the last 168 hours. No results for  input(s): AMMONIA in the last 168 hours. Coagulation Profile: Recent Labs  Lab 08/05/19 0856  INR 1.1   Cardiac Enzymes: No results for input(s): CKTOTAL, CKMB, CKMBINDEX, TROPONINI in the last 168 hours. BNP (last 3 results) No results for input(s): PROBNP in the last 8760 hours. HbA1C: No results for input(s): HGBA1C in the last 72 hours. CBG: No results for input(s): GLUCAP in the last 168 hours. Lipid Profile: No results for input(s): CHOL, HDL, LDLCALC, TRIG,  CHOLHDL, LDLDIRECT in the last 72 hours. Thyroid Function Tests: No results for input(s): TSH, T4TOTAL, FREET4, T3FREE, THYROIDAB in the last 72 hours. Anemia Panel: No results for input(s): VITAMINB12, FOLATE, FERRITIN, TIBC, IRON, RETICCTPCT in the last 72 hours. Sepsis Labs: Recent Labs  Lab 08/04/19 1702  LATICACIDVEN 2.1*    Recent Results (from the past 240 hour(s))  Culture, blood (routine x 2)     Status: None (Preliminary result)   Collection Time: 08/04/19  5:10 PM   Specimen: BLOOD LEFT FOREARM  Result Value Ref Range Status   Specimen Description   Final    BLOOD LEFT FOREARM Performed at [facility], [place] [address] [place], [place] [address]    Special Requests   Final    BOTTLES DRAWN AEROBIC AND ANAEROBIC Blood Culture results may not be optimal due to an excessive volume of blood received in culture bottles Performed at [place] [address] [place], [place] [address]    Culture   Final    NO GROWTH < 24 HOURS Performed at [place] [address], [place], [place] [address]    Report Status PENDING  Incomplete  Culture, blood (routine x 2)     Status: None (Preliminary result)   Collection Time: 08/04/19  5:15 PM   Specimen: BLOOD  Result Value Ref Range Status   Specimen Description   Final    BLOOD BLOOD RIGHT FOREARM Performed at [place] [address] [place], [place] [address]    Special Requests   Final    BOTTLES DRAWN AEROBIC AND ANAEROBIC Blood Culture adequate volume Performed at [place] [address], [place], [place] [address]    Culture   Final    NO GROWTH < 24 HOURS Performed at [place] [address] [place], [place] [address]    Report Status PENDING  Incomplete  Respiratory Panel by RT PCR (Flu A&B, Covid) - Nasopharyngeal Swab     Status: None   Collection Time: 08/04/19  7:21 PM   Specimen: Nasopharyngeal Swab    Result Value Ref Range Status   SARS Coronavirus 2 by RT PCR NEGATIVE NEGATIVE Final    Comment: (NOTE) SARS-CoV-2 target nucleic acids are NOT DETECTED. The SARS-CoV-2 RNA is generally detectable in upper respiratoy specimens during the acute phase of infection. The lowest concentration of SARS-CoV-2 viral copies this assay can detect is 131 copies/mL. A negative result does not preclude SARS-Cov-2 infection and should not be used as the sole basis for treatment or other patient management decisions. A negative result may occur with  improper specimen collection/handling, submission of specimen other than nasopharyngeal swab, presence of viral mutation(s) within the areas targeted by this assay, and inadequate number of viral copies (<131 copies/mL). A negative result must be combined with clinical observations, patient history, and epidemiological information. The expected result is Negative. Fact Sheet for Patients:  PinkCheek.be Fact Sheet for Healthcare Providers:  GravelBags.it This  test is not yet ap proved or cleared by the [place] and  has been authorized for detection and/or diagnosis of SARS-CoV-2 by FDA under an Emergency Use Authorization (EUA). This EUA will remain  in effect (meaning this test can be used) for the duration of the COVID-19 declaration under Section 564(b)(1) of the Act, 21 U.S.C. section 360bbb-3(b)(1), unless the authorization is terminated or revoked sooner.    Influenza A by PCR NEGATIVE NEGATIVE Final   Influenza B by PCR NEGATIVE NEGATIVE Final    Comment: (NOTE) The Xpert Xpress SARS-CoV-2/FLU/RSV assay is intended as an aid in  the diagnosis of influenza from Nasopharyngeal swab specimens and  should not be used as a sole basis for treatment. Nasal washings and  aspirates are unacceptable for Xpert Xpress SARS-CoV-2/FLU/RSV  testing. Fact Sheet for  Patients: PinkCheek.be Fact Sheet for Healthcare Providers: GravelBags.it This test is not yet approved or cleared by the [place] FDA and  has been authorized for detection and/or diagnosis of SARS-CoV-2 by  FDA under an Emergency Use Authorization (EUA). This EUA will remain  in effect (meaning this test can be used) for the duration of the  Covid-19 declaration under Section 564(b)(1) of the Act, 21  U.S.C. section 360bbb-3(b)(1), unless the authorization is  terminated or revoked. Performed at [facility], [place] [address], [place], [place] [address]          Radiology Studies: DG Chest Portable 1 View  Result Date: 08/04/2019 CLINICAL DATA:  Fever EXAM: PORTABLE CHEST 1 VIEW COMPARISON:  06/09/2005 FINDINGS: Cardiomegaly. No confluent opacities, effusions or edema. No acute bony abnormality. IMPRESSION: Cardiomegaly.  No active disease. Electronically Signed   By: Rolm Baptise M.D.   On: 08/04/2019 17:43   DG Abd Portable 1 View  Result Date: 08/04/2019 CLINICAL DATA:  Fever, renal stone EXAM: PORTABLE ABDOMEN - 1 VIEW COMPARISON:  CT 02/05/2019 FINDINGS: Study limited by body habitus. Calcification projects over the left abdomen, likely left renal stone or possibly the previously seen left renal pelvic stone. No definite calcifications over the course of the ureters although small stones may be difficult to visualize. Nonobstructive bowel gas pattern. No organomegaly. IMPRESSION: Limited study due to body habitus. Calcification in the left abdomen could reflect the previously seen left renal or renal pelvic stone. Electronically Signed   By: Rolm Baptise M.D.   On: 08/04/2019 17:45   CT Renal Stone Study  Result Date: 08/04/2019 CLINICAL DATA:  Gross hematuria, history of kidney stones EXAM: CT ABDOMEN AND PELVIS WITHOUT CONTRAST TECHNIQUE: Multidetector CT imaging of the abdomen and pelvis was performed  following the standard protocol without IV contrast. COMPARISON:  02/05/2019 FINDINGS: Lower chest: No acute abnormality. Hepatobiliary: Hepatic steatosis. Cholelithiasis. No biliary dilatation. Pancreas: Unremarkable. Spleen: Unremarkable. Adrenals/Urinary Tract: Adrenals are unremarkable. Renal cortical thinning. Stable small right upper pole renal cyst. Bilateral nonobstructing renal calculi are again identified measuring up to 3 mm. There is a 5 mm obstructing calculus within the proximal left ureter with resulting hydroureteronephrosis. Poorly distended bladder is unremarkable. Stomach/Bowel: Stomach is within normal limits. Bowel is normal in caliber. Small bowel loops are closely apposed to the ventral abdominal wall. Mild diverticulosis. Vascular/Lymphatic: Aortic atherosclerosis. No enlarged abdominal or pelvic lymph nodes. No adenopathy. Reproductive: Marked prostate enlargement. Other: No ascites. Small fat containing inguinal hernias. Tiny fat containing left parasagittal ventral hernia no longer contains a bowel loop. Musculoskeletal: No acute osseous finding. IMPRESSION: 5 mm obstructing calculus of the proximal left  ureter with hydroureteronephrosis. Additional nonobstructing renal calculi. Additional stable findings as detailed above. Electronically Signed   By: Macy Mis M.D.   On: 08/04/2019 19:21        Scheduled Meds: Continuous Infusions: . sodium chloride 20 mL/hr at 08/05/19 0920  . cefTRIAXone (ROCEPHIN)  IV       LOS: 1 day     Cordelia Poche, MD [facility] 08/05/2019, 3:25 PM  If 7PM-7AM, please contact night-coverage [email or web address]

## 2019-08-06 ENCOUNTER — Inpatient Hospital Stay (HOSPITAL_COMMUNITY): Payer: Medicare Other

## 2019-08-06 DIAGNOSIS — J9601 Acute respiratory failure with hypoxia: Secondary | ICD-10-CM

## 2019-08-06 DIAGNOSIS — R6889 Other general symptoms and signs: Secondary | ICD-10-CM

## 2019-08-06 DIAGNOSIS — E876 Hypokalemia: Secondary | ICD-10-CM | POA: Diagnosis not present

## 2019-08-06 DIAGNOSIS — N179 Acute kidney failure, unspecified: Secondary | ICD-10-CM

## 2019-08-06 DIAGNOSIS — N200 Calculus of kidney: Secondary | ICD-10-CM

## 2019-08-06 HISTORY — PX: IR NEPHROSTOMY PLACEMENT LEFT: IMG6063

## 2019-08-06 LAB — LACTIC ACID, PLASMA
Lactic Acid, Venous: 3.4 mmol/L (ref 0.5–1.9)
Lactic Acid, Venous: 1.9 mmol/L (ref 0.5–1.9)

## 2019-08-06 LAB — CBC WITH DIFFERENTIAL/PLATELET
Abs Immature Granulocytes: 0.27 10*3/uL — ABNORMAL HIGH (ref 0.00–0.07)
Basophils Absolute: 0.1 10*3/uL (ref 0.0–0.1)
Basophils Relative: 1 %
Eosinophils Absolute: 0.2 10*3/uL (ref 0.0–0.5)
Eosinophils Relative: 3 %
HCT: 39.7 % (ref 39.0–52.0)
Hemoglobin: 12.9 g/dL — ABNORMAL LOW (ref 13.0–17.0)
Immature Granulocytes: 3 %
Lymphocytes Relative: 12 %
Lymphs Abs: 1.1 10*3/uL (ref 0.7–4.0)
MCH: 28.5 pg (ref 26.0–34.0)
MCHC: 32.5 g/dL (ref 30.0–36.0)
MCV: 87.8 fL (ref 80.0–100.0)
Monocytes Absolute: 1.1 10*3/uL — ABNORMAL HIGH (ref 0.1–1.0)
Monocytes Relative: 13 %
Neutro Abs: 6 10*3/uL (ref 1.7–7.7)
Neutrophils Relative %: 68 %
Platelets: 225 10*3/uL (ref 150–400)
RBC: 4.52 MIL/uL (ref 4.22–5.81)
RDW: 14.1 % (ref 11.5–15.5)
WBC: 8.7 10*3/uL (ref 4.0–10.5)
nRBC: 0 % (ref 0.0–0.2)

## 2019-08-06 LAB — DIFFERENTIAL
Abs Immature Granulocytes: 0 10*3/uL (ref 0.00–0.07)
Band Neutrophils: 8 %
Basophils Absolute: 0 10*3/uL (ref 0.0–0.1)
Basophils Relative: 0 %
Eosinophils Absolute: 0 10*3/uL (ref 0.0–0.5)
Eosinophils Relative: 1 %
Lymphocytes Relative: 13 %
Lymphs Abs: 0.3 10*3/uL — ABNORMAL LOW (ref 0.7–4.0)
Monocytes Absolute: 0.1 10*3/uL (ref 0.1–1.0)
Monocytes Relative: 5 %
Myelocytes: 1 %
Neutro Abs: 1.8 10*3/uL (ref 1.7–7.7)
Neutrophils Relative %: 72 %

## 2019-08-06 LAB — CBC
HCT: 44.9 % (ref 39.0–52.0)
Hemoglobin: 14.3 g/dL (ref 13.0–17.0)
MCH: 28.3 pg (ref 26.0–34.0)
MCHC: 31.8 g/dL (ref 30.0–36.0)
MCV: 88.9 fL (ref 80.0–100.0)
Platelets: 221 10*3/uL (ref 150–400)
RBC: 5.05 MIL/uL (ref 4.22–5.81)
RDW: 14.2 % (ref 11.5–15.5)
WBC: 2.3 10*3/uL — ABNORMAL LOW (ref 4.0–10.5)
nRBC: 0.9 % — ABNORMAL HIGH (ref 0.0–0.2)

## 2019-08-06 LAB — BASIC METABOLIC PANEL
Anion gap: 9 (ref 5–15)
BUN: 17 mg/dL (ref 8–23)
CO2: 21 mmol/L — ABNORMAL LOW (ref 22–32)
Calcium: 8.1 mg/dL — ABNORMAL LOW (ref 8.9–10.3)
Chloride: 110 mmol/L (ref 98–111)
Creatinine, Ser: 1.21 mg/dL (ref 0.61–1.24)
GFR calc Af Amer: >60 mL/min (ref >60)
GFR calc non Af Amer: 58 mL/min — ABNORMAL LOW (ref >60)
Glucose, Bld: 110 mg/dL — ABNORMAL HIGH (ref 70–99)
Potassium: 3.7 mmol/L (ref 3.5–5.1)
Sodium: 140 mmol/L (ref 135–145)

## 2019-08-06 LAB — TROPONIN I (HIGH SENSITIVITY)
Troponin I (High Sensitivity): 47 ng/L — ABNORMAL HIGH (ref <18)
Troponin I (High Sensitivity): 589 ng/L (ref <18)
Troponin I (High Sensitivity): 1906 ng/L (ref <18)
Troponin I (High Sensitivity): 1707 ng/L (ref <18)

## 2019-08-06 LAB — TYPE AND SCREEN
ABO/RH(D): A NEG
Antibody Screen: NEGATIVE

## 2019-08-06 LAB — MRSA PCR SCREENING: MRSA by PCR: NEGATIVE

## 2019-08-06 LAB — PROCALCITONIN: Procalcitonin: 2.03 ng/mL

## 2019-08-06 MED ORDER — SODIUM CHLORIDE 0.9 % IV SOLN
2.0000 g | Freq: Once | INTRAVENOUS | Status: AC
  Administered 2019-08-06: 2 g via INTRAVENOUS
  Filled 2019-08-06: qty 2

## 2019-08-06 MED ORDER — HEPARIN SODIUM (PORCINE) 5000 UNIT/ML IJ SOLN
5000.0000 [IU] | Freq: Three times a day (TID) | INTRAMUSCULAR | Status: DC
  Administered 2019-08-06 – 2019-08-09 (×8): 5000 [IU] via SUBCUTANEOUS
  Filled 2019-08-06 (×8): qty 1

## 2019-08-06 MED ORDER — ORAL CARE MOUTH RINSE
15.0000 mL | Freq: Two times a day (BID) | OROMUCOSAL | Status: DC
  Administered 2019-08-07 – 2019-08-12 (×9): 15 mL via OROMUCOSAL

## 2019-08-06 MED ORDER — LIDOCAINE-EPINEPHRINE 1 %-1:100000 IJ SOLN
INTRAMUSCULAR | Status: AC
  Filled 2019-08-06: qty 1

## 2019-08-06 MED ORDER — MIDAZOLAM HCL 2 MG/2ML IJ SOLN
INTRAMUSCULAR | Status: AC
  Filled 2019-08-06: qty 2

## 2019-08-06 MED ORDER — SODIUM CHLORIDE 0.9 % IV BOLUS
1000.0000 mL | Freq: Once | INTRAVENOUS | Status: AC
  Administered 2019-08-06: 1000 mL via INTRAVENOUS

## 2019-08-06 MED ORDER — VANCOMYCIN HCL 1250 MG/250ML IV SOLN
1250.0000 mg | INTRAVENOUS | Status: DC
  Administered 2019-08-07: 1250 mg via INTRAVENOUS
  Filled 2019-08-06 (×2): qty 250

## 2019-08-06 MED ORDER — VANCOMYCIN HCL 10 G IV SOLR
2500.0000 mg | Freq: Once | INTRAVENOUS | Status: AC
  Administered 2019-08-06: 2500 mg via INTRAVENOUS
  Filled 2019-08-06: qty 2500

## 2019-08-06 MED ORDER — VANCOMYCIN HCL 1250 MG/250ML IV SOLN: 1250.0000 mg | Freq: Two times a day (BID) | INTRAVENOUS | Status: DC

## 2019-08-06 MED ORDER — LIDOCAINE HCL (PF) 1 % IJ SOLN
INTRAMUSCULAR | Status: AC | PRN
  Administered 2019-08-06: 10 mL via INTRADERMAL

## 2019-08-06 MED ORDER — SODIUM CHLORIDE 0.9 % IV SOLN
2.0000 g | Freq: Three times a day (TID) | INTRAVENOUS | Status: DC
  Administered 2019-08-06 – 2019-08-12 (×17): 2 g via INTRAVENOUS
  Filled 2019-08-06 (×19): qty 2

## 2019-08-06 MED ORDER — CHLORHEXIDINE GLUCONATE CLOTH 2 % EX PADS
6.0000 | MEDICATED_PAD | Freq: Every day | CUTANEOUS | Status: DC
  Administered 2019-08-06 – 2019-08-11 (×6): 6 via TOPICAL

## 2019-08-06 MED ORDER — MORPHINE SULFATE (PF) 2 MG/ML IV SOLN
2.0000 mg | Freq: Once | INTRAVENOUS | Status: AC
  Administered 2019-08-06: 2 mg via INTRAVENOUS

## 2019-08-06 MED ORDER — FENTANYL CITRATE (PF) 100 MCG/2ML IJ SOLN
25.0000 ug | Freq: Once | INTRAMUSCULAR | Status: AC
  Administered 2019-08-06: 25 ug via INTRAVENOUS
  Filled 2019-08-06: qty 2

## 2019-08-06 MED ORDER — SODIUM CHLORIDE 0.9% FLUSH
5.0000 mL | Freq: Three times a day (TID) | INTRAVENOUS | Status: DC
  Administered 2019-08-06 – 2019-08-11 (×13): 5 mL

## 2019-08-06 MED ORDER — IOHEXOL 300 MG/ML  SOLN
80.0000 mL | Freq: Once | INTRAMUSCULAR | Status: AC | PRN
  Administered 2019-08-06: 8 mL

## 2019-08-06 MED ORDER — TRAMADOL HCL 50 MG PO TABS
50.0000 mg | ORAL_TABLET | Freq: Four times a day (QID) | ORAL | Status: AC | PRN
  Administered 2019-08-06 – 2019-08-07 (×2): 50 mg via ORAL
  Filled 2019-08-06 (×2): qty 1

## 2019-08-06 MED ORDER — MIDAZOLAM HCL 2 MG/2ML IJ SOLN
INTRAMUSCULAR | Status: AC | PRN
  Administered 2019-08-06 (×2): 1 mg via INTRAVENOUS

## 2019-08-06 MED ORDER — CEFAZOLIN SODIUM-DEXTROSE 2-4 GM/100ML-% IV SOLN
INTRAVENOUS | Status: AC
  Administered 2019-08-06: 2000 mg
  Filled 2019-08-06: qty 100

## 2019-08-06 MED ORDER — FENTANYL CITRATE (PF) 100 MCG/2ML IJ SOLN
INTRAMUSCULAR | Status: AC | PRN
  Administered 2019-08-06 (×2): 50 ug via INTRAVENOUS

## 2019-08-06 MED ORDER — FENTANYL CITRATE (PF) 100 MCG/2ML IJ SOLN
INTRAMUSCULAR | Status: AC
  Filled 2019-08-06: qty 2

## 2019-08-06 MED ORDER — LACTATED RINGERS IV SOLN: INTRAVENOUS | Status: AC

## 2019-08-06 NOTE — Progress Notes (Signed)
PROGRESS NOTE    [name]  [id number] DOB: [birth year] DOA: 08/04/2019 PCP: [name], MD   Brief Narrative: [name] is a 74 y.o. male with a history of renal stones, migraine headaches, morbid obesity. Patient presented secondary to left flank pain in the setting of left sided nephrolithiasis with associated infection.   Assessment & Plan:   Principal Problem:   infected Renal calculus, left Active Problems:   AKI (acute kidney injury) ([place])   Hypokalemia   Infected left renal stone Urine and blood cultures obtained. Empirically started on Ceftriaxone IV. Blood cultures no growth to date. Urine culture still pending; checked with microbiology at [facility] and no sample was ever received. -Continue Ceftriaxone IV -Urology recommendations: IR consult for nephrostomy -Follow-up blood cultures  Sepsis Secondary to above. Met on admission.  AKI Baseline creatinine difficult to ascertain. Does not appear clear that patient has CKD. Elevated creatinine appears to be in relation to renal stones. Baseline is likely around 1. Creatinine of 1.50 on admission in setting of left renal stone. Creatinine trending down. -Management above  Hypokalemia Mild. Resolved.  Morbid obestiy Body mass index is 40.36 kg/m.   DVT prophylaxis: SCDs Code Status:   Code Status: Full Code Family Communication: None at bedside Disposition Plan: Discharge pending specialist recommendations   Consultants:   Urology  Interventional radiology  Procedures:   None  Antimicrobials:  Ceftriaxone    Subjective: Left flank pain unchanged. No nausea or vomiting. No other concerns.  Objective: Vitals:   08/05/19 0648 08/05/19 1352 08/05/19 2056 08/06/19 0532  BP: 131/76 (!) 144/73 124/69 135/70  Pulse: 66 68 65 64  Resp: 20 20 20 20   Temp: 97.8 F (36.6 C) 97.9 F (36.6 C) 98 F (36.7 C) 98.4 F (36.9 C)  TempSrc: Oral Oral Oral Oral  SpO2: 95% 100% 98% 98%    Weight:      Height:        Intake/Output Summary (Last 24 hours) at 08/06/2019 0904 Last data filed at 08/06/2019 0824 Gross per 24 hour  Intake 1421.96 ml  Output 750 ml  Net 671.96 ml   Filed Weights   08/04/19 2230  Weight: 120.4 kg    Examination:  General exam: Appears calm and comfortable Respiratory system: Clear to auscultation. Respiratory effort normal. Cardiovascular system: S1 & S2 heard, RRR. No murmurs, rubs, gallops or clicks. Gastrointestinal system: Abdomen is protuberant/distended, soft and nontender. Normal bowel sounds heard. Central nervous system: Alert and oriented. No focal neurological deficits. Extremities: LE edema. No calf tenderness Skin: No cyanosis. No rashes Psychiatry: Judgement and insight appear normal. Mood & affect appropriate.     Data Reviewed: I have personally reviewed following labs and imaging studies  CBC: Recent Labs  Lab 08/04/19 1702 08/05/19 0515 08/06/19 0454  WBC 17.2* 12.5* 8.7  NEUTROABS 14.9*  --  6.0  HGB 14.2 12.9* 12.9*  HCT 43.2 40.0 39.7  MCV 86.6 87.3 87.8  PLT 271 212 270   Basic Metabolic Panel: Recent Labs  Lab 08/04/19 1702 08/05/19 0515 08/06/19 0454  NA 141 140 140  K 3.4* 3.5 3.7  CL 109 108 110  CO2 20* 26 21*  GLUCOSE 198* 148* 110*  BUN 22 20 17   CREATININE 1.50* 1.50* 1.21  CALCIUM 8.6* 8.3* 8.1*   GFR: Estimated Creatinine Clearance: 66.6 mL/min (by C-G formula based on SCr of 1.21 mg/dL). Liver Function Tests: Recent Labs  Lab 08/04/19 1702 08/05/19 0515  AST 16 13*  ALT  16 14  ALKPHOS 106 87  BILITOT 1.1 1.0  PROT 6.8 5.8*  ALBUMIN 3.3* 2.7*   No results for input(s): LIPASE, AMYLASE in the last 168 hours. No results for input(s): AMMONIA in the last 168 hours. Coagulation Profile: Recent Labs  Lab 08/05/19 0856  INR 1.1   Cardiac Enzymes: No results for input(s): CKTOTAL, CKMB, CKMBINDEX, TROPONINI in the last 168 hours. BNP (last 3 results) No results for  input(s): PROBNP in the last 8760 hours. HbA1C: No results for input(s): HGBA1C in the last 72 hours. CBG: No results for input(s): GLUCAP in the last 168 hours. Lipid Profile: No results for input(s): CHOL, HDL, LDLCALC, TRIG, CHOLHDL, LDLDIRECT in the last 72 hours. Thyroid Function Tests: No results for input(s): TSH, T4TOTAL, FREET4, T3FREE, THYROIDAB in the last 72 hours. Anemia Panel: No results for input(s): VITAMINB12, FOLATE, FERRITIN, TIBC, IRON, RETICCTPCT in the last 72 hours. Sepsis Labs: Recent Labs  Lab 08/04/19 1702  LATICACIDVEN 2.1*    Recent Results (from the past 240 hour(s))  Culture, blood (routine x 2)     Status: None (Preliminary result)   Collection Time: 08/04/19  5:10 PM   Specimen: BLOOD LEFT FOREARM  Result Value Ref Range Status   Specimen Description   Final    BLOOD LEFT FOREARM Performed at [facility], [place] [address] [place], [place] [address]    Special Requests   Final    BOTTLES DRAWN AEROBIC AND ANAEROBIC Blood Culture results may not be optimal due to an excessive volume of blood received in culture bottles Performed at [facility] [address] [place], [place] [address]    Culture   Final    NO GROWTH 2 DAYS Performed at [facility] [address], [place], [place] [address]    Report Status PENDING  Incomplete  Culture, blood (routine x 2)     Status: None (Preliminary result)   Collection Time: 08/04/19  5:15 PM   Specimen: BLOOD  Result Value Ref Range Status   Specimen Description   Final    BLOOD BLOOD RIGHT FOREARM Performed at [facility] [address], [place], [place] [address]    Special Requests   Final    BOTTLES DRAWN AEROBIC AND ANAEROBIC Blood Culture adequate volume Performed at [facility] [address], [place], [place] [address]    Culture   Final    NO GROWTH 2 DAYS Performed at [facility]  [address] [place], [place] [address]    Report Status PENDING  Incomplete  Respiratory Panel by RT PCR (Flu A&B, Covid) - Nasopharyngeal Swab     Status: None   Collection Time: 08/04/19  7:21 PM   Specimen: Nasopharyngeal Swab  Result Value Ref Range Status   SARS Coronavirus 2 by RT PCR NEGATIVE NEGATIVE Final    Comment: (NOTE) SARS-CoV-2 target nucleic acids are NOT DETECTED. The SARS-CoV-2 RNA is generally detectable in upper respiratoy specimens during the acute phase of infection. The lowest concentration of SARS-CoV-2 viral copies this assay can detect is 131 copies/mL. A negative result does not preclude SARS-Cov-2 infection and should not be used as the sole basis for treatment or other patient management decisions. A negative result may occur with  improper specimen collection/handling, submission of specimen other than nasopharyngeal swab, presence of viral mutation(s) within the areas targeted by this assay, and inadequate number of viral copies (<131 copies/mL). A negative  result must be combined with clinical observations, patient history, and epidemiological information. The expected result is Negative. Fact Sheet for Patients:  PinkCheek.be Fact Sheet for Healthcare Providers:  GravelBags.it This test is not yet ap proved or cleared by the [place] FDA and  has been authorized for detection and/or diagnosis of SARS-CoV-2 by FDA under an Emergency Use Authorization (EUA). This EUA will remain  in effect (meaning this test can be used) for the duration of the COVID-19 declaration under Section 564(b)(1) of the Act, 21 U.S.C. section 360bbb-3(b)(1), unless the authorization is terminated or revoked sooner.    Influenza A by PCR NEGATIVE NEGATIVE Final   Influenza B by PCR NEGATIVE NEGATIVE Final    Comment: (NOTE) The Xpert Xpress SARS-CoV-2/FLU/RSV assay is intended as an aid in  the diagnosis of influenza  from Nasopharyngeal swab specimens and  should not be used as a sole basis for treatment. Nasal washings and  aspirates are unacceptable for Xpert Xpress SARS-CoV-2/FLU/RSV  testing. Fact Sheet for Patients: PinkCheek.be Fact Sheet for Healthcare Providers: GravelBags.it This test is not yet approved or cleared by the [place] FDA and  has been authorized for detection and/or diagnosis of SARS-CoV-2 by  FDA under an Emergency Use Authorization (EUA). This EUA will remain  in effect (meaning this test can be used) for the duration of the  Covid-19 declaration under Section 564(b)(1) of the Act, 21  U.S.C. section 360bbb-3(b)(1), unless the authorization is  terminated or revoked. Performed at [facility], [place] [address], [place], [place] [address]          Radiology Studies: DG Chest Portable 1 View  Result Date: 08/04/2019 CLINICAL DATA:  Fever EXAM: PORTABLE CHEST 1 VIEW COMPARISON:  06/09/2005 FINDINGS: Cardiomegaly. No confluent opacities, effusions or edema. No acute bony abnormality. IMPRESSION: Cardiomegaly.  No active disease. Electronically Signed   By: Rolm Baptise M.D.   On: 08/04/2019 17:43   DG Abd Portable 1 View  Result Date: 08/04/2019 CLINICAL DATA:  Fever, renal stone EXAM: PORTABLE ABDOMEN - 1 VIEW COMPARISON:  CT 02/05/2019 FINDINGS: Study limited by body habitus. Calcification projects over the left abdomen, likely left renal stone or possibly the previously seen left renal pelvic stone. No definite calcifications over the course of the ureters although small stones may be difficult to visualize. Nonobstructive bowel gas pattern. No organomegaly. IMPRESSION: Limited study due to body habitus. Calcification in the left abdomen could reflect the previously seen left renal or renal pelvic stone. Electronically Signed   By: Rolm Baptise M.D.   On: 08/04/2019 17:45   CT Renal Stone  Study  Result Date: 08/04/2019 CLINICAL DATA:  Gross hematuria, history of kidney stones EXAM: CT ABDOMEN AND PELVIS WITHOUT CONTRAST TECHNIQUE: Multidetector CT imaging of the abdomen and pelvis was performed following the standard protocol without IV contrast. COMPARISON:  02/05/2019 FINDINGS: Lower chest: No acute abnormality. Hepatobiliary: Hepatic steatosis. Cholelithiasis. No biliary dilatation. Pancreas: Unremarkable. Spleen: Unremarkable. Adrenals/Urinary Tract: Adrenals are unremarkable. Renal cortical thinning. Stable small right upper pole renal cyst. Bilateral nonobstructing renal calculi are again identified measuring up to 3 mm. There is a 5 mm obstructing calculus within the proximal left ureter with resulting hydroureteronephrosis. Poorly distended bladder is unremarkable. Stomach/Bowel: Stomach is within normal limits. Bowel is normal in caliber. Small bowel loops are closely apposed to the ventral abdominal wall. Mild diverticulosis. Vascular/Lymphatic: Aortic atherosclerosis. No enlarged abdominal or pelvic lymph nodes. No adenopathy. Reproductive: Marked prostate enlargement. Other: No ascites. Small  fat containing inguinal hernias. Tiny fat containing left parasagittal ventral hernia no longer contains a bowel loop. Musculoskeletal: No acute osseous finding. IMPRESSION: 5 mm obstructing calculus of the proximal left ureter with hydroureteronephrosis. Additional nonobstructing renal calculi. Additional stable findings as detailed above. Electronically Signed   By: Macy Mis M.D.   On: 08/04/2019 19:21        Scheduled Meds: Continuous Infusions: . sodium chloride 100 mL/hr at 08/06/19 0824  . cefTRIAXone (ROCEPHIN)  IV 200 mL/hr at 08/05/19 1800     LOS: 2 days     Cordelia Poche, MD [facility] 08/06/2019, 9:04 AM  If 7PM-7AM, please contact night-coverage [email or web address]

## 2019-08-06 NOTE — Progress Notes (Signed)
CRITICAL VALUE ALERT  Critical Value: troponin 1906  Date & Time Notied: 4/6 2100  Provider Notified:Denny paged with values  Orders Received/Actions taken:none at this time, ECHO ordered earlier

## 2019-08-06 NOTE — Progress Notes (Signed)
MEDICATION-RELATED CONSULT NOTE   IR Procedure Consult - Anticoagulant/Antiplatelet PTA/Inpatient Med List Review by Pharmacist    Procedure: placement L PCN tube    Completed: 4/6 @ 1300  Post-Procedural bleeding risk per IR MD assessment: STANDARD  Antithrombotic medications on inpatient or PTA profile prior to procedure:  SQ heparin    Recommended restart time per IR Post-Procedure Guidelines:  Day 0 and >/= 6 hrs post-procedure   Other considerations:  none   Plan:     Resume SQ heparin 5000 units TID starting [place], PharmD, BCPS [phone] 08/06/2019, 4:41 PM

## 2019-08-06 NOTE — Progress Notes (Signed)
CRITICAL VALUE ALERT  Critical Value:   Troponin 589  Date & Time Notied:  08/06/2019 1626   Provider Notified: Ewing Schlein   Orders Received/Actions taken: Serial troponin and ECHO on 4/7. EKG already completed.

## 2019-08-06 NOTE — Progress Notes (Signed)
Pharmacy Antibiotic Note  [name] is a 74 y.o. male with kidney stone presented to the ED on 08/04/2019 with c/o fever and generalized weakness. He was started on ceftriaxone on admission for UTI and underwent perc nephrostomy tube placed on 4/6 for left proximal ureteral stone with hydronephrosis.  Pharmacy was consulted on 4/6 to broaden abx to cefepime and vancomycin for suspected sepsis.  Today, 08/06/2019: - afeb, wbc wnl -  scr down 1.21 (crcl~67)   Plan: - d/c ceftriaxone - vancomycin 2500 mg IV x1, then 1250 mg IV q24h for est AUC 446 - cefepime 2gm IV q8h - monitor renal function closely  ________________________________________  Height: 5\' 8"  (172.7 cm) Weight: 120.4 kg (265 lb 6.9 oz) IBW/kg (Calculated) : 68.4  Temp (24hrs), Avg:98.1 F (36.7 C), Min:97.9 F (36.6 C), Max:98.4 F (36.9 C)  Recent Labs  Lab 08/04/19 1702 08/05/19 0515 08/06/19 0454  WBC 17.2* 12.5* 8.7  CREATININE 1.50* 1.50* 1.21  LATICACIDVEN 2.1*  --   --     Estimated Creatinine Clearance: 66.6 mL/min (by C-G formula based on SCr of 1.21 mg/dL).    Allergies  Allergen Reactions  . Codeine     Bad Headache     Antimicrobials this admission:  4/5 CTX>> 4/6 4/6 vanc>> 4/6 cefepime>>  Dose adjustments this admission:  --  Microbiology results:  4/5 UA: rare bacteria, large leuk, >50 WBC 4/6 BCx x2:   Thank you for allowing pharmacy to be a part of this patient's care.  Lynelle Doctor 08/06/2019 1:56 PM

## 2019-08-06 NOTE — Procedures (Signed)
Interventional Radiology Procedure Note  Procedure: Placement of a LEFT 57F percutaneous nephrostomy tube.    Complications: None  Estimated Blood Loss: None  Recommendations: - Tube to gravity drainage   Signed,  Criselda Peaches, MD

## 2019-08-06 NOTE — Consult Note (Signed)
NAME:  [name], MRN:  [id number], DOB:  Nov 12, 1945, LOS: 2 ADMISSION DATE:  08/04/2019, CONSULTATION DATE: 08/06/19 REFERRING MD: Dr. Lonny Prude, CHIEF COMPLAINT: Left Flank Pain     Brief History   74 y/o M who presented 4/4 with complaints of left flank pain in the setting of obstructing ureteral calculus.  S/P left percutaneous nephrostomy tube on 4/6 with concern for worsening sepsis.    History of present illness   74 y/o M who presented to [facility] on 4/4 with reports of left flank pain, fever, nausea, vomiting and weakness.  The patient is followed by [facility] and was aware of a left sided renal calculi. He was unable to be stented due to an enlarged prostate and the stone was too large to pass.  Additionally, there were concerns about nephrostomy tube placement due to body habitus in the past.  UA on presentation was concerning for UTI. Labs notable for AKI, leukocytosis.  The patient was admitted per [facility]. Urology was consulted with recommendations for percutaneous drain placement.  The patient was treated with cefepime and vancomycin for sepsis thought secondary to obstructing calculus / UTI.  Blood cultures are pending. Unfortunately, urine culture from admission does not appear to have made it to the lab.   On 4/6, he underwent percutaneous nephrostomy tube placement.  Post placement rapid response was called for patient complaints of "I'm going to die",  shaking chills / rigors, tachycardia, tachypnea and concern for sepsis. He was found cyanotic with saturations in the 70's which improved with O2.  RRT was unable to get an accurate BP / temperature.  He was transferred to SDU for further assessment. CXR negative for acute process.    PCCM consulted for evaluation.    Past Medical History  Obesity  Migraines  Renal Calculi  CKD III  Colon Polyps  Significant Hospital Events   4/04 Admit with obstructing renal calculi 4/06 IR for percutaneous nephrostomy tube placement, to  SDU post placement with sepsis  Consults:  PCCM   Procedures:    Significant Diagnostic Tests:   CT Renal Stone Study 4/4 >> 5 mm obstructing calculus of the proximal ureter with hydroureteronephrosis, additional non-obstructing renal calculi  Micro Data:  Influenza A/B 4/4 >> negative  COVID 4/4 >> negative  BCx2 4/4 >>  UC 4/6 >>   Antimicrobials:  Ceftriaxone 4/4 >> 4/6  Vanco 4/6 >>  Cefepime 4/6 >>  Interim history/subjective:  Rapid response RN reports pt initially found mottled, cool, tachycardic.    Objective   Blood pressure (!) 225/193, pulse (!) 126, temperature 97.9 F (36.6 C), temperature source Oral, resp. rate (!) 24, height 5\' 8"  (1.727 m), weight 120.4 kg, SpO2 100 %.        Intake/Output Summary (Last 24 hours) at 08/06/2019 1401 Last data filed at 08/06/2019 1033 Gross per 24 hour  Intake 1181.96 ml  Output 1050 ml  Net 131.96 ml   Filed Weights   08/04/19 2230  Weight: 120.4 kg    Examination: General: ill appearing obese male lying in bed, shaking chills noted HEENT: MM pink/moist, pupils =/reactive  Neuro: oriented to self, place. Unable to state time.  Speech clear. MAE/normal strength CV: s1s2 RRR, ST on monitor, no m/r/g PULM: tachypnea, abdominal accessory muscle use but no overt distress, clear breath sounds bilaterally, slightly diminished on left  GI: soft, bsx4 active, left percutaneous nephrostomy drain with pink clear fluid  Extremities: warm/dry, BLE 1-2+ pitting edema  Skin: no rashes  or lesions  Resolved Hospital Problem list      Assessment & Plan:   Severe Sepsis secondary to Obstructing Calculi  No organism specified.   -monitor in SDU  -continue empiric cefepime, vancomycin  -await urine culture -follow blood cultures to maturity  -assess PCT, troponin, EKG, lactate -IV bolus x1 then LR at 168ml/hr until 4/7 0900  Dyspnea, Acute Hypoxemia  Acutely decompensated in setting of sepsis -wean O2 to off for sats >  90%.  Anticipate he will come off O2 quickly given recovery after transfer to SDU -CXR images reviewed, no acute infiltrate or pneumothorax   AKI on CKD secondary to Left Renal Calculi s/p Percutaneous Nephrostomy Drainage  Left Flank Pain  -defer management to Urology / IR for renal calculi, perc drain -assess CBC now  -follow drain output closely  -Trend BMP / urinary output -Replace electrolytes as indicated -Avoid nephrotoxic agents, ensure adequate renal perfusion -PRN morphine for pain  Best practice:  Diet: heart healthy as tolerated   Pain/Anxiety/Delirium protocol (if indicated): pain control per primary  VAP protocol (if indicated): n/a  DVT prophylaxis: SCD's  GI prophylaxis: n/a Glucose control: per primary Mobility: bed rest  Code Status: Full Code  Family Communication: per primary  Disposition: SDU   Labs   CBC: Recent Labs  Lab 08/04/19 1702 08/05/19 0515 08/06/19 0454  WBC 17.2* 12.5* 8.7  NEUTROABS 14.9*  --  6.0  HGB 14.2 12.9* 12.9*  HCT 43.2 40.0 39.7  MCV 86.6 87.3 87.8  PLT 271 212 [id number]    Basic Metabolic Panel: Recent Labs  Lab 08/04/19 1702 08/05/19 0515 08/06/19 0454  NA 141 140 140  K 3.4* 3.5 3.7  CL 109 108 110  CO2 20* 26 21*  GLUCOSE 198* 148* 110*  BUN 22 20 17   CREATININE 1.50* 1.50* 1.21  CALCIUM 8.6* 8.3* 8.1*   GFR: Estimated Creatinine Clearance: 66.6 mL/min (by C-G formula based on SCr of 1.21 mg/dL). Recent Labs  Lab 08/04/19 1702 08/05/19 0515 08/06/19 0454  WBC 17.2* 12.5* 8.7  LATICACIDVEN 2.1*  --   --     Liver Function Tests: Recent Labs  Lab 08/04/19 1702 08/05/19 0515  AST 16 13*  ALT 16 14  ALKPHOS 106 87  BILITOT 1.1 1.0  PROT 6.8 5.8*  ALBUMIN 3.3* 2.7*   No results for input(s): LIPASE, AMYLASE in the last 168 hours. No results for input(s): AMMONIA in the last 168 hours.  ABG No results found for: PHART, PCO2ART, PO2ART, HCO3, TCO2, ACIDBASEDEF, O2SAT   Coagulation Profile: Recent  Labs  Lab 08/05/19 0856  INR 1.1    Cardiac Enzymes: No results for input(s): CKTOTAL, CKMB, CKMBINDEX, TROPONINI in the last 168 hours.  HbA1C: No results found for: HGBA1C  CBG: No results for input(s): GLUCAP in the last 168 hours.  Review of Systems: Positives in bold  Gen: Denies fever, chills, weight change, fatigue, night sweats HEENT: Denies blurred vision, double vision, hearing loss, tinnitus, sinus congestion, rhinorrhea, sore throat, neck stiffness, dysphagia PULM: Denies shortness of breath, cough, sputum production, hemoptysis, wheezing CV: Denies chest pain, edema, orthopnea, paroxysmal nocturnal dyspnea, palpitations GI: Denies abdominal pain, nausea, vomiting, diarrhea, hematochezia, melena, constipation, change in bowel habits GU: Denies dysuria, hematuria, polyuria, oliguria, urethral discharge Endocrine: Denies hot or cold intolerance, polyuria, polyphagia or appetite change Derm: Denies rash, dry skin, scaling or peeling skin change Heme: Denies easy bruising, bleeding, bleeding gums Neuro: Denies headache, numbness, weakness, slurred speech, loss of memory  or consciousness   Past Medical History  He,  has a past medical history of Chronic kidney disease, High frequency hearing loss, History of migraine headaches, colonic polyps, Obesity, Seborrheic dermatitis, and Vitiligo.   Surgical History    Past Surgical History:  Procedure Laterality Date  . COLON SURGERY  01/2001  . CYSTOSCOPY/URETEROSCOPY/HOLMIUM LASER/STENT PLACEMENT Left 02/04/2019   Procedure: CYSTOSCOPY Left retrograde and attempted left ureteroscopy;  Surgeon: Raynelle Bring, MD;  Location: [facility];  Service: Urology;  Laterality: Left;  ONLY NEEDS 60 MIN  . FRACTURE SURGERY    . IR NEPHROSTOMY PLACEMENT LEFT  02/05/2019  . IR NEPHROSTOMY PLACEMENT LEFT  08/06/2019  . TONSILLECTOMY AND ADENOIDECTOMY       Social History   reports that he quit smoking about 24 years ago. His smoking use  included cigarettes. He has a 40.00 pack-year smoking history. He has never used smokeless tobacco. He reports previous alcohol use. He reports that he does not use drugs.   Family History   His family history is not on file.   Allergies Allergies  Allergen Reactions  . Codeine     Bad Headache     Home Medications  Prior to Admission medications   Medication Sig Start Date End Date Taking? Authorizing Provider  cefdinir (OMNICEF) 300 MG capsule Take 1 capsule (300 mg total) by mouth 2 (two) times daily. Patient not taking: Reported on 08/04/2019 02/06/19   Raynelle Bring, MD  oxyCODONE-acetaminophen (PERCOCET) 5-325 MG tablet Take 1 tablet by mouth every 4 (four) hours as needed for severe pain. Patient not taking: Reported on 08/04/2019 02/06/19 02/06/20  Raynelle Bring, MD     Critical care time: n/a    Noe Gens, MSN, NP-C  Pulmonary & Critical Care 08/06/2019, 2:01 PM   Please see [email or web address] for pager details.

## 2019-08-06 NOTE — Progress Notes (Signed)
Paged by nurse at 1:14PM with concern that patient has increased WOB and new oxygen requirement at 5 L via nasal canula. Patient is recently returned from left sided nephrostomy tube placement by IR under conscious sedation with Versed. No fluids given per chart review. Upon arrival to bedside shortly after page, patient noted to have significant tachypnea, accessory muscle usage, rigors. Patient with a feeling of impending doom.  Vitals:   08/06/19 1228 08/06/19 1336  BP: 138/76 (!) 225/193  Pulse: 66 (!) 126  Resp: 18 (!) 24  Temp: 98 F (36.7 C) 97.9 F (36.6 C)  SpO2: 97% 100%    General exam: Appears in distress Respiratory system: diminished, no rales or wheezing. Increased accessory muscle usage, tachypnea. Cardiovascular system: S1 & S2 heard, Tachycardia, normal rhythm. No murmurs, rubs, gallops or clicks. Cyanosis of fingertips bilaterally. Sluggish capillary refill.  Gastrointestinal system: Abdomen is protuberant, soft and nontender. Normal bowel sounds heard. Central nervous system: Alert and oriented. No focal neurological deficits. Extremities: No edema. No calf tenderness Skin: No cyanosis. Skin appears mottled around back and LE.  Psychiatry: Judgement and insight appear normal. Mood & affect appropriate.   Possible sepsis Does not meet criteria, but concern. In setting of infected stone s/p percutaneous drain. Given morphine IV at bedside. -Transfer to stepdown -Broaden to Vanc/Cefepime -Stat labs including blood cultures; urine culture, CBC, type and screen, lactic acid -NS IV fluid bolus -Stat chest/abd x-rays -Consult PCCM for acute condition  CRITICAL CARE Performed by: Cordelia Poche, MD   Total critical care time: 45 minutes  Critical care time was exclusive of separately billable procedures and treating other patients.  Critical care was necessary to treat or prevent imminent or life-threatening deterioration.  Critical care was time spent personally by  me on the following activities: development of treatment plan with patient and/or surrogate as well as nursing, discussions with consultants, evaluation of patient's response to treatment, examination of patient, obtaining history from patient or surrogate, ordering and performing treatments and interventions, ordering and review of laboratory studies, ordering and review of radiographic studies, pulse oximetry and re-evaluation of patient's condition.  Cordelia Poche, MD [facility] 08/06/2019, 1:44 PM

## 2019-08-06 NOTE — Progress Notes (Addendum)
Patient returned from procedure Left Nephrostomy tube and dressing  clean dry intact. No change from am assessment. Pt is drowsy, easy to arouse. Will continue to monitor.

## 2019-08-06 NOTE — Progress Notes (Addendum)
Patient complained of SOB. Upon assessment oxygen saturation 86% on room air, RR 28, HR 124. Oxygen applied at two liters without resolve oxygen increased to four liters, saturation improved at 94%. Over all pts color became pale and RR sustained. Pt A&Ox4.Hospitalist provider contacted, CN and rapid response. Provider arrived to assess as well as RRT. Pt reported feeling pain in Left flank area. Morphine 2 mg IV administered.

## 2019-08-06 NOTE — Progress Notes (Signed)
Patient ID: [name], male   DOB: 1946-03-22, 74 y.o.   MRN: [id number]    Subjective: Pt well known to me admitted on Sunday night after a couple of episodes of shaking chills, weakness, and fever.  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  Imaging at that time did reveal a fluid collection medial to ureter that was concerning but improved on subsequent imaging in the winter and no symptoms or signs of infection or complete ureteral obstruction at that time.  Upon admission Sunday night, repeat CT imaging revealed that his stone has migrated into the proximal left ureter now likely resulting in complete ureteral obstruction considering increased hydronephrosis.  He is s/p left nephrostomy placement earlier today due to concerns of difficult retrograde ureteral access.  This was placed successfully by Dr. Laurence Ferrari.  He subsequently developed chills, rigors, tachypnea with increased oxygen requirement and hypertensive crisis.  Now feeling better, transferred to ICU and hemodynamically stable.  Objective: Vital signs in last 24 hours: Temp:  [97.9 F (36.6 C)-99.5 F (37.5 C)] 99.5 F (37.5 C) (04/06 1350) Pulse Rate:  [64-126] 108 (04/06 1350) Resp:  [18-33] 29 (04/06 1350) BP: (124-225)/(56-193) 158/56 (04/06 1350) SpO2:  [93 %-100 %] 100 % (04/06 1350)  Intake/Output from previous day: 04/05 0701 - 04/06 0700 In: [id number] [P.O.:240; I.V.:1172.4; IV Piggyback:9.5] Out: 750 [Urine:750] Intake/Output this shift: Total I/O In: -  Out: 500 [Urine:500]  Physical Exam:  General: Alert and oriented CV: Regular, tachycardic Abd: Left nephrostomy tube with clear urine drainage with minimal pink tinge, no purulence, left abdominal tenderness  Lab Results: Recent  Labs    08/04/19 1702 08/05/19 0515 08/06/19 0454  HGB 14.2 12.9* 12.9*  HCT 43.2 40.0 39.7   CBC Latest Ref Rng & Units 08/06/2019 08/05/2019 08/04/2019  WBC 4.0 - 10.5 K/uL 8.7 12.5(H) 17.2(H)  Hemoglobin 13.0 - 17.0 g/dL 12.9(L) 12.9(L) 14.2  Hematocrit 39.0 - 52.0 % 39.7 40.0 43.2  Platelets 150 - 400 K/uL 225 212 271     BMET Recent Labs    08/05/19 0515 08/06/19 0454  NA 140 140  K 3.5 3.7  CL 108 110  CO2 26 21*  GLUCOSE 148* 110*  BUN 20 17  CREATININE 1.50* 1.21  CALCIUM 8.3* 8.1*     Studies/Results: DG CHEST PORT 1 VIEW  Result Date: 08/06/2019 CLINICAL DATA:  Dyspnea today. EXAM: PORTABLE CHEST 1 VIEW COMPARISON:  Single-view of the chest 08/04/2019. FINDINGS: Lungs clear. Heart size is normal. No pneumothorax or pleural effusion. No acute or focal bony abnormality. IMPRESSION: No acute disease. Electronically Signed   By: Inge Rise M.D.   On: 08/06/2019 13:56   DG Chest Portable 1 View  Result Date: 08/04/2019 CLINICAL DATA:  Fever EXAM: PORTABLE CHEST 1 VIEW COMPARISON:  06/09/2005 FINDINGS: Cardiomegaly. No confluent opacities, effusions or edema. No acute bony abnormality. IMPRESSION: Cardiomegaly.  No active disease. Electronically Signed   By: Rolm Baptise M.D.   On: 08/04/2019 17:43   DG Abd Portable 1V  Result Date: 08/06/2019 CLINICAL DATA:  Tachypnea nephrostomy placement EXAM: PORTABLE ABDOMEN - 1 VIEW COMPARISON:  August 04, 2019 FINDINGS: Nephrostomy placed on the left. Moderate stool in colon. No bowel dilatation or air-fluid level to suggest bowel obstruction. No evident free air. IMPRESSION: Nephrostomy  catheter on left. No bowel obstruction or free air evident on supine examination. Electronically Signed   By: Lowella Grip III M.D.   On: 08/06/2019 13:54   DG Abd Portable 1 View  Result Date: 08/04/2019 CLINICAL DATA:  Fever, renal stone EXAM: PORTABLE ABDOMEN - 1 VIEW COMPARISON:  CT 02/05/2019 FINDINGS: Study limited by body habitus.  Calcification projects over the left abdomen, likely left renal stone or possibly the previously seen left renal pelvic stone. No definite calcifications over the course of the ureters although small stones may be difficult to visualize. Nonobstructive bowel gas pattern. No organomegaly. IMPRESSION: Limited study due to body habitus. Calcification in the left abdomen could reflect the previously seen left renal or renal pelvic stone. Electronically Signed   By: Rolm Baptise M.D.   On: 08/04/2019 17:45   CT Renal Stone Study  Result Date: 08/04/2019 CLINICAL DATA:  Gross hematuria, history of kidney stones EXAM: CT ABDOMEN AND PELVIS WITHOUT CONTRAST TECHNIQUE: Multidetector CT imaging of the abdomen and pelvis was performed following the standard protocol without IV contrast. COMPARISON:  02/05/2019 FINDINGS: Lower chest: No acute abnormality. Hepatobiliary: Hepatic steatosis. Cholelithiasis. No biliary dilatation. Pancreas: Unremarkable. Spleen: Unremarkable. Adrenals/Urinary Tract: Adrenals are unremarkable. Renal cortical thinning. Stable small right upper pole renal cyst. Bilateral nonobstructing renal calculi are again identified measuring up to 3 mm. There is a 5 mm obstructing calculus within the proximal left ureter with resulting hydroureteronephrosis. Poorly distended bladder is unremarkable. Stomach/Bowel: Stomach is within normal limits. Bowel is normal in caliber. Small bowel loops are closely apposed to the ventral abdominal wall. Mild diverticulosis. Vascular/Lymphatic: Aortic atherosclerosis. No enlarged abdominal or pelvic lymph nodes. No adenopathy. Reproductive: Marked prostate enlargement. Other: No ascites. Small fat containing inguinal hernias. Tiny fat containing left parasagittal ventral hernia no longer contains a bowel loop. Musculoskeletal: No acute osseous finding. IMPRESSION: 5 mm obstructing calculus of the proximal left ureter with hydroureteronephrosis. Additional nonobstructing  renal calculi. Additional stable findings as detailed above. Electronically Signed   By: Macy Mis M.D.   On: 08/04/2019 19:21   IR NEPHROSTOMY PLACEMENT LEFT  Result Date: 08/06/2019 INDICATION: 74 year old male with an obstructing left ureteral calculus and resultant hydronephrosis. He presents for attempted nephrostomy tube placement. EXAM: IR NEPHROSTOMY PLACEMENT LEFT COMPARISON:  None. MEDICATIONS: 2 g Ancef; The antibiotic was administered in an appropriate time frame prior to skin puncture. ANESTHESIA/SEDATION: Fentanyl 100 mcg IV; Versed 2 mg IV Moderate Sedation Time:  10 minutes The patient was continuously monitored during the procedure by the interventional radiology nurse under my direct supervision. CONTRAST:  8 mL Omnipaque 300-administered into the collecting system(s) FLUOROSCOPY TIME:  Fluoroscopy Time: 0 minutes 42 seconds (46 mGy). COMPLICATIONS: None immediate. TECHNIQUE: The procedure, risks, benefits, and alternatives were explained to the patient. Questions regarding the procedure were encouraged and answered. The patient understands and consents to the procedure. The left flank was prepped with chlorhexidine in a sterile fashion, and a sterile drape was applied covering the operative field. A sterile gown and sterile gloves were used for the procedure. Local anesthesia was provided with 1% Lidocaine. The left flank was interrogated with ultrasound and the left kidney identified. The kidney is hydronephrotic. A suitable access site on the skin overlying the lower pole, posterior calix was identified. After local mg anesthesia was achieved, a small skin nick was made with an 11 blade scalpel. A 21 gauge Accustick needle was then advanced under direct sonographic guidance into the lower pole of the left kidney.  A 0.018 inch wire was advanced under fluoroscopic guidance into the left renal collecting system. The Accustick sheath was then advanced over the wire and a 0.018 system  exchanged for a 0.035 system. Gentle hand injection of contrast material confirms placement of the sheath within the renal collecting system. There is moderate hydronephrosis. The tract from the scan into the renal collecting system was then dilated serially to 10-French. A 10-French Cook all-purpose drain was then placed and positioned under fluoroscopic guidance. The locking loop is well formed within the left renal pelvis. The catheter was secured to the skin with 2-0 Prolene and a sterile bandage was placed. Catheter was left to gravity bag drainage. IMPRESSION: Successful placement of a left 10 French percutaneous nephrostomy tube. Electronically Signed   By: Jacqulynn Cadet M.D.   On: 08/06/2019 13:56    Assessment/Plan: 1) Left ureteral calculus: S/P left nephrostomy drainage today to relieve obstruction.  Cause of rigors/chills etc possibly related to infection of kidney although urine not cloudy or purulent.  Would recommend culture from left nephrostomy if not already done and agree with broad spectrum antibiotics in meantime.  No evidence of pneumothorax on CXR.   Will eventually need outpatient treatment of stone following resolution of acute medical issues and will likely consider attempt at retrograde ureteroscopic laser lithotripsy with plans to proceed with antegrade treatment if unsuccessful.  Will continue to follow during his hospitalization.   LOS: 2 days   [name] 08/06/2019, 2:47 PM

## 2019-08-07 ENCOUNTER — Inpatient Hospital Stay (HOSPITAL_COMMUNITY): Payer: Medicare Other

## 2019-08-07 ENCOUNTER — Other Ambulatory Visit (HOSPITAL_COMMUNITY): Payer: Medicare Other

## 2019-08-07 DIAGNOSIS — I361 Nonrheumatic tricuspid (valve) insufficiency: Secondary | ICD-10-CM

## 2019-08-07 DIAGNOSIS — R778 Other specified abnormalities of plasma proteins: Secondary | ICD-10-CM

## 2019-08-07 DIAGNOSIS — N10 Acute tubulo-interstitial nephritis: Secondary | ICD-10-CM | POA: Diagnosis not present

## 2019-08-07 DIAGNOSIS — N200 Calculus of kidney: Secondary | ICD-10-CM | POA: Diagnosis not present

## 2019-08-07 DIAGNOSIS — N179 Acute kidney failure, unspecified: Secondary | ICD-10-CM | POA: Diagnosis not present

## 2019-08-07 DIAGNOSIS — N201 Calculus of ureter: Secondary | ICD-10-CM

## 2019-08-07 DIAGNOSIS — I161 Hypertensive emergency: Secondary | ICD-10-CM

## 2019-08-07 DIAGNOSIS — R0600 Dyspnea, unspecified: Secondary | ICD-10-CM

## 2019-08-07 LAB — COMPREHENSIVE METABOLIC PANEL
ALT: 15 U/L (ref 0–44)
AST: 20 U/L (ref 15–41)
Albumin: 2.4 g/dL — ABNORMAL LOW (ref 3.5–5.0)
Alkaline Phosphatase: 77 U/L (ref 38–126)
Anion gap: 7 (ref 5–15)
BUN: 17 mg/dL (ref 8–23)
CO2: 20 mmol/L — ABNORMAL LOW (ref 22–32)
Calcium: 8.2 mg/dL — ABNORMAL LOW (ref 8.9–10.3)
Chloride: 111 mmol/L (ref 98–111)
Creatinine, Ser: 1.35 mg/dL — ABNORMAL HIGH (ref 0.61–1.24)
GFR calc Af Amer: 59 mL/min — ABNORMAL LOW (ref >60)
GFR calc non Af Amer: 51 mL/min — ABNORMAL LOW (ref >60)
Glucose, Bld: 103 mg/dL — ABNORMAL HIGH (ref 70–99)
Potassium: 4 mmol/L (ref 3.5–5.1)
Sodium: 138 mmol/L (ref 135–145)
Total Bilirubin: 0.9 mg/dL (ref 0.3–1.2)
Total Protein: 5.4 g/dL — ABNORMAL LOW (ref 6.5–8.1)

## 2019-08-07 LAB — CBC
HCT: 39.3 % (ref 39.0–52.0)
Hemoglobin: 12.6 g/dL — ABNORMAL LOW (ref 13.0–17.0)
MCH: 28.4 pg (ref 26.0–34.0)
MCHC: 32.1 g/dL (ref 30.0–36.0)
MCV: 88.7 fL (ref 80.0–100.0)
Platelets: 216 10*3/uL (ref 150–400)
RBC: 4.43 MIL/uL (ref 4.22–5.81)
RDW: 14.4 % (ref 11.5–15.5)
WBC: 14.5 10*3/uL — ABNORMAL HIGH (ref 4.0–10.5)
nRBC: 0 % (ref 0.0–0.2)

## 2019-08-07 LAB — ABO/RH: ABO/RH(D): A NEG

## 2019-08-07 LAB — URINE CULTURE: Culture: NO GROWTH

## 2019-08-07 LAB — TROPONIN I (HIGH SENSITIVITY)
Troponin I (High Sensitivity): 1234 ng/L (ref <18)
Troponin I (High Sensitivity): 1141 ng/L (ref <18)

## 2019-08-07 LAB — PROCALCITONIN: Procalcitonin: 71.65 ng/mL

## 2019-08-07 MED ORDER — TRAMADOL HCL 50 MG PO TABS
50.0000 mg | ORAL_TABLET | Freq: Four times a day (QID) | ORAL | Status: DC | PRN
  Administered 2019-08-10 (×2): 50 mg via ORAL
  Filled 2019-08-07 (×2): qty 1

## 2019-08-07 NOTE — CV Procedure (Signed)
Echo attempted 2x and patient was not in the bed. Will try again later this afternoon.

## 2019-08-07 NOTE — Progress Notes (Signed)
[facility]                                                                              Patient Demographics  [name], is a 74 y.o. male, DOB - Sep 09, 1945, [id number]  Admit date - 08/04/2019   Admitting Physician Elwyn Reach, MD  Outpatient Primary MD for the patient is [name], MD  Outpatient specialists:   LOS - 3  days   Medical records reviewed and are as summarized below:    Chief Complaint  Patient presents with  . Fever  . Urinary Tract Infection       Brief summary   Patient is a 74 year old male with history of renal stones, migraine headaches, morbid obesity, presented to ED with left flank pain, weakness and fever.  Patient had been dealing with left-sided kidney stone for which urology had been trying to dislodge, however too large to pass on its own and was unable to get stented due to enlarged prostate.  Unable to get nephrostomy tube due to body habitus.  Patient presented with left flank pain, urology was consulted.  CT showed 5 mm obstructing calculus of the proximal left ureter with hydronephrosis, additional nonobstructing renal calculi.  UA positive for UTI  Assessment & Plan    Principal Problem: Severe sepsis with infected obstructing renal calculus, left, hydronephrosis, UTI -Urology and interventional radiology was consulted, patient underwent left-sided nephrostomy tube placement -Post procedure, patient was noted to have significant tachypnea, rigors, hypertensive emergency, BP 225/193, tachycardia 126, elevated procalcitonin 71.6, lactic acidosis, elevated troponin, leukocytosis.  Patient was noted to be hypoxemic, and mottled, received IV fluids, morphine, subsequently improved. -Patient was transferred to stepdown unit, placed on broad-spectrum IV antibiotics -Follow urine culture and sensitivities, blood cultures, currently negative    Active Problems:  Acute hypoxemic respiratory failure -Likely  decompensated in the setting of sepsis, #1 -Chest x-ray showed no acute infiltrates or pneumothorax -Weaned off O2, on room air, O2 sats 92%  Elevated troponin in the setting of severe sepsis -Troponin positive 589 on 4/6--> 1906--> 1707 -Currently no chest pain tachycardia no acute ST-T wave changes -Follow 2D echo, cardiology consulted     AKI (acute kidney injury) ([place]) Baseline creatinine unknown, creatinine 1.5 on admission, in the setting of infected obstructing left renal stone and sepsis -Creatinine improving 1.3    Hypokalemia Resolved  Morbid obesity Estimated body mass index is 40.36 kg/m as calculated from the following:   Height as of this encounter: 5\' 8"  (1.727 m).   Weight as of this encounter: 120.4 kg.  Code Status: Full code DVT Prophylaxis: Heparin subcu Family Communication: Discussed all imaging results, lab results, explained to the patient    Disposition Plan: Patient from home, anticipated discharge home in next 24 to 48 hours, pending urine and blood cultures sensitivities, cardiology evaluation, PT OT evaluation  PT evaluation ordered  Time Spent in minutes 35 minutes  Procedures:  08/06/2019 placement of a LEFT 99F percutaneous nephrostomy tube. *  Consultants:   Urology, interventional radiology  Antimicrobials:   Anti-infectives (From admission, onward)   Start     Dose/Rate Route  Frequency Ordered Stop   08/07/19 1500  vancomycin (VANCOREADY) IVPB 1250 mg/250 mL  Status:  Discontinued     1,250 mg 166.7 mL/hr over 90 Minutes Intravenous Every 12 hours 08/06/19 1407 08/06/19 1413   08/07/19 1500  vancomycin (VANCOREADY) IVPB 1250 mg/250 mL     1,250 mg 166.7 mL/hr over 90 Minutes Intravenous Every 24 hours 08/06/19 1413     08/06/19 2200  ceFEPIme (MAXIPIME) 2 g in sodium chloride 0.9 % 100 mL IVPB     2 g 200 mL/hr over 30 Minutes Intravenous Every 8 hours 08/06/19 1407     08/06/19 1500  vancomycin (VANCOCIN) 2,500 mg in sodium  chloride 0.9 % 500 mL IVPB     2,500 mg 250 mL/hr over 120 Minutes Intravenous  Once 08/06/19 1407 08/06/19 1850   08/06/19 1415  ceFEPIme (MAXIPIME) 2 g in sodium chloride 0.9 % 100 mL IVPB     2 g 200 mL/hr over 30 Minutes Intravenous  Once 08/06/19 1407 08/06/19 1554   08/06/19 1140  ceFAZolin (ANCEF) 2-4 GM/100ML-% IVPB    Note to Pharmacy: Lesia Hausen   : cabinet override      08/06/19 1140 08/06/19 1152   08/05/19 1800  cefTRIAXone (ROCEPHIN) 1 g in sodium chloride 0.9 % 100 mL IVPB  Status:  Discontinued     1 g 200 mL/hr over 30 Minutes Intravenous Every 24 hours 08/04/19 2236 08/06/19 1408   08/04/19 1830  cefTRIAXone (ROCEPHIN) 2 g in sodium chloride 0.9 % 100 mL IVPB     2 g 200 mL/hr over 30 Minutes Intravenous  Once 08/04/19 1807 08/04/19 1930          Medications  Scheduled Meds: . Chlorhexidine Gluconate Cloth  6 each Topical Daily  . heparin injection (subcutaneous)  5,000 Units Subcutaneous Q8H  . mouth rinse  15 mL Mouth Rinse BID  . sodium chloride flush  5 mL Intracatheter Q8H   Continuous Infusions: . ceFEPime (MAXIPIME) IV Stopped (08/07/19 0721)  . vancomycin     PRN Meds:.acetaminophen **OR** acetaminophen, morphine injection, ondansetron **OR** ondansetron (ZOFRAN) IV      Subjective:   [name] was seen and examined today.  Feeling a lot better today, states left flank pain 6/10 today, no chest pain.  States chronically has some shortness of breath with exertion.  Patient denies dizziness, N/V/D/C, new weakness, numbess, tingling. No acute events overnight.    Objective:   Vitals:   08/07/19 0327 08/07/19 0400 08/07/19 0800 08/07/19 0900  BP:  110/61 118/81   Pulse:  64 67   Resp:  17 19   Temp: (!) 97.4 F (36.3 C)  (!) 96.3 F (35.7 C) 98.1 F (36.7 C)  TempSrc: Oral  Axillary Oral  SpO2:  93% 93%   Weight:      Height:        Intake/Output Summary (Last 24 hours) at 08/07/2019 1223 Last data filed at 08/07/2019  1140 Gross per 24 hour  Intake 4959.52 ml  Output 1250 ml  Net 3709.52 ml     Wt Readings from Last 3 Encounters:  08/04/19 120.4 kg  02/04/19 128.9 kg  01/31/19 128 kg     Exam  General: Alert and oriented x 3, NAD  Cardiovascular: S1 S2 auscultated, no murmurs, RRR  Respiratory: Clear to auscultation bilaterally, no wheezing, rales or rhonchi  Gastrointestinal: Obese, soft, left CVAT, nondistended, + bowel sounds  Ext: no pedal edema bilaterally  Neuro: No new deficits  Musculoskeletal: No digital cyanosis, clubbing  Skin: No rashes  Psych: Normal affect and demeanor, alert and oriented x3    Data Reviewed:  I have personally reviewed following labs and imaging studies  Micro Results Recent Results (from the past 240 hour(s))  Culture, blood (routine x 2)     Status: None (Preliminary result)   Collection Time: 08/04/19  5:10 PM   Specimen: BLOOD LEFT FOREARM  Result Value Ref Range Status   Specimen Description   Final    BLOOD LEFT FOREARM Performed at [facility] [address], [place], [place] [address]    Special Requests   Final    BOTTLES DRAWN AEROBIC AND ANAEROBIC Blood Culture results may not be optimal due to an excessive volume of blood received in culture bottles Performed at [facility] [address], [place], [place] [address]    Culture   Final    NO GROWTH 3 DAYS Performed at [facility], [place] [address], [place], [place] [address]    Report Status PENDING  Incomplete  Culture, blood (routine x 2)     Status: None (Preliminary result)   Collection Time: 08/04/19  5:15 PM   Specimen: BLOOD  Result Value Ref Range Status   Specimen Description   Final    BLOOD BLOOD RIGHT FOREARM Performed at [facility] [address], [place], [place] [address]    Special Requests   Final    BOTTLES DRAWN AEROBIC AND ANAEROBIC Blood Culture adequate volume Performed at [facility] [address] [place], [place] [address]    Culture   Final    NO GROWTH 3 DAYS Performed at [facility], [place] [address] [place], [place] [address]    Report Status PENDING  Incomplete  Respiratory Panel by RT PCR (Flu A&B, Covid) - Nasopharyngeal Swab     Status: None   Collection Time: 08/04/19  7:21 PM   Specimen: Nasopharyngeal Swab  Result Value Ref Range Status   SARS Coronavirus 2 by RT PCR NEGATIVE NEGATIVE Final    Comment: (NOTE) SARS-CoV-2 target nucleic acids are NOT DETECTED. The SARS-CoV-2 RNA is generally detectable in upper respiratoy specimens during the acute phase of infection. The lowest concentration of SARS-CoV-2 viral copies this assay can detect is 131 copies/mL. A negative result does not preclude SARS-Cov-2 infection and should not be used as the sole basis for treatment or other patient management decisions. A negative result may occur with  improper specimen collection/handling, submission of specimen other than nasopharyngeal swab, presence of viral mutation(s) within the areas targeted by this assay, and inadequate number of viral copies (<131 copies/mL). A negative result must be combined with clinical observations, patient history, and epidemiological information. The expected result is Negative. Fact Sheet for Patients:  PinkCheek.be Fact Sheet for Healthcare Providers:  GravelBags.it This test is not yet ap proved or cleared by the [place] FDA and  has been authorized for detection and/or diagnosis of SARS-CoV-2 by FDA under an Emergency Use Authorization (EUA). This EUA will remain  in effect (meaning this test can be used) for the duration of the COVID-19 declaration under Section 564(b)(1) of the Act, 21 U.S.C. section 360bbb-3(b)(1), unless the authorization is terminated or revoked sooner.    Influenza A by PCR NEGATIVE NEGATIVE Final    Influenza B by PCR NEGATIVE NEGATIVE Final    Comment: (NOTE) The Xpert Xpress SARS-CoV-2/FLU/RSV assay is intended as  an aid in  the diagnosis of influenza from Nasopharyngeal swab specimens and  should not be used as a sole basis for treatment. Nasal washings and  aspirates are unacceptable for Xpert Xpress SARS-CoV-2/FLU/RSV  testing. Fact Sheet for Patients: PinkCheek.be Fact Sheet for Healthcare Providers: GravelBags.it This test is not yet approved or cleared by the [place] FDA and  has been authorized for detection and/or diagnosis of SARS-CoV-2 by  FDA under an Emergency Use Authorization (EUA). This EUA will remain  in effect (meaning this test can be used) for the duration of the  Covid-19 declaration under Section 564(b)(1) of the Act, 21  U.S.C. section 360bbb-3(b)(1), unless the authorization is  terminated or revoked. Performed at [facility], [place] [address], [place], [place] [address]   Culture, blood (routine x 2)     Status: None (Preliminary result)   Collection Time: 08/06/19  2:29 PM   Specimen: BLOOD  Result Value Ref Range Status   Specimen Description   Final    BLOOD RIGHT ANTECUBITAL Performed at [facility] [address] [place], [place] [address]    Special Requests   Final    BOTTLES DRAWN AEROBIC AND ANAEROBIC Blood Culture adequate volume Performed at [facility] [address], [place], [place] [address]    Culture   Final    NO GROWTH < 12 HOURS Performed at [facility] [address] [place], [place] [address]    Report Status PENDING  Incomplete  Culture, blood (routine x 2)     Status: None (Preliminary result)   Collection Time: 08/06/19  2:31 PM   Specimen: BLOOD  Result Value Ref Range Status   Specimen Description   Final    BLOOD RIGHT ANTECUBITAL Performed at [facility]  [address], [place], [place] [address]    Special Requests   Final    BOTTLES DRAWN AEROBIC AND ANAEROBIC Blood Culture adequate volume Performed at [facility] [address], [place], [place] [address]    Culture   Final    NO GROWTH < 12 HOURS Performed at [facility] [address], [place], [place] [address]    Report Status PENDING  Incomplete  MRSA PCR Screening     Status: None   Collection Time: 08/06/19  6:38 PM   Specimen: Nasal Mucosa; Nasopharyngeal  Result Value Ref Range Status   MRSA by PCR NEGATIVE NEGATIVE Final    Comment:        The GeneXpert MRSA Assay (FDA approved for NASAL specimens only), is one component of a comprehensive MRSA colonization surveillance program. It is not intended to diagnose MRSA infection nor to guide or monitor treatment for MRSA infections. Performed at [facility], [place] [address], [place], [place] [address]     Radiology Reports DG CHEST PORT 1 VIEW  Result Date: 08/06/2019 CLINICAL DATA:  Dyspnea today. EXAM: PORTABLE CHEST 1 VIEW COMPARISON:  Single-view of the chest 08/04/2019. FINDINGS: Lungs clear. Heart size is normal. No pneumothorax or pleural effusion. No acute or focal bony abnormality. IMPRESSION: No acute disease. Electronically Signed   By: Inge Rise M.D.   On: 08/06/2019 13:56   DG Chest Portable 1 View  Result Date: 08/04/2019 CLINICAL DATA:  Fever EXAM: PORTABLE CHEST 1 VIEW COMPARISON:  06/09/2005 FINDINGS: Cardiomegaly. No confluent opacities, effusions or edema. No acute bony abnormality. IMPRESSION: Cardiomegaly.  No active disease. Electronically Signed  By: Rolm Baptise M.D.   On: 08/04/2019 17:43   DG Abd Portable 1V  Result Date: 08/06/2019 CLINICAL DATA:  Tachypnea nephrostomy placement EXAM: PORTABLE ABDOMEN - 1 VIEW COMPARISON:  August 04, 2019 FINDINGS: Nephrostomy placed on the left. Moderate stool in colon. No bowel dilatation or air-fluid level to  suggest bowel obstruction. No evident free air. IMPRESSION: Nephrostomy catheter on left. No bowel obstruction or free air evident on supine examination. Electronically Signed   By: Lowella Grip III M.D.   On: 08/06/2019 13:54   DG Abd Portable 1 View  Result Date: 08/04/2019 CLINICAL DATA:  Fever, renal stone EXAM: PORTABLE ABDOMEN - 1 VIEW COMPARISON:  CT 02/05/2019 FINDINGS: Study limited by body habitus. Calcification projects over the left abdomen, likely left renal stone or possibly the previously seen left renal pelvic stone. No definite calcifications over the course of the ureters although small stones may be difficult to visualize. Nonobstructive bowel gas pattern. No organomegaly. IMPRESSION: Limited study due to body habitus. Calcification in the left abdomen could reflect the previously seen left renal or renal pelvic stone. Electronically Signed   By: Rolm Baptise M.D.   On: 08/04/2019 17:45   CT Renal Stone Study  Result Date: 08/04/2019 CLINICAL DATA:  Gross hematuria, history of kidney stones EXAM: CT ABDOMEN AND PELVIS WITHOUT CONTRAST TECHNIQUE: Multidetector CT imaging of the abdomen and pelvis was performed following the standard protocol without IV contrast. COMPARISON:  02/05/2019 FINDINGS: Lower chest: No acute abnormality. Hepatobiliary: Hepatic steatosis. Cholelithiasis. No biliary dilatation. Pancreas: Unremarkable. Spleen: Unremarkable. Adrenals/Urinary Tract: Adrenals are unremarkable. Renal cortical thinning. Stable small right upper pole renal cyst. Bilateral nonobstructing renal calculi are again identified measuring up to 3 mm. There is a 5 mm obstructing calculus within the proximal left ureter with resulting hydroureteronephrosis. Poorly distended bladder is unremarkable. Stomach/Bowel: Stomach is within normal limits. Bowel is normal in caliber. Small bowel loops are closely apposed to the ventral abdominal wall. Mild diverticulosis. Vascular/Lymphatic: Aortic  atherosclerosis. No enlarged abdominal or pelvic lymph nodes. No adenopathy. Reproductive: Marked prostate enlargement. Other: No ascites. Small fat containing inguinal hernias. Tiny fat containing left parasagittal ventral hernia no longer contains a bowel loop. Musculoskeletal: No acute osseous finding. IMPRESSION: 5 mm obstructing calculus of the proximal left ureter with hydroureteronephrosis. Additional nonobstructing renal calculi. Additional stable findings as detailed above. Electronically Signed   By: Macy Mis M.D.   On: 08/04/2019 19:21   IR NEPHROSTOMY PLACEMENT LEFT  Result Date: 08/06/2019 INDICATION: 74 year old male with an obstructing left ureteral calculus and resultant hydronephrosis. He presents for attempted nephrostomy tube placement. EXAM: IR NEPHROSTOMY PLACEMENT LEFT COMPARISON:  None. MEDICATIONS: 2 g Ancef; The antibiotic was administered in an appropriate time frame prior to skin puncture. ANESTHESIA/SEDATION: Fentanyl 100 mcg IV; Versed 2 mg IV Moderate Sedation Time:  10 minutes The patient was continuously monitored during the procedure by the interventional radiology nurse under my direct supervision. CONTRAST:  8 mL Omnipaque 300-administered into the collecting system(s) FLUOROSCOPY TIME:  Fluoroscopy Time: 0 minutes 42 seconds (46 mGy). COMPLICATIONS: None immediate. TECHNIQUE: The procedure, risks, benefits, and alternatives were explained to the patient. Questions regarding the procedure were encouraged and answered. The patient understands and consents to the procedure. The left flank was prepped with chlorhexidine in a sterile fashion, and a sterile drape was applied covering the operative field. A sterile gown and sterile gloves were used for the procedure. Local anesthesia was provided with 1% Lidocaine. The left flank was interrogated  with ultrasound and the left kidney identified. The kidney is hydronephrotic. A suitable access site on the skin overlying the lower  pole, posterior calix was identified. After local mg anesthesia was achieved, a small skin nick was made with an 11 blade scalpel. A 21 gauge Accustick needle was then advanced under direct sonographic guidance into the lower pole of the left kidney. A 0.018 inch wire was advanced under fluoroscopic guidance into the left renal collecting system. The Accustick sheath was then advanced over the wire and a 0.018 system exchanged for a 0.035 system. Gentle hand injection of contrast material confirms placement of the sheath within the renal collecting system. There is moderate hydronephrosis. The tract from the scan into the renal collecting system was then dilated serially to 10-French. A 10-French [facility] all-purpose drain was then placed and positioned under fluoroscopic guidance. The locking loop is well formed within the left renal pelvis. The catheter was secured to the skin with 2-0 Prolene and a sterile bandage was placed. Catheter was left to gravity bag drainage. IMPRESSION: Successful placement of a left 10 French percutaneous nephrostomy tube. Electronically Signed   By: Jacqulynn Cadet M.D.   On: 08/06/2019 13:56    Lab Data:  CBC: Recent Labs  Lab 08/04/19 1702 08/05/19 0515 08/06/19 0454 08/06/19 1412 08/07/19 0248  WBC 17.2* 12.5* 8.7 2.3* 14.5*  NEUTROABS 14.9*  --  6.0 1.8  --   HGB 14.2 12.9* 12.9* 14.3 12.6*  HCT 43.2 40.0 39.7 44.9 39.3  MCV 86.6 87.3 87.8 88.9 88.7  PLT 271 212 225 221 [id number]   Basic Metabolic Panel: Recent Labs  Lab 08/04/19 1702 08/05/19 0515 08/06/19 0454 08/07/19 0248  NA 141 140 140 138  K 3.4* 3.5 3.7 4.0  CL 109 108 110 111  CO2 20* 26 21* 20*  GLUCOSE 198* 148* 110* 103*  BUN 22 20 17 17   CREATININE 1.50* 1.50* 1.21 1.35*  CALCIUM 8.6* 8.3* 8.1* 8.2*   GFR: Estimated Creatinine Clearance: 59.7 mL/min (A) (by C-G formula based on SCr of 1.35 mg/dL (H)). Liver Function Tests: Recent Labs  Lab 08/04/19 1702 08/05/19 0515 08/07/19 0248   AST 16 13* 20  ALT 16 14 15   ALKPHOS 106 87 77  BILITOT 1.1 1.0 0.9  PROT 6.8 5.8* 5.4*  ALBUMIN 3.3* 2.7* 2.4*   No results for input(s): LIPASE, AMYLASE in the last 168 hours. No results for input(s): AMMONIA in the last 168 hours. Coagulation Profile: Recent Labs  Lab 08/05/19 0856  INR 1.1   Cardiac Enzymes: No results for input(s): CKTOTAL, CKMB, CKMBINDEX, TROPONINI in the last 168 hours. BNP (last 3 results) No results for input(s): PROBNP in the last 8760 hours. HbA1C: No results for input(s): HGBA1C in the last 72 hours. CBG: No results for input(s): GLUCAP in the last 168 hours. Lipid Profile: No results for input(s): CHOL, HDL, LDLCALC, TRIG, CHOLHDL, LDLDIRECT in the last 72 hours. Thyroid Function Tests: No results for input(s): TSH, T4TOTAL, FREET4, T3FREE, THYROIDAB in the last 72 hours. Anemia Panel: No results for input(s): VITAMINB12, FOLATE, FERRITIN, TIBC, IRON, RETICCTPCT in the last 72 hours. Urine analysis:    Component Value Date/Time   COLORURINE AMBER (A) 08/04/2019 1702   APPEARANCEUR CLOUDY (A) 08/04/2019 1702   LABSPEC 1.017 08/04/2019 1702   PHURINE 6.0 08/04/2019 1702   GLUCOSEU NEGATIVE 08/04/2019 1702   HGBUR SMALL (A) 08/04/2019 1702   BILIRUBINUR NEGATIVE 08/04/2019 1702   KETONESUR NEGATIVE 08/04/2019 1702   PROTEINUR  30 (A) 08/04/2019 1702   NITRITE NEGATIVE 08/04/2019 1702   LEUKOCYTESUR LARGE (A) 08/04/2019 1702     Maricruz Lucero M.D. [facility] 08/07/2019, 12:23 PM   Call night coverage person covering after 7pm

## 2019-08-07 NOTE — Progress Notes (Signed)
  Echocardiogram 2D Echocardiogram has been performed.  [name] 08/07/2019, 3:43 PM

## 2019-08-07 NOTE — Progress Notes (Signed)
Patient ID: [name], male   DOB: [birth year], 74 y.o.   MRN: [id number]    Subjective: Pt noted to have rising troponin levels but without chest pain or ECG changes.  Cardiology evaluating and echocardiogram pending.  He states that he still has some left abdominal pain but improving.  Denies flank pain.  Objective: Vital signs in last 24 hours: Temp:  [96.3 F (35.7 C)-98.4 F (36.9 C)] 96.7 F (35.9 C) (04/07 1200) Pulse Rate:  [63-98] 70 (04/07 1500) Resp:  [17-31] 18 (04/07 1500) BP: (100-137)/(59-81) 122/74 (04/07 1200) SpO2:  [91 %-99 %] 96 % (04/07 1500)  Intake/Output from previous day: 04/06 0701 - 04/07 0700 In: 4959.5 [P.O.:120; I.V.:3074.7; IV Piggyback:1759.9] Out: 1450 [Urine:1450] Intake/Output this shift: Total I/O In: 245 [P.O.:240; I.V.:5] Out: 300 [Urine:300]  Physical Exam:  General: Alert and oriented Abdomen: Soft, ND, left PCN in place and draining clear urine   Lab Results: Recent Labs    08/06/19 0454 08/06/19 1412 08/07/19 0248  HGB 12.9* 14.3 12.6*  HCT 39.7 44.9 39.3   CBC Latest Ref Rng & Units 08/07/2019 08/06/2019 08/06/2019  WBC 4.0 - 10.5 K/uL 14.5(H) 2.3(L) 8.7  Hemoglobin 13.0 - 17.0 g/dL 12.6(L) 14.3 12.9(L)  Hematocrit 39.0 - 52.0 % 39.3 44.9 39.7  Platelets 150 - 400 K/uL 216 221 225     BMET Recent Labs    08/06/19 0454 08/07/19 0248  NA 140 138  K 3.7 4.0  CL 110 111  CO2 21* 20*  GLUCOSE 110* 103*  BUN 17 17  CREATININE 1.21 1.35*  CALCIUM 8.1* 8.2*     Studies/Results: Urine culture negative  Assessment/Plan: Left ureteral stone: Urine culture negative but may be related to the fact that he was already on antibiotics when checked.  No urine culture checked on admission but UA looked infected.  Would continue empiric antibiotic coverage right now considering likelihood of GU source.  Will defer definitive treatment of stone until medical issues resolved and stabilized.  With PCN in place, this is now a  non-urgent and elective problem.   LOS: 3 days   [name] 08/07/2019, 4:03 PM

## 2019-08-07 NOTE — Progress Notes (Signed)
Referring Physician(s): [name],S  Supervising Physician: [name]  Patient Status:  [name] - In-pt  Chief Complaint:  Left flank pain/nephrolithiasis/hydronephrosis  Subjective: Patient doing fairly well this AM.  No further rigors/chills or elevated temperature.  He does have some left flank soreness at PCN site.  Urine culture pending.  Blood cultures negative to date.  Past Medical History:  Diagnosis Date  . Chronic kidney disease    RENAL STONES  . High frequency hearing loss   . History of migraine headaches   . Hx of colonic polyps   . Obesity   . Seborrheic dermatitis   . Vitiligo    Past Surgical History:  Procedure Laterality Date  . COLON SURGERY  01/2001  . CYSTOSCOPY/URETEROSCOPY/HOLMIUM LASER/STENT PLACEMENT Left 02/04/2019   Procedure: CYSTOSCOPY Left retrograde and attempted left ureteroscopy;  Surgeon: Raynelle Bring, MD;  Location: [facility];  Service: Urology;  Laterality: Left;  ONLY NEEDS 60 MIN  . FRACTURE SURGERY    . IR NEPHROSTOMY PLACEMENT LEFT  02/05/2019  . IR NEPHROSTOMY PLACEMENT LEFT  08/06/2019  . TONSILLECTOMY AND ADENOIDECTOMY        Allergies: Codeine  Medications: Prior to Admission medications   Medication Sig Start Date End Date Taking? Authorizing Provider  cefdinir (OMNICEF) 300 MG capsule Take 1 capsule (300 mg total) by mouth 2 (two) times daily. Patient not taking: Reported on 08/04/2019 02/06/19   Raynelle Bring, MD  oxyCODONE-acetaminophen (PERCOCET) 5-325 MG tablet Take 1 tablet by mouth every 4 (four) hours as needed for severe pain. Patient not taking: Reported on 08/04/2019 02/06/19 02/06/20  Raynelle Bring, MD     Vital Signs: BP 118/81   Pulse 67   Temp 98.1 F (36.7 C) (Oral)   Resp 19   Ht 5\' 8"  (1.727 m)   Wt 265 lb 6.9 oz (120.4 kg)   SpO2 93%   BMI 40.36 kg/m   Physical Exam awake, alert.  Left PCN intact, output 775 cc of amber-colored urine, insertion site okay, slightly tender to  palpation  Imaging: DG CHEST PORT 1 VIEW  Result Date: 08/06/2019 CLINICAL DATA:  Dyspnea today. EXAM: PORTABLE CHEST 1 VIEW COMPARISON:  Single-view of the chest 08/04/2019. FINDINGS: Lungs clear. Heart size is normal. No pneumothorax or pleural effusion. No acute or focal bony abnormality. IMPRESSION: No acute disease. Electronically Signed   By: Inge Rise M.D.   On: 08/06/2019 13:56   DG Chest Portable 1 View  Result Date: 08/04/2019 CLINICAL DATA:  Fever EXAM: PORTABLE CHEST 1 VIEW COMPARISON:  06/09/2005 FINDINGS: Cardiomegaly. No confluent opacities, effusions or edema. No acute bony abnormality. IMPRESSION: Cardiomegaly.  No active disease. Electronically Signed   By: Rolm Baptise M.D.   On: 08/04/2019 17:43   DG Abd Portable 1V  Result Date: 08/06/2019 CLINICAL DATA:  Tachypnea nephrostomy placement EXAM: PORTABLE ABDOMEN - 1 VIEW COMPARISON:  August 04, 2019 FINDINGS: Nephrostomy placed on the left. Moderate stool in colon. No bowel dilatation or air-fluid level to suggest bowel obstruction. No evident free air. IMPRESSION: Nephrostomy catheter on left. No bowel obstruction or free air evident on supine examination. Electronically Signed   By: Lowella Grip III M.D.   On: 08/06/2019 13:54   DG Abd Portable 1 View  Result Date: 08/04/2019 CLINICAL DATA:  Fever, renal stone EXAM: PORTABLE ABDOMEN - 1 VIEW COMPARISON:  CT 02/05/2019 FINDINGS: Study limited by body habitus. Calcification projects over the left abdomen, likely left renal stone or possibly the previously seen left  renal pelvic stone. No definite calcifications over the course of the ureters although small stones may be difficult to visualize. Nonobstructive bowel gas pattern. No organomegaly. IMPRESSION: Limited study due to body habitus. Calcification in the left abdomen could reflect the previously seen left renal or renal pelvic stone. Electronically Signed   By: Rolm Baptise M.D.   On: 08/04/2019 17:45   CT Renal Stone  Study  Result Date: 08/04/2019 CLINICAL DATA:  Gross hematuria, history of kidney stones EXAM: CT ABDOMEN AND PELVIS WITHOUT CONTRAST TECHNIQUE: Multidetector CT imaging of the abdomen and pelvis was performed following the standard protocol without IV contrast. COMPARISON:  02/05/2019 FINDINGS: Lower chest: No acute abnormality. Hepatobiliary: Hepatic steatosis. Cholelithiasis. No biliary dilatation. Pancreas: Unremarkable. Spleen: Unremarkable. Adrenals/Urinary Tract: Adrenals are unremarkable. Renal cortical thinning. Stable small right upper pole renal cyst. Bilateral nonobstructing renal calculi are again identified measuring up to 3 mm. There is a 5 mm obstructing calculus within the proximal left ureter with resulting hydroureteronephrosis. Poorly distended bladder is unremarkable. Stomach/Bowel: Stomach is within normal limits. Bowel is normal in caliber. Small bowel loops are closely apposed to the ventral abdominal wall. Mild diverticulosis. Vascular/Lymphatic: Aortic atherosclerosis. No enlarged abdominal or pelvic lymph nodes. No adenopathy. Reproductive: Marked prostate enlargement. Other: No ascites. Small fat containing inguinal hernias. Tiny fat containing left parasagittal ventral hernia no longer contains a bowel loop. Musculoskeletal: No acute osseous finding. IMPRESSION: 5 mm obstructing calculus of the proximal left ureter with hydroureteronephrosis. Additional nonobstructing renal calculi. Additional stable findings as detailed above. Electronically Signed   By: Macy Mis M.D.   On: 08/04/2019 19:21   IR NEPHROSTOMY PLACEMENT LEFT  Result Date: 08/06/2019 INDICATION: 74 year old male with an obstructing left ureteral calculus and resultant hydronephrosis. He presents for attempted nephrostomy tube placement. EXAM: IR NEPHROSTOMY PLACEMENT LEFT COMPARISON:  None. MEDICATIONS: 2 g Ancef; The antibiotic was administered in an appropriate time frame prior to skin puncture.  ANESTHESIA/SEDATION: Fentanyl 100 mcg IV; Versed 2 mg IV Moderate Sedation Time:  10 minutes The patient was continuously monitored during the procedure by the interventional radiology nurse under my direct supervision. CONTRAST:  8 mL Omnipaque 300-administered into the collecting system(s) FLUOROSCOPY TIME:  Fluoroscopy Time: 0 minutes 42 seconds (46 mGy). COMPLICATIONS: None immediate. TECHNIQUE: The procedure, risks, benefits, and alternatives were explained to the patient. Questions regarding the procedure were encouraged and answered. The patient understands and consents to the procedure. The left flank was prepped with chlorhexidine in a sterile fashion, and a sterile drape was applied covering the operative field. A sterile gown and sterile gloves were used for the procedure. Local anesthesia was provided with 1% Lidocaine. The left flank was interrogated with ultrasound and the left kidney identified. The kidney is hydronephrotic. A suitable access site on the skin overlying the lower pole, posterior calix was identified. After local mg anesthesia was achieved, a small skin nick was made with an 11 blade scalpel. A 21 gauge Accustick needle was then advanced under direct sonographic guidance into the lower pole of the left kidney. A 0.018 inch wire was advanced under fluoroscopic guidance into the left renal collecting system. The Accustick sheath was then advanced over the wire and a 0.018 system exchanged for a 0.035 system. Gentle hand injection of contrast material confirms placement of the sheath within the renal collecting system. There is moderate hydronephrosis. The tract from the scan into the renal collecting system was then dilated serially to 10-French. A 10-French Cook all-purpose drain was then placed  and positioned under fluoroscopic guidance. The locking loop is well formed within the left renal pelvis. The catheter was secured to the skin with 2-0 Prolene and a sterile bandage was placed.  Catheter was left to gravity bag drainage. IMPRESSION: Successful placement of a left 10 French percutaneous nephrostomy tube. Electronically Signed   By: Jacqulynn Cadet M.D.   On: 08/06/2019 13:56    Labs:  CBC: Recent Labs    08/05/19 0515 08/06/19 0454 08/06/19 1412 08/07/19 0248  WBC 12.5* 8.7 2.3* 14.5*  HGB 12.9* 12.9* 14.3 12.6*  HCT 40.0 39.7 44.9 39.3  PLT 212 225 221 216    COAGS: Recent Labs    02/04/19 1354 08/05/19 0856  INR 1.0 1.1    BMP: Recent Labs    08/04/19 1702 08/05/19 0515 08/06/19 0454 08/07/19 0248  NA 141 140 140 138  K 3.4* 3.5 3.7 4.0  CL 109 108 110 111  CO2 20* 26 21* 20*  GLUCOSE 198* 148* 110* 103*  BUN 22 20 17 17   CALCIUM 8.6* 8.3* 8.1* 8.2*  CREATININE 1.50* 1.50* 1.21 1.35*  GFRNONAA 45* 45* 58* 51*  GFRAA 52* 52* >60 59*    LIVER FUNCTION TESTS: Recent Labs    08/04/19 1702 08/05/19 0515 08/07/19 0248  BILITOT 1.1 1.0 0.9  AST 16 13* 20  ALT 16 14 15   ALKPHOS 106 87 77  PROT 6.8 5.8* 5.4*  ALBUMIN 3.3* 2.7* 2.4*    Assessment and Plan: 74 year old male with history of nephrolithiasis and attempted though ultimately unsuccessful left percutaneous nephrostomy secondary to lack of hydronephrosis and patient's extreme obesity on 02/05/2019.  He also has a history of failed cystoscopic management of the left kidney stone disease due to prostatomegaly and very difficult lower ureteral access.  He was admitted to [facility] 08/04/19 with history of weakness, foul-smelling urine,  left flank discomfort, occ fever and chills; status post left percutaneous nephrostomy on 4/6 with some transient bacteremic symptoms afterwards; currently stable, afebrile, WBC 14.5, hemoglobin 12.6, creatinine 1.35, blood cultures negative to date, urine culture pending.  Troponins elevated.  Chest x-ray negative.  Abdominal film unremarkable.  Continue to monitor labs, nephrostomy output.  Further plans as per  urology/CCM.   Electronically Signed: D. Rowe Robert, PA-C 08/07/2019, 11:08 AM   I spent a total of 15 minutes at the the patient's bedside AND on the patient's hospital floor or unit, greater than 50% of which was counseling/coordinating care for left nephrostomy    Patient ID: [name], male   DOB: [birth year], 74 y.o.   MRN: [id number]

## 2019-08-07 NOTE — Consult Note (Signed)
Admit date: 08/04/2019 Referring Physician  Dr. Estill Cotta Primary Physician  Sr. Jeraldine Loots Primary Cardiologist  Non Reason for Consultation  Elevated troponin  HPI: [name] is a 74 y.o. male who is being seen today for the evaluation of elevated troponin at the request of Rapudeep Rai, MD.    This is a 74yo male with a hx of chronic migraines, CKD stage 3, morbid obesity and nephrolithiasis.  He was admitted with fever and weakness.  He has a hx of  left sided kiney stone that was too large to pass on its own and was unable to be stented due to enlarge prostate and unable to have nephrostomy tube due to body habitus.  This has been going on for the past 6 months.  He was admitted with infected kidney stones with elevated WBC of 17.2 and obstructive uropathy with hydronephrosis.  Started on IVF and antibx.    Since admission he underwent left sided nephrostomy tube placement and post procedure became tachypneic with rigors and hypertensive emergency with BP 225/18mmg and HR 126 with elevated procalcitonin at 71, acidosis, hypoxemic with mottled appearance and was treated with IVF and morphine with improvement.  Cxray was normal and it was felt that his decompensation was related to sepsis.  Trop though was elevated at 589>>1906>>1707 with no acute changes on EKG and no CP.  2D echo is pending.    He denies any history of chest pain or pressure, SOB, PND, orthopnea or LE edema.  He has a remote hx of tobacco use but quit 24 yrs ago.   No fm hx of CAD.      PMH:   Past Medical History:  Diagnosis Date  . Chronic kidney disease    RENAL STONES  . High frequency hearing loss   . History of migraine headaches   . Hx of colonic polyps   . Obesity   . Seborrheic dermatitis   . Vitiligo      PSH:   Past Surgical History:  Procedure Laterality Date  . COLON SURGERY  01/2001  . CYSTOSCOPY/URETEROSCOPY/HOLMIUM LASER/STENT PLACEMENT Left 02/04/2019   Procedure: CYSTOSCOPY  Left retrograde and attempted left ureteroscopy;  Surgeon: Raynelle Bring, MD;  Location: [facility];  Service: Urology;  Laterality: Left;  ONLY NEEDS 60 MIN  . FRACTURE SURGERY    . IR NEPHROSTOMY PLACEMENT LEFT  02/05/2019  . IR NEPHROSTOMY PLACEMENT LEFT  08/06/2019  . TONSILLECTOMY AND ADENOIDECTOMY      Allergies:  Codeine Prior to Admit Meds:   Medications Prior to Admission  Medication Sig Dispense Refill Last Dose  . cefdinir (OMNICEF) 300 MG capsule Take 1 capsule (300 mg total) by mouth 2 (two) times daily. (Patient not taking: Reported on 08/04/2019) 14 capsule 0 Not Taking at Unknown time  . oxyCODONE-acetaminophen (PERCOCET) 5-325 MG tablet Take 1 tablet by mouth every 4 (four) hours as needed for severe pain. (Patient not taking: Reported on 08/04/2019) 20 tablet 0 Not Taking at Unknown time   Fam HX:   History reviewed. No pertinent family history. Social HX:    Social History   Socioeconomic History  . Marital status: Single    Spouse name: Not on file  . Number of children: Not on file  . Years of education: Not on file  . Highest education level: Not on file  Occupational History  . Not on file  Tobacco Use  . Smoking status: Former Smoker    Packs/day: 1.00  Years: 40.00    Pack years: 40.00    Types: Cigarettes    Quit date: 42    Years since quitting: 24.2  . Smokeless tobacco: Never Used  Substance and Sexual Activity  . Alcohol use: Not Currently  . Drug use: Never  . Sexual activity: Not on file  Other Topics Concern  . Not on file  Social History Narrative  . Not on file   Social Determinants of Health   Financial Resource Strain:   . Difficulty of Paying Living Expenses:   Food Insecurity:   . Worried About Charity fundraiser in the Last Year:   . Arboriculturist in the Last Year:   Transportation Needs:   . Film/video editor (Medical):   Marland Kitchen Lack of Transportation (Non-Medical):   Physical Activity:   . Days of Exercise per Week:   .  Minutes of Exercise per Session:   Stress:   . Feeling of Stress :   Social Connections:   . Frequency of Communication with Friends and Family:   . Frequency of Social Gatherings with Friends and Family:   . Attends Religious Services:   . Active Member of Clubs or Organizations:   . Attends Archivist Meetings:   Marland Kitchen Marital Status:   Intimate Partner Violence:   . Fear of Current or Ex-Partner:   . Emotionally Abused:   Marland Kitchen Physically Abused:   . Sexually Abused:      ROS:  All  ROS were addressed and are negative except what is stated in the HPI  Physical Exam: Blood pressure 122/74, pulse 64, temperature (!) 96.7 F (35.9 C), temperature source Axillary, resp. rate 19, height 5\' 8"  (1.727 m), weight 120.4 kg, SpO2 93 %.    General: Well developed, well nourished, in no acute distress Head: Eyes PERRLA, No xanthomas.   Normal cephalic and atramatic  Lungs:   Clear bilaterally to auscultation and percussion. Heart:   HRRR S1 S2 Pulses are 2+ & equal.            No carotid bruit. No JVD.  No abdominal bruits. No femoral bruits. Abdomen: Bowel sounds are positive, abdomen soft and non-tender without masses or                  Hernia's noted. Msk:  Back normal, normal gait. Normal strength and tone for age. Extremities:   No clubbing, cyanosis or edema.  DP +1 Neuro: Alert and oriented X 3. Psych:  Good affect, responds appropriately    Labs:   Lab Results  Component Value Date   WBC 14.5 (H) 08/07/2019   HGB 12.6 (L) 08/07/2019   HCT 39.3 08/07/2019   MCV 88.7 08/07/2019   PLT 216 08/07/2019    Recent Labs  Lab 08/07/19 0248  NA 138  K 4.0  CL 111  CO2 20*  BUN 17  CREATININE 1.35*  CALCIUM 8.2*  PROT 5.4*  BILITOT 0.9  ALKPHOS 77  ALT 15  AST 20  GLUCOSE 103*   No results found for: PTT Lab Results  Component Value Date   INR 1.1 08/05/2019   INR 1.0 02/04/2019   No results found for: CKTOTAL, CKMB, CKMBINDEX, TROPONINI  No results found  for: CHOL No results found for: HDL No results found for: LDLCALC No results found for: TRIG No results found for: CHOLHDL No results found for: LDLDIRECT    Radiology:  DG CHEST PORT 1 VIEW  Result  Date: 08/06/2019 CLINICAL DATA:  Dyspnea today. EXAM: PORTABLE CHEST 1 VIEW COMPARISON:  Single-view of the chest 08/04/2019. FINDINGS: Lungs clear. Heart size is normal. No pneumothorax or pleural effusion. No acute or focal bony abnormality. IMPRESSION: No acute disease. Electronically Signed   By: Inge Rise M.D.   On: 08/06/2019 13:56   DG Abd Portable 1V  Result Date: 08/06/2019 CLINICAL DATA:  Tachypnea nephrostomy placement EXAM: PORTABLE ABDOMEN - 1 VIEW COMPARISON:  August 04, 2019 FINDINGS: Nephrostomy placed on the left. Moderate stool in colon. No bowel dilatation or air-fluid level to suggest bowel obstruction. No evident free air. IMPRESSION: Nephrostomy catheter on left. No bowel obstruction or free air evident on supine examination. Electronically Signed   By: Lowella Grip III M.D.   On: 08/06/2019 13:54   IR NEPHROSTOMY PLACEMENT LEFT  Result Date: 08/06/2019 INDICATION: 74 year old male with an obstructing left ureteral calculus and resultant hydronephrosis. He presents for attempted nephrostomy tube placement. EXAM: IR NEPHROSTOMY PLACEMENT LEFT COMPARISON:  None. MEDICATIONS: 2 g Ancef; The antibiotic was administered in an appropriate time frame prior to skin puncture. ANESTHESIA/SEDATION: Fentanyl 100 mcg IV; Versed 2 mg IV Moderate Sedation Time:  10 minutes The patient was continuously monitored during the procedure by the interventional radiology nurse under my direct supervision. CONTRAST:  8 mL Omnipaque 300-administered into the collecting system(s) FLUOROSCOPY TIME:  Fluoroscopy Time: 0 minutes 42 seconds (46 mGy). COMPLICATIONS: None immediate. TECHNIQUE: The procedure, risks, benefits, and alternatives were explained to the patient. Questions regarding the procedure  were encouraged and answered. The patient understands and consents to the procedure. The left flank was prepped with chlorhexidine in a sterile fashion, and a sterile drape was applied covering the operative field. A sterile gown and sterile gloves were used for the procedure. Local anesthesia was provided with 1% Lidocaine. The left flank was interrogated with ultrasound and the left kidney identified. The kidney is hydronephrotic. A suitable access site on the skin overlying the lower pole, posterior calix was identified. After local mg anesthesia was achieved, a small skin nick was made with an 11 blade scalpel. A 21 gauge Accustick needle was then advanced under direct sonographic guidance into the lower pole of the left kidney. A 0.018 inch wire was advanced under fluoroscopic guidance into the left renal collecting system. The Accustick sheath was then advanced over the wire and a 0.018 system exchanged for a 0.035 system. Gentle hand injection of contrast material confirms placement of the sheath within the renal collecting system. There is moderate hydronephrosis. The tract from the scan into the renal collecting system was then dilated serially to 10-French. A 10-French Cook all-purpose drain was then placed and positioned under fluoroscopic guidance. The locking loop is well formed within the left renal pelvis. The catheter was secured to the skin with 2-0 Prolene and a sterile bandage was placed. Catheter was left to gravity bag drainage. IMPRESSION: Successful placement of a left 10 French percutaneous nephrostomy tube. Electronically Signed   By: Jacqulynn Cadet M.D.   On: 08/06/2019 13:56     Telemetry    NSR - Personally Reviewed  ECG    NSR with no ST changes - Personally Reviewed   ASSESSMENT/PLAN:    1.  Elevated troponin -hstrop elevated ([id number]>1234) but in the setting of AKI, sepsis, hypertensive emergency, tachycardia and hypoxemia -suspect this is demand ischemia -he  has not had any chest pain or pressure and EKG is nonischemic -his CRFs include advanced age, morbid obesity, remote  hx of tobacco abuse -await results of 2D echo to assess LVF -if LVF is normal then consider nuclear stress test to rule out ischemia -if LVF is reduced then will need cath  2.  Hypertensive emergency -BP was as high as 225/132mmHg but this has resolved and BP stable at 122/67mmHg -currently not on antihypertensive meds with stable BP  3.  AKI -baseline Creatnine 1.5 on admit in setting of urosepsis with hydronephrosis from obstructing renal calculus -creatinine improved today at 1.3  4.  Morbid Obesity    Fransico Him, MD  08/07/2019  2:06 PM

## 2019-08-08 DIAGNOSIS — R778 Other specified abnormalities of plasma proteins: Secondary | ICD-10-CM

## 2019-08-08 DIAGNOSIS — N10 Acute tubulo-interstitial nephritis: Secondary | ICD-10-CM | POA: Diagnosis not present

## 2019-08-08 DIAGNOSIS — N201 Calculus of ureter: Secondary | ICD-10-CM | POA: Diagnosis not present

## 2019-08-08 DIAGNOSIS — I161 Hypertensive emergency: Secondary | ICD-10-CM

## 2019-08-08 DIAGNOSIS — N200 Calculus of kidney: Secondary | ICD-10-CM | POA: Diagnosis not present

## 2019-08-08 DIAGNOSIS — R600 Localized edema: Secondary | ICD-10-CM

## 2019-08-08 LAB — CBC
HCT: 40.4 % (ref 39.0–52.0)
Hemoglobin: 13 g/dL (ref 13.0–17.0)
MCH: 28.3 pg (ref 26.0–34.0)
MCHC: 32.2 g/dL (ref 30.0–36.0)
MCV: 88 fL (ref 80.0–100.0)
Platelets: 244 10*3/uL (ref 150–400)
RBC: 4.59 MIL/uL (ref 4.22–5.81)
RDW: 14.2 % (ref 11.5–15.5)
WBC: 11.5 10*3/uL — ABNORMAL HIGH (ref 4.0–10.5)
nRBC: 0 % (ref 0.0–0.2)

## 2019-08-08 LAB — BASIC METABOLIC PANEL
Anion gap: 9 (ref 5–15)
BUN: 17 mg/dL (ref 8–23)
CO2: 22 mmol/L (ref 22–32)
Calcium: 8.3 mg/dL — ABNORMAL LOW (ref 8.9–10.3)
Chloride: 106 mmol/L (ref 98–111)
Creatinine, Ser: 1.2 mg/dL (ref 0.61–1.24)
GFR calc Af Amer: >60 mL/min (ref >60)
GFR calc non Af Amer: 59 mL/min — ABNORMAL LOW (ref >60)
Glucose, Bld: 114 mg/dL — ABNORMAL HIGH (ref 70–99)
Potassium: 4.1 mmol/L (ref 3.5–5.1)
Sodium: 137 mmol/L (ref 135–145)

## 2019-08-08 LAB — ECHOCARDIOGRAM COMPLETE
Height: 68 in
Weight: 4246.94 oz

## 2019-08-08 LAB — PROCALCITONIN: Procalcitonin: 49.36 ng/mL

## 2019-08-08 MED ORDER — ASPIRIN EC 81 MG PO TBEC
81.0000 mg | DELAYED_RELEASE_TABLET | Freq: Every day | ORAL | Status: DC
  Administered 2019-08-08 – 2019-08-13 (×5): 81 mg via ORAL
  Filled 2019-08-08 (×5): qty 1

## 2019-08-08 MED ORDER — ASPIRIN 81 MG PO CHEW
81.0000 mg | CHEWABLE_TABLET | ORAL | Status: AC
  Administered 2019-08-09: 81 mg via ORAL
  Filled 2019-08-08: qty 1

## 2019-08-08 MED ORDER — SODIUM CHLORIDE 0.9% FLUSH
3.0000 mL | Freq: Two times a day (BID) | INTRAVENOUS | Status: DC
  Administered 2019-08-08 – 2019-08-10 (×3): 3 mL via INTRAVENOUS

## 2019-08-08 MED ORDER — SODIUM CHLORIDE 0.9 % IV SOLN: 250.0000 mL | INTRAVENOUS | Status: DC | PRN

## 2019-08-08 MED ORDER — SODIUM CHLORIDE 0.9% FLUSH: 3.0000 mL | INTRAVENOUS | Status: DC | PRN

## 2019-08-08 MED ORDER — SODIUM CHLORIDE 0.9 % WEIGHT BASED INFUSION
1.0000 mL/kg/h | INTRAVENOUS | Status: DC
  Administered 2019-08-09: 1 mL/kg/h via INTRAVENOUS

## 2019-08-08 MED ORDER — FUROSEMIDE 10 MG/ML IJ SOLN
40.0000 mg | Freq: Once | INTRAMUSCULAR | Status: AC
  Administered 2019-08-08: 40 mg via INTRAVENOUS
  Filled 2019-08-08: qty 4

## 2019-08-08 MED ORDER — SODIUM CHLORIDE 0.9 % WEIGHT BASED INFUSION
3.0000 mL/kg/h | INTRAVENOUS | Status: DC
  Administered 2019-08-09: 3 mL/kg/h via INTRAVENOUS

## 2019-08-08 NOTE — Progress Notes (Signed)
2D echo reviewed and shows mild LV dysfunction with EF 45-50%.  Given elevated troponin and abnormal echo, recommend proceeding with LHC in am to define coronary anatomy.l  Renal function stable.  Will check another BMET in am.  Make NPO after MN.

## 2019-08-08 NOTE — Progress Notes (Signed)
[facility]                                                                              Patient Demographics  [name], is a 74 y.o. male, DOB - 04/06/1946, [id number]  Admit date - 08/04/2019   Admitting Physician Elwyn Reach, MD  Outpatient Primary MD for the patient is [name], MD  Outpatient specialists:   LOS - 4  days   Medical records reviewed and are as summarized below:    Chief Complaint  Patient presents with  . Fever  . Urinary Tract Infection       Brief summary   Patient is a 74 year old male with history of renal stones, migraine headaches, morbid obesity, presented to ED with left flank pain, weakness and fever.  Patient had been dealing with left-sided kidney stone for which urology had been trying to dislodge, however too large to pass on its own and was unable to get stented due to enlarged prostate.  Unable to get nephrostomy tube due to body habitus.  Patient presented with left flank pain, urology was consulted.  CT showed 5 mm obstructing calculus of the proximal left ureter with hydronephrosis, additional nonobstructing renal calculi.  UA positive for UTI  Assessment & Plan    Principal Problem: Severe sepsis with infected obstructing renal calculus, left, hydronephrosis, UTI -Urology and interventional radiology was consulted, patient underwent left-sided nephrostomy tube placement -Post procedure, on 4/6 patient was noted to have significant tachypnea, rigors, hypertensive emergency, BP 225/193, tachycardia 126, elevated procalcitonin 71.6, lactic acidosis, elevated troponin, leukocytosis.  Patient was noted to be hypoxemic, and mottled, received IV fluids, morphine, subsequently improved.  Transfered to SDU and placed on broad-spectrum IV antibiotics. -Procalcitonin trending down, 49.36 < --71.6 -Blood cultures, urine cultures negative so far, however patient was on antibiotics when the cultures were  obtained.  UA was positive on 4/4 for UTI -MRSA PCR negative, will DC vancomycin, continue IV cefepime   Active Problems:  Acute hypoxemic respiratory failure -Likely decompensated in the setting of sepsis, #1 -Chest x-ray showed no acute infiltrates or pneumothorax -Stable, O2 sats 96% on room air  Elevated troponin in the setting of severe sepsis -Troponin positive 589 on 4/6--> 1906--> 1707-> 1234-> 1141 -Currently no chest pain tachycardia no acute ST-T wave changes -Cardiology consulted.  2D echo showed EF of 45 to 50% with global hypokinesis, grade 1 diastolic dysfunction -Cardiology recommended nuclear stress test if normal LVEF, cardiac cath if LV dysfunction  Acute combined systolic and diastolic CHF -2+ pitting edema, I/O's  with 4.6 L positive, likely from fluid overload - will check weight  - will give 1 dose of IV Lasix     AKI (acute kidney injury) ([place]) Baseline creatinine unknown, creatinine 1.5 on admission, in the setting of infected obstructing left renal stone and sepsis -Creatinine now improving, 1.2    Hypokalemia Resolved  Morbid obesity Estimated body mass index is 40.36 kg/m as calculated from the following:   Height as of this encounter: 5\' 8"  (1.727 m).   Weight as of this encounter: 120.4 kg.  Code Status: Full code DVT  Prophylaxis: Heparin subcu Family Communication: Discussed all imaging results, lab results, explained to the patient    Disposition Plan: Patient from home, anticipated discharge home in next 24 to 48 hours, pending urine and blood cultures sensitivities, cardiology evaluation, PT OT evaluation  Transfer to telemetry floor Time Spent in minutes 35 minutes  Procedures:  08/06/2019 placement of a LEFT 27F percutaneous nephrostomy tube. *  Consultants:   Urology, interventional radiology  Antimicrobials:   Anti-infectives (From admission, onward)   Start     Dose/Rate Route Frequency Ordered Stop   08/07/19 1500   vancomycin (VANCOREADY) IVPB 1250 mg/250 mL  Status:  Discontinued     1,250 mg 166.7 mL/hr over 90 Minutes Intravenous Every 12 hours 08/06/19 1407 08/06/19 1413   08/07/19 1500  vancomycin (VANCOREADY) IVPB 1250 mg/250 mL     1,250 mg 166.7 mL/hr over 90 Minutes Intravenous Every 24 hours 08/06/19 1413     08/06/19 2200  ceFEPIme (MAXIPIME) 2 g in sodium chloride 0.9 % 100 mL IVPB     2 g 200 mL/hr over 30 Minutes Intravenous Every 8 hours 08/06/19 1407     08/06/19 1500  vancomycin (VANCOCIN) 2,500 mg in sodium chloride 0.9 % 500 mL IVPB     2,500 mg 250 mL/hr over 120 Minutes Intravenous  Once 08/06/19 1407 08/06/19 1850   08/06/19 1415  ceFEPIme (MAXIPIME) 2 g in sodium chloride 0.9 % 100 mL IVPB     2 g 200 mL/hr over 30 Minutes Intravenous  Once 08/06/19 1407 08/06/19 1554   08/06/19 1140  ceFAZolin (ANCEF) 2-4 GM/100ML-% IVPB    Note to Pharmacy: Lesia Hausen   : cabinet override      08/06/19 1140 08/06/19 1152   08/05/19 1800  cefTRIAXone (ROCEPHIN) 1 g in sodium chloride 0.9 % 100 mL IVPB  Status:  Discontinued     1 g 200 mL/hr over 30 Minutes Intravenous Every 24 hours 08/04/19 2236 08/06/19 1408   08/04/19 1830  cefTRIAXone (ROCEPHIN) 2 g in sodium chloride 0.9 % 100 mL IVPB     2 g 200 mL/hr over 30 Minutes Intravenous  Once 08/04/19 1807 08/04/19 1930         Medications  Scheduled Meds: . aspirin EC  81 mg Oral Daily  . Chlorhexidine Gluconate Cloth  6 each Topical Daily  . heparin injection (subcutaneous)  5,000 Units Subcutaneous Q8H  . mouth rinse  15 mL Mouth Rinse BID  . sodium chloride flush  5 mL Intracatheter Q8H   Continuous Infusions: . ceFEPime (MAXIPIME) IV Stopped (08/08/19 1335)  . vancomycin Stopped (08/07/19 1722)   PRN Meds:.acetaminophen **OR** acetaminophen, morphine injection, ondansetron **OR** ondansetron (ZOFRAN) IV, traMADol      Subjective:   [name] was seen and examined today.  No acute issues overnight.  Has  chronically shortness of breath with exertion however has 2+ pitting edema.  States his legs are always swollen.  Left flank pain 5/10.  Patient denies dizziness, N/V/D/C, new weakness, numbess, tingling. No acute events overnight.    Objective:   Vitals:   08/08/19 0330 08/08/19 0400 08/08/19 0800 08/08/19 1200  BP:  130/61 (!) 160/87 (!) 143/93  Pulse:  83 75 71  Resp:   (!) 23 17  Temp: 98.2 F (36.8 C)  98.1 F (36.7 C) (!) 97.5 F (36.4 C)  TempSrc: Oral  Oral Oral  SpO2:  94% 92% 94%  Weight:      Height:  Intake/Output Summary (Last 24 hours) at 08/08/2019 1350 Last data filed at 08/08/2019 1300 Gross per 24 hour  Intake 990.37 ml  Output 2625 ml  Net -1634.63 ml     Wt Readings from Last 3 Encounters:  08/04/19 120.4 kg  02/04/19 128.9 kg  01/31/19 128 kg    Physical Exam  General: Alert and oriented x 3, NAD  Cardiovascular: S1 S2 clear, RRR.  1-2+ pedal edema b/l  Respiratory: Decreased breath sound at the bases with scattered rhonchi  Gastrointestinal: Soft, left CVAT, nondistended, NBS  Ext: 1-2+ pedal edema bilaterally  Neuro: no new deficits  Musculoskeletal: No cyanosis, clubbing  Skin: No rashes  Psych: Normal affect and demeanor, alert and oriented x3       Data Reviewed:  I have personally reviewed following labs and imaging studies  Micro Results Recent Results (from the past 240 hour(s))  Culture, blood (routine x 2)     Status: None (Preliminary result)   Collection Time: 08/04/19  5:10 PM   Specimen: BLOOD LEFT FOREARM  Result Value Ref Range Status   Specimen Description   Final    BLOOD LEFT FOREARM Performed at [facility], [address], [place], [place] [address]    Special Requests   Final    BOTTLES DRAWN AEROBIC AND ANAEROBIC Blood Culture results may not be optimal due to an excessive volume of blood received in culture bottles Performed at [facility] [address] [place], [place] [address]    Culture   Final    NO GROWTH 4 DAYS Performed at [facility], [place] [address] [place], [place] [address]    Report Status PENDING  Incomplete  Culture, blood (routine x 2)     Status: None (Preliminary result)   Collection Time: 08/04/19  5:15 PM   Specimen: BLOOD  Result Value Ref Range Status   Specimen Description   Final    BLOOD BLOOD RIGHT FOREARM Performed at [facility] [address], [place], [place] [address]    Special Requests   Final    BOTTLES DRAWN AEROBIC AND ANAEROBIC Blood Culture adequate volume Performed at [facility] [address] [place], [place] [address]    Culture   Final    NO GROWTH 4 DAYS Performed at [facility], [place] [address] [place], [place] [address]    Report Status PENDING  Incomplete  Respiratory Panel by RT PCR (Flu A&B, Covid) - Nasopharyngeal Swab     Status: None   Collection Time: 08/04/19  7:21 PM   Specimen: Nasopharyngeal Swab  Result Value Ref Range Status   SARS Coronavirus 2 by RT PCR NEGATIVE NEGATIVE Final    Comment: (NOTE) SARS-CoV-2 target nucleic acids are NOT DETECTED. The SARS-CoV-2 RNA is generally detectable in upper respiratoy specimens during the acute phase of infection. The lowest concentration of SARS-CoV-2 viral copies this assay can detect is 131 copies/mL. A negative result does not preclude SARS-Cov-2 infection and should not be used as the sole basis for treatment or other patient management decisions. A negative result may occur with  improper specimen collection/handling, submission of specimen other than nasopharyngeal swab, presence of viral mutation(s) within the areas targeted by this assay, and inadequate number of viral copies (<131 copies/mL). A negative result must be combined with clinical observations, patient history, and epidemiological information. The expected result is Negative. Fact Sheet for  Patients:  PinkCheek.be Fact Sheet  for Healthcare Providers:  GravelBags.it This test is not yet ap proved or cleared by the [place] and  has been authorized for detection and/or diagnosis of SARS-CoV-2 by FDA under an Emergency Use Authorization (EUA). This EUA will remain  in effect (meaning this test can be used) for the duration of the COVID-19 declaration under Section 564(b)(1) of the Act, 21 U.S.C. section 360bbb-3(b)(1), unless the authorization is terminated or revoked sooner.    Influenza A by PCR NEGATIVE NEGATIVE Final   Influenza B by PCR NEGATIVE NEGATIVE Final    Comment: (NOTE) The Xpert Xpress SARS-CoV-2/FLU/RSV assay is intended as an aid in  the diagnosis of influenza from Nasopharyngeal swab specimens and  should not be used as a sole basis for treatment. Nasal washings and  aspirates are unacceptable for Xpert Xpress SARS-CoV-2/FLU/RSV  testing. Fact Sheet for Patients: PinkCheek.be Fact Sheet for Healthcare Providers: GravelBags.it This test is not yet approved or cleared by the [place] FDA and  has been authorized for detection and/or diagnosis of SARS-CoV-2 by  FDA under an Emergency Use Authorization (EUA). This EUA will remain  in effect (meaning this test can be used) for the duration of the  Covid-19 declaration under Section 564(b)(1) of the Act, 21  U.S.C. section 360bbb-3(b)(1), unless the authorization is  terminated or revoked. Performed at [facility], [place] [address], [place], [place] [address]   Culture, blood (routine x 2)     Status: None (Preliminary result)   Collection Time: 08/06/19  2:29 PM   Specimen: BLOOD  Result Value Ref Range Status   Specimen Description   Final    BLOOD RIGHT ANTECUBITAL Performed at [facility] [address], [place], [place] [address]     Special Requests   Final    BOTTLES DRAWN AEROBIC AND ANAEROBIC Blood Culture adequate volume Performed at [facility] [address], [place], [place] [address]    Culture   Final    NO GROWTH 2 DAYS Performed at [facility] [address] [place], [place] [address]    Report Status PENDING  Incomplete  Culture, blood (routine x 2)     Status: None (Preliminary result)   Collection Time: 08/06/19  2:31 PM   Specimen: BLOOD  Result Value Ref Range Status   Specimen Description   Final    BLOOD RIGHT ANTECUBITAL Performed at [facility] [address] [place], [place] [address]    Special Requests   Final    BOTTLES DRAWN AEROBIC AND ANAEROBIC Blood Culture adequate volume Performed at [facility] [address], [place], [place] [address]    Culture   Final    NO GROWTH 2 DAYS Performed at [facility] [address], [place], [place] [address]    Report Status PENDING  Incomplete  Culture, Urine     Status: None   Collection Time: 08/06/19  3:29 PM   Specimen: Urine, Random  Result Value Ref Range Status   Specimen Description   Final    URINE, RANDOM Performed at [facility] [address], [place], [place] [address]    Special Requests   Final    NONE Performed at [facility], [place] [address], [place], [place] [address]    Culture   Final    NO GROWTH Performed at [facility], [place] [address], [place], [place] [address]  Report Status 08/07/2019 FINAL  Final  MRSA PCR Screening     Status: None   Collection Time: 08/06/19  6:38 PM   Specimen: Nasal Mucosa; Nasopharyngeal  Result Value Ref Range Status   MRSA by PCR NEGATIVE NEGATIVE Final    Comment:        The GeneXpert MRSA Assay (FDA approved for NASAL specimens only), is one component of a comprehensive MRSA colonization surveillance program. It is not intended to diagnose  MRSA infection nor to guide or monitor treatment for MRSA infections. Performed at [facility], [place] [address], [place], [place] [address]     Radiology Reports DG CHEST PORT 1 VIEW  Result Date: 08/06/2019 CLINICAL DATA:  Dyspnea today. EXAM: PORTABLE CHEST 1 VIEW COMPARISON:  Single-view of the chest 08/04/2019. FINDINGS: Lungs clear. Heart size is normal. No pneumothorax or pleural effusion. No acute or focal bony abnormality. IMPRESSION: No acute disease. Electronically Signed   By: Inge Rise M.D.   On: 08/06/2019 13:56   DG Chest Portable 1 View  Result Date: 08/04/2019 CLINICAL DATA:  Fever EXAM: PORTABLE CHEST 1 VIEW COMPARISON:  06/09/2005 FINDINGS: Cardiomegaly. No confluent opacities, effusions or edema. No acute bony abnormality. IMPRESSION: Cardiomegaly.  No active disease. Electronically Signed   By: Rolm Baptise M.D.   On: 08/04/2019 17:43   DG Abd Portable 1V  Result Date: 08/06/2019 CLINICAL DATA:  Tachypnea nephrostomy placement EXAM: PORTABLE ABDOMEN - 1 VIEW COMPARISON:  August 04, 2019 FINDINGS: Nephrostomy placed on the left. Moderate stool in colon. No bowel dilatation or air-fluid level to suggest bowel obstruction. No evident free air. IMPRESSION: Nephrostomy catheter on left. No bowel obstruction or free air evident on supine examination. Electronically Signed   By: Lowella Grip III M.D.   On: 08/06/2019 13:54   DG Abd Portable 1 View  Result Date: 08/04/2019 CLINICAL DATA:  Fever, renal stone EXAM: PORTABLE ABDOMEN - 1 VIEW COMPARISON:  CT 02/05/2019 FINDINGS: Study limited by body habitus. Calcification projects over the left abdomen, likely left renal stone or possibly the previously seen left renal pelvic stone. No definite calcifications over the course of the ureters although small stones may be difficult to visualize. Nonobstructive bowel gas pattern. No organomegaly. IMPRESSION: Limited study due to body habitus. Calcification in the  left abdomen could reflect the previously seen left renal or renal pelvic stone. Electronically Signed   By: Rolm Baptise M.D.   On: 08/04/2019 17:45   ECHOCARDIOGRAM COMPLETE  Result Date: 08/08/2019    ECHOCARDIOGRAM REPORT   Patient Name:   [name] Date of Exam: 08/07/2019 Medical Rec #:  [id number]         Height:       68.0 in Accession #:    [id number]        Weight:       265.4 lb Date of Birth:  [birth year]         BSA:          2.305 m Patient Age:    39 years          BP:           122/74 mmHg Patient Gender: M                 HR:           71 bpm. Exam Location:  Inpatient Procedure: 2D Echo, Cardiac Doppler and Color Doppler Indications:     Elevated Troponin  History:  Patient has no prior history of Echocardiogram examinations.                  Morbid obesity; Risk Factors:Hypertension. AKI, sepsis, CKD,                  elevated Troponin.  Sonographer:     Dustin Flock Referring Phys:  [place] Diagnosing Phys: Lyman Bishop MD  Sonographer Comments: Patient is morbidly obese. IMPRESSIONS  1. Left ventricular ejection fraction, by estimation, is 45 to 50%. The left ventricle has low normal function. The left ventricle demonstrates global hypokinesis. There is mild left ventricular hypertrophy. Left ventricular diastolic parameters are consistent with Grade I diastolic dysfunction (impaired relaxation). There is incoordinate septal motion.  2. Right ventricular systolic function is low normal. The right ventricular size is normal. There is moderately elevated pulmonary artery systolic pressure. The estimated right ventricular systolic pressure is 99991111 mmHg.  3. The mitral valve is abnormal. Trivial mitral valve regurgitation.  4. The aortic valve is tricuspid. Aortic valve regurgitation is not visualized.  5. The inferior vena cava is normal in size with greater than 50% respiratory variability, suggesting right atrial pressure of 3 mmHg. FINDINGS  Left Ventricle: Left  ventricular ejection fraction, by estimation, is 45 to 50%. The left ventricle has low normal function. The left ventricle demonstrates global hypokinesis. The left ventricular internal cavity size was normal in size. There is mild left ventricular hypertrophy. Incoordinate septal motion and abnormal (paradoxical) septal motion, consistent with left bundle branch block. Left ventricular diastolic parameters are consistent with Grade I diastolic dysfunction (impaired relaxation). Indeterminate filling pressures. Right Ventricle: The right ventricular size is normal. No increase in right ventricular wall thickness. Right ventricular systolic function is low normal. There is moderately elevated pulmonary artery systolic pressure. The tricuspid regurgitant velocity  is 3.63 m/s, and with an assumed right atrial pressure of 3 mmHg, the estimated right ventricular systolic pressure is 99991111 mmHg. Left Atrium: Left atrial size was normal in size. Right Atrium: Right atrial size was normal in size. Pericardium: There is no evidence of pericardial effusion. Mitral Valve: The mitral valve is abnormal. There is mild thickening of the mitral valve leaflet(s). Mild mitral annular calcification. Trivial mitral valve regurgitation. Tricuspid Valve: The tricuspid valve is grossly normal. Tricuspid valve regurgitation is mild. Aortic Valve: The aortic valve is tricuspid. Aortic valve regurgitation is not visualized. Pulmonic Valve: The pulmonic valve was normal in structure. Pulmonic valve regurgitation is not visualized. Aorta: The aortic root and ascending aorta are structurally normal, with no evidence of dilitation. Venous: The inferior vena cava is normal in size with greater than 50% respiratory variability, suggesting right atrial pressure of 3 mmHg. IAS/Shunts: No atrial level shunt detected by color flow Doppler.  LEFT VENTRICLE PLAX 2D LVIDd:         5.00 cm  Diastology LVIDs:         3.70 cm  LV e' lateral:   9.57 cm/s LV  PW:         1.20 cm  LV E/e' lateral: 6.1 LV IVS:        1.20 cm  LV e' medial:    7.18 cm/s LVOT diam:     2.60 cm  LV E/e' medial:  8.1 LV SV:         103 LV SV Index:   45 LVOT Area:     5.31 cm  RIGHT VENTRICLE RV Basal diam:  3.20 cm RV  S prime:     10.30 cm/s TAPSE (M-mode): 2.4 cm LEFT ATRIUM             Index       RIGHT ATRIUM           Index LA diam:        4.10 cm 1.78 cm/m  RA Area:     18.50 cm LA Vol (A2C):   47.3 ml 20.52 ml/m RA Volume:   63.00 ml  27.33 ml/m LA Vol (A4C):   46.3 ml 20.09 ml/m LA Biplane Vol: 48.4 ml 21.00 ml/m  AORTIC VALVE LVOT Vmax:   93.30 cm/s LVOT Vmean:  57.700 cm/s LVOT VTI:    0.194 m  AORTA Ao Root diam: 3.10 cm MITRAL VALVE               TRICUSPID VALVE MV Area (PHT): 3.77 cm    TR Peak grad:   52.7 mmHg MV Decel Time: 201 msec    TR Vmax:        363.00 cm/s MV E velocity: 58.00 cm/s MV A velocity: 91.30 cm/s  SHUNTS MV E/A ratio:  0.64        Systemic VTI:  0.19 m                            Systemic Diam: 2.60 cm Lyman Bishop MD Electronically signed by Lyman Bishop MD Signature Date/Time: 08/07/2019/4:39:54 PM    Final (Updated)    CT Renal Stone Study  Result Date: 08/04/2019 CLINICAL DATA:  Gross hematuria, history of kidney stones EXAM: CT ABDOMEN AND PELVIS WITHOUT CONTRAST TECHNIQUE: Multidetector CT imaging of the abdomen and pelvis was performed following the standard protocol without IV contrast. COMPARISON:  02/05/2019 FINDINGS: Lower chest: No acute abnormality. Hepatobiliary: Hepatic steatosis. Cholelithiasis. No biliary dilatation. Pancreas: Unremarkable. Spleen: Unremarkable. Adrenals/Urinary Tract: Adrenals are unremarkable. Renal cortical thinning. Stable small right upper pole renal cyst. Bilateral nonobstructing renal calculi are again identified measuring up to 3 mm. There is a 5 mm obstructing calculus within the proximal left ureter with resulting hydroureteronephrosis. Poorly distended bladder is unremarkable. Stomach/Bowel: Stomach is  within normal limits. Bowel is normal in caliber. Small bowel loops are closely apposed to the ventral abdominal wall. Mild diverticulosis. Vascular/Lymphatic: Aortic atherosclerosis. No enlarged abdominal or pelvic lymph nodes. No adenopathy. Reproductive: Marked prostate enlargement. Other: No ascites. Small fat containing inguinal hernias. Tiny fat containing left parasagittal ventral hernia no longer contains a bowel loop. Musculoskeletal: No acute osseous finding. IMPRESSION: 5 mm obstructing calculus of the proximal left ureter with hydroureteronephrosis. Additional nonobstructing renal calculi. Additional stable findings as detailed above. Electronically Signed   By: Macy Mis M.D.   On: 08/04/2019 19:21   IR NEPHROSTOMY PLACEMENT LEFT  Result Date: 08/06/2019 INDICATION: 73 year old male with an obstructing left ureteral calculus and resultant hydronephrosis. He presents for attempted nephrostomy tube placement. EXAM: IR NEPHROSTOMY PLACEMENT LEFT COMPARISON:  None. MEDICATIONS: 2 g Ancef; The antibiotic was administered in an appropriate time frame prior to skin puncture. ANESTHESIA/SEDATION: Fentanyl 100 mcg IV; Versed 2 mg IV Moderate Sedation Time:  10 minutes The patient was continuously monitored during the procedure by the interventional radiology nurse under my direct supervision. CONTRAST:  8 mL Omnipaque 300-administered into the collecting system(s) FLUOROSCOPY TIME:  Fluoroscopy Time: 0 minutes 42 seconds (46 mGy). COMPLICATIONS: None immediate. TECHNIQUE: The procedure, risks, benefits, and alternatives were explained to the patient. Questions regarding the  procedure were encouraged and answered. The patient understands and consents to the procedure. The left flank was prepped with chlorhexidine in a sterile fashion, and a sterile drape was applied covering the operative field. A sterile gown and sterile gloves were used for the procedure. Local anesthesia was provided with 1% Lidocaine.  The left flank was interrogated with ultrasound and the left kidney identified. The kidney is hydronephrotic. A suitable access site on the skin overlying the lower pole, posterior calix was identified. After local mg anesthesia was achieved, a small skin nick was made with an 11 blade scalpel. A 21 gauge Accustick needle was then advanced under direct sonographic guidance into the lower pole of the left kidney. A 0.018 inch wire was advanced under fluoroscopic guidance into the left renal collecting system. The Accustick sheath was then advanced over the wire and a 0.018 system exchanged for a 0.035 system. Gentle hand injection of contrast material confirms placement of the sheath within the renal collecting system. There is moderate hydronephrosis. The tract from the scan into the renal collecting system was then dilated serially to 10-French. A 10-French Cook all-purpose drain was then placed and positioned under fluoroscopic guidance. The locking loop is well formed within the left renal pelvis. The catheter was secured to the skin with 2-0 Prolene and a sterile bandage was placed. Catheter was left to gravity bag drainage. IMPRESSION: Successful placement of a left 10 French percutaneous nephrostomy tube. Electronically Signed   By: Jacqulynn Cadet M.D.   On: 08/06/2019 13:56    Lab Data:  CBC: Recent Labs  Lab 08/04/19 1702 08/04/19 1702 08/05/19 0515 08/06/19 0454 08/06/19 1412 08/07/19 0248 08/08/19 0246  WBC 17.2*   < > 12.5* 8.7 2.3* 14.5* 11.5*  NEUTROABS 14.9*  --   --  6.0 1.8  --   --   HGB 14.2   < > 12.9* 12.9* 14.3 12.6* 13.0  HCT 43.2   < > 40.0 39.7 44.9 39.3 40.4  MCV 86.6   < > 87.3 87.8 88.9 88.7 88.0  PLT 271   < > 212 225 221 216 244   < > = values in this interval not displayed.   Basic Metabolic Panel: Recent Labs  Lab 08/04/19 1702 08/05/19 0515 08/06/19 0454 08/07/19 0248 08/08/19 0246  NA 141 140 140 138 137  K 3.4* 3.5 3.7 4.0 4.1  CL 109 108 110 111  106  CO2 20* 26 21* 20* 22  GLUCOSE 198* 148* 110* 103* 114*  BUN 22 20 17 17 17   CREATININE 1.50* 1.50* 1.21 1.35* 1.20  CALCIUM 8.6* 8.3* 8.1* 8.2* 8.3*   GFR: Estimated Creatinine Clearance: 67.1 mL/min (by C-G formula based on SCr of 1.2 mg/dL). Liver Function Tests: Recent Labs  Lab 08/04/19 1702 08/05/19 0515 08/07/19 0248  AST 16 13* 20  ALT 16 14 15   ALKPHOS 106 87 77  BILITOT 1.1 1.0 0.9  PROT 6.8 5.8* 5.4*  ALBUMIN 3.3* 2.7* 2.4*   No results for input(s): LIPASE, AMYLASE in the last 168 hours. No results for input(s): AMMONIA in the last 168 hours. Coagulation Profile: Recent Labs  Lab 08/05/19 0856  INR 1.1   Cardiac Enzymes: No results for input(s): CKTOTAL, CKMB, CKMBINDEX, TROPONINI in the last 168 hours. BNP (last 3 results) No results for input(s): PROBNP in the last 8760 hours. HbA1C: No results for input(s): HGBA1C in the last 72 hours. CBG: No results for input(s): GLUCAP in the last 168 hours. Lipid Profile:  No results for input(s): CHOL, HDL, LDLCALC, TRIG, CHOLHDL, LDLDIRECT in the last 72 hours. Thyroid Function Tests: No results for input(s): TSH, T4TOTAL, FREET4, T3FREE, THYROIDAB in the last 72 hours. Anemia Panel: No results for input(s): VITAMINB12, FOLATE, FERRITIN, TIBC, IRON, RETICCTPCT in the last 72 hours. Urine analysis:    Component Value Date/Time   COLORURINE AMBER (A) 08/04/2019 1702   APPEARANCEUR CLOUDY (A) 08/04/2019 1702   LABSPEC 1.017 08/04/2019 1702   PHURINE 6.0 08/04/2019 1702   GLUCOSEU NEGATIVE 08/04/2019 1702   HGBUR SMALL (A) 08/04/2019 1702   BILIRUBINUR NEGATIVE 08/04/2019 [place] 08/04/2019 1702   PROTEINUR 30 (A) 08/04/2019 1702   NITRITE NEGATIVE 08/04/2019 1702   LEUKOCYTESUR LARGE (A) 08/04/2019 1702     Misha Antonini M.D. [facility] 08/08/2019, 1:50 PM   Call night coverage person covering after 7pm

## 2019-08-08 NOTE — Progress Notes (Signed)
Patient ID: [name], male   DOB: [birth year], 74 y.o.   MRN: [id number]    Subjective: Pt continuing to feel better.  Abdominal pain less severe.  Objective: Vital signs in last 24 hours: Temp:  [97.5 F (36.4 C)-98.6 F (37 C)] 97.5 F (36.4 C) (04/08 1510) Pulse Rate:  [70-83] 78 (04/08 1510) Resp:  [17-25] 20 (04/08 1510) BP: (130-160)/(61-93) 147/84 (04/08 1510) SpO2:  [92 %-99 %] 96 % (04/08 1510) Weight:  [124.4 kg] 124.4 kg (04/08 1407)  Intake/Output from previous day: 04/07 0701 - 04/08 0700 In: 378.7 [P.O.:240; I.V.:5; IV Piggyback:123.7] Out: 2125 [Urine:2125] Intake/Output this shift: Total I/O In: 856.6 [P.O.:500; Other:5; IV Piggyback:351.6] Out: 2350 [Urine:2350]  Physical Exam:  General: Alert and oriented Abd: Minimal left abdominal tenderness  Lab Results: Recent Labs    08/06/19 1412 08/07/19 0248 08/08/19 0246  HGB 14.3 12.6* 13.0  HCT 44.9 39.3 40.4   BMET Recent Labs    08/07/19 0248 08/08/19 0246  NA 138 137  K 4.0 4.1  CL 111 106  CO2 20* 22  GLUCOSE 103* 114*  BUN 17 17  CREATININE 1.35* 1.20  CALCIUM 8.2* 8.3*     Studies/Results:  Assessment/Plan: Left ureteral calculus: Agree with cefepime as patient is improving.  May best to continue 3rd or 4th generation cephalosporin by mouth for 10 days of total treatment upon discharge in absence of helpful culture data.  Will proceed with outpatient treatment of stone pending cardiology evaluation and resolution of current medical issues.    LOS: 4 days   [name] 08/08/2019, 4:31 PM

## 2019-08-08 NOTE — Progress Notes (Signed)
Progress Note  Patient Name: [name] Date of Encounter: 08/08/2019  Primary Cardiologist: Fransico Him, MD   Subjective   Echo results pending. Patient denies chest pain.   Inpatient Medications    Scheduled Meds: . Chlorhexidine Gluconate Cloth  6 each Topical Daily  . heparin injection (subcutaneous)  5,000 Units Subcutaneous Q8H  . mouth rinse  15 mL Mouth Rinse BID  . sodium chloride flush  5 mL Intracatheter Q8H   Continuous Infusions: . ceFEPime (MAXIPIME) IV Stopped (08/08/19 0618)  . vancomycin Stopped (08/07/19 1722)   PRN Meds: acetaminophen **OR** acetaminophen, morphine injection, ondansetron **OR** ondansetron (ZOFRAN) IV, traMADol   Vital Signs    Vitals:   08/07/19 2213 08/07/19 2343 08/08/19 0330 08/08/19 0400  BP: (!) 148/81   130/61  Pulse: 74   83  Resp: 20     Temp:  98.6 F (37 C) 98.2 F (36.8 C)   TempSrc:  Oral Oral   SpO2: 99%   94%  Weight:      Height:        Intake/Output Summary (Last 24 hours) at 08/08/2019 [id number] Last data filed at 08/08/2019 0600 Gross per 24 hour  Intake 378.73 ml  Output 2125 ml  Net -1746.27 ml   Last 3 Weights 08/04/2019 02/04/2019 02/04/2019  Weight (lbs) 265 lb 6.9 oz 284 lb 2.8 oz 282 lb 2 oz  Weight (kg) 120.4 kg 128.9 kg 127.971 kg      Telemetry    NSr, HR 70, rare PACs and PVCs - Personally Reviewed  ECG    No new - Personally Reviewed  Physical Exam   GEN: No acute distress.   Neck: No JVD Cardiac: RRR, no murmurs, rubs, or gallops.  Respiratory: Clear to auscultation bilaterally. GI: Soft, nontender, non-distended  MS: 1+ edema; No deformity. Neuro:  Nonfocal  Psych: Normal affect   Labs    High Sensitivity Troponin:   Recent Labs  Lab 08/06/19 1626 08/06/19 1923 08/06/19 2058 08/07/19 1251 08/07/19 1528  TROPONINIHS 589* 1,906* 1,707* 1,234* 1,141*      Chemistry Recent Labs  Lab 08/04/19 1702 08/04/19 1702 08/05/19 0515 08/05/19 0515 08/06/19 0454  08/07/19 0248 08/08/19 0246  NA 141   < > 140   < > 140 138 137  K 3.4*   < > 3.5   < > 3.7 4.0 4.1  CL 109   < > 108   < > 110 111 106  CO2 20*   < > 26   < > 21* 20* 22  GLUCOSE 198*   < > 148*   < > 110* 103* 114*  BUN 22   < > 20   < > 17 17 17   CREATININE 1.50*   < > 1.50*   < > 1.21 1.35* 1.20  CALCIUM 8.6*   < > 8.3*   < > 8.1* 8.2* 8.3*  PROT 6.8  --  5.8*  --   --  5.4*  --   ALBUMIN 3.3*  --  2.7*  --   --  2.4*  --   AST 16  --  13*  --   --  20  --   ALT 16  --  14  --   --  15  --   ALKPHOS 106  --  87  --   --  77  --   BILITOT 1.1  --  1.0  --   --  0.9  --  GFRNONAA 45*   < > 45*   < > 58* 51* 59*  GFRAA 52*   < > 52*   < > >60 59* >60  ANIONGAP 12   < > 6   < > 9 7 9    < > = values in this interval not displayed.     Hematology Recent Labs  Lab 08/06/19 1412 08/07/19 0248 08/08/19 0246  WBC 2.3* 14.5* 11.5*  RBC 5.05 4.43 4.59  HGB 14.3 12.6* 13.0  HCT 44.9 39.3 40.4  MCV 88.9 88.7 88.0  MCH 28.3 28.4 28.3  MCHC 31.8 32.1 32.2  RDW 14.2 14.4 14.2  PLT 221 216 244    BNPNo results for input(s): BNP, PROBNP in the last 168 hours.   DDimer No results for input(s): DDIMER in the last 168 hours.   Radiology    DG CHEST PORT 1 VIEW  Result Date: 08/06/2019 CLINICAL DATA:  Dyspnea today. EXAM: PORTABLE CHEST 1 VIEW COMPARISON:  Single-view of the chest 08/04/2019. FINDINGS: Lungs clear. Heart size is normal. No pneumothorax or pleural effusion. No acute or focal bony abnormality. IMPRESSION: No acute disease. Electronically Signed   By: Inge Rise M.D.   On: 08/06/2019 13:56   DG Abd Portable 1V  Result Date: 08/06/2019 CLINICAL DATA:  Tachypnea nephrostomy placement EXAM: PORTABLE ABDOMEN - 1 VIEW COMPARISON:  August 04, 2019 FINDINGS: Nephrostomy placed on the left. Moderate stool in colon. No bowel dilatation or air-fluid level to suggest bowel obstruction. No evident free air. IMPRESSION: Nephrostomy catheter on left. No bowel obstruction or free  air evident on supine examination. Electronically Signed   By: Lowella Grip III M.D.   On: 08/06/2019 13:54   IR NEPHROSTOMY PLACEMENT LEFT  Result Date: 08/06/2019 INDICATION: 74 year old male with an obstructing left ureteral calculus and resultant hydronephrosis. He presents for attempted nephrostomy tube placement. EXAM: IR NEPHROSTOMY PLACEMENT LEFT COMPARISON:  None. MEDICATIONS: 2 g Ancef; The antibiotic was administered in an appropriate time frame prior to skin puncture. ANESTHESIA/SEDATION: Fentanyl 100 mcg IV; Versed 2 mg IV Moderate Sedation Time:  10 minutes The patient was continuously monitored during the procedure by the interventional radiology nurse under my direct supervision. CONTRAST:  8 mL Omnipaque 300-administered into the collecting system(s) FLUOROSCOPY TIME:  Fluoroscopy Time: 0 minutes 42 seconds (46 mGy). COMPLICATIONS: None immediate. TECHNIQUE: The procedure, risks, benefits, and alternatives were explained to the patient. Questions regarding the procedure were encouraged and answered. The patient understands and consents to the procedure. The left flank was prepped with chlorhexidine in a sterile fashion, and a sterile drape was applied covering the operative field. A sterile gown and sterile gloves were used for the procedure. Local anesthesia was provided with 1% Lidocaine. The left flank was interrogated with ultrasound and the left kidney identified. The kidney is hydronephrotic. A suitable access site on the skin overlying the lower pole, posterior calix was identified. After local mg anesthesia was achieved, a small skin nick was made with an 11 blade scalpel. A 21 gauge Accustick needle was then advanced under direct sonographic guidance into the lower pole of the left kidney. A 0.018 inch wire was advanced under fluoroscopic guidance into the left renal collecting system. The Accustick sheath was then advanced over the wire and a 0.018 system exchanged for a 0.035  system. Gentle hand injection of contrast material confirms placement of the sheath within the renal collecting system. There is moderate hydronephrosis. The tract from the scan into the renal collecting  system was then dilated serially to 10-French. A 10-French Cook all-purpose drain was then placed and positioned under fluoroscopic guidance. The locking loop is well formed within the left renal pelvis. The catheter was secured to the skin with 2-0 Prolene and a sterile bandage was placed. Catheter was left to gravity bag drainage. IMPRESSION: Successful placement of a left 10 French percutaneous nephrostomy tube. Electronically Signed   By: Jacqulynn Cadet M.D.   On: 08/06/2019 13:56    Cardiac Studies   Echo results pending  Patient Profile     74 y.o. male with hx of chronic migraines, CKD stage 3, morbid obesity, and nephrolithiasis who is being seen for elevated troponin.   Assessment & Plan    Elevated troponin - HStrop 589>1906>1707>1234 - elevation in the setting of sepsis, hypertensive emergency, hypoxemia, AKI - 2D echo results pending - suspect demand ischemia - Patient has not had chest pain. EKG with out ischemic changes - Awaiting echo results for stress test vs cath - Will risk stratify with lipid ans A1C  Hypertensive emergency - BP with systolic over 123456 on admission. - pressures much better now. Does not appear appear to be on antihypertensives at the moment - BP today 130/61  AKI/urosepsis/hydronephrosis - creatinine baseline 1.5 - today level at 1.2  For questions or updates, please contact [place] Please consult [email or web address] for contact info under        Signed, Dianely Krehbiel Ninfa Meeker, PA-C  08/08/2019, 8:22 AM

## 2019-08-09 ENCOUNTER — Encounter (HOSPITAL_COMMUNITY)
Admission: EM | Disposition: A | Discharge status: Home / Self Care | Payer: Self-pay | Source: Home / Self Care | Attending: Family Medicine

## 2019-08-09 DIAGNOSIS — N200 Calculus of kidney: Secondary | ICD-10-CM | POA: Diagnosis not present

## 2019-08-09 DIAGNOSIS — I214 Non-ST elevation (NSTEMI) myocardial infarction: Secondary | ICD-10-CM

## 2019-08-09 DIAGNOSIS — N10 Acute tubulo-interstitial nephritis: Secondary | ICD-10-CM | POA: Diagnosis not present

## 2019-08-09 DIAGNOSIS — N201 Calculus of ureter: Secondary | ICD-10-CM | POA: Diagnosis not present

## 2019-08-09 DIAGNOSIS — I25110 Atherosclerotic heart disease of native coronary artery with unstable angina pectoris: Secondary | ICD-10-CM

## 2019-08-09 HISTORY — PX: LEFT HEART CATH AND CORONARY ANGIOGRAPHY: CATH118249

## 2019-08-09 LAB — CULTURE, BLOOD (ROUTINE X 2)
Culture: NO GROWTH
Culture: NO GROWTH
Special Requests: ADEQUATE

## 2019-08-09 LAB — BASIC METABOLIC PANEL
Anion gap: 13 (ref 5–15)
BUN: 18 mg/dL (ref 8–23)
CO2: 23 mmol/L (ref 22–32)
Calcium: 8.4 mg/dL — ABNORMAL LOW (ref 8.9–10.3)
Chloride: 102 mmol/L (ref 98–111)
Creatinine, Ser: 1.16 mg/dL (ref 0.61–1.24)
GFR calc Af Amer: >60 mL/min (ref >60)
GFR calc non Af Amer: >60 mL/min (ref >60)
Glucose, Bld: 114 mg/dL — ABNORMAL HIGH (ref 70–99)
Potassium: 3.4 mmol/L — ABNORMAL LOW (ref 3.5–5.1)
Sodium: 138 mmol/L (ref 135–145)

## 2019-08-09 LAB — LIPID PANEL
Cholesterol: 135 mg/dL (ref 0–200)
HDL: 15 mg/dL — ABNORMAL LOW (ref >40)
LDL Cholesterol: 73 mg/dL (ref 0–99)
Total CHOL/HDL Ratio: 9 RATIO
Triglycerides: 234 mg/dL — ABNORMAL HIGH (ref <150)
VLDL: 47 mg/dL — ABNORMAL HIGH (ref 0–40)

## 2019-08-09 LAB — CBC
HCT: 41.1 % (ref 39.0–52.0)
HCT: 44.8 % (ref 39.0–52.0)
Hemoglobin: 13.6 g/dL (ref 13.0–17.0)
Hemoglobin: 14.7 g/dL (ref 13.0–17.0)
MCH: 28.9 pg (ref 26.0–34.0)
MCH: 27.8 pg (ref 26.0–34.0)
MCHC: 33.1 g/dL (ref 30.0–36.0)
MCHC: 32.8 g/dL (ref 30.0–36.0)
MCV: 87.4 fL (ref 80.0–100.0)
MCV: 84.7 fL (ref 80.0–100.0)
Platelets: 246 10*3/uL (ref 150–400)
Platelets: 297 10*3/uL (ref 150–400)
RBC: 4.7 MIL/uL (ref 4.22–5.81)
RBC: 5.29 MIL/uL (ref 4.22–5.81)
RDW: 14 % (ref 11.5–15.5)
RDW: 13.9 % (ref 11.5–15.5)
WBC: 9.1 10*3/uL (ref 4.0–10.5)
WBC: 10.1 10*3/uL (ref 4.0–10.5)
nRBC: 0 % (ref 0.0–0.2)
nRBC: 0 % (ref 0.0–0.2)

## 2019-08-09 LAB — CREATININE, SERUM
Creatinine, Ser: 1.18 mg/dL (ref 0.61–1.24)
GFR calc Af Amer: >60 mL/min (ref >60)
GFR calc non Af Amer: >60 mL/min (ref >60)

## 2019-08-09 LAB — HEMOGLOBIN A1C
Hgb A1c MFr Bld: 5.8 % — ABNORMAL HIGH (ref 4.8–5.6)
Mean Plasma Glucose: 120 mg/dL

## 2019-08-09 SURGERY — LEFT HEART CATH AND CORONARY ANGIOGRAPHY
Anesthesia: LOCAL

## 2019-08-09 MED ORDER — HEPARIN (PORCINE) IN NACL 1000-0.9 UT/500ML-% IV SOLN
INTRAVENOUS | Status: AC
  Filled 2019-08-09: qty 500

## 2019-08-09 MED ORDER — HEPARIN (PORCINE) IN NACL 1000-0.9 UT/500ML-% IV SOLN
INTRAVENOUS | Status: DC | PRN
  Administered 2019-08-09 (×2): 500 mL

## 2019-08-09 MED ORDER — FENTANYL CITRATE (PF) 100 MCG/2ML IJ SOLN
INTRAMUSCULAR | Status: DC | PRN
  Administered 2019-08-09: 25 ug via INTRAVENOUS

## 2019-08-09 MED ORDER — HEPARIN SODIUM (PORCINE) 5000 UNIT/ML IJ SOLN
5000.0000 [IU] | Freq: Three times a day (TID) | INTRAMUSCULAR | Status: DC
  Administered 2019-08-09 – 2019-08-11 (×7): 5000 [IU] via SUBCUTANEOUS
  Filled 2019-08-09 (×7): qty 1

## 2019-08-09 MED ORDER — METOPROLOL TARTRATE 25 MG PO TABS
25.0000 mg | ORAL_TABLET | Freq: Two times a day (BID) | ORAL | Status: DC
  Administered 2019-08-09 (×2): 25 mg via ORAL
  Filled 2019-08-09 (×2): qty 1

## 2019-08-09 MED ORDER — SODIUM CHLORIDE 0.9% FLUSH: 3.0000 mL | INTRAVENOUS | Status: DC | PRN

## 2019-08-09 MED ORDER — POTASSIUM CHLORIDE CRYS ER 20 MEQ PO TBCR
40.0000 meq | EXTENDED_RELEASE_TABLET | Freq: Once | ORAL | Status: AC
  Administered 2019-08-09: 40 meq via ORAL
  Filled 2019-08-09: qty 2

## 2019-08-09 MED ORDER — SODIUM CHLORIDE 0.9 % IV SOLN: 250.0000 mL | INTRAVENOUS | Status: DC | PRN

## 2019-08-09 MED ORDER — VERAPAMIL HCL 2.5 MG/ML IV SOLN
INTRAVENOUS | Status: DC | PRN
  Administered 2019-08-09: 10 mL via INTRA_ARTERIAL

## 2019-08-09 MED ORDER — SODIUM CHLORIDE 0.9% FLUSH
3.0000 mL | Freq: Two times a day (BID) | INTRAVENOUS | Status: DC
  Administered 2019-08-09 – 2019-08-11 (×3): 3 mL via INTRAVENOUS

## 2019-08-09 MED ORDER — MIDAZOLAM HCL 2 MG/2ML IJ SOLN
INTRAMUSCULAR | Status: DC | PRN
  Administered 2019-08-09: 2 mg via INTRAVENOUS

## 2019-08-09 MED ORDER — LIDOCAINE HCL (PF) 1 % IJ SOLN
INTRAMUSCULAR | Status: AC
  Filled 2019-08-09: qty 30

## 2019-08-09 MED ORDER — HEPARIN SODIUM (PORCINE) 1000 UNIT/ML IJ SOLN
INTRAMUSCULAR | Status: AC
  Filled 2019-08-09: qty 1

## 2019-08-09 MED ORDER — LIDOCAINE HCL (PF) 1 % IJ SOLN
INTRAMUSCULAR | Status: DC | PRN
  Administered 2019-08-09: 4 mL

## 2019-08-09 MED ORDER — LABETALOL HCL 5 MG/ML IV SOLN: 10.0000 mg | INTRAVENOUS | Status: AC | PRN

## 2019-08-09 MED ORDER — FENTANYL CITRATE (PF) 100 MCG/2ML IJ SOLN
INTRAMUSCULAR | Status: AC
  Filled 2019-08-09: qty 2

## 2019-08-09 MED ORDER — ONDANSETRON HCL 4 MG/2ML IJ SOLN: 4.0000 mg | Freq: Four times a day (QID) | INTRAMUSCULAR | Status: DC | PRN

## 2019-08-09 MED ORDER — HYDRALAZINE HCL 20 MG/ML IJ SOLN: 10.0000 mg | INTRAMUSCULAR | Status: AC | PRN

## 2019-08-09 MED ORDER — VERAPAMIL HCL 2.5 MG/ML IV SOLN
INTRAVENOUS | Status: AC
  Filled 2019-08-09: qty 2

## 2019-08-09 MED ORDER — HEPARIN SODIUM (PORCINE) 1000 UNIT/ML IJ SOLN
INTRAMUSCULAR | Status: DC | PRN
  Administered 2019-08-09: 6000 [IU] via INTRAVENOUS

## 2019-08-09 MED ORDER — SODIUM CHLORIDE 0.9 % IV SOLN: INTRAVENOUS | Status: AC

## 2019-08-09 MED ORDER — ACETAMINOPHEN 325 MG PO TABS
650.0000 mg | ORAL_TABLET | ORAL | Status: DC | PRN
  Administered 2019-08-10 – 2019-08-12 (×4): 650 mg via ORAL
  Filled 2019-08-09 (×5): qty 2

## 2019-08-09 MED ORDER — IOHEXOL 350 MG/ML SOLN
INTRAVENOUS | Status: DC | PRN
  Administered 2019-08-09: 65 mL

## 2019-08-09 MED ORDER — MIDAZOLAM HCL 2 MG/2ML IJ SOLN
INTRAMUSCULAR | Status: AC
  Filled 2019-08-09: qty 2

## 2019-08-09 SURGICAL SUPPLY — 10 items
CATH 5FR JL3.5 JR4 ANG PIG MP (CATHETERS) ×1 IMPLANT
DEVICE RAD COMP TR BAND LRG (VASCULAR PRODUCTS) ×1 IMPLANT
GLIDESHEATH SLEND SS 6F .021 (SHEATH) ×1 IMPLANT
GUIDEWIRE INQWIRE 1.5J.035X260 (WIRE) IMPLANT
INQWIRE 1.5J .035X260CM (WIRE) ×2
KIT HEART LEFT (KITS) ×2 IMPLANT
PACK CARDIAC CATHETERIZATION (CUSTOM PROCEDURE TRAY) ×2 IMPLANT
SYR MEDRAD MARK 7 150ML (SYRINGE) ×2 IMPLANT
TRANSDUCER W/STOPCOCK (MISCELLANEOUS) ×2 IMPLANT
TUBING CIL FLEX 10 FLL-RA (TUBING) ×2 IMPLANT

## 2019-08-09 NOTE — Consult Note (Signed)
[address]       [place] [address]             [phone]        [name] [facility] Medical Record [id number] Date of Birth: Aug 30, 1945  Referring: No ref. provider found Primary Care: [name], MD Primary Cardiologist:Traci Radford Pax, MD  Chief Complaint:    Chief Complaint  Patient presents with  . Fever  . Urinary Tract Infection  Patient examined, images of coronary angiogram and echocardiogram and chest x-ray personally reviewed.  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  The patient was admitted to the hospital 5 days ago with left ureteral stone and obstruction.  Attempted percutaneous stent and nephrostomy were unsuccessful and the patient developed urosepsis and was placed in the ICU.  He cardiac enzymes which were positive and cardiology consultation recommended echocardiogram and then cardiac cath.  Patient has high-grade 95% stenosis of the proximal RCA and circumflex.  The LAD is small atretic and chronically occluded.  LVEDP is 24 and echocardiogram shows EF 45%.  The patient currently has an indwelling nephrostomy tube draining the left kidney.  He will  need more urologic surgery by Dr. [name].  His creatinine was 1.4.  Patient is a poor candidate for CABG because of his noncardiac comorbid problems including recent ICU stay for urosepsis with weakness and poor appetite and poor mobility.  A nephrostomy tube is in place which was difficult to successfully insert.  The LAD is nongraftable vessel.  CABG would be able to graft the RCA and circumflex however these lesions are proximal and short segment and would also be treatable with PCI which would be a better option for this patient with his other medical noncardiac problems.  I explained this situation with the patient he understands.   Current Activity/  Functional Status: Poor mobility after shattering his left foot several years ago.   Zubrod Score: At the time of surgery this patient's most appropriate activity status/level should be described as: []     0    Normal activity, no symptoms []     1    Restricted in physical strenuous activity but ambulatory, able to do out light work []     2    Ambulatory and capable of self care, unable to do work activities, up and about                 more than 50%  Of the time                            [x]     3    Only limited self care, in bed greater than 50% of waking hours []     4    Completely disabled, no self care, confined to bed or chair []     5    Moribund  Past Medical History:  Diagnosis Date  . Chronic kidney disease    RENAL STONES  . High frequency hearing loss   . History of migraine headaches   . Hx of colonic polyps   . Obesity   . Seborrheic dermatitis   . Vitiligo     Past Surgical History:  Procedure Laterality Date  . COLON SURGERY  01/2001  . CYSTOSCOPY/URETEROSCOPY/HOLMIUM LASER/STENT PLACEMENT Left 02/04/2019   Procedure: CYSTOSCOPY Left retrograde and  attempted left ureteroscopy;  Surgeon: Raynelle Bring, MD;  Location: [facility];  Service: Urology;  Laterality: Left;  ONLY NEEDS 60 MIN  . FRACTURE SURGERY    . IR NEPHROSTOMY PLACEMENT LEFT  02/05/2019  . IR NEPHROSTOMY PLACEMENT LEFT  08/06/2019  . TONSILLECTOMY AND ADENOIDECTOMY      Social History   Tobacco Use  Smoking Status Former Smoker  . Packs/day: 1.00  . Years: 40.00  . Pack years: 40.00  . Types: Cigarettes  . Quit date: 97  . Years since quitting: 24.2  Smokeless Tobacco Never Used    Social History   Substance and Sexual Activity  Alcohol Use Not Currently     Allergies  Allergen Reactions  . Codeine     Bad Headache    Current Facility-Administered Medications  Medication Dose Route Frequency Provider Last Rate Last Admin  . 0.9 %  sodium chloride infusion   Intravenous Continuous  Jettie Booze, MD 75 mL/hr at 08/09/19 1439 New Bag at 08/09/19 1439  . acetaminophen (TYLENOL) tablet 650 mg  650 mg Oral Q6H PRN Rai, Ripudeep K, MD   650 mg at 08/07/19 1143   Or  . acetaminophen (TYLENOL) suppository 650 mg  650 mg Rectal Q6H PRN Rai, Ripudeep K, MD      . aspirin EC tablet 81 mg  81 mg Oral Daily Rai, Ripudeep K, MD   81 mg at 08/08/19 1041  . ceFEPIme (MAXIPIME) 2 g in sodium chloride 0.9 % 100 mL IVPB  2 g Intravenous Q8H Rai, Ripudeep K, MD 200 mL/hr at 08/09/19 0536 2 g at 08/09/19 0536  . Chlorhexidine Gluconate Cloth 2 % PADS 6 each  6 each Topical Daily Rai, Ripudeep K, MD   6 each at 08/09/19 0955  . heparin injection 5,000 Units  5,000 Units Subcutaneous Q8H Rai, Ripudeep K, MD   5,000 Units at 08/09/19 0533  . MEDLINE mouth rinse  15 mL Mouth Rinse BID Rai, Ripudeep K, MD   15 mL at 08/08/19 2219  . metoprolol tartrate (LOPRESSOR) tablet 25 mg  25 mg Oral BID Sueanne Margarita, MD   25 mg at 08/09/19 0956  . morphine 2 MG/ML injection 2 mg  2 mg Intravenous Q4H PRN Rai, Ripudeep K, MD   2 mg at 08/08/19 0107  . ondansetron (ZOFRAN) tablet 4 mg  4 mg Oral Q6H PRN Rai, Ripudeep K, MD       Or  . ondansetron (ZOFRAN) injection 4 mg  4 mg Intravenous Q6H PRN Rai, Ripudeep K, MD      . ondansetron (ZOFRAN) injection 4 mg  4 mg Intravenous Q6H PRN Larae Grooms S, MD      . sodium chloride flush (NS) 0.9 % injection 3 mL  3 mL Intravenous Q12H Furth, Cadence H, PA-C   3 mL at 08/08/19 2219  . sodium chloride flush (NS) 0.9 % injection 5 mL  5 mL Intracatheter Q8H Rai, Ripudeep K, MD   5 mL at 08/09/19 0539  . traMADol (ULTRAM) tablet 50 mg  50 mg Oral Q6H PRN Rai, Ripudeep K, MD        Medications Prior to Admission  Medication Sig Dispense Refill Last Dose  . cefdinir (OMNICEF) 300 MG capsule Take 1 capsule (300 mg total) by mouth 2 (two) times daily. (Patient not taking: Reported on 08/04/2019) 14 capsule 0 Not Taking at Unknown time  .  oxyCODONE-acetaminophen (PERCOCET) 5-325 MG tablet Take 1 tablet by mouth every  4 (four) hours as needed for severe pain. (Patient not taking: Reported on 08/04/2019) 20 tablet 0 Not Taking at Unknown time    History reviewed. No pertinent family history.   Review of Systems:   ROS Nondiabetic No history of thoracic injuries Nephrostomy tube exiting his left flank    Cardiac Review of Systems: Y or  [    ]= no  Chest Pain [    ]  Resting SOB [   ] Exertional SOB  [  ]  Orthopnea [  ]   Pedal Edema [   ]    Palpitations [  ] Syncope  [  ]   Presyncope [   ]  General Review of Systems: [Y] = yes [  ]=no Constitional: recent weight change Blue.Reese  ]; anorexia Blue.Reese  ]; fatigue [ y ]; nausea [  ]; night sweats Blue.Reese  ]; fever [  ]; or chills [ y ]                                                               Dental: Last Dentist visit: 1 year  Eye : blurred vision [  ]; diplopia [   ]; vision changes [  ];  Amaurosis fugax[  ]; Resp: cough [  ];  wheezing[  ];  hemoptysis[  ]; shortness of breath[  ]; paroxysmal nocturnal dyspnea[  ]; dyspnea on exertion[  ]; or orthopnea[  ];  GI:  gallstones[  ], vomiting[  ];  dysphagia[  ]; melena[  ];  hematochezia [  ]; heartburn[  ];   Hx of  Colonoscopy[  ]; GU: kidney stones [ y ]; hematuria[ y ];   dysuria [  ];  nocturia[  ];  history of     obstruction [  ]; urinary frequency [  ]             Skin: rash, swelling[  ];, hair loss[  ];  peripheral edema[  ];  or itching[  ]; Musculosketetal: myalgias[  ];  joint swelling[  ];  joint erythema[  ];  joint pain[  ];  back pain[  ];  Heme/Lymph: bruising[  ];  bleeding[  ];  anemia[  ];  Neuro: TIA[  ];  headaches[  ];  stroke[  ];  vertigo[  ];  seizures[  ];   paresthesias[  ];  difficulty walking[  ];  Psych:depression[  ]; anxiety[  ];  Endocrine: diabetes[  ];  thyroid dysfunction[  ];       Physical Exam: BP 133/63 (BP Location: Left Arm)   Pulse 65   Temp 98.9 F (37.2 C) (Oral)   Resp 19   Ht 5\' 8"   (1.727 m)   Wt 120 kg   SpO2 94%   BMI 40.22 kg/m         Exam    General- alert and comfortable obese elderly male no acute distress    Neck- no JVD, no cervical adenopathy palpable, no carotid bruit   Lungs- clear without rales, wheezes   Cor- regular rate and rhythm, no murmur , gallop   Abdomen- soft, non-tender.  Left nephrostomy tube draining turbid urine   Extremities - warm, non-tender, minimal edema   Neuro- oriented, appropriate,  no focal weakness   Diagnostic Studies & Laboratory data:     Recent Radiology Findings:   CARDIAC CATHETERIZATION  Addendum Date: 08/09/2019    Ramus lesion is 75% stenosed.  Mid LAD-1 lesion is 80% stenosed.  Mid LAD-2 lesion is 99% stenosed.  Prox RCA lesion is 90% stenosed.  Mid RCA lesion is 25% stenosed.  Prox Cx lesion is 80% stenosed.  LV end diastolic pressure is moderately elevated.  There is no aortic valve stenosis.  Severe three vessel disease.  Will get cardiac surgery opinion.  Diffuse distal vessel disease as well.  His RCA and circumflex are treatable percutaneously.  LAD appears to be a chronic occlusion and may not have a good target for LIMA graft.  He has significant other medical issues as well.  Discussed with Dr. [name].  If he is not a candidate for CABG and if PCI done, urology could wait 30 days post procedure to perform urologic surgery.   Result Date: 08/09/2019  Ramus lesion is 75% stenosed.  Mid LAD-1 lesion is 80% stenosed.  Mid LAD-2 lesion is 99% stenosed.  Prox RCA lesion is 90% stenosed.  Mid RCA lesion is 25% stenosed.  Prox Cx lesion is 80% stenosed.  LV end diastolic pressure is moderately elevated.  There is no aortic valve stenosis.  Severe three vessel disease.  Will get cardiac surgery opinion.  Diffuse distal vessel disease as well.  His RCA and circumflex are treatable percutaneously.  LAD appears to be a chronic occlusion and may not have a good target for LIMA graft.  He has significant other  medical issues as well.  Discussed with Dr. [name].  If PCI done, they could wait 30 days post procedure to perform urologic surgery.     I have independently reviewed the above radiologic studies and discussed with the patient   Recent Lab Findings: Lab Results  Component Value Date   WBC 9.1 08/09/2019   HGB 13.6 08/09/2019   HCT 41.1 08/09/2019   PLT 246 08/09/2019   GLUCOSE 114 (H) 08/09/2019   CHOL 135 08/09/2019   TRIG 234 (H) 08/09/2019   HDL 15 (L) 08/09/2019   LDLCALC 73 08/09/2019   ALT 15 08/07/2019   AST 20 08/07/2019   NA 138 08/09/2019   K 3.4 (L) 08/09/2019   CL 102 08/09/2019   CREATININE 1.16 08/09/2019   BUN 18 08/09/2019   CO2 23 08/09/2019   INR 1.1 08/05/2019   HGBA1C 5.8 (H) 08/08/2019      Assessment / Plan:   Patient was recently diagnosed three-vessel CAD with moderate LV dysfunction in the setting of positive enzymes with urosepsis in the ICU.  For the reasons stated above I would recommend PCI as best therapy for his multivessel coronary disease due to his comorbid noncardiac disease.  Dahlia Byes MD     @ME1 @ 08/09/2019 4:53 PM

## 2019-08-09 NOTE — Progress Notes (Signed)
[facility]                                                                              Patient Demographics  [name], is a 74 y.o. male, DOB - 1945/08/11, [id number]  Admit date - 08/04/2019   Admitting Physician Elwyn Reach, MD  Outpatient Primary MD for the patient is [name], MD  Outpatient specialists:   LOS - 5  days   Medical records reviewed and are as summarized below:    Chief Complaint  Patient presents with  . Fever  . Urinary Tract Infection       Brief summary   Patient is a 74 year old male with history of renal stones, migraine headaches, morbid obesity, presented to ED with left flank pain, weakness and fever.  Patient had been dealing with left-sided kidney stone for which urology had been trying to dislodge, however too large to pass on its own and was unable to get stented due to enlarged prostate.  Unable to get nephrostomy tube due to body habitus.  Patient presented with left flank pain, urology was consulted.  CT showed 5 mm obstructing calculus of the proximal left ureter with hydronephrosis, additional nonobstructing renal calculi.  UA positive for UTI  Assessment & Plan    Principal Problem: Severe sepsis with infected obstructing renal calculus, left, hydronephrosis, UTI -Urology and interventional radiology was consulted, patient underwent left-sided nephrostomy tube placement -Post procedure, on 4/6 patient was noted to have significant tachypnea, rigors, hypertensive emergency, BP 225/193, tachycardia 126, elevated procalcitonin 71.6, lactic acidosis, elevated troponin, leukocytosis.  Patient was noted to be hypoxemic, and mottled, received IV fluids, morphine, subsequently improved.  Transfered to SDU and placed on broad-spectrum IV antibiotics. -Procalcitonin trending down, 49.36 < --71.6 -Blood cultures, urine cultures negative so far, however patient was on antibiotics when the cultures were  obtained.  UA was positive on 4/4 for UTI -MRSA PCR negative, will DC vancomycin, continue IV cefepime   Active Problems:  Acute hypoxemic respiratory failure -Likely decompensated in the setting of sepsis, #1 -Chest x-ray showed no acute infiltrates or pneumothorax -Stable, currently on room air  Elevated troponin in the setting of severe sepsis/severe three-vessel disease -Troponin positive 589 on 4/6--> 1906--> 1707-> 1234-> 1141 -Currently no chest pain tachycardia no acute ST-T wave changes -Cardiology consulted.  2D echo showed EF of 45 to 50% with global hypokinesis, grade 1 diastolic dysfunction -Underwent cardiac cath today, severe three-vessel CAD, diffuse distal vessel disease, cardiology recommended continue care at [facility] and will get cardiac surgery opinion  Acute combined systolic and diastolic CHF -2+ pitting edema, I/O's  with 4.6 L positive, likely from fluid overload -Received IV Lasix on 4/8, I's and O's improved to 790 cc+     AKI (acute kidney injury) ([place]) Baseline creatinine unknown, creatinine 1.5 on admission, in the setting of infected obstructing left renal stone and sepsis -Creatinine 1.16, improving    Hypokalemia Replaced  Morbid obesity Estimated body mass index is 40.22 kg/m as calculated from the following:   Height as of this encounter: 5\' 8"  (1.727 m).   Weight as of this  encounter: 120 kg.  Code Status: Full code DVT Prophylaxis: Heparin subcu Family Communication: Discussed all imaging results, lab results, explained to the patient. Called patient's daughter x2 to update regarding cardiac cath and transfer, unable to make contact.   Disposition Plan: Patient transferred to [facility] today for cardiac cath, which showed severe three-vessel CAD.  Cardiology requesting continue care at [facility] with cardiothoracic surgery opinion. Signout given to [facility] attending physician, Dr. Raelyn Mora  Time Spent in minutes 25 minutes  Procedures:    08/06/2019 placement of a LEFT 42F percutaneous nephrostomy tube. *  Consultants:   Urology, interventional radiology  Antimicrobials:   Anti-infectives (From admission, onward)   Start     Dose/Rate Route Frequency Ordered Stop   08/07/19 1500  vancomycin (VANCOREADY) IVPB 1250 mg/250 mL  Status:  Discontinued     1,250 mg 166.7 mL/hr over 90 Minutes Intravenous Every 12 hours 08/06/19 1407 08/06/19 1413   08/07/19 1500  vancomycin (VANCOREADY) IVPB 1250 mg/250 mL  Status:  Discontinued     1,250 mg 166.7 mL/hr over 90 Minutes Intravenous Every 24 hours 08/06/19 1413 08/08/19 1353   08/06/19 2200  [MAR Hold]  ceFEPIme (MAXIPIME) 2 g in sodium chloride 0.9 % 100 mL IVPB     (MAR Hold since Fri 08/09/2019 at 1233.Hold Reason: Transfer to a Procedural area.)   2 g 200 mL/hr over 30 Minutes Intravenous Every 8 hours 08/06/19 1407     08/06/19 1500  vancomycin (VANCOCIN) 2,500 mg in sodium chloride 0.9 % 500 mL IVPB     2,500 mg 250 mL/hr over 120 Minutes Intravenous  Once 08/06/19 1407 08/06/19 1850   08/06/19 1415  ceFEPIme (MAXIPIME) 2 g in sodium chloride 0.9 % 100 mL IVPB     2 g 200 mL/hr over 30 Minutes Intravenous  Once 08/06/19 1407 08/06/19 1554   08/06/19 1140  ceFAZolin (ANCEF) 2-4 GM/100ML-% IVPB    Note to Pharmacy: Lesia Hausen   : cabinet override      08/06/19 1140 08/06/19 1152   08/05/19 1800  cefTRIAXone (ROCEPHIN) 1 g in sodium chloride 0.9 % 100 mL IVPB  Status:  Discontinued     1 g 200 mL/hr over 30 Minutes Intravenous Every 24 hours 08/04/19 2236 08/06/19 1408   08/04/19 1830  cefTRIAXone (ROCEPHIN) 2 g in sodium chloride 0.9 % 100 mL IVPB     2 g 200 mL/hr over 30 Minutes Intravenous  Once 08/04/19 1807 08/04/19 1930         Medications  Scheduled Meds: . [MAR Hold] aspirin EC  81 mg Oral Daily  . [MAR Hold] Chlorhexidine Gluconate Cloth  6 each Topical Daily  . [MAR Hold] heparin injection (subcutaneous)  5,000 Units Subcutaneous Q8H  . [MAR Hold]  mouth rinse  15 mL Mouth Rinse BID  . [MAR Hold] metoprolol tartrate  25 mg Oral BID  . [MAR Hold] sodium chloride flush  3 mL Intravenous Q12H  . [MAR Hold] sodium chloride flush  5 mL Intracatheter Q8H   Continuous Infusions: . sodium chloride    . sodium chloride 75 mL/hr at 08/09/19 1439  . sodium chloride 1 mL/kg/hr (08/09/19 1102)  . [MAR Hold] ceFEPime (MAXIPIME) IV 2 g (08/09/19 0536)   PRN Meds:.sodium chloride, [MAR Hold] acetaminophen **OR** [MAR Hold] acetaminophen, [MAR Hold]  morphine injection, [MAR Hold] ondansetron **OR** [MAR Hold] ondansetron (ZOFRAN) IV, ondansetron (ZOFRAN) IV, sodium chloride flush, [MAR Hold] traMADol      Subjective:   [name] was seen and examined today.  Seen prior to the cardiac cath, denied any chest pain or acute shortness of breath.  Left flank pain stable.  Peripheral edema improving.    Patient denies dizziness, N/V/D/C, new weakness, numbess, tingling. No acute events overnight.    Objective:   Vitals:   08/09/19 1408 08/09/19 1413 08/09/19 1418 08/09/19 1430  BP: 138/75 138/90 138/88   Pulse: 63 75 75 65  Resp: (!) 24 (!) 24 16 19   Temp:      TempSrc:      SpO2: 97% 97% 94% 94%  Weight:      Height:        Intake/Output Summary (Last 24 hours) at 08/09/2019 1458 Last data filed at 08/09/2019 1105 Gross per 24 hour  Intake 440 ml  Output 4300 ml  Net -3860 ml     Wt Readings from Last 3 Encounters:  08/09/19 120 kg  02/04/19 128.9 kg  01/31/19 128 kg     Physical Exam  General: Alert and oriented x 3, NAD  Cardiovascular: S1 S2 clear, RRR.  1+ pedal edema b/l  Respiratory: CTAB, no wheezing, rales or rhonchi  Gastrointestinal: Soft, mild left CVAT, nondistended, NBS  Ext: 1+ pedal edema bilaterally  Neuro: no new deficits  Musculoskeletal: No cyanosis, clubbing  Skin: No rashes  Psych: Normal affect and demeanor, alert and oriented x3     Data Reviewed:  I have personally reviewed  following labs and imaging studies  Micro Results Recent Results (from the past 240 hour(s))  Culture, blood (routine x 2)     Status: None   Collection Time: 08/04/19  5:10 PM   Specimen: BLOOD LEFT FOREARM  Result Value Ref Range Status   Specimen Description   Final    BLOOD LEFT FOREARM Performed at [facility] [address] [place], [place] [address]    Special Requests   Final    BOTTLES DRAWN AEROBIC AND ANAEROBIC Blood Culture results may not be optimal due to an excessive volume of blood received in culture bottles Performed at [facility] [address] [place], [place] [address]    Culture   Final    NO GROWTH 5 DAYS Performed at [facility], [place] [address] [place], [place] [address]    Report Status 08/09/2019 FINAL  Final  Culture, blood (routine x 2)     Status: None   Collection Time: 08/04/19  5:15 PM   Specimen: BLOOD  Result Value Ref Range Status   Specimen Description   Final    BLOOD BLOOD RIGHT FOREARM Performed at [facility] [address] [place], [place] [address]    Special Requests   Final    BOTTLES DRAWN AEROBIC AND ANAEROBIC Blood Culture adequate volume Performed at [facility] [address], [place], [place] [address]    Culture   Final    NO GROWTH 5 DAYS Performed at [facility], [place] [address] [place], [place] [address]    Report Status 08/09/2019 FINAL  Final  Respiratory Panel by RT PCR (Flu A&B, Covid) - Nasopharyngeal Swab     Status: None   Collection Time: 08/04/19  7:21 PM   Specimen: Nasopharyngeal Swab  Result Value Ref Range Status   SARS Coronavirus 2 by RT PCR NEGATIVE NEGATIVE Final    Comment: (NOTE) SARS-CoV-2 target nucleic acids are NOT DETECTED. The SARS-CoV-2 RNA is generally detectable in  upper respiratoy specimens during the acute phase of infection. The lowest concentration of SARS-CoV-2 viral copies this  assay can detect is 131 copies/mL. A negative result does not preclude SARS-Cov-2 infection and should not be used as the sole basis for treatment or other patient management decisions. A negative result may occur with  improper specimen collection/handling, submission of specimen other than nasopharyngeal swab, presence of viral mutation(s) within the areas targeted by this assay, and inadequate number of viral copies (<131 copies/mL). A negative result must be combined with clinical observations, patient history, and epidemiological information. The expected result is Negative. Fact Sheet for Patients:  PinkCheek.be Fact Sheet for Healthcare Providers:  GravelBags.it This test is not yet ap proved or cleared by the [place] FDA and  has been authorized for detection and/or diagnosis of SARS-CoV-2 by FDA under an Emergency Use Authorization (EUA). This EUA will remain  in effect (meaning this test can be used) for the duration of the COVID-19 declaration under Section 564(b)(1) of the Act, 21 U.S.C. section 360bbb-3(b)(1), unless the authorization is terminated or revoked sooner.    Influenza A by PCR NEGATIVE NEGATIVE Final   Influenza B by PCR NEGATIVE NEGATIVE Final    Comment: (NOTE) The Xpert Xpress SARS-CoV-2/FLU/RSV assay is intended as an aid in  the diagnosis of influenza from Nasopharyngeal swab specimens and  should not be used as a sole basis for treatment. Nasal washings and  aspirates are unacceptable for Xpert Xpress SARS-CoV-2/FLU/RSV  testing. Fact Sheet for Patients: PinkCheek.be Fact Sheet for Healthcare Providers: GravelBags.it This test is not yet approved or cleared by the [place] FDA and  has been authorized for detection and/or diagnosis of SARS-CoV-2 by  FDA under an Emergency Use Authorization (EUA). This EUA will remain  in  effect (meaning this test can be used) for the duration of the  Covid-19 declaration under Section 564(b)(1) of the Act, 21  U.S.C. section 360bbb-3(b)(1), unless the authorization is  terminated or revoked. Performed at [facility], [place] [address] [place], [place] [address]   Culture, blood (routine x 2)     Status: None (Preliminary result)   Collection Time: 08/06/19  2:29 PM   Specimen: BLOOD  Result Value Ref Range Status   Specimen Description   Final    BLOOD RIGHT ANTECUBITAL Performed at [facility] [address] [place], [place] [address]    Special Requests   Final    BOTTLES DRAWN AEROBIC AND ANAEROBIC Blood Culture adequate volume Performed at [facility] [address], [place], [place] [address]    Culture   Final    NO GROWTH 3 DAYS Performed at [facility], [place] [address] [place], [place] [address]    Report Status PENDING  Incomplete  Culture, blood (routine x 2)     Status: None (Preliminary result)   Collection Time: 08/06/19  2:31 PM   Specimen: BLOOD  Result Value Ref Range Status   Specimen Description   Final    BLOOD RIGHT ANTECUBITAL Performed at [facility] [address], [place], [place] [address]    Special Requests   Final    BOTTLES DRAWN AEROBIC AND ANAEROBIC Blood Culture adequate volume Performed at [facility] [address], [place], [place] [address]    Culture   Final    NO GROWTH 3 DAYS Performed at [facility], [place] [address] [place], [place] [address]  Report Status PENDING  Incomplete  Culture, Urine     Status: None   Collection Time: 08/06/19  3:29 PM   Specimen: Urine, Random  Result Value Ref Range Status   Specimen Description   Final    URINE, RANDOM Performed at [facility] [address], [place], [place] [address]    Special Requests   Final    NONE Performed at [facility], [place] [address], [place], [place] [address]    Culture   Final    NO GROWTH Performed at [facility], [place] [address] [place], [place] [address]    Report Status 08/07/2019 FINAL  Final  MRSA PCR Screening     Status: None   Collection Time: 08/06/19  6:38 PM   Specimen: Nasal Mucosa; Nasopharyngeal  Result Value Ref Range Status   MRSA by PCR NEGATIVE NEGATIVE Final    Comment:        The GeneXpert MRSA Assay (FDA approved for NASAL specimens only), is one component of a comprehensive MRSA colonization surveillance program. It is not intended to diagnose MRSA infection nor to guide or monitor treatment for MRSA infections. Performed at [facility], [place] [address] [place], [place] [address]     Radiology Reports CARDIAC CATHETERIZATION  Addendum Date: 08/09/2019    Ramus lesion is 75% stenosed.  Mid LAD-1 lesion is 80% stenosed.  Mid LAD-2 lesion is 99% stenosed.  Prox RCA lesion is 90% stenosed.  Mid RCA lesion is 25% stenosed.  Prox Cx lesion is 80% stenosed.  LV end diastolic pressure is moderately elevated.  There is no aortic valve stenosis.  Severe three vessel disease.  Will get cardiac surgery opinion.  Diffuse distal vessel disease as well.  His RCA and circumflex are treatable percutaneously.  LAD appears to be a chronic occlusion and may not have a good target for LIMA graft.  He has significant other medical issues as well.  Discussed with Dr. [name].  If he is not a candidate for CABG and if PCI done, urology could wait 30 days post procedure to perform urologic surgery.   Result Date: 08/09/2019  Ramus lesion is 75% stenosed.  Mid LAD-1 lesion is 80% stenosed.  Mid LAD-2 lesion is 99% stenosed.  Prox RCA lesion is 90% stenosed.  Mid RCA lesion is 25% stenosed.  Prox Cx lesion is 80% stenosed.  LV end diastolic pressure is moderately elevated.  There is no aortic valve stenosis.  Severe three vessel  disease.  Will get cardiac surgery opinion.  Diffuse distal vessel disease as well.  His RCA and circumflex are treatable percutaneously.  LAD appears to be a chronic occlusion and may not have a good target for LIMA graft.  He has significant other medical issues as well.  Discussed with Dr. [name].  If PCI done, they could wait 30 days post procedure to perform urologic surgery.   DG CHEST PORT 1 VIEW  Result Date: 08/06/2019 CLINICAL DATA:  Dyspnea today. EXAM: PORTABLE CHEST 1 VIEW COMPARISON:  Single-view of the chest 08/04/2019. FINDINGS: Lungs clear. Heart size is normal. No pneumothorax or pleural effusion. No acute or focal bony abnormality. IMPRESSION: No acute disease. Electronically Signed   By: Inge Rise M.D.   On: 08/06/2019 13:56   DG Chest Portable 1 View  Result Date: 08/04/2019 CLINICAL DATA:  Fever EXAM: PORTABLE CHEST 1 VIEW COMPARISON:  06/09/2005 FINDINGS: Cardiomegaly. No confluent opacities, effusions or edema. No  acute bony abnormality. IMPRESSION: Cardiomegaly.  No active disease. Electronically Signed   By: Rolm Baptise M.D.   On: 08/04/2019 17:43   DG Abd Portable 1V  Result Date: 08/06/2019 CLINICAL DATA:  Tachypnea nephrostomy placement EXAM: PORTABLE ABDOMEN - 1 VIEW COMPARISON:  August 04, 2019 FINDINGS: Nephrostomy placed on the left. Moderate stool in colon. No bowel dilatation or air-fluid level to suggest bowel obstruction. No evident free air. IMPRESSION: Nephrostomy catheter on left. No bowel obstruction or free air evident on supine examination. Electronically Signed   By: Lowella Grip III M.D.   On: 08/06/2019 13:54   DG Abd Portable 1 View  Result Date: 08/04/2019 CLINICAL DATA:  Fever, renal stone EXAM: PORTABLE ABDOMEN - 1 VIEW COMPARISON:  CT 02/05/2019 FINDINGS: Study limited by body habitus. Calcification projects over the left abdomen, likely left renal stone or possibly the previously seen left renal pelvic stone. No definite calcifications over  the course of the ureters although small stones may be difficult to visualize. Nonobstructive bowel gas pattern. No organomegaly. IMPRESSION: Limited study due to body habitus. Calcification in the left abdomen could reflect the previously seen left renal or renal pelvic stone. Electronically Signed   By: Rolm Baptise M.D.   On: 08/04/2019 17:45   ECHOCARDIOGRAM COMPLETE  Result Date: 08/08/2019    ECHOCARDIOGRAM REPORT   Patient Name:   [name] Date of Exam: 08/07/2019 Medical Rec #:  [id number]         Height:       68.0 in Accession #:    [id number]        Weight:       265.4 lb Date of Birth:  1946/03/11         BSA:          2.305 m Patient Age:    24 years          BP:           122/74 mmHg Patient Gender: M                 HR:           71 bpm. Exam Location:  Inpatient Procedure: 2D Echo, Cardiac Doppler and Color Doppler Indications:     Elevated Troponin  History:         Patient has no prior history of Echocardiogram examinations.                  Morbid obesity; Risk Factors:Hypertension. AKI, sepsis, CKD,                  elevated Troponin.  Sonographer:     Dustin Flock Referring Phys:  [place] Diagnosing Phys: Lyman Bishop MD  Sonographer Comments: Patient is morbidly obese. IMPRESSIONS  1. Left ventricular ejection fraction, by estimation, is 45 to 50%. The left ventricle has low normal function. The left ventricle demonstrates global hypokinesis. There is mild left ventricular hypertrophy. Left ventricular diastolic parameters are consistent with Grade I diastolic dysfunction (impaired relaxation). There is incoordinate septal motion.  2. Right ventricular systolic function is low normal. The right ventricular size is normal. There is moderately elevated pulmonary artery systolic pressure. The estimated right ventricular systolic pressure is 99991111 mmHg.  3. The mitral valve is abnormal. Trivial mitral valve regurgitation.  4. The aortic valve is tricuspid. Aortic valve  regurgitation is not visualized.  5. The inferior vena cava is normal in size with greater than 50%  respiratory variability, suggesting right atrial pressure of 3 mmHg. FINDINGS  Left Ventricle: Left ventricular ejection fraction, by estimation, is 45 to 50%. The left ventricle has low normal function. The left ventricle demonstrates global hypokinesis. The left ventricular internal cavity size was normal in size. There is mild left ventricular hypertrophy. Incoordinate septal motion and abnormal (paradoxical) septal motion, consistent with left bundle branch block. Left ventricular diastolic parameters are consistent with Grade I diastolic dysfunction (impaired relaxation). Indeterminate filling pressures. Right Ventricle: The right ventricular size is normal. No increase in right ventricular wall thickness. Right ventricular systolic function is low normal. There is moderately elevated pulmonary artery systolic pressure. The tricuspid regurgitant velocity  is 3.63 m/s, and with an assumed right atrial pressure of 3 mmHg, the estimated right ventricular systolic pressure is 99991111 mmHg. Left Atrium: Left atrial size was normal in size. Right Atrium: Right atrial size was normal in size. Pericardium: There is no evidence of pericardial effusion. Mitral Valve: The mitral valve is abnormal. There is mild thickening of the mitral valve leaflet(s). Mild mitral annular calcification. Trivial mitral valve regurgitation. Tricuspid Valve: The tricuspid valve is grossly normal. Tricuspid valve regurgitation is mild. Aortic Valve: The aortic valve is tricuspid. Aortic valve regurgitation is not visualized. Pulmonic Valve: The pulmonic valve was normal in structure. Pulmonic valve regurgitation is not visualized. Aorta: The aortic root and ascending aorta are structurally normal, with no evidence of dilitation. Venous: The inferior vena cava is normal in size with greater than 50% respiratory variability, suggesting right atrial  pressure of 3 mmHg. IAS/Shunts: No atrial level shunt detected by color flow Doppler.  LEFT VENTRICLE PLAX 2D LVIDd:         5.00 cm  Diastology LVIDs:         3.70 cm  LV e' lateral:   9.57 cm/s LV PW:         1.20 cm  LV E/e' lateral: 6.1 LV IVS:        1.20 cm  LV e' medial:    7.18 cm/s LVOT diam:     2.60 cm  LV E/e' medial:  8.1 LV SV:         103 LV SV Index:   45 LVOT Area:     5.31 cm  RIGHT VENTRICLE RV Basal diam:  3.20 cm RV S prime:     10.30 cm/s TAPSE (M-mode): 2.4 cm LEFT ATRIUM             Index       RIGHT ATRIUM           Index LA diam:        4.10 cm 1.78 cm/m  RA Area:     18.50 cm LA Vol (A2C):   47.3 ml 20.52 ml/m RA Volume:   63.00 ml  27.33 ml/m LA Vol (A4C):   46.3 ml 20.09 ml/m LA Biplane Vol: 48.4 ml 21.00 ml/m  AORTIC VALVE LVOT Vmax:   93.30 cm/s LVOT Vmean:  57.700 cm/s LVOT VTI:    0.194 m  AORTA Ao Root diam: 3.10 cm MITRAL VALVE               TRICUSPID VALVE MV Area (PHT): 3.77 cm    TR Peak grad:   52.7 mmHg MV Decel Time: 201 msec    TR Vmax:        363.00 cm/s MV E velocity: 58.00 cm/s MV A velocity: 91.30 cm/s  SHUNTS MV E/A ratio:  0.64  Systemic VTI:  0.19 m                            Systemic Diam: 2.60 cm Lyman Bishop MD Electronically signed by Lyman Bishop MD Signature Date/Time: 08/07/2019/4:39:54 PM    Final (Updated)    CT Renal Stone Study  Result Date: 08/04/2019 CLINICAL DATA:  Gross hematuria, history of kidney stones EXAM: CT ABDOMEN AND PELVIS WITHOUT CONTRAST TECHNIQUE: Multidetector CT imaging of the abdomen and pelvis was performed following the standard protocol without IV contrast. COMPARISON:  02/05/2019 FINDINGS: Lower chest: No acute abnormality. Hepatobiliary: Hepatic steatosis. Cholelithiasis. No biliary dilatation. Pancreas: Unremarkable. Spleen: Unremarkable. Adrenals/Urinary Tract: Adrenals are unremarkable. Renal cortical thinning. Stable small right upper pole renal cyst. Bilateral nonobstructing renal calculi are again identified  measuring up to 3 mm. There is a 5 mm obstructing calculus within the proximal left ureter with resulting hydroureteronephrosis. Poorly distended bladder is unremarkable. Stomach/Bowel: Stomach is within normal limits. Bowel is normal in caliber. Small bowel loops are closely apposed to the ventral abdominal wall. Mild diverticulosis. Vascular/Lymphatic: Aortic atherosclerosis. No enlarged abdominal or pelvic lymph nodes. No adenopathy. Reproductive: Marked prostate enlargement. Other: No ascites. Small fat containing inguinal hernias. Tiny fat containing left parasagittal ventral hernia no longer contains a bowel loop. Musculoskeletal: No acute osseous finding. IMPRESSION: 5 mm obstructing calculus of the proximal left ureter with hydroureteronephrosis. Additional nonobstructing renal calculi. Additional stable findings as detailed above. Electronically Signed   By: Macy Mis M.D.   On: 08/04/2019 19:21   IR NEPHROSTOMY PLACEMENT LEFT  Result Date: 08/06/2019 INDICATION: 74 year old male with an obstructing left ureteral calculus and resultant hydronephrosis. He presents for attempted nephrostomy tube placement. EXAM: IR NEPHROSTOMY PLACEMENT LEFT COMPARISON:  None. MEDICATIONS: 2 g Ancef; The antibiotic was administered in an appropriate time frame prior to skin puncture. ANESTHESIA/SEDATION: Fentanyl 100 mcg IV; Versed 2 mg IV Moderate Sedation Time:  10 minutes The patient was continuously monitored during the procedure by the interventional radiology nurse under my direct supervision. CONTRAST:  8 mL Omnipaque 300-administered into the collecting system(s) FLUOROSCOPY TIME:  Fluoroscopy Time: 0 minutes 42 seconds (46 mGy). COMPLICATIONS: None immediate. TECHNIQUE: The procedure, risks, benefits, and alternatives were explained to the patient. Questions regarding the procedure were encouraged and answered. The patient understands and consents to the procedure. The left flank was prepped with  chlorhexidine in a sterile fashion, and a sterile drape was applied covering the operative field. A sterile gown and sterile gloves were used for the procedure. Local anesthesia was provided with 1% Lidocaine. The left flank was interrogated with ultrasound and the left kidney identified. The kidney is hydronephrotic. A suitable access site on the skin overlying the lower pole, posterior calix was identified. After local mg anesthesia was achieved, a small skin nick was made with an 11 blade scalpel. A 21 gauge Accustick needle was then advanced under direct sonographic guidance into the lower pole of the left kidney. A 0.018 inch wire was advanced under fluoroscopic guidance into the left renal collecting system. The Accustick sheath was then advanced over the wire and a 0.018 system exchanged for a 0.035 system. Gentle hand injection of contrast material confirms placement of the sheath within the renal collecting system. There is moderate hydronephrosis. The tract from the scan into the renal collecting system was then dilated serially to 10-French. A 10-French Cook all-purpose drain was then placed and positioned under fluoroscopic guidance. The locking loop  is well formed within the left renal pelvis. The catheter was secured to the skin with 2-0 Prolene and a sterile bandage was placed. Catheter was left to gravity bag drainage. IMPRESSION: Successful placement of a left 10 French percutaneous nephrostomy tube. Electronically Signed   By: Jacqulynn Cadet M.D.   On: 08/06/2019 13:56    Lab Data:  CBC: Recent Labs  Lab 08/04/19 1702 08/05/19 0515 08/06/19 0454 08/06/19 1412 08/07/19 0248 08/08/19 0246 08/09/19 0521  WBC 17.2*   < > 8.7 2.3* 14.5* 11.5* 9.1  NEUTROABS 14.9*  --  6.0 1.8  --   --   --   HGB 14.2   < > 12.9* 14.3 12.6* 13.0 13.6  HCT 43.2   < > 39.7 44.9 39.3 40.4 41.1  MCV 86.6   < > 87.8 88.9 88.7 88.0 87.4  PLT 271   < > 225 221 216 244 246   < > = values in this interval  not displayed.   Basic Metabolic Panel: Recent Labs  Lab 08/05/19 0515 08/06/19 0454 08/07/19 0248 08/08/19 0246 08/09/19 0521  NA 140 140 138 137 138  K 3.5 3.7 4.0 4.1 3.4*  CL 108 110 111 106 102  CO2 26 21* 20* 22 23  GLUCOSE 148* 110* 103* 114* 114*  BUN 20 17 17 17 18   CREATININE 1.50* 1.21 1.35* 1.20 1.16  CALCIUM 8.3* 8.1* 8.2* 8.3* 8.4*   GFR: Estimated Creatinine Clearance: 69.3 mL/min (by C-G formula based on SCr of 1.16 mg/dL). Liver Function Tests: Recent Labs  Lab 08/04/19 1702 08/05/19 0515 08/07/19 0248  AST 16 13* 20  ALT 16 14 15   ALKPHOS 106 87 77  BILITOT 1.1 1.0 0.9  PROT 6.8 5.8* 5.4*  ALBUMIN 3.3* 2.7* 2.4*   No results for input(s): LIPASE, AMYLASE in the last 168 hours. No results for input(s): AMMONIA in the last 168 hours. Coagulation Profile: Recent Labs  Lab 08/05/19 0856  INR 1.1   Cardiac Enzymes: No results for input(s): CKTOTAL, CKMB, CKMBINDEX, TROPONINI in the last 168 hours. BNP (last 3 results) No results for input(s): PROBNP in the last 8760 hours. HbA1C: Recent Labs    08/08/19 0246  HGBA1C 5.8*   CBG: No results for input(s): GLUCAP in the last 168 hours. Lipid Profile: Recent Labs    08/09/19 0521  CHOL 135  HDL 15*  LDLCALC 73  TRIG 234*  CHOLHDL 9.0   Thyroid Function Tests: No results for input(s): TSH, T4TOTAL, FREET4, T3FREE, THYROIDAB in the last 72 hours. Anemia Panel: No results for input(s): VITAMINB12, FOLATE, FERRITIN, TIBC, IRON, RETICCTPCT in the last 72 hours. Urine analysis:    Component Value Date/Time   COLORURINE AMBER (A) 08/04/2019 1702   APPEARANCEUR CLOUDY (A) 08/04/2019 1702   LABSPEC 1.017 08/04/2019 1702   PHURINE 6.0 08/04/2019 1702   GLUCOSEU NEGATIVE 08/04/2019 1702   HGBUR SMALL (A) 08/04/2019 1702   BILIRUBINUR NEGATIVE 08/04/2019 [place] 08/04/2019 1702   PROTEINUR 30 (A) 08/04/2019 1702   NITRITE NEGATIVE 08/04/2019 1702   LEUKOCYTESUR LARGE (A)  08/04/2019 1702     Narcisa Ganesh M.D. [facility] 08/09/2019, 2:58 PM   Call night coverage person covering after 7pm

## 2019-08-09 NOTE — Care Management Important Message (Signed)
Important Message  Patient Details IM Letter given to Dessa Phi RN Case Manager to present to the Patient Name: [name] MRN: [id number] Date of Birth: [birth year]   Medicare Important Message Given:  Yes     Kerin Salen 08/09/2019, 10:37 AM

## 2019-08-09 NOTE — Plan of Care (Signed)

## 2019-08-09 NOTE — Progress Notes (Signed)
Patient ID: [name], male   DOB: [birth year], 74 y.o.   MRN: [id number]    Subjective: Minimal abdominal pain now.  Scheduled for cardiac catheterization later today based on echo results.  Objective: Vital signs in last 24 hours: Temp:  [97.5 F (36.4 C)-98 F (36.7 C)] 97.7 F (36.5 C) (04/09 0510) Pulse Rate:  [69-80] 69 (04/09 0510) Resp:  [17-20] 20 (04/09 0510) BP: (137-147)/(72-93) 143/80 (04/09 0510) SpO2:  [94 %-98 %] 96 % (04/09 0510) Weight:  [120 kg-124.4 kg] 120 kg (04/09 0510)  Intake/Output from previous day: 04/08 0701 - 04/09 0700 In: 1291.6 [P.O.:620; IV Piggyback:651.6] Out: 4450 [Urine:4450] Intake/Output this shift: No intake/output data recorded.  Physical Exam:  General: Alert and oriented CV: RRR Lungs: Clear Abdomen: Soft, ND Incisions: Ext: NT, No erythema  Lab Results: Recent Labs    08/07/19 0248 08/08/19 0246 08/09/19 0521  HGB 12.6* 13.0 13.6  HCT 39.3 40.4 41.1   BMET Recent Labs    08/08/19 0246 08/09/19 0521  NA 137 138  K 4.1 3.4*  CL 106 102  CO2 22 23  GLUCOSE 114* 114*  BUN 17 18  CREATININE 1.20 1.16  CALCIUM 8.3* 8.4*     Studies/Results:   Assessment/Plan: Left ureteral stone: Continue percutaneous nephrostomy drainage and empiric cephalosporin antibiotics therapy. With negative culture, would recommend 10 days total of 3rd/4th generation cephalosporin therapy as he is improving on this treatment.  Decision to proceed with elective outpatient treatment of his stone will obviously be impacted by his cardiac evaluation but his urologic surgery is now completely elective in setting of nephrostomy drainage.   LOS: 5 days   [name] 08/09/2019, 11:48 AM

## 2019-08-09 NOTE — Plan of Care (Signed)
  Problem: Skin Integrity: Goal: Risk for impaired skin integrity will decrease Outcome: Progressing   Problem: Education: Goal: Knowledge of General Education information will improve Description: Including pain rating scale, medication(s)/side effects and non-pharmacologic comfort measures Outcome: Completed/Met   Problem: Pain Managment: Goal: General experience of comfort will improve Outcome: Completed/Met

## 2019-08-09 NOTE — H&P (View-Only) (Signed)
Progress Note  Patient Name: [name] Date of Encounter: 08/09/2019  Primary Cardiologist: Fransico Him, MD   Subjective   Denies any chest pain or SOB.  2D echo showed mild LV dysfunction with EF 45-50% with global HK and moderate pulmonary HTN.   Inpatient Medications    Scheduled Meds: . aspirin EC  81 mg Oral Daily  . Chlorhexidine Gluconate Cloth  6 each Topical Daily  . heparin injection (subcutaneous)  5,000 Units Subcutaneous Q8H  . mouth rinse  15 mL Mouth Rinse BID  . sodium chloride flush  3 mL Intravenous Q12H  . sodium chloride flush  5 mL Intracatheter Q8H   Continuous Infusions: . sodium chloride    . sodium chloride 1 mL/kg/hr (08/09/19 0825)  . ceFEPime (MAXIPIME) IV 2 g (08/09/19 0536)   PRN Meds: sodium chloride, acetaminophen **OR** acetaminophen, morphine injection, ondansetron **OR** ondansetron (ZOFRAN) IV, sodium chloride flush, traMADol   Vital Signs    Vitals:   08/08/19 2152 08/09/19 0201 08/09/19 0510 08/09/19 0510  BP: 137/79 (!) 145/80 (!) 143/80 (!) 143/80  Pulse: 72 70 71 69  Resp: 18 18 20 20   Temp: 97.8 F (36.6 C) (!) 97.5 F (36.4 C) 97.7 F (36.5 C) 97.7 F (36.5 C)  TempSrc: Oral Oral Oral Oral  SpO2: 98% 95% 96% 96%  Weight:    120 kg  Height:        Intake/Output Summary (Last 24 hours) at 08/09/2019 0903 Last data filed at 08/09/2019 [id number] Gross per 24 hour  Intake 685 ml  Output 4000 ml  Net -3315 ml   Last 3 Weights 08/09/2019 08/08/2019 08/04/2019  Weight (lbs) 264 lb 8.8 oz 274 lb 4 oz 265 lb 6.9 oz  Weight (kg) 120 kg 124.4 kg 120.4 kg      Telemetry    NSR - Personally Reviewed  ECG    No new - Personally Reviewed  Physical Exam   GEN: Well nourished, well developed in no acute distress HEENT: Normal NECK: No JVD; No carotid bruits LYMPHATICS: No lymphadenopathy CARDIAC:RRR, no murmurs, rubs, gallops RESPIRATORY:  Clear to auscultation without rales, wheezing or rhonchi  ABDOMEN: Soft,  non-tender, non-distended MUSCULOSKELETAL:  No edema; No deformity  SKIN: Warm and dry NEUROLOGIC:  Alert and oriented x 3 PSYCHIATRIC:  Normal affect   Labs    High Sensitivity Troponin:   Recent Labs  Lab 08/06/19 1626 08/06/19 1923 08/06/19 2058 08/07/19 1251 08/07/19 1528  TROPONINIHS 589* 1,906* 1,707* 1,234* 1,141*      Chemistry Recent Labs  Lab 08/04/19 1702 08/04/19 1702 08/05/19 0515 08/06/19 0454 08/07/19 0248 08/08/19 0246 08/09/19 0521  NA 141   < > 140   < > 138 137 138  K 3.4*   < > 3.5   < > 4.0 4.1 3.4*  CL 109   < > 108   < > 111 106 102  CO2 20*   < > 26   < > 20* 22 23  GLUCOSE 198*   < > 148*   < > 103* 114* 114*  BUN 22   < > 20   < > 17 17 18   CREATININE 1.50*   < > 1.50*   < > 1.35* 1.20 1.16  CALCIUM 8.6*   < > 8.3*   < > 8.2* 8.3* 8.4*  PROT 6.8  --  5.8*  --  5.4*  --   --   ALBUMIN 3.3*  --  2.7*  --  2.4*  --   --   AST 16  --  13*  --  20  --   --   ALT 16  --  14  --  15  --   --   ALKPHOS 106  --  87  --  77  --   --   BILITOT 1.1  --  1.0  --  0.9  --   --   GFRNONAA 45*   < > 45*   < > 51* 59* >60  GFRAA 52*   < > 52*   < > 59* >60 >60  ANIONGAP 12   < > 6   < > 7 9 13    < > = values in this interval not displayed.     Hematology Recent Labs  Lab 08/07/19 0248 08/08/19 0246 08/09/19 0521  WBC 14.5* 11.5* 9.1  RBC 4.43 4.59 4.70  HGB 12.6* 13.0 13.6  HCT 39.3 40.4 41.1  MCV 88.7 88.0 87.4  MCH 28.4 28.3 28.9  MCHC 32.1 32.2 33.1  RDW 14.4 14.2 14.0  PLT 216 244 246    BNPNo results for input(s): BNP, PROBNP in the last 168 hours.   DDimer No results for input(s): DDIMER in the last 168 hours.   Radiology    ECHOCARDIOGRAM COMPLETE  Result Date: 08/08/2019    ECHOCARDIOGRAM REPORT   Patient Name:   [name] Date of Exam: 08/07/2019 Medical Rec #:  [id number]         Height:       68.0 in Accession #:    [id number]        Weight:       265.4 lb Date of Birth:  [birth year]         BSA:          2.305 m Patient  Age:    74 years          BP:           122/74 mmHg Patient Gender: M                 HR:           71 bpm. Exam Location:  Inpatient Procedure: 2D Echo, Cardiac Doppler and Color Doppler Indications:     Elevated Troponin  History:         Patient has no prior history of Echocardiogram examinations.                  Morbid obesity; Risk Factors:Hypertension. AKI, sepsis, CKD,                  elevated Troponin.  Sonographer:     Dustin Flock Referring Phys:  Marcus Hook Diagnosing Phys: Lyman Bishop MD  Sonographer Comments: Patient is morbidly obese. IMPRESSIONS  1. Left ventricular ejection fraction, by estimation, is 45 to 50%. The left ventricle has low normal function. The left ventricle demonstrates global hypokinesis. There is mild left ventricular hypertrophy. Left ventricular diastolic parameters are consistent with Grade I diastolic dysfunction (impaired relaxation). There is incoordinate septal motion.  2. Right ventricular systolic function is low normal. The right ventricular size is normal. There is moderately elevated pulmonary artery systolic pressure. The estimated right ventricular systolic pressure is 99991111 mmHg.  3. The mitral valve is abnormal. Trivial mitral valve regurgitation.  4. The aortic valve is tricuspid. Aortic valve regurgitation is not visualized.  5. The inferior vena cava is normal in  size with greater than 50% respiratory variability, suggesting right atrial pressure of 3 mmHg. FINDINGS  Left Ventricle: Left ventricular ejection fraction, by estimation, is 45 to 50%. The left ventricle has low normal function. The left ventricle demonstrates global hypokinesis. The left ventricular internal cavity size was normal in size. There is mild left ventricular hypertrophy. Incoordinate septal motion and abnormal (paradoxical) septal motion, consistent with left bundle branch block. Left ventricular diastolic parameters are consistent with Grade I diastolic dysfunction  (impaired relaxation). Indeterminate filling pressures. Right Ventricle: The right ventricular size is normal. No increase in right ventricular wall thickness. Right ventricular systolic function is low normal. There is moderately elevated pulmonary artery systolic pressure. The tricuspid regurgitant velocity  is 3.63 m/s, and with an assumed right atrial pressure of 3 mmHg, the estimated right ventricular systolic pressure is 99991111 mmHg. Left Atrium: Left atrial size was normal in size. Right Atrium: Right atrial size was normal in size. Pericardium: There is no evidence of pericardial effusion. Mitral Valve: The mitral valve is abnormal. There is mild thickening of the mitral valve leaflet(s). Mild mitral annular calcification. Trivial mitral valve regurgitation. Tricuspid Valve: The tricuspid valve is grossly normal. Tricuspid valve regurgitation is mild. Aortic Valve: The aortic valve is tricuspid. Aortic valve regurgitation is not visualized. Pulmonic Valve: The pulmonic valve was normal in structure. Pulmonic valve regurgitation is not visualized. Aorta: The aortic root and ascending aorta are structurally normal, with no evidence of dilitation. Venous: The inferior vena cava is normal in size with greater than 50% respiratory variability, suggesting right atrial pressure of 3 mmHg. IAS/Shunts: No atrial level shunt detected by color flow Doppler.  LEFT VENTRICLE PLAX 2D LVIDd:         5.00 cm  Diastology LVIDs:         3.70 cm  LV e' lateral:   9.57 cm/s LV PW:         1.20 cm  LV E/e' lateral: 6.1 LV IVS:        1.20 cm  LV e' medial:    7.18 cm/s LVOT diam:     2.60 cm  LV E/e' medial:  8.1 LV SV:         103 LV SV Index:   45 LVOT Area:     5.31 cm  RIGHT VENTRICLE RV Basal diam:  3.20 cm RV S prime:     10.30 cm/s TAPSE (M-mode): 2.4 cm LEFT ATRIUM             Index       RIGHT ATRIUM           Index LA diam:        4.10 cm 1.78 cm/m  RA Area:     18.50 cm LA Vol (A2C):   47.3 ml 20.52 ml/m RA Volume:    63.00 ml  27.33 ml/m LA Vol (A4C):   46.3 ml 20.09 ml/m LA Biplane Vol: 48.4 ml 21.00 ml/m  AORTIC VALVE LVOT Vmax:   93.30 cm/s LVOT Vmean:  57.700 cm/s LVOT VTI:    0.194 m  AORTA Ao Root diam: 3.10 cm MITRAL VALVE               TRICUSPID VALVE MV Area (PHT): 3.77 cm    TR Peak grad:   52.7 mmHg MV Decel Time: 201 msec    TR Vmax:        363.00 cm/s MV E velocity: 58.00 cm/s MV A velocity: 91.30 cm/s  SHUNTS MV E/A ratio:  0.64        Systemic VTI:  0.19 m                            Systemic Diam: 2.60 cm Lyman Bishop MD Electronically signed by Lyman Bishop MD Signature Date/Time: 08/07/2019/4:39:54 PM    Final (Updated)     Cardiac Studies   Echo results pending  Patient Profile     74 y.o. male with hx of chronic migraines, CKD stage 3, morbid obesity, and nephrolithiasis who is being seen for elevated troponin.   Assessment & Plan    Elevated troponin - HStrop 589>1906>1707>1234 - elevation in the setting of sepsis, hypertensive emergency, hypoxemia, AKI - 2D echo with mild LV dysfunction with EF 45-50% with global HK - ? demand ischemia vs CAD - Patient has not had chest pain. EKG with out ischemic changes - given elevated Trop and LV dysfunction and pulmonary HTN, recommend right and left heart cath  - LDL 73 and TAG 234 - continue ASA 81mg  daily - add Lopressor 25mg  BID -Cardiac catheterization was discussed with the patient fully. The patient understands that risks include but are not limited to stroke (1 in 1000), death (1 in 66), kidney failure [usually temporary] (1 in 500), bleeding (1 in 200), allergic reaction [possibly serious] (1 in 200).  The patient understands and is willing to proceed.     Hypertensive emergency - BP improved at 143/72mmHg - starting Lopressor 25mg  BID for LV dysfunction and NSTEMI  AKI/urosepsis/hydronephrosis - creatinine baseline 1.5 - today level at 1.16  Hypokalemia -K+ 3.4 -replete to keep K+>4  I have spent a total of 35  minutes with patient reviewing 2D echo , telemetry, EKGs, labs and examining patient as well as establishing an assessment and plan that was discussed with the patient.  > 50% of time was spent in direct patient care.    For questions or updates, please contact [place] Please consult [email or web address] for contact info under        Signed, Fransico Him, MD  08/09/2019, 9:03 AM

## 2019-08-09 NOTE — Interval H&P Note (Signed)
Cath Lab Visit (complete for each Cath Lab visit)  Clinical Evaluation Leading to the Procedure:   ACS: Yes.    Non-ACS:    Anginal Classification: CCS IV  Anti-ischemic medical therapy: Minimal Therapy (1 class of medications)  Non-Invasive Test Results: Intermediate-risk stress test findings: cardiac mortality 1-3%/year  Prior CABG: No previous CABG  EF 45% by echo    History and Physical Interval Note:  08/09/2019 1:37 PM  [name]  has presented today for surgery, with the diagnosis of NSTEMI.  The various methods of treatment have been discussed with the patient and family. After consideration of risks, benefits and other options for treatment, the patient has consented to  Procedure(s): RIGHT/LEFT HEART CATH AND CORONARY ANGIOGRAPHY (N/A) as a surgical intervention.  The patient's history has been reviewed, patient examined, no change in status, stable for surgery.  I have reviewed the patient's chart and labs.  Questions were answered to the patient's satisfaction.     Larae Grooms

## 2019-08-09 NOTE — Progress Notes (Signed)
TCTS consulted for CABG evaluation. °

## 2019-08-09 NOTE — Progress Notes (Signed)
Pharmacy Antibiotic Note  [name] is a 74 y.o. male with kidney stone presented to the ED on 08/04/2019 with c/o fever and generalized weakness. He was started on ceftriaxone on admission for UTI and underwent perc nephrostomy tube placed on 4/6 for left proximal ureteral stone with hydronephrosis.  Pharmacy was consulted on 4/6 to broaden abx to cefepime and vancomycin for suspected sepsis.  Vancomycin stopped 4/8.  Today, 08/09/2019: - D#6 total antibiotics - afeb, wbc wnl - scr continues to trend down - Cx data remains negative  Plan: - Continue cefepime 2gm IV q8h - monitor renal function closely - Noted urology plan to complete 14 days total therapy with 3rd/4th gen cephalosporin ________________________________________  Height: 5\' 8"  (172.7 cm) Weight: 120 kg (264 lb 8.8 oz) IBW/kg (Calculated) : 68.4  Temp (24hrs), Avg:97.7 F (36.5 C), Min:97.5 F (36.4 C), Max:98 F (36.7 C)  Recent Labs  Lab 08/04/19 1702 08/04/19 1702 08/05/19 0515 08/05/19 0515 08/06/19 0454 08/06/19 1412 08/06/19 1434 08/06/19 1626 08/07/19 0248 08/08/19 0246 08/09/19 0521  WBC 17.2*   < > 12.5*   < > 8.7 2.3*  --   --  14.5* 11.5* 9.1  CREATININE 1.50*   < > 1.50*  --  1.21  --   --   --  1.35* 1.20 1.16  LATICACIDVEN 2.1*  --   --   --   --   --  3.4* 1.9  --   --   --    < > = values in this interval not displayed.    Estimated Creatinine Clearance: 69.3 mL/min (by C-G formula based on SCr of 1.16 mg/dL).    Allergies  Allergen Reactions  . Codeine     Bad Headache     Antimicrobials this admission:  4/5 CTX>> 4/6 4/6 vanc>>4/8 4/6 cefepime>>  Dose adjustments this admission:  --  Microbiology results:  4/5 UA: rare bacteria, large leuk, >50 WBC 4/4 BCx: ngtd 4/6 BCx x2: ngtd 4/6 ucx: ngf 4/6 MRSA PCR: neg  Thank you for allowing pharmacy to be a part of this patient's care.  Netta Cedars PharmD, BCPS 08/09/2019 9:38 AM

## 2019-08-09 NOTE — Progress Notes (Signed)
Progress Note  Patient Name: [name] [name]  Primary Cardiologist: [name] [name], [name]   Subjective   Denies any chest pain or [name].  2D echo showed mild LV dysfunction with EF 45-50% with global HK and moderate pulmonary HTN.   Inpatient Medications    Scheduled Meds: . aspirin EC  81 mg Oral Daily  . Chlorhexidine Gluconate Cloth  6 each Topical Daily  . heparin injection (subcutaneous)  5,000 Units Subcutaneous Q8H  . mouth rinse  15 mL Mouth Rinse BID  . sodium chloride flush  3 mL Intravenous Q12H  . sodium chloride flush  5 mL Intracatheter Q8H   Continuous Infusions: . sodium chloride    . sodium chloride 1 mL/kg/hr (08/09/19 0825)  . ceFEPime (MAXIPIME) IV 2 g (08/09/19 0536)   PRN Meds: sodium chloride, acetaminophen **OR** acetaminophen, morphine injection, ondansetron **OR** ondansetron (ZOFRAN) IV, sodium chloride flush, traMADol   Vital Signs    Vitals:   08/08/19 2152 08/09/19 0201 08/09/19 0510 08/09/19 0510  BP: 137/79 (!) 145/80 (!) 143/80 (!) 143/80  Pulse: 72 70 71 69  Resp: 18 18 20 20   Temp: 97.8 F (36.6 C) (!) 97.5 F (36.4 C) 97.7 F (36.5 C) 97.7 F (36.5 C)  TempSrc: Oral Oral Oral Oral  SpO2: 98% 95% 96% 96%  Weight:    120 kg  Height:        Intake/Output Summary (Last 24 hours) at [name] 0903 Last data filed at [name] [id number] Gross per 24 hour  Intake 685 ml  Output 4000 ml  Net -3315 ml   Last 3 Weights [name] 08/08/2019 08/04/2019  Weight (lbs) 264 lb 8.8 oz 274 lb 4 oz 265 lb 6.9 oz  Weight (kg) 120 kg 124.4 kg 120.4 kg      Telemetry    NSR - Personally Reviewed  ECG    No new - Personally Reviewed  Physical Exam   GEN: Well nourished, well developed in no acute distress HEENT: Normal NECK: No JVD; No carotid bruits LYMPHATICS: No lymphadenopathy CARDIAC:RRR, no murmurs, rubs, gallops RESPIRATORY:  Clear to auscultation without rales, wheezing or rhonchi  ABDOMEN: Soft,  non-tender, non-distended MUSCULOSKELETAL:  No edema; No deformity  SKIN: Warm and dry NEUROLOGIC:  Alert and oriented x 3 PSYCHIATRIC:  Normal affect   Labs    High Sensitivity Troponin:   Recent Labs  Lab 08/06/19 1626 08/06/19 1923 08/06/19 2058 08/07/19 1251 08/07/19 1528  TROPONINIHS 589* 1,906* 1,707* 1,234* 1,141*      Chemistry Recent Labs  Lab 08/04/19 1702 08/04/19 1702 08/05/19 0515 08/06/19 0454 08/07/19 0248 08/08/19 0246 08/09/19 0521  NA 141   < > 140   < > 138 137 138  K 3.4*   < > 3.5   < > 4.0 4.1 3.4*  CL 109   < > 108   < > 111 106 102  CO2 20*   < > 26   < > 20* 22 23  GLUCOSE 198*   < > 148*   < > 103* 114* 114*  BUN 22   < > 20   < > 17 17 18   CREATININE 1.50*   < > 1.50*   < > 1.35* 1.20 1.16  CALCIUM 8.6*   < > 8.3*   < > 8.2* 8.3* 8.4*  PROT 6.8  --  5.8*  --  5.4*  --   --   ALBUMIN 3.3*  --  2.7*  --  2.4*  --   --   AST 16  --  13*  --  20  --   --   ALT 16  --  14  --  15  --   --   ALKPHOS 106  --  87  --  77  --   --   BILITOT 1.1  --  1.0  --  0.9  --   --   GFRNONAA 45*   < > 45*   < > 51* 59* >60  GFRAA 52*   < > 52*   < > 59* >60 >60  ANIONGAP 12   < > 6   < > 7 9 13    < > = values in this interval not displayed.     Hematology Recent Labs  Lab 08/07/19 0248 08/08/19 0246 08/09/19 0521  WBC 14.5* 11.5* 9.1  RBC 4.43 4.59 4.70  HGB 12.6* 13.0 13.6  HCT 39.3 40.4 41.1  MCV 88.7 88.0 87.4  MCH 28.4 28.3 28.9  MCHC 32.1 32.2 33.1  RDW 14.4 14.2 14.0  PLT 216 244 246    BNPNo results for input(s): BNP, PROBNP in the last 168 hours.   DDimer No results for input(s): DDIMER in the last 168 hours.   Radiology    ECHOCARDIOGRAM COMPLETE  Result Date: 08/08/2019    ECHOCARDIOGRAM REPORT   Patient Name:   [name] [name] Date of Exam: 08/07/2019 Medical Rec #:  [id number]         Height:       68.0 in Accession #:    [id number]        Weight:       265.4 lb Date of Birth:  1945-08-31         BSA:          2.305 m Patient  Age:    74 years          BP:           122/74 mmHg Patient Gender: M                 HR:           71 bpm. Exam Location:  Inpatient Procedure: 2D Echo, Cardiac Doppler and Color Doppler Indications:     Elevated Troponin  History:         Patient has no prior history of Echocardiogram examinations.                  Morbid obesity; Risk Factors:Hypertension. AKI, sepsis, CKD,                  elevated Troponin.  Sonographer:     [name] [name] Referring Phys:  [name] Diagnosing Phys: [name] [name]  Sonographer Comments: Patient is morbidly obese. IMPRESSIONS  1. Left ventricular ejection fraction, by estimation, is 45 to 50%. The left ventricle has low normal function. The left ventricle demonstrates global hypokinesis. There is mild left ventricular hypertrophy. Left ventricular diastolic parameters are consistent with Grade I diastolic dysfunction (impaired relaxation). There is incoordinate septal motion.  2. Right ventricular systolic function is low normal. The right ventricular size is normal. There is moderately elevated pulmonary artery systolic pressure. The estimated right ventricular systolic pressure is 99991111 mmHg.  3. The mitral valve is abnormal. Trivial mitral valve regurgitation.  4. The aortic valve is tricuspid. Aortic valve regurgitation is not visualized.  5. The inferior vena cava is normal in  size with greater than 50% respiratory variability, suggesting right atrial pressure of 3 mmHg. FINDINGS  Left Ventricle: Left ventricular ejection fraction, by estimation, is 45 to 50%. The left ventricle has low normal function. The left ventricle demonstrates global hypokinesis. The left ventricular internal cavity size was normal in size. There is mild left ventricular hypertrophy. Incoordinate septal motion and abnormal (paradoxical) septal motion, consistent with left bundle branch block. Left ventricular diastolic parameters are consistent with Grade I diastolic dysfunction  (impaired relaxation). Indeterminate filling pressures. Right Ventricle: The right ventricular size is normal. No increase in right ventricular wall thickness. Right ventricular systolic function is low normal. There is moderately elevated pulmonary artery systolic pressure. The tricuspid regurgitant velocity  is 3.63 m/s, and with an assumed right atrial pressure of 3 mmHg, the estimated right ventricular systolic pressure is 99991111 mmHg. Left Atrium: Left atrial size was normal in size. Right Atrium: Right atrial size was normal in size. Pericardium: There is no evidence of pericardial effusion. Mitral Valve: The mitral valve is abnormal. There is mild thickening of the mitral valve leaflet(s). Mild mitral annular calcification. Trivial mitral valve regurgitation. Tricuspid Valve: The tricuspid valve is grossly normal. Tricuspid valve regurgitation is mild. Aortic Valve: The aortic valve is tricuspid. Aortic valve regurgitation is not visualized. Pulmonic Valve: The pulmonic valve was normal in structure. Pulmonic valve regurgitation is not visualized. Aorta: The aortic root and ascending aorta are structurally normal, with no evidence of dilitation. Venous: The inferior vena cava is normal in size with greater than 50% respiratory variability, suggesting right atrial pressure of 3 mmHg. IAS/Shunts: No atrial level shunt detected by color flow Doppler.  LEFT VENTRICLE PLAX 2D LVIDd:         5.00 cm  Diastology LVIDs:         3.70 cm  LV e' lateral:   9.57 cm/s LV PW:         1.20 cm  LV E/e' lateral: 6.1 LV IVS:        1.20 cm  LV e' medial:    7.18 cm/s LVOT diam:     2.60 cm  LV E/e' medial:  8.1 LV SV:         103 LV SV Index:   45 LVOT Area:     5.31 cm  RIGHT VENTRICLE RV Basal diam:  3.20 cm RV S prime:     10.30 cm/s TAPSE (M-mode): 2.4 cm LEFT ATRIUM             Index       RIGHT ATRIUM           Index LA diam:        4.10 cm 1.78 cm/m  RA Area:     18.50 cm LA Vol (A2C):   47.3 ml 20.52 ml/m RA Volume:    63.00 ml  27.33 ml/m LA Vol (A4C):   46.3 ml 20.09 ml/m LA Biplane Vol: 48.4 ml 21.00 ml/m  AORTIC VALVE LVOT Vmax:   93.30 cm/s LVOT Vmean:  57.700 cm/s LVOT VTI:    0.194 m  AORTA Ao Root diam: 3.10 cm MITRAL VALVE               TRICUSPID VALVE MV Area (PHT): 3.77 cm    TR Peak grad:   52.7 mmHg MV Decel Time: 201 msec    TR Vmax:        363.00 cm/s MV E velocity: 58.00 cm/s MV A velocity: 91.30 cm/s  SHUNTS MV E/A ratio:  0.64        Systemic VTI:  0.19 m                            Systemic Diam: 2.60 cm [name] [name] Electronically signed by [name] [name] Signature Date/Time: 08/07/2019/4:39:54 PM    Final (Updated)     Cardiac Studies   Echo results pending  Patient Profile     74 y.o. male with hx of chronic migraines, CKD stage 3, morbid obesity, and nephrolithiasis who is being seen for elevated troponin.   Assessment & Plan    Elevated troponin - HStrop 589>1906>1707>1234 - elevation in the setting of sepsis, hypertensive emergency, hypoxemia, AKI - 2D echo with mild LV dysfunction with EF 45-50% with global HK - ? demand ischemia vs CAD - Patient has not had chest pain. EKG with out ischemic changes - given elevated Trop and LV dysfunction and pulmonary HTN, recommend right and left heart cath  - LDL 73 and TAG 234 - continue ASA 81mg  daily - add Lopressor 25mg  BID -Cardiac catheterization was discussed with the patient fully. The patient understands that risks include but are not limited to stroke (1 in 1000), death (1 in 86), kidney failure [usually temporary] (1 in 500), bleeding (1 in 200), allergic reaction [possibly serious] (1 in 200).  The patient understands and is willing to proceed.     Hypertensive emergency - BP improved at 143/51mmHg - starting Lopressor 25mg  BID for LV dysfunction and NSTEMI  AKI/urosepsis/hydronephrosis - creatinine baseline 1.5 - today level at 1.16  Hypokalemia -K+ 3.4 -replete to keep K+>4  I have spent a total of 35  minutes with patient reviewing 2D echo , telemetry, EKGs, labs and examining patient as well as establishing an assessment and plan that was discussed with the patient.  > 50% of time was spent in direct patient care.    For questions or updates, please contact [place] Please consult [email or web address] for contact info under        Signed, [name] [name], [name]  [name], 9:03 AM

## 2019-08-10 DIAGNOSIS — I2510 Atherosclerotic heart disease of native coronary artery without angina pectoris: Secondary | ICD-10-CM

## 2019-08-10 DIAGNOSIS — N200 Calculus of kidney: Secondary | ICD-10-CM | POA: Diagnosis not present

## 2019-08-10 DIAGNOSIS — I214 Non-ST elevation (NSTEMI) myocardial infarction: Secondary | ICD-10-CM

## 2019-08-10 DIAGNOSIS — I2583 Coronary atherosclerosis due to lipid rich plaque: Secondary | ICD-10-CM

## 2019-08-10 LAB — BASIC METABOLIC PANEL
Anion gap: 11 (ref 5–15)
BUN: 15 mg/dL (ref 8–23)
CO2: 26 mmol/L (ref 22–32)
Calcium: 8.7 mg/dL — ABNORMAL LOW (ref 8.9–10.3)
Chloride: 103 mmol/L (ref 98–111)
Creatinine, Ser: 1.18 mg/dL (ref 0.61–1.24)
GFR calc Af Amer: >60 mL/min (ref >60)
GFR calc non Af Amer: >60 mL/min (ref >60)
Glucose, Bld: 107 mg/dL — ABNORMAL HIGH (ref 70–99)
Potassium: 3.8 mmol/L (ref 3.5–5.1)
Sodium: 140 mmol/L (ref 135–145)

## 2019-08-10 LAB — CBC
HCT: 42.2 % (ref 39.0–52.0)
Hemoglobin: 14.1 g/dL (ref 13.0–17.0)
MCH: 28.5 pg (ref 26.0–34.0)
MCHC: 33.4 g/dL (ref 30.0–36.0)
MCV: 85.3 fL (ref 80.0–100.0)
Platelets: 298 10*3/uL (ref 150–400)
RBC: 4.95 MIL/uL (ref 4.22–5.81)
RDW: 13.9 % (ref 11.5–15.5)
WBC: 12.8 10*3/uL — ABNORMAL HIGH (ref 4.0–10.5)
nRBC: 0.2 % (ref 0.0–0.2)

## 2019-08-10 LAB — PROCALCITONIN: Procalcitonin: 18.96 ng/mL

## 2019-08-10 MED ORDER — LIDOCAINE HCL (PF) 1 % IJ SOLN
INTRAMUSCULAR | Status: AC
  Filled 2019-08-10: qty 30

## 2019-08-10 MED ORDER — ROSUVASTATIN CALCIUM 5 MG PO TABS: 10.0000 mg | ORAL_TABLET | Freq: Every day | ORAL | Status: DC

## 2019-08-10 MED ORDER — OXYCODONE HCL 5 MG PO TABS
5.0000 mg | ORAL_TABLET | Freq: Four times a day (QID) | ORAL | Status: AC | PRN
  Administered 2019-08-10 – 2019-08-11 (×2): 5 mg via ORAL
  Filled 2019-08-10 (×2): qty 1

## 2019-08-10 MED ORDER — METOPROLOL TARTRATE 50 MG PO TABS
50.0000 mg | ORAL_TABLET | Freq: Two times a day (BID) | ORAL | Status: DC
  Administered 2019-08-10 – 2019-08-13 (×3): 50 mg via ORAL
  Filled 2019-08-10 (×7): qty 1

## 2019-08-10 MED ORDER — ROSUVASTATIN CALCIUM 20 MG PO TABS
40.0000 mg | ORAL_TABLET | Freq: Every day | ORAL | Status: DC
  Administered 2019-08-11 – 2019-08-12 (×2): 40 mg via ORAL
  Filled 2019-08-10 (×3): qty 2

## 2019-08-10 NOTE — Plan of Care (Signed)
  Problem: Activity: Goal: Risk for activity intolerance will decrease Outcome: Progressing   Problem: Safety: Goal: Ability to remain free from injury will improve Outcome: Progressing   

## 2019-08-10 NOTE — Progress Notes (Addendum)
Progress Note  Patient Name: [name] Date of Encounter: 08/10/2019  Primary Cardiologist: Fransico Him, MD   Subjective   Denies any chest pain or SOB.  Had cath yesterday showing severe 3v CAD.  Inpatient Medications    Scheduled Meds: . aspirin EC  81 mg Oral Daily  . Chlorhexidine Gluconate Cloth  6 each Topical Daily  . heparin  5,000 Units Subcutaneous Q8H  . mouth rinse  15 mL Mouth Rinse BID  . metoprolol tartrate  25 mg Oral BID  . sodium chloride flush  3 mL Intravenous Q12H  . sodium chloride flush  3 mL Intravenous Q12H  . sodium chloride flush  5 mL Intracatheter Q8H   Continuous Infusions: . sodium chloride    . ceFEPime (MAXIPIME) IV 2 g (08/10/19 0529)   PRN Meds: sodium chloride, acetaminophen, morphine injection, ondansetron **OR** ondansetron (ZOFRAN) IV, ondansetron (ZOFRAN) IV, sodium chloride flush, traMADol   Vital Signs    Vitals:   08/09/19 1603 08/09/19 2112 08/10/19 0315 08/10/19 0323  BP: 133/63 (!) 148/66  (!) 146/73  Pulse:  76  67  Resp:  (!) 22  (!) 21  Temp: 98.9 F (37.2 C) 97.9 F (36.6 C)  97.8 F (36.6 C)  TempSrc: Oral Oral  Oral  SpO2:  96%  96%  Weight:   120.6 kg   Height:        Intake/Output Summary (Last 24 hours) at 08/10/2019 [id number] Last data filed at 08/10/2019 [id number] Gross per 24 hour  Intake 347.5 ml  Output 1725 ml  Net -1377.5 ml   Last 3 Weights 08/10/2019 08/09/2019 08/08/2019  Weight (lbs) 265 lb 14.4 oz 264 lb 8.8 oz 274 lb 4 oz  Weight (kg) 120.611 kg 120 kg 124.4 kg      Telemetry    NSR - Personally Reviewed  ECG    No new EKG to review - Personally Reviewed  Physical Exam   GEN: Well nourished, well developed in no acute distress HEENT: Normal NECK: No JVD; No carotid bruits LYMPHATICS: No lymphadenopathy CARDIAC:RRR, no murmurs, rubs, gallops RESPIRATORY:  Clear to auscultation without rales, wheezing or rhonchi  ABDOMEN: Soft, non-tender, non-distended MUSCULOSKELETAL:  No edema;  No deformity  SKIN: Warm and dry. Right radial artery cath site with no hematoma or ecchymosis and 2+ radial artery pulse NEUROLOGIC:  Alert and oriented x 3 PSYCHIATRIC:  Normal affect    Labs    High Sensitivity Troponin:   Recent Labs  Lab 08/06/19 1626 08/06/19 1923 08/06/19 2058 08/07/19 1251 08/07/19 1528  TROPONINIHS 589* 1,906* 1,707* 1,234* 1,141*      Chemistry Recent Labs  Lab 08/04/19 1702 08/04/19 1702 08/05/19 0515 08/06/19 0454 08/07/19 0248 08/07/19 0248 08/08/19 0246 08/08/19 0246 08/09/19 0521 08/09/19 1803 08/10/19 0328  NA 141   < > 140   < > 138   < > 137  --  138  --  140  K 3.4*   < > 3.5   < > 4.0   < > 4.1  --  3.4*  --  3.8  CL 109   < > 108   < > 111   < > 106  --  102  --  103  CO2 20*   < > 26   < > 20*   < > 22  --  23  --  26  GLUCOSE 198*   < > 148*   < > 103*   < > 114*  --  114*  --  107*  BUN 22   < > 20   < > 17   < > 17  --  18  --  15  CREATININE 1.50*   < > 1.50*   < > 1.35*   < > 1.20   < > 1.16 1.18 1.18  CALCIUM 8.6*   < > 8.3*   < > 8.2*   < > 8.3*  --  8.4*  --  8.7*  PROT 6.8  --  5.8*  --  5.4*  --   --   --   --   --   --   ALBUMIN 3.3*  --  2.7*  --  2.4*  --   --   --   --   --   --   AST 16  --  13*  --  20  --   --   --   --   --   --   ALT 16  --  14  --  15  --   --   --   --   --   --   ALKPHOS 106  --  87  --  77  --   --   --   --   --   --   BILITOT 1.1  --  1.0  --  0.9  --   --   --   --   --   --   GFRNONAA 45*   < > 45*   < > 51*   < > 59*   < > >60 >60 >60  GFRAA 52*   < > 52*   < > 59*   < > >60   < > >60 >60 >60  ANIONGAP 12   < > 6   < > 7   < > 9  --  13  --  11   < > = values in this interval not displayed.     Hematology Recent Labs  Lab 08/09/19 0521 08/09/19 1803 08/10/19 0328  WBC 9.1 10.1 12.8*  RBC 4.70 5.29 4.95  HGB 13.6 14.7 14.1  HCT 41.1 44.8 42.2  MCV 87.4 84.7 85.3  MCH 28.9 27.8 28.5  MCHC 33.1 32.8 33.4  RDW 14.0 13.9 13.9  PLT 246 297 298    BNPNo results for  input(s): BNP, PROBNP in the last 168 hours.   DDimer No results for input(s): DDIMER in the last 168 hours.   Radiology    CARDIAC CATHETERIZATION  Addendum Date: 08/09/2019    Ramus lesion is 75% stenosed.  Mid LAD-1 lesion is 80% stenosed.  Mid LAD-2 lesion is 99% stenosed.  Prox RCA lesion is 90% stenosed.  Mid RCA lesion is 25% stenosed.  Prox Cx lesion is 80% stenosed.  LV end diastolic pressure is moderately elevated.  There is no aortic valve stenosis.  Severe three vessel disease.  Will get cardiac surgery opinion.  Diffuse distal vessel disease as well.  His RCA and circumflex are treatable percutaneously.  LAD appears to be a chronic occlusion and may not have a good target for LIMA graft.  He has significant other medical issues as well.  Discussed with Dr. [name].  If he is not a candidate for CABG and if PCI done, urology could wait 30 days post procedure to perform urologic surgery.   Result Date: 08/09/2019  Ramus lesion is 75% stenosed.  Mid LAD-1 lesion is 80%  stenosed.  Mid LAD-2 lesion is 99% stenosed.  Prox RCA lesion is 90% stenosed.  Mid RCA lesion is 25% stenosed.  Prox Cx lesion is 80% stenosed.  LV end diastolic pressure is moderately elevated.  There is no aortic valve stenosis.  Severe three vessel disease.  Will get cardiac surgery opinion.  Diffuse distal vessel disease as well.  His RCA and circumflex are treatable percutaneously.  LAD appears to be a chronic occlusion and may not have a good target for LIMA graft.  He has significant other medical issues as well.  Discussed with Dr. [name].  If PCI done, they could wait 30 days post procedure to perform urologic surgery.    Cardiac Studies  Cardiac Cath Conclusion    Ramus lesion is 75% stenosed.  Mid LAD-1 lesion is 80% stenosed.  Mid LAD-2 lesion is 99% stenosed.  Prox RCA lesion is 90% stenosed.  Mid RCA lesion is 25% stenosed.  Prox Cx lesion is 80% stenosed.  LV end diastolic pressure  is moderately elevated.  There is no aortic valve stenosis.   Severe three vessel disease.  Will get cardiac surgery opinion.  Diffuse distal vessel disease as well.    His RCA and circumflex are treatable percutaneously.  LAD appears to be a chronic occlusion and may not have a good target for LIMA graft.  He has significant other medical issues as well.  Discussed with Dr. [name].  If he is not a candidate for CABG and if PCI done, urology could wait 30 days post procedure to perform urologic surgery.    Coronary Diagrams  Diagnostic Dominance: Right   2D echo 08/2019 IMPRESSIONS   1. Left ventricular ejection fraction, by estimation, is 45 to 50%. The  left ventricle has low normal function. The left ventricle demonstrates  global hypokinesis. There is mild left ventricular hypertrophy. Left  ventricular diastolic parameters are  consistent with Grade I diastolic dysfunction (impaired relaxation). There  is incoordinate septal motion.  2. Right ventricular systolic function is low normal. The right  ventricular size is normal. There is moderately elevated pulmonary artery  systolic pressure. The estimated right ventricular systolic pressure is  [id number] mmHg.  3. The mitral valve is abnormal. Trivial mitral valve regurgitation.  4. The aortic valve is tricuspid. Aortic valve regurgitation is not  visualized.  5. The inferior vena cava is normal in size with greater than 50%  respiratory variability, suggesting right atrial pressure of 3 mmHg.   Patient Profile     74 y.o. male with hx of chronic migraines, CKD stage 3, morbid obesity, and nephrolithiasis who is being seen for elevated troponin.   Assessment & Plan    Elevated troponin/CAD - HStrop 589>1906>1707>1234 - 2D echo with mild LV dysfunction with EF 45-50% with global HK - likely demand ischemia in the setting of  sepsis, hypertensive emergency, hypoxemia, AKI and underlying CAD - Patient has not had chest  pain. EKG with out ischemic changes - heart cath yesterday showed severe 3V CAD with 90% pRCA, 80% pLCx, 75% RI in a small vessel, 80% mid LAD and subtotalled mid LAD that appears chronic.   - CVTS has seen patient and feels poor candidate for CABG and recommends staged PCI of the RCA and LCx - LDL 73 and TAG 234, HbA1C 5.8% - continue ASA 81mg  daily, BB and add statin - plan PCI on Monday  Hypertensive emergency - BP improved but still elevated at 146/49mmHg - increase Lopressor to 50mg  BID  -  would benefit from ARB which we will start after PCI due to CKD  AKI/urosepsis/hydronephrosis - creatinine baseline 1.5 - today level at 1.18  Hypokalemia -K+ 3.8 -replete to keep K+>4  HLD -LDL goal < 70 and LDL was 73 this admit -start Crestor 10mg  daily -will need FLP and ALT in 6 weeks  I have spent a total of 35 minutes with patient reviewing cardiac cath with patient, CVTS consult , telemetry, EKGs, labs and examining patient as well as establishing an assessment and plan that was discussed with the patient.  > 50% of time was spent in direct patient care.    For questions or updates, please contact [place] Please consult [email or web address] for contact info under        Signed, Fransico Him, MD  08/10/2019, 7:23 AM

## 2019-08-10 NOTE — Progress Notes (Signed)
74 y.o, male inpatient. History of obstructing stone with 5 mm obstructing nephrolithiasis. IR placed a nephrostomy tube on  4.6.21. Patient is reporting pain at exit site worse with movement.   Pertinent Imaging None since placement  Pertinent IR History 4.6.21 - Nephrostomy tube placement left sided  Cut existing suture and replaced with new suture with air nknot. Patient reports an improvement with pain.

## 2019-08-10 NOTE — Progress Notes (Signed)
Assesment/PLAN Acute coronary syndrome For PCI RCA/LCx Per cardiology-GDMT ASA 81 metoprolol 50 twice daily dose increased to high intensity 40-continue telemetry showing NSR Resolving sepsis secondary to 5 mm obstructing nephrolithiasis with nephrostomy tube currently Nephrostomy site is paining, IR requested reeval-draining good amounts of dark urine Definitive stone management per Dr. 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 March Rummage 2002 Monitor BMI 40, OHSS habitus Needs outpatient weight loss   S 72 white male known history left renal calculi BPH status post attempt ureteroscopy/IR management 02/06/2019 Dr. [name], migraine, CKD 2-3, BMI 40- admit [facility] ED 08/04/2019 generalized weakness fever nausea vomiting T-max 102 blood pressure 150/70 Lactic acid 2.1 CT =5 mm obstructing calculus Underwent percutaneous nephrostomy for 08/20/2019-developed post procedure sepsis, hypoxic respiratory failure Troponin noted +589 cardiology consulted-Echo = abnormal EF 45-50 with high-grade stenosis on cath 4/995% proximal RCA-CT surgery consulted recommending PCI management RCA, LCx    Doing well sitting up in chair tells me he feels well without chest pain Usually has some swelling of his lower extremities left side has had an ankle fracture in the past Since he was admitted to the ICU he is having left flank pain around the site of his nephrostomy but he is also having floaters in his eyes his vision is undetermined not blurry  O BP (!) 146/73 (BP Location: Left Arm)   Pulse 67   Temp 97.8 F (36.6 C) (Oral)   Resp (!) 21   Ht 5\' 8"  (1.727 m)   Wt 120.6 kg   SpO2 96%   BMI 40.43 kg/m  Awake alert coherent no distress EOMI NCAT vision by direct confrontation is normal and extraocular movements are intact his power in  upper extremities is 5/5 Abdomen is obese with midline scar from symphysis to umbilicus Left lower extremity edema greater than right grade 1 Scar over left medial malleolus Chest is clinically clear with no added sound no rales no rhonchi Thick neck  Labs/studies reviewed today   BUN/creatinine 15/1.1, procalcitonin 49-->18 WBC 12.8 hemoglobin 14.1 platelet 298

## 2019-08-11 DIAGNOSIS — E876 Hypokalemia: Secondary | ICD-10-CM

## 2019-08-11 DIAGNOSIS — N200 Calculus of kidney: Secondary | ICD-10-CM | POA: Diagnosis not present

## 2019-08-11 LAB — CBC WITH DIFFERENTIAL/PLATELET
Abs Immature Granulocytes: 0.43 10*3/uL — ABNORMAL HIGH (ref 0.00–0.07)
Basophils Absolute: 0.1 10*3/uL (ref 0.0–0.1)
Basophils Relative: 1 %
Eosinophils Absolute: 0.2 10*3/uL (ref 0.0–0.5)
Eosinophils Relative: 3 %
HCT: 39.4 % (ref 39.0–52.0)
Hemoglobin: 13 g/dL (ref 13.0–17.0)
Immature Granulocytes: 5 %
Lymphocytes Relative: 20 %
Lymphs Abs: 1.7 10*3/uL (ref 0.7–4.0)
MCH: 28.4 pg (ref 26.0–34.0)
MCHC: 33 g/dL (ref 30.0–36.0)
MCV: 86.2 fL (ref 80.0–100.0)
Monocytes Absolute: 0.7 10*3/uL (ref 0.1–1.0)
Monocytes Relative: 8 %
Neutro Abs: 5.6 10*3/uL (ref 1.7–7.7)
Neutrophils Relative %: 63 %
Platelets: 276 10*3/uL (ref 150–400)
RBC: 4.57 MIL/uL (ref 4.22–5.81)
RDW: 14 % (ref 11.5–15.5)
WBC: 8.8 10*3/uL (ref 4.0–10.5)
nRBC: 0 % (ref 0.0–0.2)

## 2019-08-11 LAB — CULTURE, BLOOD (ROUTINE X 2)
Culture: NO GROWTH
Culture: NO GROWTH
Special Requests: ADEQUATE
Special Requests: ADEQUATE

## 2019-08-11 LAB — RENAL FUNCTION PANEL
Albumin: 2.4 g/dL — ABNORMAL LOW (ref 3.5–5.0)
Anion gap: 10 (ref 5–15)
BUN: 15 mg/dL (ref 8–23)
CO2: 25 mmol/L (ref 22–32)
Calcium: 8.3 mg/dL — ABNORMAL LOW (ref 8.9–10.3)
Chloride: 103 mmol/L (ref 98–111)
Creatinine, Ser: 1.18 mg/dL (ref 0.61–1.24)
GFR calc Af Amer: >60 mL/min
GFR calc non Af Amer: >60 mL/min
Glucose, Bld: 95 mg/dL (ref 70–99)
Phosphorus: 2.6 mg/dL (ref 2.5–4.6)
Potassium: 3.5 mmol/L (ref 3.5–5.1)
Sodium: 138 mmol/L (ref 135–145)

## 2019-08-11 MED ORDER — ASPIRIN 81 MG PO CHEW
81.0000 mg | CHEWABLE_TABLET | ORAL | Status: AC
  Administered 2019-08-12: 81 mg via ORAL
  Filled 2019-08-11: qty 1

## 2019-08-11 MED ORDER — SODIUM CHLORIDE 0.9 % IV SOLN: 250.0000 mL | INTRAVENOUS | Status: DC | PRN

## 2019-08-11 MED ORDER — SODIUM CHLORIDE 0.9 % WEIGHT BASED INFUSION: 1.0000 mL/kg/h | INTRAVENOUS | Status: DC

## 2019-08-11 MED ORDER — SODIUM CHLORIDE 0.9% FLUSH: 3.0000 mL | INTRAVENOUS | Status: DC | PRN

## 2019-08-11 MED ORDER — POTASSIUM CHLORIDE CRYS ER 20 MEQ PO TBCR
40.0000 meq | EXTENDED_RELEASE_TABLET | Freq: Once | ORAL | Status: AC
  Administered 2019-08-11: 40 meq via ORAL
  Filled 2019-08-11: qty 2

## 2019-08-11 MED ORDER — SODIUM CHLORIDE 0.9% FLUSH
3.0000 mL | Freq: Two times a day (BID) | INTRAVENOUS | Status: DC
  Administered 2019-08-11 – 2019-08-12 (×2): 3 mL via INTRAVENOUS

## 2019-08-11 MED ORDER — SODIUM CHLORIDE 0.9 % WEIGHT BASED INFUSION
3.0000 mL/kg/h | INTRAVENOUS | Status: DC
  Administered 2019-08-12: 3 mL/kg/h via INTRAVENOUS

## 2019-08-11 NOTE — Progress Notes (Signed)
Progress Note  Patient Name: [name] Date of Encounter: 08/11/2019  Primary Cardiologist: Fransico Him, MD   Subjective   Denies any chest pain or SOB.  Anxious to get on with PCI  Inpatient Medications    Scheduled Meds: . aspirin EC  81 mg Oral Daily  . Chlorhexidine Gluconate Cloth  6 each Topical Daily  . heparin  5,000 Units Subcutaneous Q8H  . mouth rinse  15 mL Mouth Rinse BID  . metoprolol tartrate  50 mg Oral BID  . rosuvastatin  40 mg Oral q1800  . sodium chloride flush  3 mL Intravenous Q12H  . sodium chloride flush  3 mL Intravenous Q12H  . sodium chloride flush  5 mL Intracatheter Q8H   Continuous Infusions: . sodium chloride    . ceFEPime (MAXIPIME) IV 2 g (08/11/19 0642)   PRN Meds: sodium chloride, acetaminophen, morphine injection, ondansetron **OR** ondansetron (ZOFRAN) IV, ondansetron (ZOFRAN) IV, oxyCODONE, sodium chloride flush   Vital Signs    Vitals:   08/10/19 0851 08/10/19 1316 08/10/19 2047 08/11/19 0600  BP: 129/73 140/83 130/78 120/66  Pulse: 65  63 (!) 58  Resp:   20 18  Temp:  (!) 97.4 F (36.3 C) 98.1 F (36.7 C) 97.6 F (36.4 C)  TempSrc:  Oral Oral Oral  SpO2:  97% 97% 97%  Weight:    121.9 kg  Height:        Intake/Output Summary (Last 24 hours) at 08/11/2019 0825 Last data filed at 08/11/2019 [id number] Gross per 24 hour  Intake 1338.02 ml  Output 925 ml  Net 413.02 ml   Last 3 Weights 08/11/2019 08/10/2019 08/09/2019  Weight (lbs) 268 lb 12.8 oz 265 lb 14.4 oz 264 lb 8.8 oz  Weight (kg) 121.927 kg 120.611 kg 120 kg      Telemetry    NSR - Personally Reviewed  ECG    No new EKG to review- Personally Reviewed  Physical Exam   GEN: Well nourished, well developed in no acute distress HEENT: Normal NECK: No JVD; No carotid bruits LYMPHATICS: No lymphadenopathy CARDIAC:RRR, no murmurs, rubs, gallops RESPIRATORY:  Clear to auscultation without rales, wheezing or rhonchi  ABDOMEN: Soft, non-tender,  non-distended MUSCULOSKELETAL:  No edema; No deformity  SKIN: Warm and dry NEUROLOGIC:  Alert and oriented x 3 PSYCHIATRIC:  Normal affect    Labs    High Sensitivity Troponin:   Recent Labs  Lab 08/06/19 1626 08/06/19 1923 08/06/19 2058 08/07/19 1251 08/07/19 1528  TROPONINIHS 589* 1,906* 1,707* 1,234* 1,141*      Chemistry Recent Labs  Lab 08/04/19 1702 08/04/19 1702 08/05/19 0515 08/06/19 0454 08/07/19 0248 08/08/19 0246 08/09/19 0521 08/09/19 0521 08/09/19 1803 08/10/19 0328 08/11/19 0215  NA 141   < > 140   < > 138   < > 138  --   --  140 138  K 3.4*   < > 3.5   < > 4.0   < > 3.4*  --   --  3.8 3.5  CL 109   < > 108   < > 111   < > 102  --   --  103 103  CO2 20*   < > 26   < > 20*   < > 23  --   --  26 25  GLUCOSE 198*   < > 148*   < > 103*   < > 114*  --   --  107* 95  BUN 22   < >  20   < > 17   < > 18  --   --  15 15  CREATININE 1.50*   < > 1.50*   < > 1.35*   < > 1.16   < > 1.18 1.18 1.18  CALCIUM 8.6*   < > 8.3*   < > 8.2*   < > 8.4*  --   --  8.7* 8.3*  PROT 6.8  --  5.8*  --  5.4*  --   --   --   --   --   --   ALBUMIN 3.3*   < > 2.7*  --  2.4*  --   --   --   --   --  2.4*  AST 16  --  13*  --  20  --   --   --   --   --   --   ALT 16  --  14  --  15  --   --   --   --   --   --   ALKPHOS 106  --  87  --  77  --   --   --   --   --   --   BILITOT 1.1  --  1.0  --  0.9  --   --   --   --   --   --   GFRNONAA 45*   < > 45*   < > 51*   < > >60   < > >60 >60 >60  GFRAA 52*   < > 52*   < > 59*   < > >60   < > >60 >60 >60  ANIONGAP 12   < > 6   < > 7   < > 13  --   --  11 10   < > = values in this interval not displayed.     Hematology Recent Labs  Lab 08/09/19 1803 08/10/19 0328 08/11/19 0215  WBC 10.1 12.8* 8.8  RBC 5.29 4.95 4.57  HGB 14.7 14.1 13.0  HCT 44.8 42.2 39.4  MCV 84.7 85.3 86.2  MCH 27.8 28.5 28.4  MCHC 32.8 33.4 33.0  RDW 13.9 13.9 14.0  PLT 297 298 276    BNPNo results for input(s): BNP, PROBNP in the last 168 hours.    DDimer No results for input(s): DDIMER in the last 168 hours.   Radiology    CARDIAC CATHETERIZATION  Addendum Date: 08/09/2019    Ramus lesion is 75% stenosed.  Mid LAD-1 lesion is 80% stenosed.  Mid LAD-2 lesion is 99% stenosed.  Prox RCA lesion is 90% stenosed.  Mid RCA lesion is 25% stenosed.  Prox Cx lesion is 80% stenosed.  LV end diastolic pressure is moderately elevated.  There is no aortic valve stenosis.  Severe three vessel disease.  Will get cardiac surgery opinion.  Diffuse distal vessel disease as well.  His RCA and circumflex are treatable percutaneously.  LAD appears to be a chronic occlusion and may not have a good target for LIMA graft.  He has significant other medical issues as well.  Discussed with Dr. [name].  If he is not a candidate for CABG and if PCI done, urology could wait 30 days post procedure to perform urologic surgery.   Result Date: 08/09/2019  Ramus lesion is 75% stenosed.  Mid LAD-1 lesion is 80% stenosed.  Mid LAD-2 lesion is 99% stenosed.  Prox RCA lesion is  90% stenosed.  Mid RCA lesion is 25% stenosed.  Prox Cx lesion is 80% stenosed.  LV end diastolic pressure is moderately elevated.  There is no aortic valve stenosis.  Severe three vessel disease.  Will get cardiac surgery opinion.  Diffuse distal vessel disease as well.  His RCA and circumflex are treatable percutaneously.  LAD appears to be a chronic occlusion and may not have a good target for LIMA graft.  He has significant other medical issues as well.  Discussed with Dr. [name].  If PCI done, they could wait 30 days post procedure to perform urologic surgery.    Cardiac Studies  Cardiac Cath Conclusion    Ramus lesion is 75% stenosed.  Mid LAD-1 lesion is 80% stenosed.  Mid LAD-2 lesion is 99% stenosed.  Prox RCA lesion is 90% stenosed.  Mid RCA lesion is 25% stenosed.  Prox Cx lesion is 80% stenosed.  LV end diastolic pressure is moderately elevated.  There is no  aortic valve stenosis.   Severe three vessel disease.  Will get cardiac surgery opinion.  Diffuse distal vessel disease as well.    His RCA and circumflex are treatable percutaneously.  LAD appears to be a chronic occlusion and may not have a good target for LIMA graft.  He has significant other medical issues as well.  Discussed with Dr. [name].  If he is not a candidate for CABG and if PCI done, urology could wait 30 days post procedure to perform urologic surgery.    Coronary Diagrams  Diagnostic Dominance: Right   2D echo 08/2019 IMPRESSIONS   1. Left ventricular ejection fraction, by estimation, is 45 to 50%. The  left ventricle has low normal function. The left ventricle demonstrates  global hypokinesis. There is mild left ventricular hypertrophy. Left  ventricular diastolic parameters are  consistent with Grade I diastolic dysfunction (impaired relaxation). There  is incoordinate septal motion.  2. Right ventricular systolic function is low normal. The right  ventricular size is normal. There is moderately elevated pulmonary artery  systolic pressure. The estimated right ventricular systolic pressure is  99991111 mmHg.  3. The mitral valve is abnormal. Trivial mitral valve regurgitation.  4. The aortic valve is tricuspid. Aortic valve regurgitation is not  visualized.  5. The inferior vena cava is normal in size with greater than 50%  respiratory variability, suggesting right atrial pressure of 3 mmHg.   Patient Profile     74 y.o. male with hx of chronic migraines, CKD stage 3, morbid obesity, and nephrolithiasis who is being seen for elevated troponin.   Assessment & Plan    Elevated troponin/CAD - HStrop 589>1906>1707>1234 - 2D echo with mild LV dysfunction with EF 45-50% with global HK - likely demand ischemia in the setting of  sepsis, hypertensive emergency, hypoxemia, AKI and underlying CAD - Patient has not had chest pain. EKG with out ischemic changes -  heart cath Friday showing severe 3V CAD with 90% pRCA, 80% pLCx, 75% RI in a small vessel, 80% mid LAD and subtotalled mid LAD that appears chronic.   - CVTS has seen patient and feels poor candidate for CABG and recommends staged PCI of the RCA and LCx - LDL 73 and TAG 234, HbA1C 5.8% - continue ASA 81mg  daily, BB and add statin - plan for PCI RCA and LCx tomorrow  Hypertensive emergency - BP controlled at 120/72mmHg - Continue Lopressor to 50mg  BID  - would benefit from ARB which we will start after PCI due  to CKD  AKI/urosepsis/hydronephrosis - creatinine baseline 1.5 - today level at 1.18  Hypokalemia -K+ 3.8 -replete to keep K+>4  HLD -LDL goal < 70 and LDL was 73 this admit -started Crestor 10mg  daily -will need FLP and ALT in 6 weeks   For questions or updates, please contact [facility] Please consult [email or web address] for contact info under        Signed, Fransico Him, MD  08/11/2019, 8:25 AM

## 2019-08-11 NOTE — Progress Notes (Signed)
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 March Rummage 2002 Monitor BMI 40, OHSS habitus Needs outpatient weight loss  Patient for cardiac cath/PCI 4/10 a.m.-once performed and cleared by cardiology potential discharge subsequently patient will update family himself per him Currently on heparin for DVT prophylaxis   S 76 white male known history left renal calculi BPH status post attempt ureteroscopy/IR management 02/06/2019 Dr. [name], migraine, CKD 2-3, BMI 40- admit [facility] ED 08/04/2019 generalized weakness fever nausea vomiting T-max 102 blood pressure 150/70 Lactic acid 2.1 CT =5 mm obstructing calculus Underwent percutaneous nephrostomy for 08/20/2019-developed post procedure sepsis, hypoxic respiratory failure Troponin noted +589 cardiology consulted-Echo = abnormal EF 45-50 with high-grade stenosis on cath 4/995% proximal RCA-CT surgery consulted recommending PCI management RCA, LCx   No chest pain fever chills nephrostomy not painful at this time unless  Eating drinking Some chronic left foot pain swelling seems about the same as prior Floaters have disappeared  O BP 133/78   Pulse 60   Temp 97.6 F (36.4 C) (Oral)   Resp 18   Ht 5\' 8"  (1.727 m)   Wt 121.9 kg   SpO2 97%   BMI 40.87 kg/m  A  EOMI Mallampati 4 no  icterus no pallor S1-S2 no murmur rub or gallop Chest clear no added sound no rales no rhonchi No rebound no guarding ROM intact power 5/5 no focal deficit psych euthymic  Labs/studies reviewed today   BUN/creatinine 15/1.1, procalcitonin 49-->18 WBC 12.8-->8.8 hemoglobin 14.1 platelet 298

## 2019-08-12 ENCOUNTER — Encounter (HOSPITAL_COMMUNITY)
Admission: EM | Disposition: A | Discharge status: Home / Self Care | Payer: Self-pay | Source: Home / Self Care | Attending: Family Medicine

## 2019-08-12 DIAGNOSIS — I2510 Atherosclerotic heart disease of native coronary artery without angina pectoris: Secondary | ICD-10-CM | POA: Diagnosis not present

## 2019-08-12 DIAGNOSIS — E876 Hypokalemia: Secondary | ICD-10-CM | POA: Diagnosis not present

## 2019-08-12 DIAGNOSIS — I161 Hypertensive emergency: Secondary | ICD-10-CM | POA: Diagnosis not present

## 2019-08-12 DIAGNOSIS — R778 Other specified abnormalities of plasma proteins: Secondary | ICD-10-CM | POA: Diagnosis not present

## 2019-08-12 HISTORY — PX: INTRAVASCULAR ULTRASOUND/IVUS: CATH118244

## 2019-08-12 HISTORY — PX: CORONARY STENT INTERVENTION: CATH118234

## 2019-08-12 LAB — POCT ACTIVATED CLOTTING TIME
Activated Clotting Time: 345 seconds
Activated Clotting Time: 252 seconds
Activated Clotting Time: 268 seconds
Activated Clotting Time: 279 seconds

## 2019-08-12 LAB — CBC WITH DIFFERENTIAL/PLATELET
Abs Immature Granulocytes: 0.4 10*3/uL — ABNORMAL HIGH (ref 0.00–0.07)
Basophils Absolute: 0.1 10*3/uL (ref 0.0–0.1)
Basophils Relative: 1 %
Eosinophils Absolute: 0.2 10*3/uL (ref 0.0–0.5)
Eosinophils Relative: 2 %
HCT: 41.6 % (ref 39.0–52.0)
Hemoglobin: 13.5 g/dL (ref 13.0–17.0)
Immature Granulocytes: 4 %
Lymphocytes Relative: 15 %
Lymphs Abs: 1.5 10*3/uL (ref 0.7–4.0)
MCH: 28 pg (ref 26.0–34.0)
MCHC: 32.5 g/dL (ref 30.0–36.0)
MCV: 86.1 fL (ref 80.0–100.0)
Monocytes Absolute: 0.8 10*3/uL (ref 0.1–1.0)
Monocytes Relative: 8 %
Neutro Abs: 7.1 10*3/uL (ref 1.7–7.7)
Neutrophils Relative %: 70 %
Platelets: 297 10*3/uL (ref 150–400)
RBC: 4.83 MIL/uL (ref 4.22–5.81)
RDW: 14 % (ref 11.5–15.5)
WBC: 10.1 10*3/uL (ref 4.0–10.5)
nRBC: 0 % (ref 0.0–0.2)

## 2019-08-12 LAB — BASIC METABOLIC PANEL
Anion gap: 11 (ref 5–15)
BUN: 14 mg/dL (ref 8–23)
CO2: 24 mmol/L (ref 22–32)
Calcium: 8.8 mg/dL — ABNORMAL LOW (ref 8.9–10.3)
Chloride: 104 mmol/L (ref 98–111)
Creatinine, Ser: 1.13 mg/dL (ref 0.61–1.24)
GFR calc Af Amer: >60 mL/min (ref >60)
GFR calc non Af Amer: >60 mL/min (ref >60)
Glucose, Bld: 105 mg/dL — ABNORMAL HIGH (ref 70–99)
Potassium: 4.4 mmol/L (ref 3.5–5.1)
Sodium: 139 mmol/L (ref 135–145)

## 2019-08-12 SURGERY — CORONARY STENT INTERVENTION
Anesthesia: LOCAL

## 2019-08-12 MED ORDER — SODIUM CHLORIDE 0.9 % IV SOLN: INTRAVENOUS | Status: AC

## 2019-08-12 MED ORDER — VERAPAMIL HCL 2.5 MG/ML IV SOLN
INTRAVENOUS | Status: DC | PRN
  Administered 2019-08-12: 10 mL via INTRA_ARTERIAL

## 2019-08-12 MED ORDER — NITROGLYCERIN 1 MG/10 ML FOR IR/CATH LAB
INTRA_ARTERIAL | Status: AC
  Filled 2019-08-12: qty 10

## 2019-08-12 MED ORDER — HEPARIN (PORCINE) IN NACL 1000-0.9 UT/500ML-% IV SOLN
INTRAVENOUS | Status: DC | PRN
  Administered 2019-08-12: 500 mL

## 2019-08-12 MED ORDER — HEPARIN SODIUM (PORCINE) 1000 UNIT/ML IJ SOLN
INTRAMUSCULAR | Status: DC | PRN
  Administered 2019-08-12: 10000 [IU] via INTRAVENOUS
  Administered 2019-08-12: 2000 [IU] via INTRAVENOUS

## 2019-08-12 MED ORDER — HEPARIN SODIUM (PORCINE) 1000 UNIT/ML IJ SOLN
INTRAMUSCULAR | Status: AC
  Filled 2019-08-12: qty 1

## 2019-08-12 MED ORDER — LIDOCAINE HCL (PF) 1 % IJ SOLN
INTRAMUSCULAR | Status: AC
  Filled 2019-08-12: qty 30

## 2019-08-12 MED ORDER — TICAGRELOR 90 MG PO TABS
90.0000 mg | ORAL_TABLET | Freq: Two times a day (BID) | ORAL | Status: DC
  Administered 2019-08-12 – 2019-08-13 (×2): 90 mg via ORAL
  Filled 2019-08-12 (×2): qty 1

## 2019-08-12 MED ORDER — SODIUM CHLORIDE 0.9% FLUSH: 3.0000 mL | INTRAVENOUS | Status: DC | PRN

## 2019-08-12 MED ORDER — LABETALOL HCL 5 MG/ML IV SOLN: 10.0000 mg | INTRAVENOUS | Status: AC | PRN

## 2019-08-12 MED ORDER — NITROGLYCERIN 1 MG/10 ML FOR IR/CATH LAB
INTRA_ARTERIAL | Status: DC | PRN
  Administered 2019-08-12 (×2): 200 ug via INTRACORONARY

## 2019-08-12 MED ORDER — LIDOCAINE HCL (PF) 1 % IJ SOLN
INTRAMUSCULAR | Status: DC | PRN
  Administered 2019-08-12: 2 mL

## 2019-08-12 MED ORDER — SODIUM CHLORIDE 0.9% FLUSH: 3.0000 mL | Freq: Two times a day (BID) | INTRAVENOUS | Status: DC

## 2019-08-12 MED ORDER — HYDRALAZINE HCL 20 MG/ML IJ SOLN: 10.0000 mg | INTRAMUSCULAR | Status: AC | PRN

## 2019-08-12 MED ORDER — SODIUM CHLORIDE 0.9 % IV SOLN
INTRAVENOUS | Status: AC | PRN
  Administered 2019-08-12: 120 mL/h via INTRAVENOUS

## 2019-08-12 MED ORDER — TICAGRELOR 90 MG PO TABS
180.0000 mg | ORAL_TABLET | Freq: Once | ORAL | Status: AC
  Administered 2019-08-12: 180 mg via ORAL
  Filled 2019-08-12: qty 2

## 2019-08-12 MED ORDER — MIDAZOLAM HCL 2 MG/2ML IJ SOLN
INTRAMUSCULAR | Status: DC | PRN
  Administered 2019-08-12 (×2): 1 mg via INTRAVENOUS

## 2019-08-12 MED ORDER — FENTANYL CITRATE (PF) 100 MCG/2ML IJ SOLN
INTRAMUSCULAR | Status: DC | PRN
  Administered 2019-08-12: 50 ug via INTRAVENOUS
  Administered 2019-08-12: 25 ug via INTRAVENOUS

## 2019-08-12 MED ORDER — IOHEXOL 350 MG/ML SOLN
INTRAVENOUS | Status: DC | PRN
  Administered 2019-08-12: 12:00:00 125 mL

## 2019-08-12 MED ORDER — HEPARIN (PORCINE) IN NACL 1000-0.9 UT/500ML-% IV SOLN
INTRAVENOUS | Status: AC
  Filled 2019-08-12: qty 1000

## 2019-08-12 MED ORDER — MIDAZOLAM HCL 2 MG/2ML IJ SOLN
INTRAMUSCULAR | Status: AC
  Filled 2019-08-12: qty 2

## 2019-08-12 MED ORDER — IOHEXOL 350 MG/ML SOLN
INTRAVENOUS | Status: AC
  Filled 2019-08-12: qty 1

## 2019-08-12 MED ORDER — ENOXAPARIN SODIUM 40 MG/0.4ML ~~LOC~~ SOLN
40.0000 mg | SUBCUTANEOUS | Status: DC
  Administered 2019-08-13: 40 mg via SUBCUTANEOUS
  Filled 2019-08-12: qty 0.4

## 2019-08-12 MED ORDER — SODIUM CHLORIDE 0.9 % IV SOLN: 250.0000 mL | INTRAVENOUS | Status: DC | PRN

## 2019-08-12 MED ORDER — VERAPAMIL HCL 2.5 MG/ML IV SOLN
INTRAVENOUS | Status: AC
  Filled 2019-08-12: qty 2

## 2019-08-12 MED ORDER — FENTANYL CITRATE (PF) 100 MCG/2ML IJ SOLN
INTRAMUSCULAR | Status: AC
  Filled 2019-08-12: qty 2

## 2019-08-12 SURGICAL SUPPLY — 31 items
BALLN  ~~LOC~~ SAPPHIRE 4.5X18 (BALLOONS) ×2
BALLN EMERGE MR 2.5X8 (BALLOONS) ×2
BALLN SAPPHIRE 3.0X12 (BALLOONS) ×2
BALLN SAPPHIRE ~~LOC~~ 3.75X12 (BALLOONS) ×1 IMPLANT
BALLN WOLVERINE 3.50X10 (BALLOONS) ×2
BALLN ~~LOC~~ SAPPHIRE 4.5X18 (BALLOONS) ×1
BALLOON EMERGE MR 2.5X8 (BALLOONS) IMPLANT
BALLOON SAPPHIRE 3.0X12 (BALLOONS) IMPLANT
BALLOON WOLVERINE 3.50X10 (BALLOONS) IMPLANT
BALLOON ~~LOC~~ SAPPHIRE 4.5X18 (BALLOONS) IMPLANT
CATH INFINITI JR4 5F (CATHETERS) ×1 IMPLANT
CATH LAUNCHER 5F EBU3.5 (CATHETERS) ×1 IMPLANT
CATH OPTICROSS HD (CATHETERS) ×1 IMPLANT
CATH VISTA GUIDE 6FR JR4 (CATHETERS) ×1 IMPLANT
DEVICE RAD COMP TR BAND LRG (VASCULAR PRODUCTS) ×1 IMPLANT
GLIDESHEATH SLEND A-KIT 6F 22G (SHEATH) ×1 IMPLANT
GUIDEWIRE INQWIRE 1.5J.035X260 (WIRE) IMPLANT
HOVERMATT SINGLE USE (MISCELLANEOUS) ×1 IMPLANT
INQWIRE 1.5J .035X260CM (WIRE) ×2
KIT ENCORE 26 ADVANTAGE (KITS) ×1 IMPLANT
KIT HEART LEFT (KITS) ×2 IMPLANT
PACK CARDIAC CATHETERIZATION (CUSTOM PROCEDURE TRAY) ×2 IMPLANT
SHEATH PROBE COVER 6X72 (BAG) ×1 IMPLANT
SLED PULL BACK IVUS (MISCELLANEOUS) ×1 IMPLANT
STENT SYNERGY XD 3.50X16 (Permanent Stent) IMPLANT
STENT SYNERGY XD 4.0X20 (Permanent Stent) IMPLANT
SYNERGY XD 3.50X16 (Permanent Stent) ×2 IMPLANT
SYNERGY XD 4.0X20 (Permanent Stent) ×2 IMPLANT
TRANSDUCER W/STOPCOCK (MISCELLANEOUS) ×2 IMPLANT
TUBING CIL FLEX 10 FLL-RA (TUBING) ×2 IMPLANT
WIRE RUNTHROUGH .014X180CM (WIRE) ×2 IMPLANT

## 2019-08-12 NOTE — Interval H&P Note (Signed)
History and Physical Interval Note:  08/12/2019 10:41 AM  [name]  has presented today for surgery, with the diagnosis of NSTEMI.  The various methods of treatment have been discussed with the patient and family. After consideration of risks, benefits and other options for treatment, the patient has consented to  Procedure(s): CORONARY STENT INTERVENTION (N/A) as a surgical intervention.  The patient's history has been reviewed, patient examined, no change in status, stable for surgery.  I have reviewed the patient's chart and labs.  Questions were answered to the patient's satisfaction.    Cath Lab Visit (complete for each Cath Lab visit)  Clinical Evaluation Leading to the Procedure:   ACS: Yes.    Non-ACS:  N/A  [place] Hewins

## 2019-08-12 NOTE — Progress Notes (Signed)
Cardiology Progress Note  Patient ID: [name] MRN: [id number] DOB: 10/28/1945 Date of Encounter: 08/12/2019  Primary Cardiologist: Fransico Him, MD  Subjective  No complaints this AM. PCI to RCA/LCX today.   ROS:  All other ROS reviewed and negative. Pertinent positives noted in the HPI.     Inpatient Medications  Scheduled Meds: . [MAR Hold] aspirin EC  81 mg Oral Daily  . [MAR Hold] Chlorhexidine Gluconate Cloth  6 each Topical Daily  . [MAR Hold] heparin  5,000 Units Subcutaneous Q8H  . [MAR Hold] mouth rinse  15 mL Mouth Rinse BID  . [MAR Hold] metoprolol tartrate  50 mg Oral BID  . [MAR Hold] rosuvastatin  40 mg Oral q1800  . sodium chloride flush  3 mL Intravenous Q12H   Continuous Infusions: . [MAR Hold] sodium chloride    . sodium chloride    . sodium chloride 1 mL/kg/hr (08/12/19 0513)  . [MAR Hold] ceFEPime (MAXIPIME) IV 121 mL/hr at 08/12/19 0800   PRN Meds: [MAR Hold] sodium chloride, sodium chloride, [MAR Hold] acetaminophen, [MAR Hold]  morphine injection, [MAR Hold] ondansetron **OR** [MAR Hold] ondansetron (ZOFRAN) IV, [MAR Hold] ondansetron (ZOFRAN) IV, sodium chloride flush   Vital Signs   Vitals:   08/11/19 2040 08/11/19 2228 08/12/19 0429 08/12/19 1017  BP: (!) 147/77  (!) 147/83   Pulse: (!) 57 68 (!) 58 61  Resp: 20  20   Temp: 99.5 F (37.5 C)  98.2 F (36.8 C)   TempSrc: Oral  Oral   SpO2: 98%  98%   Weight:   120.2 kg   Height:        Intake/Output Summary (Last 24 hours) at 08/12/2019 1018 Last data filed at 08/12/2019 0800 Gross per 24 hour  Intake 2443.79 ml  Output 1550 ml  Net 893.79 ml   Last 3 Weights 08/12/2019 08/11/2019 08/10/2019  Weight (lbs) 265 lb 1.6 oz 268 lb 12.8 oz 265 lb 14.4 oz  Weight (kg) 120.249 kg 121.927 kg 120.611 kg      Telemetry  Overnight telemetry shows SB 60s, which I personally reviewed.   Physical Exam   Vitals:   08/11/19 2040 08/11/19 2228 08/12/19 0429 08/12/19 1017  BP: (!) 147/77   (!) 147/83   Pulse: (!) 57 68 (!) 58 61  Resp: 20  20   Temp: 99.5 F (37.5 C)  98.2 F (36.8 C)   TempSrc: Oral  Oral   SpO2: 98%  98%   Weight:   120.2 kg   Height:         Intake/Output Summary (Last 24 hours) at 08/12/2019 1018 Last data filed at 08/12/2019 0800 Gross per 24 hour  Intake 2443.79 ml  Output 1550 ml  Net 893.79 ml    Last 3 Weights 08/12/2019 08/11/2019 08/10/2019  Weight (lbs) 265 lb 1.6 oz 268 lb 12.8 oz 265 lb 14.4 oz  Weight (kg) 120.249 kg 121.927 kg 120.611 kg    Body mass index is 40.31 kg/m.  General: Well nourished, well developed, in no acute distress Head: Atraumatic, normal size  Eyes: PEERLA, EOMI  Neck: Supple, no JVD Endocrine: No thryomegaly Cardiac: Normal S1, S2; RRR; no murmurs, rubs, or gallops Lungs: Clear to auscultation bilaterally, no wheezing, rhonchi or rales  Abd: Soft, nontender, no hepatomegaly  Ext: No edema, pulses 2+ Musculoskeletal: No deformities, BUE and BLE strength normal and equal Skin: Warm and dry, no rashes   Neuro: Alert and oriented to person, place, time, and  situation, CNII-XII grossly intact, no focal deficits  Psych: Normal mood and affect   Labs  High Sensitivity Troponin:   Recent Labs  Lab 08/06/19 1626 08/06/19 1923 08/06/19 2058 08/07/19 1251 08/07/19 1528  TROPONINIHS 589* 1,906* 1,707* 1,234* 1,141*     Cardiac EnzymesNo results for input(s): TROPONINI in the last 168 hours. No results for input(s): TROPIPOC in the last 168 hours.  Chemistry Recent Labs  Lab 08/07/19 0248 08/08/19 0246 08/10/19 0328 08/11/19 0215 08/12/19 0359  NA 138   < > 140 138 139  K 4.0   < > 3.8 3.5 4.4  CL 111   < > 103 103 104  CO2 20*   < > 26 25 24   GLUCOSE 103*   < > 107* 95 105*  BUN 17   < > 15 15 14   CREATININE 1.35*   < > 1.18 1.18 1.13  CALCIUM 8.2*   < > 8.7* 8.3* 8.8*  PROT 5.4*  --   --   --   --   ALBUMIN 2.4*  --   --  2.4*  --   AST 20  --   --   --   --   ALT 15  --   --   --   --   ALKPHOS  77  --   --   --   --   BILITOT 0.9  --   --   --   --   GFRNONAA 51*   < > >60 >60 >60  GFRAA 59*   < > >60 >60 >60  ANIONGAP 7   < > 11 10 11    < > = values in this interval not displayed.    Hematology Recent Labs  Lab 08/10/19 0328 08/11/19 0215 08/12/19 0359  WBC 12.8* 8.8 10.1  RBC 4.95 4.57 4.83  HGB 14.1 13.0 13.5  HCT 42.2 39.4 41.6  MCV 85.3 86.2 86.1  MCH 28.5 28.4 28.0  MCHC 33.4 33.0 32.5  RDW 13.9 14.0 14.0  PLT 298 276 297   BNPNo results for input(s): BNP, PROBNP in the last 168 hours.  DDimer No results for input(s): DDIMER in the last 168 hours.   Radiology  No results found.  Cardiac Studies  TTE 08/07/2019 1. Left ventricular ejection fraction, by estimation, is 45 to 50%. The  left ventricle has low normal function. The left ventricle demonstrates  global hypokinesis. There is mild left ventricular hypertrophy. Left  ventricular diastolic parameters are  consistent with Grade I diastolic dysfunction (impaired relaxation). There  is incoordinate septal motion.  2. Right ventricular systolic function is low normal. The right  ventricular size is normal. There is moderately elevated pulmonary artery  systolic pressure. The estimated right ventricular systolic pressure is  99991111 mmHg.  3. The mitral valve is abnormal. Trivial mitral valve regurgitation.  4. The aortic valve is tricuspid. Aortic valve regurgitation is not  visualized.  5. The inferior vena cava is normal in size with greater than 50%  respiratory variability, suggesting right atrial pressure of 3 mmHg.   LHC 08/09/2019  Ramus lesion is 75% stenosed.  Mid LAD-1 lesion is 80% stenosed.  Mid LAD-2 lesion is 99% stenosed.  Prox RCA lesion is 90% stenosed.  Mid RCA lesion is 25% stenosed.  Prox Cx lesion is 80% stenosed.  LV end diastolic pressure is moderately elevated.  There is no aortic valve stenosis.   Severe three vessel disease.  Will get cardiac surgery opinion.  Diffuse distal vessel disease as well.    His RCA and circumflex are treatable percutaneously.  LAD appears to be a chronic occlusion and may not have a good target for LIMA graft.  He has significant other medical issues as well.  Discussed with Dr. [name].  If he is not a candidate for CABG and if PCI done, urology could wait 30 days post procedure to perform urologic surgery.   Patient Profile  Denali Gotto is a 74 y.o. male with morbid obesity, CKD 3 who was admitted 08/05/2019 for fevers/weakness found to have obstructive uropathy. Underwent percutaneous nephrostomy tube placement 4/2 and developed. 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   Assessment & Plan   1. 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  > 50% of time was spent in direct patient care.    Signed, Addison Naegeli. Audie Box, [place]  08/12/2019 10:18 AM

## 2019-08-12 NOTE — Progress Notes (Signed)
Patient ID: [name], male   DOB: 17-Mar-1946, 74 y.o.   MRN: [id number]  Day of Surgery Subjective: Pt s/p PCI with placement of two cardiac stents today.  Will require uninterrupted dual antiplatelet therapy for 30 days minimum.  Objective: Vital signs in last 24 hours: Temp:  [97.5 F (36.4 C)-99.5 F (37.5 C)] 97.5 F (36.4 C) (04/12 1522) Pulse Rate:  [56-74] 68 (04/12 1522) Resp:  [10-52] 12 (04/12 1223) BP: (121-169)/(76-92) 156/78 (04/12 1522) SpO2:  [95 %-100 %] 96 % (04/12 1522) Weight:  [120.2 kg] 120.2 kg (04/12 0429)  Intake/Output from previous day: 04/11 0701 - 04/12 0700 In: 2322.7 [P.O.:480; I.V.:1165.1; IV Piggyback:677.6] Out: 1850 [Urine:1850] Intake/Output this shift: Total I/O In: 1557.9 [P.O.:240; I.V.:836.7; IV Piggyback:481.2] Out: 275 [Urine:275]  Physical Exam:  General: Alert and oriented GU: Nephrostomy bag draining pink tinged urine  Lab Results: Recent Labs    08/10/19 0328 08/11/19 0215 08/12/19 0359  HGB 14.1 13.0 13.5  HCT 42.2 39.4 41.6   BMET Recent Labs    08/11/19 0215 08/12/19 0359  NA 138 139  K 3.5 4.4  CL 103 104  CO2 25 24  GLUCOSE 95 105*  BUN 15 14  CREATININE 1.18 1.13  CALCIUM 8.3* 8.8*     Studies/Results: CARDIAC CATHETERIZATION  Result Date: 08/12/2019 Conclusions: 1. Severe three-vessel coronary artery disease, as documented on last weeks diagnostic catheterization. 2. Successful IVUS-guided PCI of the proximal LCx using Synergy 3.5 x 16 mm drug-eluting stent with 0% residual stenosis and TIMI-3 flow. 3. Successful IVUS-guided PCI to proximal RCA using Synergy 4.0 x 20 mm drug eluting stent with 0% residual stenosis and TIMI-3 flow.  Recommendations: 1. Dual antiplatelet therapy with aspirin and ticagrelor for at least 12 months, ideally longer.  If possible, DAPT should not be interrupted for at least 1 month unless there is life threatening bleeding or emergent surgery is needed. 2. Aggressive  secondary prevention of coronary artery disease and medical management of chronically occluded LAD. Nelva Bush, MD [facility]    Assessment/Plan: Left ureteral calculus: Discussed plan with patient for management of stone.  Specifically I discussed the option of placement of a nephroureteral catheter or indwelling antegrade stent per IR for more convenience vs option of continuing with nephrostomy tube for now.  Pt elects to continue with nephrostomy for now.  There may be some benefit to considering ureteral stent placement prior to urologic intervention as this may facilitate retrograde procedure for stone treatment.  Will discuss further with patient before he is discharged.  In the meantime, he is aware that we will defer any urologic intervention for at least 4-6 weeks to allow for recovery from recent MI/PCI.  Will coordinate with cardiology to ensure it would be appropriate to proceed at that time.  Pt can absolutely continue ASA 81 mg during the time of his urologic surgery but would need to temporarily stop other antiplatelet agents.  Will communicate and coordinate a plan for him prior to discharge.   LOS: 8 days   [name] 08/12/2019, 5:10 PM

## 2019-08-12 NOTE — H&P (View-Only) (Signed)
Cardiology Progress Note  Patient ID: [name] MRN: [id number] DOB: [birth year] Date of Encounter: 08/12/2019  Primary Cardiologist: Fransico Him, MD  Subjective  No complaints this AM. PCI to RCA/LCX today.   ROS:  All other ROS reviewed and negative. Pertinent positives noted in the HPI.     Inpatient Medications  Scheduled Meds: . [MAR Hold] aspirin EC  81 mg Oral Daily  . [MAR Hold] Chlorhexidine Gluconate Cloth  6 each Topical Daily  . [MAR Hold] heparin  5,000 Units Subcutaneous Q8H  . [MAR Hold] mouth rinse  15 mL Mouth Rinse BID  . [MAR Hold] metoprolol tartrate  50 mg Oral BID  . [MAR Hold] rosuvastatin  40 mg Oral q1800  . sodium chloride flush  3 mL Intravenous Q12H   Continuous Infusions: . [MAR Hold] sodium chloride    . sodium chloride    . sodium chloride 1 mL/kg/hr (08/12/19 0513)  . [MAR Hold] ceFEPime (MAXIPIME) IV 121 mL/hr at 08/12/19 0800   PRN Meds: [MAR Hold] sodium chloride, sodium chloride, [MAR Hold] acetaminophen, [MAR Hold]  morphine injection, [MAR Hold] ondansetron **OR** [MAR Hold] ondansetron (ZOFRAN) IV, [MAR Hold] ondansetron (ZOFRAN) IV, sodium chloride flush   Vital Signs   Vitals:   08/11/19 2040 08/11/19 2228 08/12/19 0429 08/12/19 1017  BP: (!) 147/77  (!) 147/83   Pulse: (!) 57 68 (!) 58 61  Resp: 20  20   Temp: 99.5 F (37.5 C)  98.2 F (36.8 C)   TempSrc: Oral  Oral   SpO2: 98%  98%   Weight:   120.2 kg   Height:        Intake/Output Summary (Last 24 hours) at 08/12/2019 1018 Last data filed at 08/12/2019 0800 Gross per 24 hour  Intake 2443.79 ml  Output 1550 ml  Net 893.79 ml   Last 3 Weights 08/12/2019 08/11/2019 08/10/2019  Weight (lbs) 265 lb 1.6 oz 268 lb 12.8 oz 265 lb 14.4 oz  Weight (kg) 120.249 kg 121.927 kg 120.611 kg      Telemetry  Overnight telemetry shows SB 60s, which I personally reviewed.   Physical Exam   Vitals:   08/11/19 2040 08/11/19 2228 08/12/19 0429 08/12/19 1017  BP: (!) 147/77   (!) 147/83   Pulse: (!) 57 68 (!) 58 61  Resp: 20  20   Temp: 99.5 F (37.5 C)  98.2 F (36.8 C)   TempSrc: Oral  Oral   SpO2: 98%  98%   Weight:   120.2 kg   Height:         Intake/Output Summary (Last 24 hours) at 08/12/2019 1018 Last data filed at 08/12/2019 0800 Gross per 24 hour  Intake 2443.79 ml  Output 1550 ml  Net 893.79 ml    Last 3 Weights 08/12/2019 08/11/2019 08/10/2019  Weight (lbs) 265 lb 1.6 oz 268 lb 12.8 oz 265 lb 14.4 oz  Weight (kg) 120.249 kg 121.927 kg 120.611 kg    Body mass index is 40.31 kg/m.  General: Well nourished, well developed, in no acute distress Head: Atraumatic, normal size  Eyes: PEERLA, EOMI  Neck: Supple, no JVD Endocrine: No thryomegaly Cardiac: Normal S1, S2; RRR; no murmurs, rubs, or gallops Lungs: Clear to auscultation bilaterally, no wheezing, rhonchi or rales  Abd: Soft, nontender, no hepatomegaly  Ext: No edema, pulses 2+ Musculoskeletal: No deformities, BUE and BLE strength normal and equal Skin: Warm and dry, no rashes   Neuro: Alert and oriented to person, place, time, and  situation, CNII-XII grossly intact, no focal deficits  Psych: Normal mood and affect   Labs  High Sensitivity Troponin:   Recent Labs  Lab 08/06/19 1626 08/06/19 1923 08/06/19 2058 08/07/19 1251 08/07/19 1528  TROPONINIHS 589* 1,906* 1,707* 1,234* 1,141*     Cardiac EnzymesNo results for input(s): TROPONINI in the last 168 hours. No results for input(s): TROPIPOC in the last 168 hours.  Chemistry Recent Labs  Lab 08/07/19 0248 08/08/19 0246 08/10/19 0328 08/11/19 0215 08/12/19 0359  NA 138   < > 140 138 139  K 4.0   < > 3.8 3.5 4.4  CL 111   < > 103 103 104  CO2 20*   < > 26 25 24   GLUCOSE 103*   < > 107* 95 105*  BUN 17   < > 15 15 14   CREATININE 1.35*   < > 1.18 1.18 1.13  CALCIUM 8.2*   < > 8.7* 8.3* 8.8*  PROT 5.4*  --   --   --   --   ALBUMIN 2.4*  --   --  2.4*  --   AST 20  --   --   --   --   ALT 15  --   --   --   --   ALKPHOS  77  --   --   --   --   BILITOT 0.9  --   --   --   --   GFRNONAA 51*   < > >60 >60 >60  GFRAA 59*   < > >60 >60 >60  ANIONGAP 7   < > 11 10 11    < > = values in this interval not displayed.    Hematology Recent Labs  Lab 08/10/19 0328 08/11/19 0215 08/12/19 0359  WBC 12.8* 8.8 10.1  RBC 4.95 4.57 4.83  HGB 14.1 13.0 13.5  HCT 42.2 39.4 41.6  MCV 85.3 86.2 86.1  MCH 28.5 28.4 28.0  MCHC 33.4 33.0 32.5  RDW 13.9 14.0 14.0  PLT 298 276 297   BNPNo results for input(s): BNP, PROBNP in the last 168 hours.  DDimer No results for input(s): DDIMER in the last 168 hours.   Radiology  No results found.  Cardiac Studies  TTE 08/07/2019 1. Left ventricular ejection fraction, by estimation, is 45 to 50%. The  left ventricle has low normal function. The left ventricle demonstrates  global hypokinesis. There is mild left ventricular hypertrophy. Left  ventricular diastolic parameters are  consistent with Grade I diastolic dysfunction (impaired relaxation). There  is incoordinate septal motion.  2. Right ventricular systolic function is low normal. The right  ventricular size is normal. There is moderately elevated pulmonary artery  systolic pressure. The estimated right ventricular systolic pressure is  99991111 mmHg.  3. The mitral valve is abnormal. Trivial mitral valve regurgitation.  4. The aortic valve is tricuspid. Aortic valve regurgitation is not  visualized.  5. The inferior vena cava is normal in size with greater than 50%  respiratory variability, suggesting right atrial pressure of 3 mmHg.   LHC 08/09/2019  Ramus lesion is 75% stenosed.  Mid LAD-1 lesion is 80% stenosed.  Mid LAD-2 lesion is 99% stenosed.  Prox RCA lesion is 90% stenosed.  Mid RCA lesion is 25% stenosed.  Prox Cx lesion is 80% stenosed.  LV end diastolic pressure is moderately elevated.  There is no aortic valve stenosis.   Severe three vessel disease.  Will get cardiac surgery opinion.  Diffuse distal vessel disease as well.    His RCA and circumflex are treatable percutaneously.  LAD appears to be a chronic occlusion and may not have a good target for LIMA graft.  He has significant other medical issues as well.  Discussed with Dr. [name].  If he is not a candidate for CABG and if PCI done, urology could wait 30 days post procedure to perform urologic surgery.   Patient Profile  [name] is a 74 y.o. male with morbid obesity, CKD 3 who was admitted 08/05/2019 for fevers/weakness found to have obstructive uropathy. Underwent percutaneous nephrostomy tube placement 4/2 and developed. 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   Assessment & Plan   1. 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  > 50% of time was spent in direct patient care.    Signed, Addison Naegeli. Audie Box, [place]  08/12/2019 10:18 AM

## 2019-08-12 NOTE — Progress Notes (Signed)
0700 Pt first on the add on list for PCI today. NPO since 0000, consent signed, preop fluids running.  1000 Pt to cath lab off monitor. Belongings remain in room. Metoprolol 50mg  held 2/2 HR.  1230 Back to room. Pt alert and in NAD.  1330-1445 R radial TR band taken down without incident.  1630 Pt acutely SOB after getting back in bed from chair. Provided Coke and education on Brillinta side effects and caffeine.  By end of education, pt's breathing regular and unlabored. Pt endorses feeling back to baseline and understanding of education.

## 2019-08-12 NOTE — Progress Notes (Signed)
Pharmacy Antibiotic Note  [name] is a 74 y.o. male with kidney stone presented to the ED on 08/04/2019 with c/o fever and generalized weakness. Pharmacy dosing cefepime (day 7 of antibiotics). Plans noted for 10 days total per urology note -cultures with no growth   Plan: - Continue cefepime 2gm IV q8h - Consider change to cefdinir 300 bid? -Will follow renal function and clinical progress   ________________________________________  Height: 5\' 8"  (172.7 cm) Weight: 120.2 kg (265 lb 1.6 oz) IBW/kg (Calculated) : 68.4  Temp (24hrs), Avg:98.9 F (37.2 C), Min:98.2 F (36.8 C), Max:99.5 F (37.5 C)  Recent Labs  Lab 08/06/19 1434 08/06/19 1626 08/07/19 0248 08/09/19 0521 08/09/19 1803 08/10/19 0328 08/11/19 0215 08/12/19 0359  WBC  --   --    < > 9.1 10.1 12.8* 8.8 10.1  CREATININE  --   --    < > 1.16 1.18 1.18 1.18 1.13  LATICACIDVEN 3.4* 1.9  --   --   --   --   --   --    < > = values in this interval not displayed.    Estimated Creatinine Clearance: 71.2 mL/min (by C-G formula based on SCr of 1.13 mg/dL).    Allergies  Allergen Reactions  . Codeine     Bad Headache     Antimicrobials this admission:  4/5 CTX>> 4/6 4/6 vanc>>4/8 4/6 cefepime>>  Dose adjustments this admission:  --  Microbiology results:  4/5 UA: rare bacteria, large leuk, >50 WBC 4/4 BCx: neg 4/6 BCx x2: neg 4/6 ucx: ngf 4/6 MRSA PCR: neg  Thank you for allowing pharmacy to be a part of this patient's care.  Hildred Laser, PharmD Clinical Pharmacist **Pharmacist phone directory can now be found on [email or web address] ([email or web address]).  Listed under [place].

## 2019-08-12 NOTE — Brief Op Note (Signed)
BRIEF CARDIAC CATHETERIZATION NOTE  08/12/2019  12:33 PM  PATIENT:  Levaughn Woitas  74 y.o. male  PRE-OPERATIVE DIAGNOSIS:  NSTEMI  POST-OPERATIVE DIAGNOSIS:  NSTEMI  PROCEDURE:  Procedure(s): CORONARY STENT INTERVENTION (N/A) Intravascular Ultrasound/IVUS (N/A)  FINDINGS: 1. Severe three-vessel CAD, as documented on last weeks diagnostic catheterization. 2. Successful IVUS-guided PCI of the proximal LCx using Synergy 3.5 x 16 mm DES with 0% residual stenosis and TIMI-3 flow. 3. Successful IVUS-guided PCI to proximal RCA using Synergy 4.0 x 20 mm DES with 0% residual stenosis and TIMI-3 flow.  RECOMMENDATIONS: 1. Dual antiplatelet therapy with aspirin and ticagrelor for at least 12 months, ideally longer.  If possible, DAPT should not be interrupted for at least 1 month unless there is life threatening bleeding or emergent surgery is needed. 2. Aggressive secondary prevention of coronary artery disease.  Nelva Bush, MD [facility]

## 2019-08-12 NOTE — Progress Notes (Addendum)
Assesment/PLAN:  Acute coronary syndrome HFrEF 45-50% 08/2019, RVSP 55  CVTS = not CABG candidate-GDMT ASA 81, ticagrelor 90 twice daily metoprolol 50 twice daily dose  Cardiac catheter = PCI LCx, RCA and cardiology recommends at least 1 month DAPT continue telemetry Patient reports cardiology told him he can go home maybe tomorrow-we will monitor overnight Resolving sepsis secondary to 5 mm obstructing nephrolithiasis with nephrostomy tube currently Painful nephrostomy suture-IR addressed 4/10--pain is about the same per patient Definitive stone management per Dr. [name] likely 6 weeks from PCI-we will CC Today is day 10 of antibiotics and we will discontinue and observe overnight to see if he has any fevers or other issues might need longer course as will be probably 4 to 6 weeks prior to exchange of nephrostomy BPH Reported history but not on meds. 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 March Rummage 2002 Monitor BMI 40, OHSS habitus Needs outpatient weight loss  once cleared by cardiology and discharged likely on 4/13 Currently on heparin for DVT prophylaxis   S 73 white male known history left renal calculi BPH status post attempt ureteroscopy/IR management 02/06/2019 Dr. [name], migraine, CKD 2-3, BMI 40- admit [facility] ED 08/04/2019 generalized weakness fever nausea vomiting T-max 102 blood pressure 150/70 Lactic acid 2.1 CT =5 mm obstructing calculus Underwent percutaneous nephrostomy for 08/20/2019-developed post procedure sepsis, hypoxic respiratory failure Troponin noted +589 cardiology consulted-Echo = abnormal EF 45-50 with high-grade stenosis on cath 4/995% proximal RCA-CT surgery consulted recommending PCI management RCA, LCx   S  Seen after cardiac cath No abdominal pain no nausea no vomiting doing fair no fever no chills no nausea no vomiting No chest pain  O BP (!)  141/88   Pulse 70   Temp 98.2 F (36.8 C) (Oral)   Resp 12   Ht 5\' 8"  (1.727 m)   Wt 120.2 kg   SpO2 98%   BMI 40.31 kg/m   EOMI Mallampati 4 no icterus no pallor S1-S2 no murmur rub or gallop Chest clear no added sound no rales no rhonchi No rebound no guarding ROM intact power 5/5 no focal deficit psych euthymic  Labs/studies reviewed today   BUN/creatinine 14/1.13, potassium 4.4 Calcium 8.8

## 2019-08-13 ENCOUNTER — Telehealth: Payer: Self-pay

## 2019-08-13 LAB — CBC
HCT: 37.5 % — ABNORMAL LOW (ref 39.0–52.0)
Hemoglobin: 12.5 g/dL — ABNORMAL LOW (ref 13.0–17.0)
MCH: 28.5 pg (ref 26.0–34.0)
MCHC: 33.3 g/dL (ref 30.0–36.0)
MCV: 85.6 fL (ref 80.0–100.0)
Platelets: 289 10*3/uL (ref 150–400)
RBC: 4.38 MIL/uL (ref 4.22–5.81)
RDW: 14 % (ref 11.5–15.5)
WBC: 8.8 10*3/uL (ref 4.0–10.5)
nRBC: 0 % (ref 0.0–0.2)

## 2019-08-13 LAB — RENAL FUNCTION PANEL
Albumin: 2.4 g/dL — ABNORMAL LOW (ref 3.5–5.0)
Anion gap: 9 (ref 5–15)
BUN: 12 mg/dL (ref 8–23)
CO2: 23 mmol/L (ref 22–32)
Calcium: 8.2 mg/dL — ABNORMAL LOW (ref 8.9–10.3)
Chloride: 104 mmol/L (ref 98–111)
Creatinine, Ser: 0.97 mg/dL (ref 0.61–1.24)
GFR calc Af Amer: >60 mL/min (ref >60)
GFR calc non Af Amer: >60 mL/min (ref >60)
Glucose, Bld: 115 mg/dL — ABNORMAL HIGH (ref 70–99)
Phosphorus: 2.1 mg/dL — ABNORMAL LOW (ref 2.5–4.6)
Potassium: 3.5 mmol/L (ref 3.5–5.1)
Sodium: 136 mmol/L (ref 135–145)

## 2019-08-13 MED ORDER — TICAGRELOR 90 MG PO TABS: 90.0000 mg | ORAL_TABLET | Freq: Two times a day (BID) | ORAL | 11 refills | Status: DC

## 2019-08-13 MED ORDER — METOPROLOL SUCCINATE ER 50 MG PO TB24: 50.0000 mg | ORAL_TABLET | Freq: Every day | ORAL | 11 refills | Status: DC

## 2019-08-13 MED ORDER — METOPROLOL SUCCINATE ER 50 MG PO TB24
50.0000 mg | ORAL_TABLET | Freq: Every day | ORAL | Status: DC
  Filled 2019-08-13: qty 1

## 2019-08-13 MED ORDER — LISINOPRIL 5 MG PO TABS: 5.0000 mg | ORAL_TABLET | Freq: Every day | ORAL | 11 refills | Status: DC

## 2019-08-13 MED ORDER — ASPIRIN 81 MG PO TBEC: 81.0000 mg | DELAYED_RELEASE_TABLET | Freq: Every day | ORAL | 11 refills | Status: AC

## 2019-08-13 MED ORDER — LISINOPRIL 5 MG PO TABS
5.0000 mg | ORAL_TABLET | Freq: Every day | ORAL | Status: DC
  Administered 2019-08-13: 5 mg via ORAL
  Filled 2019-08-13: qty 1

## 2019-08-13 MED ORDER — ROSUVASTATIN CALCIUM 40 MG PO TABS: 40.0000 mg | ORAL_TABLET | Freq: Every day | ORAL | 0 refills | Status: DC

## 2019-08-13 NOTE — Progress Notes (Signed)
Cardiology Progress Note  Patient ID: Coben Selvage MRN: [id number] DOB: 07/14/1945 Date of Encounter: 08/13/2019  Primary Cardiologist: Fransico Him, MD  Subjective  S/p PCI to RCA/LCX. Cath site looks good. No complaints.   ROS:  All other ROS reviewed and negative. Pertinent positives noted in the HPI.     Inpatient Medications  Scheduled Meds: . aspirin EC  81 mg Oral Daily  . Chlorhexidine Gluconate Cloth  6 each Topical Daily  . enoxaparin (LOVENOX) injection  40 mg Subcutaneous Q24H  . lisinopril  5 mg Oral Daily  . mouth rinse  15 mL Mouth Rinse BID  . metoprolol tartrate  50 mg Oral BID  . rosuvastatin  40 mg Oral q1800  . sodium chloride flush  3 mL Intravenous Q12H  . ticagrelor  90 mg Oral BID   Continuous Infusions: . sodium chloride    . sodium chloride     PRN Meds: sodium chloride, sodium chloride, acetaminophen, morphine injection, ondansetron **OR** ondansetron (ZOFRAN) IV, ondansetron (ZOFRAN) IV, sodium chloride flush   Vital Signs   Vitals:   08/12/19 1522 08/12/19 2011 08/13/19 0433 08/13/19 0507  BP: (!) 156/78 127/86 (!) 152/72 139/84  Pulse: 68 72 (!) 58 72  Resp: 16 (!) 25  (!) 25  Temp: (!) 97.5 F (36.4 C) 98.2 F (36.8 C)  98.2 F (36.8 C)  TempSrc: Oral Oral  Oral  SpO2: 96% 98% 97% 98%  Weight:    121 kg  Height:        Intake/Output Summary (Last 24 hours) at 08/13/2019 0937 Last data filed at 08/13/2019 0508 Gross per 24 hour  Intake 1586.86 ml  Output 975 ml  Net 611.86 ml   Last 3 Weights 08/13/2019 08/12/2019 08/11/2019  Weight (lbs) 266 lb 12.1 oz 265 lb 1.6 oz 268 lb 12.8 oz  Weight (kg) 121 kg 120.249 kg 121.927 kg      Telemetry  Overnight telemetry shows SB 60s, which I personally reviewed.   EKG - NSR 61 bpm  Physical Exam   Vitals:   08/12/19 1522 08/12/19 2011 08/13/19 0433 08/13/19 0507  BP: (!) 156/78 127/86 (!) 152/72 139/84  Pulse: 68 72 (!) 58 72  Resp: 16 (!) 25  (!) 25  Temp: (!) 97.5 F (36.4  C) 98.2 F (36.8 C)  98.2 F (36.8 C)  TempSrc: Oral Oral  Oral  SpO2: 96% 98% 97% 98%  Weight:    121 kg  Height:         Intake/Output Summary (Last 24 hours) at 08/13/2019 0937 Last data filed at 08/13/2019 0508 Gross per 24 hour  Intake 1586.86 ml  Output 975 ml  Net 611.86 ml    Last 3 Weights 08/13/2019 08/12/2019 08/11/2019  Weight (lbs) 266 lb 12.1 oz 265 lb 1.6 oz 268 lb 12.8 oz  Weight (kg) 121 kg 120.249 kg 121.927 kg    Body mass index is 40.56 kg/m.   General: Well nourished, well developed, in no acute distress Head: Atraumatic, normal size  Eyes: PEERLA, EOMI  Neck: Supple, no JVD Endocrine: No thryomegaly Cardiac: Normal S1, S2; RRR; no murmurs, rubs, or gallops Lungs: Clear to auscultation bilaterally, no wheezing, rhonchi or rales  Abd: Soft, nontender, no hepatomegaly  Ext: No edema, pulses 2+, R radial cath site clean dry without hematoma  Musculoskeletal: No deformities, BUE and BLE strength normal and equal Skin: Warm and dry, no rashes   Neuro: Alert and oriented to person, place, time, and situation,  CNII-XII grossly intact, no focal deficits  Psych: Normal mood and affect   Labs  High Sensitivity Troponin:   Recent Labs  Lab 08/06/19 1626 08/06/19 1923 08/06/19 2058 08/07/19 1251 08/07/19 1528  TROPONINIHS 589* 1,906* 1,707* 1,234* 1,141*     Cardiac EnzymesNo results for input(s): TROPONINI in the last 168 hours. No results for input(s): TROPIPOC in the last 168 hours.  Chemistry Recent Labs  Lab 08/07/19 0248 08/08/19 0246 08/11/19 0215 08/12/19 0359 08/13/19 0354  NA 138   < > 138 139 136  K 4.0   < > 3.5 4.4 3.5  CL 111   < > 103 104 104  CO2 20*   < > 25 24 23   GLUCOSE 103*   < > 95 105* 115*  BUN 17   < > 15 14 12   CREATININE 1.35*   < > 1.18 1.13 0.97  CALCIUM 8.2*   < > 8.3* 8.8* 8.2*  PROT 5.4*  --   --   --   --   ALBUMIN 2.4*  --  2.4*  --  2.4*  AST 20  --   --   --   --   ALT 15  --   --   --   --   ALKPHOS 77  --    --   --   --   BILITOT 0.9  --   --   --   --   GFRNONAA 51*   < > >60 >60 >60  GFRAA 59*   < > >60 >60 >60  ANIONGAP 7   < > 10 11 9    < > = values in this interval not displayed.    Hematology Recent Labs  Lab 08/11/19 0215 08/12/19 0359 08/13/19 0354  WBC 8.8 10.1 8.8  RBC 4.57 4.83 4.38  HGB 13.0 13.5 12.5*  HCT 39.4 41.6 37.5*  MCV 86.2 86.1 85.6  MCH 28.4 28.0 28.5  MCHC 33.0 32.5 33.3  RDW 14.0 14.0 14.0  PLT 276 297 289   BNPNo results for input(s): BNP, PROBNP in the last 168 hours.  DDimer No results for input(s): DDIMER in the last 168 hours.   Radiology  CARDIAC CATHETERIZATION  Result Date: 08/12/2019 Conclusions: 1. Severe three-vessel coronary artery disease, as documented on last weeks diagnostic catheterization. 2. Successful IVUS-guided PCI of the proximal LCx using Synergy 3.5 x 16 mm drug-eluting stent with 0% residual stenosis and TIMI-3 flow. 3. Successful IVUS-guided PCI to proximal RCA using Synergy 4.0 x 20 mm drug eluting stent with 0% residual stenosis and TIMI-3 flow.  Recommendations: 1. Dual antiplatelet therapy with aspirin and ticagrelor for at least 12 months, ideally longer.  If possible, DAPT should not be interrupted for at least 1 month unless there is life threatening bleeding or emergent surgery is needed. 2. Aggressive secondary prevention of coronary artery disease and medical management of chronically occluded LAD. Nelva Bush, MD [facility]    Cardiac Studies  TTE 08/07/2019 1. Left ventricular ejection fraction, by estimation, is 45 to 50%. The  left ventricle has low normal function. The left ventricle demonstrates  global hypokinesis. There is mild left ventricular hypertrophy. Left  ventricular diastolic parameters are  consistent with Grade I diastolic dysfunction (impaired relaxation). There  is incoordinate septal motion.  2. Right ventricular systolic function is low normal. The right  ventricular size is normal.  There is moderately elevated pulmonary artery  systolic pressure. The estimated right ventricular systolic pressure is  55.7 mmHg.  3. The mitral valve is abnormal. Trivial mitral valve regurgitation.  4. The aortic valve is tricuspid. Aortic valve regurgitation is not  visualized.  5. The inferior vena cava is normal in size with greater than 50%  respiratory variability, suggesting right atrial pressure of 3 mmHg.   LHC 08/09/2019  Ramus lesion is 75% stenosed.  Mid LAD-1 lesion is 80% stenosed.  Mid LAD-2 lesion is 99% stenosed.  Prox RCA lesion is 90% stenosed.  Mid RCA lesion is 25% stenosed.  Prox Cx lesion is 80% stenosed.  LV end diastolic pressure is moderately elevated.  There is no aortic valve stenosis.   Severe three vessel disease.  Will get cardiac surgery opinion.  Diffuse distal vessel disease as well.    His RCA and circumflex are treatable percutaneously.  LAD appears to be a chronic occlusion and may not have a good target for LIMA graft.  He has significant other medical issues as well.  Discussed with Dr. [name].  If he is not a candidate for CABG and if PCI done, urology could wait 30 days post procedure to perform urologic surgery.   Patient Profile  Vinson Simonetti is a 74 y.o. male with morbid obesity, CKD 3 who was admitted 08/05/2019 for fevers/weakness found to have obstructive uropathy. Underwent percutaneous nephrostomy tube placement 4/2 and developed. 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   Assessment & Plan   1. 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 Ischemic Cardiomyopathy -EF 45-50% -no symptoms of CHF -5 mg lisinopril added  today -metoprolol switched to succinate 50 mg QD   [facility] will sign off.   Medication Recommendations: metoprolol succinate 50 mg qd, lisinopril 5 mg QD, lasix 20 mg QD PRN for LE edema, aspirin 81 mg QD, brilinta 90 mg BID, crestor 40 mg QD  Other recommendations (labs, testing, etc):  Uninterrupted DAPT for 6 weeks and then may interrupt for urological procedure  Follow up as an outpatient:  We will arrange 1 week post PCI appointment   For questions or updates, please contact [facility] Please consult [email or web address] for contact info under   Time Spent with Patient: I have spent a total of 25 minutes with patient reviewing hospital notes, telemetry, EKGs, labs and examining the patient as well as establishing an assessment and plan that was discussed with the patient.  > 50% of time was spent in direct patient care.    Signed, Addison Naegeli. Audie Box, [place]  08/13/2019 9:37 AM

## 2019-08-13 NOTE — Progress Notes (Signed)
CARDIAC REHAB PHASE I   PRE:  Rate/Rhythm: 60 SR    BP: sitting 149/82    SaO2: 98 RA  MODE:  Ambulation: 400 ft   POST:  Rate/Rhythm: 80  SR    BP: sitting 155/80     SaO2: 98 RA  Slow steady pace. No c/o. Has bad ankle from old injury. Discussed Brilinta, restrictions, MI, stents, diet, exercise, NTG and CRPII. Will refer to [place].  He understands his Brilinta importance. Encouraged low sodium to help with fluid retention. He sts his legs have been swollen since age 74. [phone]  [place], ACSM 08/13/2019 8:58 AM

## 2019-08-13 NOTE — Progress Notes (Deleted)
{Choose 1 Note Type (Video or Telephone):[phone]}   The patient was identified using 2 identifiers.  Date:  08/13/2019   ID:  [name], DOB 1945/11/29, MRN [id number]  {Patient [phone]::"Home"} {Provider Location:[phone]::"Home"}  PCP:  [name], MD  Cardiologist:  Fransico Him, MD  Electrophysiologist:  None   Evaluation Performed:  {Choose Visit [id number] Visit"}  Chief Complaint:  Hospital follow up  History of Present Illness:    [name] is a 74 y.o. male with a hx of chronic migraines, CKD stage III, morbid obesity and nephrolithiasis who was recently admitted to [facility] for fever and weakness found to have an elevated WBC of 17.2 and obstructive uropathy with hydronephrosis. He underwent left sided nephrostomy tube placement and post procedure became tachypneic with rigors and hypertensive emergency with BP 225/170mmg Hg and HR 126 with elevated procalcitonin at 71, acidosis, hypoxemic with mottled appearance and was treated with IVF and morphine with improvement.  Cxray was normal and it was felt that his decompensation was related to sepsis.  Trop though was elevated at 589>>1906>>1707 with no acute changes on EKG and no CP. Unfortunately his echo showed an EF of 45-50%. Given this, LHC was recommended which was performed on 08/09/19 that showed severe three vessel CAD. TCTS consultation was recommended however he was turned down and therefore he underwent a staged intervention 08/13/19.    Medication plan as follows: metoprolol succinate 50 mg qd, lisinopril 5 mg QD, lasix 20 mg QD PRN for LE edema, aspirin 81 mg QD, brilinta 90 mg BID, crestor 40 mg QD  Other recommendations (labs, testing, etc):  Uninterrupted DAPT for 6 weeks and then may interrupt for urological procedure  Follow up as an outpatient:  We will arrange 1 week post PCI appointment     1. CAD s/p PCI to 90% pRCA, 80% pLCX 08/13/19: - HStrop  589>1906>1707>1234 - 2D echo with mild LV dysfunction with EF 45-50% with global HK - likely demand ischemia in the setting of  sepsis, hypertensive emergency, hypoxemia, AKI and underlying CAD - Patient has not had chest pain. EKG with out ischemic changes - heart cath Friday showing severe 3V CAD with 90% pRCA, 80% pLCx, 75% RI in a small vessel, 80% mid LAD and subtotalled mid LAD that appears chronic.   - CVTS has seen patient and feels poor candidate for CABG and recommends staged PCI of the RCA and LCx - LDL 73 and TAG 234, HbA1C 5.8% - continue ASA 81mg  daily, BB and add statin - plan for PCI RCA and LCx tomorrow  2. Ischemic Cardiomyopathy -EF 45-50% -no symptoms of CHF -will plan to add ACE after cath   Hypertensive emergency - BP controlled at 120/52mmHg - Continue Lopressor to 50mg  BID  - would benefit from ARB which we will start after PCI due to CKD  AKI/urosepsis/hydronephrosis - creatinine baseline 1.5 - today level at 1.18  Hypokalemia -K+ 3.8 -replete to keep K+>4  HLD -LDL goal < 70 and LDL was 73 this admit -started Crestor 10mg  daily -will need FLP and ALT in 6 weeks     The patient {does/does not:200015} have symptoms concerning for COVID-19 infection (fever, chills, cough, or new shortness of breath).    Past Medical History:  Diagnosis Date  . Chronic kidney disease    RENAL STONES  . High frequency hearing loss   . History of migraine headaches   . Hx of colonic polyps   . Obesity   . Seborrheic dermatitis   .  Vitiligo    Past Surgical History:  Procedure Laterality Date  . COLON SURGERY  01/2001  . CORONARY STENT INTERVENTION N/A 08/12/2019   Procedure: CORONARY STENT INTERVENTION;  Surgeon: Nelva Bush, MD;  Location: [place] [facility];  Service: Cardiovascular;  Laterality: N/A;  . CYSTOSCOPY/URETEROSCOPY/HOLMIUM LASER/STENT PLACEMENT Left 02/04/2019   Procedure: CYSTOSCOPY Left retrograde and attempted left ureteroscopy;   Surgeon: Raynelle Bring, MD;  Location: [facility];  Service: Urology;  Laterality: Left;  ONLY NEEDS 60 MIN  . FRACTURE SURGERY    . INTRAVASCULAR ULTRASOUND/IVUS N/A 08/12/2019   Procedure: Intravascular Ultrasound/IVUS;  Surgeon: Nelva Bush, MD;  Location: [place] [facility];  Service: Cardiovascular;  Laterality: N/A;  . IR NEPHROSTOMY PLACEMENT LEFT  02/05/2019  . IR NEPHROSTOMY PLACEMENT LEFT  08/06/2019  . LEFT HEART CATH AND CORONARY ANGIOGRAPHY N/A 08/09/2019   Procedure: LEFT HEART CATH AND CORONARY ANGIOGRAPHY;  Surgeon: Jettie Booze, MD;  Location: [place] [facility];  Service: Cardiovascular;  Laterality: N/A;  . TONSILLECTOMY AND ADENOIDECTOMY       No outpatient medications have been marked as taking for the 08/20/19 encounter (Appointment) with Tommie Raymond, NP.     Allergies:   Codeine   Social History   Tobacco Use  . Smoking status: Former Smoker    Packs/day: 1.00    Years: 40.00    Pack years: 40.00    Types: Cigarettes    Quit date: 1997    Years since quitting: 24.2  . Smokeless tobacco: Never Used  Substance Use Topics  . Alcohol use: Not Currently  . Drug use: Never     Family Hx: The patient's family history is not on file.  ROS:   Please see the history of present illness.    *** All other systems reviewed and are negative.  Prior CV studies:   The following studies were reviewed today:  TTE 08/07/2019  1. Left ventricular ejection fraction, by estimation, is 45 to 50%. The  left ventricle has low normal function. The left ventricle demonstrates  global hypokinesis. There is mild left ventricular hypertrophy. Left  ventricular diastolic parameters are  consistent with Grade I diastolic dysfunction (impaired relaxation). There  is incoordinate septal motion.  2. Right ventricular systolic function is low normal. The right  ventricular size is normal. There is moderately elevated pulmonary artery  systolic pressure. The estimated  right ventricular systolic pressure is  99991111 mmHg.  3. The mitral valve is abnormal. Trivial mitral valve regurgitation.  4. The aortic valve is tricuspid. Aortic valve regurgitation is not  visualized.  5. The inferior vena cava is normal in size with greater than 50%  respiratory variability, suggesting right atrial pressure of 3 mmHg.   LHC 08/09/2019  Ramus lesion is 75% stenosed.  Mid LAD-1 lesion is 80% stenosed.  Mid LAD-2 lesion is 99% stenosed.  Prox RCA lesion is 90% stenosed.  Mid RCA lesion is 25% stenosed.  Prox Cx lesion is 80% stenosed.  LV end diastolic pressure is moderately elevated.  There is no aortic valve stenosis.  Severe three vessel disease. Will get cardiac surgery opinion. Diffuse distal vessel disease as well.   His RCA and circumflex are treatable percutaneously. LAD appears to be a chronic occlusion and may not have a good target for LIMA graft. He has significant other medical issues as well. Discussed with Dr. [name]. If he is not a candidate for CABG and if PCI done, urology could wait 30 days post  procedure to perform urologic surgery.   Labs/Other Tests and Data Reviewed:    EKG:  {EKG/Telemetry Strips [phone]}  Recent Labs: 08/07/2019: ALT 15 08/13/2019: BUN 12; Creatinine, Ser 0.97; Hemoglobin 12.5; Platelets 289; Potassium 3.5; Sodium 136   Recent Lipid Panel Lab Results  Component Value Date/Time   CHOL 135 08/09/2019 05:21 AM   TRIG 234 (H) 08/09/2019 05:21 AM   HDL 15 (L) 08/09/2019 05:21 AM   CHOLHDL 9.0 08/09/2019 05:21 AM   LDLCALC 73 08/09/2019 05:21 AM    Wt Readings from Last 3 Encounters:  08/13/19 266 lb 12.1 oz (121 kg)  02/04/19 284 lb 2.8 oz (128.9 kg)  01/31/19 282 lb 2 oz (128 kg)     Objective:    Vital Signs:  There were no vitals taken for this visit.   {[facility] Virtual Exam (Optional):[phone]::"VITAL SIGNS:  reviewed"}  ASSESSMENT & PLAN:    1. ***  COVID-19  Education: The signs and symptoms of COVID-19 were discussed with the patient and how to seek care for testing (follow up with PCP or arrange E-visit).  ***The importance of social distancing was discussed today.  Time:   Today, I have spent *** minutes with the patient with telehealth technology discussing the above problems.     Medication Adjustments/Labs and Tests Ordered: Current medicines are reviewed at length with the patient today.  Concerns regarding medicines are outlined above.   Tests Ordered: No orders of the defined types were placed in this encounter.   Medication Changes: No orders of the defined types were placed in this encounter.   Follow Up:  {F/U Format:[phone]} {follow up:15908}  Signed, Kathyrn Drown, NP  08/13/2019 3:38 PM    [facility]

## 2019-08-13 NOTE — Discharge Summary (Signed)
Physician Discharge Summary  Fordyce Elm [id number] DOB: Feb 28, 1946 DOA: 08/04/2019  PCP: [name], MD  Admit date: 08/04/2019 Discharge date: 08/13/2019  Time spent: 40 minutes  Recommendations for Outpatient Follow-up:  1. Outpatient follow-up already scheduled cardiology, urology and this will be coordinated by their care teams as will need surgery in 6 weeks and will need to come off of Brilinta-continue aspirin at this time which is a new medicine 2. Recommend  Discharge Diagnoses:  Principal Problem:   infected Renal calculus, left Active Problems:   AKI (acute kidney injury) ([place])   Hypokalemia   Hypertensive emergency   Elevated troponin   Leg edema   Non-ST elevation (NSTEMI) myocardial infarction [facility])   Coronary artery disease due to lipid rich plaque   Discharge Condition: Improved  Diet recommendation: Heart healthy diabetic  Filed Weights   08/11/19 0600 08/12/19 0429 08/13/19 0507  Weight: 121.9 kg 120.2 kg 121 kg    History of present illness:  51 white male known history left renal calculi BPH status post attempt ureteroscopy/IR management 02/06/2019 Dr. 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 Definitive stone management per Dr. [name] likely 6 weeks from PCI-appreciate his ongoing input- placement etc. BPH Reported history but not on meds.  Monitor as an outpatient Acute superimposed on chronic CKD 2-3 Labs look improved from prior acidosis is resolved Volumes today are even Prior colectomy for large polyp Dr. March Rummage 2002 Monitor BMI 40, OHSS habitus Needs outpatient weight loss  Procedures:  Cardiac cath Conclusions: 1. Severe three-vessel coronary artery disease, as documented on last weeks diagnostic catheterization. 2. Successful IVUS-guided PCI of the proximal LCx using Synergy 3.5 x 16 mm drug-eluting stent with 0% residual stenosis and TIMI-3 flow. 3. Successful IVUS-guided PCI to proximal RCA using Synergy 4.0 x 20 mm drug eluting stent with 0% residual stenosis and TIMI-3 flow.  Consultations:  Cardiology, urology  Discharge Exam: Vitals:   08/13/19 0507 08/13/19 0830  BP: 139/84 (!) 155/80  Pulse: 72   Resp: (!) 25 (!) 21  Temp: 98.2 F (36.8 C)   SpO2: 98%     General: Awake coherent alert no distress sitting up in bed walked 400 feet this morning with therapy Cardiovascular: S1-S2 sinus, sinus bradycardia Respiratory: Clinically clear no added sound no rales no rhonchi Abdomen soft no rebound no guarding Chronic lower extremity edema with some pain in the left lower extremity-again chronic ROM intact no focal deficit  Discharge Instructions   Discharge Instructions    Amb Referral to Cardiac Rehabilitation   Complete by: As directed    Diagnosis:  Coronary Stents PTCA NSTEMI  After initial evaluation and assessments completed: Virtual Based Care may be provided alone or in conjunction with Phase 2 Cardiac  Rehab based on patient barriers.: Yes   Diet - low sodium heart healthy   Complete by: As directed    Discharge instructions   Complete by: As directed    Take aspirin and Brilinta as directed and this will be coordinated as an outpatient with your primary physician and your cardiologist in terms of when to discontinue the Brilinta prior to your urological surgery Report any fevers chills shortness of breath bleeding out of the ordinary and if necessary you may need to come to the emergency room Recommend outpatient cardiac rehab and follow-up Needs home health for nephrostomy care and assistance with the same Needs Chem-12 and CBC in about 1 to 2 weeks   Increase activity slowly   Complete by: As directed      Allergies as of 08/13/2019      Reactions   Codeine    Bad Headache      Medication List    TAKE these medications   aspirin 81 MG EC tablet Take 1 tablet (81 mg total) by mouth daily. Start taking on: August 14, 2019   lisinopril 5 MG tablet Commonly known as: ZESTRIL Take 1 tablet (5 mg total) by mouth daily. Start taking on: August 14, 2019   metoprolol succinate 50 MG 24 hr tablet Commonly known as: TOPROL-XL Take 1 tablet (50 mg total) by mouth daily. Take with or immediately following a meal.   rosuvastatin 40 MG tablet Commonly known as: CRESTOR Take 1 tablet (40 mg total) by mouth daily at 6 PM.   ticagrelor 90 MG Tabs tablet Commonly known as: BRILINTA Take 1 tablet (90 mg total) by mouth 2 (two) times daily.      Allergies  Allergen Reactions  . Codeine     Bad Headache   Follow-up Information    Tommie Raymond, NP Follow up on 08/20/2019.   Specialty: Cardiology Why: Hospital follow-up 4/20 at 2:15 PM Contact information: [place] [address] [phone]            The results of significant diagnostics from this hospitalization (including imaging, microbiology, ancillary and laboratory) are listed below for  reference.    Significant Diagnostic Studies: CARDIAC CATHETERIZATION  Result Date: 08/12/2019 Conclusions: 1. Severe three-vessel coronary artery disease, as documented on last weeks diagnostic catheterization. 2. Successful IVUS-guided PCI of the proximal LCx using Synergy 3.5 x 16 mm drug-eluting stent with 0% residual stenosis and TIMI-3 flow. 3. Successful IVUS-guided PCI to proximal RCA using Synergy 4.0 x 20 mm drug eluting stent with 0% residual stenosis and TIMI-3 flow.  Recommendations: 1. Dual antiplatelet therapy with aspirin and ticagrelor for at least 12 months, ideally longer.  If possible, DAPT should not be interrupted for at least 1 month unless there is life threatening bleeding or emergent surgery is needed. 2. Aggressive secondary prevention of coronary artery disease and medical management of chronically occluded LAD. Nelva Bush, MD [facility]   CARDIAC CATHETERIZATION  Addendum Date: 08/09/2019    Ramus lesion is 75% stenosed.  Mid LAD-1 lesion is 80% stenosed.  Mid LAD-2 lesion is 99% stenosed.  Prox RCA lesion is 90% stenosed.  Mid RCA lesion is 25% stenosed.  Prox Cx lesion is 80% stenosed.  LV end diastolic pressure is moderately elevated.  There is no aortic valve stenosis.  Severe three vessel disease.  Will get cardiac surgery opinion.  Diffuse distal vessel disease as well.  His RCA and circumflex are treatable percutaneously.  LAD appears to be a chronic occlusion and may not have a good target for LIMA graft.  He has significant other medical issues as well.  Discussed with Dr. [name].  If he is not a candidate for CABG and if PCI done, urology could wait 30 days post procedure to perform urologic surgery.   Result Date: 08/09/2019  Ramus lesion is 75% stenosed.  Mid LAD-1 lesion is 80% stenosed.  Mid LAD-2 lesion is 99% stenosed.  Prox RCA lesion is 90% stenosed.  Mid RCA lesion is 25% stenosed.  Prox Cx lesion is 80% stenosed.  LV end diastolic  pressure is moderately elevated.  There is no aortic valve stenosis.  Severe three vessel disease.  Will get cardiac surgery opinion.  Diffuse distal vessel disease as well.  His RCA and circumflex are treatable percutaneously.  LAD appears to be a chronic occlusion and may not have a good target for LIMA graft.  He has significant other medical issues as well.  Discussed with Dr. [name].  If PCI done, they could wait 30 days post procedure to perform urologic surgery.   DG CHEST PORT 1 VIEW  Result Date: 08/06/2019 CLINICAL DATA:  Dyspnea today. EXAM: PORTABLE CHEST 1 VIEW COMPARISON:  Single-view of the chest 08/04/2019. FINDINGS: Lungs clear. Heart size is normal. No pneumothorax or pleural effusion. No acute or focal bony abnormality. IMPRESSION: No acute disease. Electronically Signed   By: Inge Rise M.D.   On: 08/06/2019 13:56   DG Chest Portable 1 View  Result Date: 08/04/2019 CLINICAL DATA:  Fever EXAM: PORTABLE CHEST 1 VIEW COMPARISON:  06/09/2005 FINDINGS: Cardiomegaly. No confluent opacities, effusions or edema. No acute bony abnormality. IMPRESSION: Cardiomegaly.  No active disease. Electronically Signed   By: Rolm Baptise M.D.   On: 08/04/2019 17:43   DG Abd Portable 1V  Result Date: 08/06/2019 CLINICAL DATA:  Tachypnea nephrostomy placement EXAM: PORTABLE ABDOMEN - 1 VIEW COMPARISON:  August 04, 2019 FINDINGS: Nephrostomy placed on the left. Moderate stool in colon. No bowel dilatation or air-fluid level to suggest bowel obstruction. No evident free air. IMPRESSION: Nephrostomy catheter on left. No bowel obstruction or free air evident on supine examination. Electronically Signed   By: Lowella Grip III M.D.   On: 08/06/2019 13:54   DG Abd Portable 1 View  Result Date: 08/04/2019 CLINICAL DATA:  Fever, renal stone EXAM: PORTABLE ABDOMEN - 1 VIEW COMPARISON:  CT 02/05/2019 FINDINGS: Study limited by body habitus. Calcification projects over the left abdomen, likely left renal stone  or possibly the previously seen left renal pelvic stone. No definite calcifications over the course of the ureters although small stones may be difficult to visualize. Nonobstructive bowel gas pattern. No organomegaly. IMPRESSION: Limited study due to body habitus. Calcification in the left abdomen could reflect the previously seen left renal or renal pelvic stone. Electronically Signed   By: Rolm Baptise M.D.   On: 08/04/2019 17:45   ECHOCARDIOGRAM COMPLETE  Result Date: 08/08/2019    ECHOCARDIOGRAM REPORT   Patient Name:   HOVANES FANTA Date of Exam: 08/07/2019 Medical Rec #:  [id number]         Height:       68.0 in Accession #:    [id number]        Weight:       265.4 lb Date of Birth:  [birth year]  BSA:          2.305 m Patient Age:    73 years          BP:           122/74 mmHg Patient Gender: M                 HR:           71 bpm. Exam Location:  Inpatient Procedure: 2D Echo, Cardiac Doppler and Color Doppler Indications:     Elevated Troponin  History:         Patient has no prior history of Echocardiogram examinations.                  Morbid obesity; Risk Factors:Hypertension. AKI, sepsis, CKD,                  elevated Troponin.  Sonographer:     Dustin Flock Referring Phys:  [place] Diagnosing Phys: Lyman Bishop MD  Sonographer Comments: Patient is morbidly obese. IMPRESSIONS  1. Left ventricular ejection fraction, by estimation, is 45 to 50%. The left ventricle has low normal function. The left ventricle demonstrates global hypokinesis. There is mild left ventricular hypertrophy. Left ventricular diastolic parameters are consistent with Grade I diastolic dysfunction (impaired relaxation). There is incoordinate septal motion.  2. Right ventricular systolic function is low normal. The right ventricular size is normal. There is moderately elevated pulmonary artery systolic pressure. The estimated right ventricular systolic pressure is 99991111 mmHg.  3. The mitral valve is  abnormal. Trivial mitral valve regurgitation.  4. The aortic valve is tricuspid. Aortic valve regurgitation is not visualized.  5. The inferior vena cava is normal in size with greater than 50% respiratory variability, suggesting right atrial pressure of 3 mmHg. FINDINGS  Left Ventricle: Left ventricular ejection fraction, by estimation, is 45 to 50%. The left ventricle has low normal function. The left ventricle demonstrates global hypokinesis. The left ventricular internal cavity size was normal in size. There is mild left ventricular hypertrophy. Incoordinate septal motion and abnormal (paradoxical) septal motion, consistent with left bundle branch block. Left ventricular diastolic parameters are consistent with Grade I diastolic dysfunction (impaired relaxation). Indeterminate filling pressures. Right Ventricle: The right ventricular size is normal. No increase in right ventricular wall thickness. Right ventricular systolic function is low normal. There is moderately elevated pulmonary artery systolic pressure. The tricuspid regurgitant velocity  is 3.63 m/s, and with an assumed right atrial pressure of 3 mmHg, the estimated right ventricular systolic pressure is 99991111 mmHg. Left Atrium: Left atrial size was normal in size. Right Atrium: Right atrial size was normal in size. Pericardium: There is no evidence of pericardial effusion. Mitral Valve: The mitral valve is abnormal. There is mild thickening of the mitral valve leaflet(s). Mild mitral annular calcification. Trivial mitral valve regurgitation. Tricuspid Valve: The tricuspid valve is grossly normal. Tricuspid valve regurgitation is mild. Aortic Valve: The aortic valve is tricuspid. Aortic valve regurgitation is not visualized. Pulmonic Valve: The pulmonic valve was normal in structure. Pulmonic valve regurgitation is not visualized. Aorta: The aortic root and ascending aorta are structurally normal, with no evidence of dilitation. Venous: The inferior vena  cava is normal in size with greater than 50% respiratory variability, suggesting right atrial pressure of 3 mmHg. IAS/Shunts: No atrial level shunt detected by color flow Doppler.  LEFT VENTRICLE PLAX 2D LVIDd:         5.00 cm  Diastology LVIDs:  3.70 cm  LV e' lateral:   9.57 cm/s LV PW:         1.20 cm  LV E/e' lateral: 6.1 LV IVS:        1.20 cm  LV e' medial:    7.18 cm/s LVOT diam:     2.60 cm  LV E/e' medial:  8.1 LV SV:         103 LV SV Index:   45 LVOT Area:     5.31 cm  RIGHT VENTRICLE RV Basal diam:  3.20 cm RV S prime:     10.30 cm/s TAPSE (M-mode): 2.4 cm LEFT ATRIUM             Index       RIGHT ATRIUM           Index LA diam:        4.10 cm 1.78 cm/m  RA Area:     18.50 cm LA Vol (A2C):   47.3 ml 20.52 ml/m RA Volume:   63.00 ml  27.33 ml/m LA Vol (A4C):   46.3 ml 20.09 ml/m LA Biplane Vol: 48.4 ml 21.00 ml/m  AORTIC VALVE LVOT Vmax:   93.30 cm/s LVOT Vmean:  57.700 cm/s LVOT VTI:    0.194 m  AORTA Ao Root diam: 3.10 cm MITRAL VALVE               TRICUSPID VALVE MV Area (PHT): 3.77 cm    TR Peak grad:   52.7 mmHg MV Decel Time: 201 msec    TR Vmax:        363.00 cm/s MV E velocity: 58.00 cm/s MV A velocity: 91.30 cm/s  SHUNTS MV E/A ratio:  0.64        Systemic VTI:  0.19 m                            Systemic Diam: 2.60 cm Lyman Bishop MD Electronically signed by Lyman Bishop MD Signature Date/Time: 08/07/2019/4:39:54 PM    Final (Updated)    CT Renal Stone Study  Result Date: 08/04/2019 CLINICAL DATA:  Gross hematuria, history of kidney stones EXAM: CT ABDOMEN AND PELVIS WITHOUT CONTRAST TECHNIQUE: Multidetector CT imaging of the abdomen and pelvis was performed following the standard protocol without IV contrast. COMPARISON:  02/05/2019 FINDINGS: Lower chest: No acute abnormality. Hepatobiliary: Hepatic steatosis. Cholelithiasis. No biliary dilatation. Pancreas: Unremarkable. Spleen: Unremarkable. Adrenals/Urinary Tract: Adrenals are unremarkable. Renal cortical thinning. Stable  small right upper pole renal cyst. Bilateral nonobstructing renal calculi are again identified measuring up to 3 mm. There is a 5 mm obstructing calculus within the proximal left ureter with resulting hydroureteronephrosis. Poorly distended bladder is unremarkable. Stomach/Bowel: Stomach is within normal limits. Bowel is normal in caliber. Small bowel loops are closely apposed to the ventral abdominal wall. Mild diverticulosis. Vascular/Lymphatic: Aortic atherosclerosis. No enlarged abdominal or pelvic lymph nodes. No adenopathy. Reproductive: Marked prostate enlargement. Other: No ascites. Small fat containing inguinal hernias. Tiny fat containing left parasagittal ventral hernia no longer contains a bowel loop. Musculoskeletal: No acute osseous finding. IMPRESSION: 5 mm obstructing calculus of the proximal left ureter with hydroureteronephrosis. Additional nonobstructing renal calculi. Additional stable findings as detailed above. Electronically Signed   By: Macy Mis M.D.   On: 08/04/2019 19:21   IR NEPHROSTOMY PLACEMENT LEFT  Result Date: 08/06/2019 INDICATION: 74 year old male with an obstructing left ureteral calculus and resultant hydronephrosis. He presents for attempted nephrostomy tube placement.  EXAM: IR NEPHROSTOMY PLACEMENT LEFT COMPARISON:  None. MEDICATIONS: 2 g Ancef; The antibiotic was administered in an appropriate time frame prior to skin puncture. ANESTHESIA/SEDATION: Fentanyl 100 mcg IV; Versed 2 mg IV Moderate Sedation Time:  10 minutes The patient was continuously monitored during the procedure by the interventional radiology nurse under my direct supervision. CONTRAST:  8 mL Omnipaque 300-administered into the collecting system(s) FLUOROSCOPY TIME:  Fluoroscopy Time: 0 minutes 42 seconds (46 mGy). COMPLICATIONS: None immediate. TECHNIQUE: The procedure, risks, benefits, and alternatives were explained to the patient. Questions regarding the procedure were encouraged and answered. The  patient understands and consents to the procedure. The left flank was prepped with chlorhexidine in a sterile fashion, and a sterile drape was applied covering the operative field. A sterile gown and sterile gloves were used for the procedure. Local anesthesia was provided with 1% Lidocaine. The left flank was interrogated with ultrasound and the left kidney identified. The kidney is hydronephrotic. A suitable access site on the skin overlying the lower pole, posterior calix was identified. After local mg anesthesia was achieved, a small skin nick was made with an 11 blade scalpel. A 21 gauge Accustick needle was then advanced under direct sonographic guidance into the lower pole of the left kidney. A 0.018 inch wire was advanced under fluoroscopic guidance into the left renal collecting system. The Accustick sheath was then advanced over the wire and a 0.018 system exchanged for a 0.035 system. Gentle hand injection of contrast material confirms placement of the sheath within the renal collecting system. There is moderate hydronephrosis. The tract from the scan into the renal collecting system was then dilated serially to 10-French. A 10-French Cook all-purpose drain was then placed and positioned under fluoroscopic guidance. The locking loop is well formed within the left renal pelvis. The catheter was secured to the skin with 2-0 Prolene and a sterile bandage was placed. Catheter was left to gravity bag drainage. IMPRESSION: Successful placement of a left 10 French percutaneous nephrostomy tube. Electronically Signed   By: Jacqulynn Cadet M.D.   On: 08/06/2019 13:56    Microbiology: Recent Results (from the past 240 hour(s))  Culture, blood (routine x 2)     Status: None   Collection Time: 08/04/19  5:10 PM   Specimen: BLOOD LEFT FOREARM  Result Value Ref Range Status   Specimen Description   Final    BLOOD LEFT FOREARM Performed at [facility], [place] [address], [place],  [place] [address]    Special Requests   Final    BOTTLES DRAWN AEROBIC AND ANAEROBIC Blood Culture results may not be optimal due to an excessive volume of blood received in culture bottles Performed at [place] [address], [place], [place] [address]    Culture   Final    NO GROWTH 5 DAYS Performed at [facility], [place] [address], [place], [place] [address]    Report Status 08/09/2019 FINAL  Final  Culture, blood (routine x 2)     Status: None   Collection Time: 08/04/19  5:15 PM   Specimen: BLOOD  Result Value Ref Range Status   Specimen Description   Final    BLOOD BLOOD RIGHT FOREARM Performed at [place] [address] [place],  [address]    Special Requests   Final    BOTTLES DRAWN AEROBIC AND ANAEROBIC Blood Culture adequate volume Performed at [place] [address] [place], [place]  [address]    Culture   Final    NO GROWTH 5 DAYS Performed at [facility], [place] [address], [place], [place] [address]    Report Status 08/09/2019 FINAL  Final  Respiratory Panel by RT PCR (Flu A&B, Covid) - Nasopharyngeal Swab     Status: None   Collection Time: 08/04/19  7:21 PM   Specimen: Nasopharyngeal Swab  Result Value Ref Range Status   SARS Coronavirus 2 by RT PCR NEGATIVE NEGATIVE Final    Comment: (NOTE) SARS-CoV-2 target nucleic acids are NOT DETECTED. The SARS-CoV-2 RNA is generally detectable in upper respiratoy specimens during the acute phase of infection. The lowest concentration of SARS-CoV-2 viral copies this assay can detect is 131 copies/mL. A negative result does not preclude SARS-Cov-2 infection and should not be used as the sole basis for treatment or other patient management decisions. A negative result may occur with  improper specimen collection/handling, submission of specimen other than nasopharyngeal swab, presence of viral mutation(s) within the areas targeted by this  assay, and inadequate number of viral copies (<131 copies/mL). A negative result must be combined with clinical observations, patient history, and epidemiological information. The expected result is Negative. Fact Sheet for Patients:  PinkCheek.be Fact Sheet for Healthcare Providers:  GravelBags.it This test is not yet ap proved or cleared by the [place] FDA and  has been authorized for detection and/or diagnosis of SARS-CoV-2 by FDA under an Emergency Use Authorization (EUA). This EUA will remain  in effect (meaning this test can be used) for the duration of the COVID-19 declaration under Section 564(b)(1) of the Act, 21 U.S.C. section 360bbb-3(b)(1), unless the authorization is terminated or revoked sooner.    Influenza A by PCR NEGATIVE NEGATIVE Final   Influenza B by PCR NEGATIVE NEGATIVE Final    Comment: (NOTE) The Xpert Xpress SARS-CoV-2/FLU/RSV assay is intended as an aid in  the diagnosis of influenza from Nasopharyngeal swab specimens and  should not be used as a sole basis for treatment. Nasal washings and  aspirates are unacceptable for Xpert Xpress SARS-CoV-2/FLU/RSV  testing. Fact Sheet for Patients: PinkCheek.be Fact Sheet for Healthcare Providers: GravelBags.it This test is not yet approved or cleared by the [place] FDA and  has been authorized for detection and/or diagnosis of SARS-CoV-2 by  FDA under an Emergency Use Authorization (EUA). This EUA will remain  in effect (meaning this test can be used) for the duration of the  Covid-19 declaration under Section 564(b)(1) of the Act, 21  U.S.C. section 360bbb-3(b)(1), unless the authorization is  terminated or revoked. Performed at [facility], [place] [address] [place], [place] [address]   Culture, blood (routine x 2)     Status: None   Collection Time: 08/06/19  2:29 PM    Specimen: BLOOD  Result Value Ref Range Status   Specimen Description   Final    BLOOD RIGHT ANTECUBITAL Performed at [place] [address], [place], [place] [address]    Special Requests   Final    BOTTLES DRAWN AEROBIC AND ANAEROBIC Blood Culture adequate volume Performed at [place] [address] [place], [place] [address]    Culture   Final    NO GROWTH 5 DAYS Performed at [facility], [place] [address], [place], [place] [address]    Report Status 08/11/2019 FINAL  Final  Culture, blood (routine x 2)     Status: None   Collection Time:  08/06/19  2:31 PM   Specimen: BLOOD  Result Value Ref Range Status   Specimen Description   Final    BLOOD RIGHT ANTECUBITAL Performed at [place] [address] [place], [place] [address]    Special Requests   Final    BOTTLES DRAWN AEROBIC AND ANAEROBIC Blood Culture adequate volume Performed at [place] [address] [place], [place] [address]    Culture   Final    NO GROWTH 5 DAYS Performed at [facility], [place] [address], [place], [place] [address]    Report Status 08/11/2019 FINAL  Final  Culture, Urine     Status: None   Collection Time: 08/06/19  3:29 PM   Specimen: Urine, Random  Result Value Ref Range Status   Specimen Description   Final    URINE, RANDOM Performed at [place] [address], [place], [place] [address]    Special Requests   Final    NONE Performed at [facility], [place] [address], [place], [place] [address]    Culture   Final    NO GROWTH Performed at [facility], [place] [address], [place], [place] [address]    Report Status 08/07/2019 FINAL  Final  MRSA PCR Screening     Status: None   Collection Time: 08/06/19  6:38 PM   Specimen: Nasal Mucosa; Nasopharyngeal  Result Value Ref Range Status   MRSA by PCR NEGATIVE NEGATIVE Final    Comment:         The GeneXpert MRSA Assay (FDA approved for NASAL specimens only), is one component of a comprehensive MRSA colonization surveillance program. It is not intended to diagnose MRSA infection nor to guide or monitor treatment for MRSA infections. Performed at [facility], [place] [address], [place],  [address]      Labs: Basic Metabolic Panel: Recent Labs  Lab 08/09/19 0521 08/09/19 0521 08/09/19 1803 08/10/19 0328 08/11/19 0215 08/12/19 0359 08/13/19 0354  NA 138  --   --  140 138 139 136  K 3.4*  --   --  3.8 3.5 4.4 3.5  CL 102  --   --  103 103 104 104  CO2 23  --   --  26 25 24 23   GLUCOSE 114*  --   --  107* 95 105* 115*  BUN 18  --   --  15 15 14 12   CREATININE 1.16   < > 1.18 1.18 1.18 1.13 0.97  CALCIUM 8.4*  --   --  8.7* 8.3* 8.8* 8.2*  PHOS  --   --   --   --  2.6  --  2.1*   < > = values in this interval not displayed.   Liver Function Tests: Recent Labs  Lab 08/07/19 0248 08/11/19 0215 08/13/19 0354  AST 20  --   --   ALT 15  --   --   ALKPHOS 77  --   --   BILITOT 0.9  --   --   PROT 5.4*  --   --   ALBUMIN 2.4* 2.4* 2.4*   No results for input(s): LIPASE, AMYLASE in the last 168 hours. No results for input(s): AMMONIA in the last 168 hours. CBC: Recent Labs  Lab 08/06/19 1412 08/07/19 0248 08/09/19 1803 08/10/19 0328 08/11/19 0215 08/12/19 0359 08/13/19 0354  WBC 2.3*   < > 10.1 12.8* 8.8 10.1 8.8  NEUTROABS 1.8  --   --   --  5.6 7.1  --   HGB 14.3   < > 14.7 14.1 13.0 13.5 12.5*  HCT 44.9   < > 44.8 42.2 39.4 41.6 37.5*  MCV 88.9   < > 84.7 85.3 86.2 86.1 85.6  PLT 221   < > 297 298 276 297 289   < > = values in this interval not displayed.   Cardiac Enzymes: No results for input(s): CKTOTAL, CKMB, CKMBINDEX, TROPONINI in the last 168 hours. BNP: BNP (last 3 results) No results for input(s): BNP in the last 8760 hours.  ProBNP (last 3 results) No results for input(s): PROBNP in the last 8760  hours.  CBG: No results for input(s): GLUCAP in the last 168 hours.     Signed:  Nita Sells MD   [facility] 08/13/2019, 11:13 AM

## 2019-08-13 NOTE — Progress Notes (Signed)
Referring Physician(s): Dr. [name]  Supervising Physician: Markus Daft  Patient Status:  [facility] - In-pt  Chief Complaint: Ureteral calculi with nephrolithiasis and hydronephrosis  Subjective: 74 y.o, male inpatient. History of known left renal calculi  With nephrolithiasis, found to have AKI  s/p left nephrostomy tube placement on 4.6.21 with post procedure sepsis. Per discharge summary patient to follow up with urology 6 weeks post nephrostomy tube placement. Patient alert, sitting in the recliner  calm and comfortable. Denies any fevers, headache, chest pain, SOB, cough, abdominal pain, nausea, vomiting or bleeding. Patient reports minimal pain at exit site with movement however states that there is improvement since the suture was changed on 4.11.21   Allergies: Codeine  Medications: Prior to Admission medications   Medication Sig Start Date End Date Taking? Authorizing Provider  aspirin EC 81 MG EC tablet Take 1 tablet (81 mg total) by mouth daily. 08/14/19   Nita Sells, MD  lisinopril (ZESTRIL) 5 MG tablet Take 1 tablet (5 mg total) by mouth daily. 08/14/19   Nita Sells, MD  metoprolol succinate (TOPROL-XL) 50 MG 24 hr tablet Take 1 tablet (50 mg total) by mouth daily. Take with or immediately following a meal. 08/13/19   Nita Sells, MD  rosuvastatin (CRESTOR) 40 MG tablet Take 1 tablet (40 mg total) by mouth daily at 6 PM. 08/13/19   Nita Sells, MD  ticagrelor (BRILINTA) 90 MG TABS tablet Take 1 tablet (90 mg total) by mouth 2 (two) times daily. 08/13/19   Nita Sells, MD     Vital Signs: BP (!) 155/80   Pulse 72   Temp 98.2 F (36.8 C) (Oral)   Resp (!) 21   Ht 5\' 8"  (1.727 m)   Wt 266 lb 12.1 oz (121 kg)   SpO2 98%   BMI 40.56 kg/m   Physical Exam Vitals and nursing note reviewed.  Constitutional:      Appearance: He is well-developed.  HENT:     Head: Normocephalic.  Pulmonary:     Effort: Pulmonary effort is  normal.  Genitourinary:    Comments: Left nephrostomy drain  togravity bag. site is unremarkable with no erythema, edema, tenderness, bleeding or drainage noted at exit site. Suture and stat lock in place. Dressing dry and intact with dried blood noted. 15 ml of  amber colored fluid noted in gravity  device.    Musculoskeletal:        General: Normal range of motion.     Cervical back: Normal range of motion.  Skin:    General: Skin is dry.  Neurological:     Mental Status: He is alert and oriented to person, place, and time.     Imaging: CARDIAC CATHETERIZATION  Result Date: 08/12/2019 Conclusions: 1. Severe three-vessel coronary artery disease, as documented on last weeks diagnostic catheterization. 2. Successful IVUS-guided PCI of the proximal LCx using Synergy 3.5 x 16 mm drug-eluting stent with 0% residual stenosis and TIMI-3 flow. 3. Successful IVUS-guided PCI to proximal RCA using Synergy 4.0 x 20 mm drug eluting stent with 0% residual stenosis and TIMI-3 flow.  Recommendations: 1. Dual antiplatelet therapy with aspirin and ticagrelor for at least 12 months, ideally longer.  If possible, DAPT should not be interrupted for at least 1 month unless there is life threatening bleeding or emergent surgery is needed. 2. Aggressive secondary prevention of coronary artery disease and medical management of chronically occluded LAD. Nelva Bush, MD [facility]   CARDIAC CATHETERIZATION  Addendum Date: 08/09/2019  Ramus lesion is 75% stenosed.  Mid LAD-1 lesion is 80% stenosed.  Mid LAD-2 lesion is 99% stenosed.  Prox RCA lesion is 90% stenosed.  Mid RCA lesion is 25% stenosed.  Prox Cx lesion is 80% stenosed.  LV end diastolic pressure is moderately elevated.  There is no aortic valve stenosis.  Severe three vessel disease.  Will get cardiac surgery opinion.  Diffuse distal vessel disease as well.  His RCA and circumflex are treatable percutaneously.  LAD appears to be a chronic  occlusion and may not have a good target for LIMA graft.  He has significant other medical issues as well.  Discussed with Dr. [name].  If he is not a candidate for CABG and if PCI done, urology could wait 30 days post procedure to perform urologic surgery.   Result Date: 08/09/2019  Ramus lesion is 75% stenosed.  Mid LAD-1 lesion is 80% stenosed.  Mid LAD-2 lesion is 99% stenosed.  Prox RCA lesion is 90% stenosed.  Mid RCA lesion is 25% stenosed.  Prox Cx lesion is 80% stenosed.  LV end diastolic pressure is moderately elevated.  There is no aortic valve stenosis.  Severe three vessel disease.  Will get cardiac surgery opinion.  Diffuse distal vessel disease as well.  His RCA and circumflex are treatable percutaneously.  LAD appears to be a chronic occlusion and may not have a good target for LIMA graft.  He has significant other medical issues as well.  Discussed with Dr. [name].  If PCI done, they could wait 30 days post procedure to perform urologic surgery.    Labs:  CBC: Recent Labs    08/10/19 0328 08/11/19 0215 08/12/19 0359 08/13/19 0354  WBC 12.8* 8.8 10.1 8.8  HGB 14.1 13.0 13.5 12.5*  HCT 42.2 39.4 41.6 37.5*  PLT 298 276 297 289    COAGS: Recent Labs    02/04/19 1354 08/05/19 0856  INR 1.0 1.1    BMP: Recent Labs    08/10/19 0328 08/11/19 0215 08/12/19 0359 08/13/19 0354  NA 140 138 139 136  K 3.8 3.5 4.4 3.5  CL 103 103 104 104  CO2 26 25 24 23   GLUCOSE 107* 95 105* 115*  BUN 15 15 14 12   CALCIUM 8.7* 8.3* 8.8* 8.2*  CREATININE 1.18 1.18 1.13 0.97  GFRNONAA >60 >60 >60 >60  GFRAA >60 >60 >60 >60    LIVER FUNCTION TESTS: Recent Labs    08/04/19 1702 08/04/19 1702 08/05/19 0515 08/07/19 0248 08/11/19 0215 08/13/19 0354  BILITOT 1.1  --  1.0 0.9  --   --   AST 16  --  13* 20  --   --   ALT 16  --  14 15  --   --   ALKPHOS 106  --  87 77  --   --   PROT 6.8  --  5.8* 5.4*  --   --   ALBUMIN 3.3*   < > 2.7* 2.4* 2.4* 2.4*   < > = values in  this interval not displayed.    Assessment and Plan:  74 y.o, male inpatient. History of known left renal calculi  With nephrolithiasis, found to have AKI  s/p left nephrostomy tube placement on 4.6.21 with post procedure sepsis. Per discharge summary patient to follow up with urology 6 weeks post nephrostomy tube placement. Per epic output is:   275 ml, 925 ml, 475 ml  Pertinent Imaging None since placement  Pertinent IR History 4.6.21 -  placement left nephrostomy tube  Pertinent Allergies Codeine  All labs are within acceptable parameters. Patient is on subcutaneous prophylactic dose of lovenox.  Patient is afebrile.  Patient to stable from IR perscpective s/p nephrostomy tube placement  further plans per urology Team please call IR with questions or concerns.    Electronically Signed: Avel Peace, NP 08/13/2019, 11:48 AM   I spent a total of 15 Minutes at the the patient's bedside AND on the patient's hospital floor or unit, greater than 50% of which was counseling/coordinating care for left nephrostomy tube evaluation

## 2019-08-13 NOTE — Care Management (Signed)
Provided with Brilinta 30 day free card, benefit check pending

## 2019-08-13 NOTE — Progress Notes (Addendum)
Telemetry notified this nurse patients hr fell to 38bpm.  Vital signs stable. Patient states he is not having any chest pain or discomfort at this time.  EKG being performed.

## 2019-08-13 NOTE — TOC Benefit Eligibility Note (Signed)
Transition of Care [facility]) Benefit Eligibility Note    Patient Details  Name: [name] MRN: [id number] Date of Birth: April 20, 1946   Medication/Dose: Kary Kos 40m BID     Tier: 3 Drug  Prescription Coverage Preferred Pharmacy: any retail pharmacy  Spoke with Person/Company/Phone Number:: [employer]  Co-Pay: $37.00 for 30 day retail  Prior Approval: No  Deductible: Met       MBarb MerinoShular Phone Number: 08/13/2019, 3:05 PM

## 2019-08-13 NOTE — TOC Transition Note (Addendum)
Transition of Care [facility]) - CM/SW Discharge Note   Patient Details  Name: [name] MRN: [id number] Date of Birth: October 04, 1945  Transition of Care [facility]) CM/SW Contact:  Verdell Carmine, RN Phone Number:[phone] 08/13/2019, 12:22 PM   Clinical Narrative:    Patient refused home health services stating that he can "get to where he needs to go". He states he knows what to do with his nephrostomy tube. Educated patient regarding no bathing, if he has fever, pus coming from site, or redness, unusual pain., odorous fluid in bag, or bloody or different colored fluid in bag than normal, that he should notify his doctor immediately.  He states his MD office will call him for appointment within a week to follow up.  He cites medical  issues with daughter and wife as immediate concerns of his.         Patient Goals and CMS Choice        Discharge Placement             Home, no services          Discharge Plan and Services    Refused home health services at this time.                                  Social Determinants of Health (SDOH) Interventions    Readmission Risk Interventions No flowsheet data found.  Risk for readmission due to refusal of services

## 2019-08-13 NOTE — Telephone Encounter (Signed)
-----   Message from [place], PA-C sent at 08/13/2019 10:44 AM EDT ----- Regarding: TOC phone call Patient is being discharged today. He was admitted with sepsis found to have 3VCAD and is s/p stent. He has a TOC follow up 4/20. Please arrange [facility] phone call. Thanks!

## 2019-08-14 ENCOUNTER — Telehealth (HOSPITAL_COMMUNITY): Payer: Self-pay

## 2019-08-14 NOTE — Telephone Encounter (Signed)
Attempted to call patient in regards to Cardiac Rehab - LM on VM 

## 2019-08-14 NOTE — Telephone Encounter (Signed)
Patient contacted regarding discharge from  [facility] on April 13,2021.  Patient understands to follow up with provider -yes.  Kathyrn Drown, NP on August 20,2021 at 2:15 Patient understands discharge instructions? yes Patient understands medications and regiment? yes Patient understands to bring all medications to this visit? yes  Ask patient:  Are you enrolled in My Chart -yes.  He is having trouble getting logged in.  Number to contact for my chart assistance provided to patient.

## 2019-08-14 NOTE — Telephone Encounter (Signed)
Pt insurance is active and benefits verified through [employer]. Co-pay $30.00, DED $0.00/$0.00 met, out of pocket $4,200.00/$115.70 met, co-insurance 0%. No pre-authorization required. John C./[employer], 08/14/19 @4 :13PM, REF#[id number]  Will contact patient to see if he is interested in the Cardiac Rehab Program. If interested, patient will need to complete follow up appt. Once completed, patient will be contacted for scheduling upon review by the RN Navigator.

## 2019-08-16 NOTE — Progress Notes (Signed)
CARDIOLOGY OFFICE NOTE  Date:  08/19/2019    [name] Date of Birth: [birth year] Medical Record [id number]  PCP:  [name], MD  Cardiologist:  Radford Pax (NEW)   Chief Complaint  Patient presents with  . Hospitalization Follow-up    Seen for Dr. Radford Pax (NEW)    History of Present Illness: [name] is a 74 y.o. male who presents today for a post hospital/TOC visit. Seen for Dr. Radford Pax (NEW).   He has a known history of left renal calculi, BPH - followed by Dr. [name] - migraines, CKD, ad obesity. No known previous history of CAD noted.   Presented earlier this month to [facility] with general malaise, fever with N/V. Had fever of 102. CT with 53mm obstructing calculus. He had percutaneous nephrostomy and developed post procedure sepsis with hypoxic respiratory failure. Troponin elevated. Cardiology consulted. Echo with EF 45 to 50%. Noted to have3VD - CT surgery recommended PCI along with medical management. He had PCI with DES to pRCA and [place].  He was discharged on DAPT with aspirin and Brilinta. To have at least one month of DAPT therapy. To have definitive stone management per Dr. [name] in approximately 6 weeks from his PCI.   The patient does not have symptoms concerning for COVID-19 infection (fever, chills, cough, or new shortness of breath).   Comes in today. Here alone. He feels like he is doing ok - still with his nephrostomy tube in place. No chest pain - never had. Breathing is ok. Not dizzy or lightheaded. Tolerating his medicines - does note a little fatigue. No swelling.   Past Medical History:  Diagnosis Date  . Chronic kidney disease    RENAL STONES  . High frequency hearing loss   . History of migraine headaches   . Hx of colonic polyps   . Obesity   . Seborrheic dermatitis   . Vitiligo     Past Surgical History:  Procedure Laterality Date  . COLON SURGERY  01/2001  . CORONARY STENT INTERVENTION N/A 08/12/2019   Procedure:  CORONARY STENT INTERVENTION;  Surgeon: Nelva Bush, MD;  Location: [place] [facility];  Service: Cardiovascular;  Laterality: N/A;  . CYSTOSCOPY/URETEROSCOPY/HOLMIUM LASER/STENT PLACEMENT Left 02/04/2019   Procedure: CYSTOSCOPY Left retrograde and attempted left ureteroscopy;  Surgeon: Raynelle Bring, MD;  Location: [facility];  Service: Urology;  Laterality: Left;  ONLY NEEDS 60 MIN  . FRACTURE SURGERY    . INTRAVASCULAR ULTRASOUND/IVUS N/A 08/12/2019   Procedure: Intravascular Ultrasound/IVUS;  Surgeon: Nelva Bush, MD;  Location: [place] [facility];  Service: Cardiovascular;  Laterality: N/A;  . IR NEPHROSTOMY PLACEMENT LEFT  02/05/2019  . IR NEPHROSTOMY PLACEMENT LEFT  08/06/2019  . LEFT HEART CATH AND CORONARY ANGIOGRAPHY N/A 08/09/2019   Procedure: LEFT HEART CATH AND CORONARY ANGIOGRAPHY;  Surgeon: Jettie Booze, MD;  Location: [place] [facility];  Service: Cardiovascular;  Laterality: N/A;  . TONSILLECTOMY AND ADENOIDECTOMY       Medications: Current Meds  Medication Sig  . aspirin EC 81 MG EC tablet Take 1 tablet (81 mg total) by mouth daily.  Marland Kitchen lisinopril (ZESTRIL) 5 MG tablet Take 1 tablet (5 mg total) by mouth daily.  . metoprolol succinate (TOPROL-XL) 50 MG 24 hr tablet Take 1 tablet (50 mg total) by mouth daily. Take with or immediately following a meal.  . rosuvastatin (CRESTOR) 40 MG tablet Take 1 tablet (40 mg total) by mouth daily at 6 PM.  . ticagrelor (BRILINTA)  90 MG TABS tablet Take 1 tablet (90 mg total) by mouth 2 (two) times daily.  . [DISCONTINUED] lisinopril (ZESTRIL) 5 MG tablet Take 1 tablet (5 mg total) by mouth daily.  . [DISCONTINUED] metoprolol succinate (TOPROL-XL) 50 MG 24 hr tablet Take 1 tablet (50 mg total) by mouth daily. Take with or immediately following a meal.  . [DISCONTINUED] rosuvastatin (CRESTOR) 40 MG tablet Take 1 tablet (40 mg total) by mouth daily at 6 PM.  . [DISCONTINUED] ticagrelor (BRILINTA) 90 MG TABS tablet Take 1 tablet (90 mg  total) by mouth 2 (two) times daily.     Allergies: Allergies  Allergen Reactions  . Codeine     Bad Headache    Social History: The patient  reports that he quit smoking about 24 years ago. His smoking use included cigarettes. He has a 40.00 pack-year smoking history. He has never used smokeless tobacco. He reports previous alcohol use. He reports that he does not use drugs.   Family History: The patient's family history is not on file.   Review of Systems: Please see the history of present illness.   All other systems are reviewed and negative.   Physical Exam: VS:  BP (!) 142/80   Pulse (!) 59   Ht 5\' 8"  (1.727 m)   Wt 249 lb 14.4 oz (113.4 kg)   SpO2 97%   BMI 38.00 kg/m  .  BMI Body mass index is 38 kg/m.  Wt Readings from Last 3 Encounters:  08/19/19 249 lb 14.4 oz (113.4 kg)  08/13/19 266 lb 12.1 oz (121 kg)  02/04/19 284 lb 2.8 oz (128.9 kg)   BP recheck by me is 130/70.   General: Pleasant. He is obese. Alert and in no acute distress.   HEENT: Normal.  Neck: Supple, no JVD, carotid bruits, or masses noted.  Cardiac: Regular rate and rhythm. No murmurs, rubs, or gallops. No edema.  Right wrist dressing was removed.  Respiratory:  Lungs are clear to auscultation bilaterally with normal work of breathing.  GI/GU: Soft and nontender. Nephrostomy tube is in place.  MS: No deformity or atrophy. Gait and ROM intact.  Skin: Warm and dry. Color is normal.  Neuro:  Strength and sensation are intact and no gross focal deficits noted.  Psych: Alert, appropriate and with normal affect.   LABORATORY DATA:  EKG:  EKG is ordered today.  Personally reviewed by me. This demonstrates sinus bradycardia - HR is 59.  Lab Results  Component Value Date   WBC 8.8 08/13/2019   HGB 12.5 (L) 08/13/2019   HCT 37.5 (L) 08/13/2019   PLT 289 08/13/2019   GLUCOSE 115 (H) 08/13/2019   CHOL 135 08/09/2019   TRIG 234 (H) 08/09/2019   HDL 15 (L) 08/09/2019   LDLCALC 73 08/09/2019     ALT 15 08/07/2019   AST 20 08/07/2019   NA 136 08/13/2019   K 3.5 08/13/2019   CL 104 08/13/2019   CREATININE 0.97 08/13/2019   BUN 12 08/13/2019   CO2 23 08/13/2019   INR 1.1 08/05/2019   HGBA1C 5.8 (H) 08/08/2019     BNP (last 3 results) No results for input(s): BNP in the last 8760 hours.  ProBNP (last 3 results) No results for input(s): PROBNP in the last 8760 hours.   Other Studies Reviewed Today:  TTE 08/07/2019 1. Left ventricular ejection fraction, by estimation, is 45 to 50%. The  left ventricle has low normal function. The left ventricle demonstrates  global  hypokinesis. There is mild left ventricular hypertrophy. Left  ventricular diastolic parameters are  consistent with Grade I diastolic dysfunction (impaired relaxation). There  is incoordinate septal motion.  2. Right ventricular systolic function is low normal. The right  ventricular size is normal. There is moderately elevated pulmonary artery  systolic pressure. The estimated right ventricular systolic pressure is  99991111 mmHg.  3. The mitral valve is abnormal. Trivial mitral valve regurgitation.  4. The aortic valve is tricuspid. Aortic valve regurgitation is not  visualized.  5. The inferior vena cava is normal in size with greater than 50%  respiratory variability, suggesting right atrial pressure of 3 mmHg.   CORONARY STENT INTERVENTION 08/11/2019  Intravascular Ultrasound/IVUS  Conclusion  Conclusions: 1. Severe three-vessel coronary artery disease, as documented on last weeks diagnostic catheterization. 2. Successful IVUS-guided PCI of the proximal LCx using Synergy 3.5 x 16 mm drug-eluting stent with 0% residual stenosis and TIMI-3 flow. 3. Successful IVUS-guided PCI to proximal RCA using Synergy 4.0 x 20 mm drug eluting stent with 0% residual stenosis and TIMI-3 flow.  Recommendations: 1. Dual antiplatelet therapy with aspirin and ticagrelor for at least 12 months, ideally longer. If  possible, DAPT should not be interrupted for at least 1 month unless there is life threatening bleeding or emergent surgery is needed. 2. Aggressive secondary prevention of coronary artery disease and medical management of chronically occluded LAD.  Nelva Bush, MD    [facility] 08/09/2019  Ramus lesion is 75% stenosed.  Mid LAD-1 lesion is 80% stenosed.  Mid LAD-2 lesion is 99% stenosed.  Prox RCA lesion is 90% stenosed.  Mid RCA lesion is 25% stenosed.  Prox Cx lesion is 80% stenosed.  LV end diastolic pressure is moderately elevated.  There is no aortic valve stenosis.  Severe three vessel disease. Will get cardiac surgery opinion. Diffuse distal vessel disease as well.   His RCA and circumflex are treatable percutaneously. LAD appears to be a chronic occlusion and may not have a good target for LIMA graft. He has significant other medical issues as well. Discussed with Dr. [name]. If he is not a candidate for CABG and if PCI done, urology could wait 30 days post procedure to perform urologic surgery.   Assessment & Plan   1. NSTEMI/demand ischemia in setting of sepsis - found to have 3 vessel CAD - s/p PCI to pRCA and pLCX - residual disease noted with 99% CTO mLAD. He is on DAPT along with beta blocker and statin therapy - plan was to delay his urological procedure for 6 week and then to proceed on aspirin during interruption of DAPT. Will continue with his current regimen. [place] lab today. Felt to be doing well.   2. 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 Repeat BP ok today - for now, no change in his current regimen. No signs of CHF noted.   3. HLD - on statin - will need lab on return.  4. Renal calculi - has nephrostomy tube in place - plan per GU - noted that he is to have uninterrupted DAPT for 6 weeks and then interrupt for his procedure but to continue aspirin.   5. COVID-19 Education: The  signs and symptoms of COVID-19 were discussed with the patient and how to seek care for testing (follow up with PCP or arrange E-visit).  The importance of social distancing, staying at home, hand hygiene and wearing a  mask when out in public were discussed today. He has had both vaccines.   Current medicines are reviewed with the patient today.  The patient does not have concerns regarding medicines other than what has been noted above.  The following changes have been made:  See above.  Labs/ tests ordered today include:    Orders Placed This Encounter  Procedures  . Basic metabolic panel  . CBC  . EKG 12-Lead     Disposition:   FU with Dr. Radford Pax as planned next month.    Patient is agreeable to this plan and will call if any problems develop in the interim.   SignedTruitt Merle, NP  08/19/2019 10:02 AM  [place] [address] [place], [place]  [address] Phone: [phone] Fax: [phone]

## 2019-08-19 ENCOUNTER — Ambulatory Visit (HOSPITAL_COMMUNITY): Payer: Medicare Other

## 2019-08-19 ENCOUNTER — Other Ambulatory Visit: Payer: Self-pay

## 2019-08-19 ENCOUNTER — Encounter: Payer: Self-pay | Admitting: Nurse Practitioner

## 2019-08-19 ENCOUNTER — Encounter (HOSPITAL_COMMUNITY): Payer: Self-pay

## 2019-08-19 ENCOUNTER — Ambulatory Visit: Payer: Medicare Other | Admitting: Nurse Practitioner

## 2019-08-19 VITALS — BP 142/80 | HR 59 | Ht 68.0 in | Wt 249.9 lb

## 2019-08-19 DIAGNOSIS — I259 Chronic ischemic heart disease, unspecified: Secondary | ICD-10-CM | POA: Diagnosis not present

## 2019-08-19 DIAGNOSIS — Z955 Presence of coronary angioplasty implant and graft: Secondary | ICD-10-CM

## 2019-08-19 DIAGNOSIS — Z7189 Other specified counseling: Secondary | ICD-10-CM

## 2019-08-19 DIAGNOSIS — I255 Ischemic cardiomyopathy: Secondary | ICD-10-CM | POA: Diagnosis not present

## 2019-08-19 LAB — BASIC METABOLIC PANEL
BUN/Creatinine Ratio: 11 (ref 10–24)
BUN: 11 mg/dL (ref 8–27)
CO2: 21 mmol/L (ref 20–29)
Calcium: 9.3 mg/dL (ref 8.6–10.2)
Chloride: 102 mmol/L (ref 96–106)
Creatinine, Ser: 1.02 mg/dL (ref 0.76–1.27)
GFR calc Af Amer: 83 mL/min/{1.73_m2} (ref >59)
GFR calc non Af Amer: 72 mL/min/{1.73_m2} (ref >59)
Glucose: 90 mg/dL (ref 65–99)
Potassium: 4.8 mmol/L (ref 3.5–5.2)
Sodium: 138 mmol/L (ref 134–144)

## 2019-08-19 LAB — CBC
Hematocrit: 43.4 % (ref 37.5–51.0)
Hemoglobin: 14.6 g/dL (ref 13.0–17.7)
MCH: 28.9 pg (ref 26.6–33.0)
MCHC: 33.6 g/dL (ref 31.5–35.7)
MCV: 86 fL (ref 79–97)
Platelets: 427 10*3/uL (ref 150–450)
RBC: 5.06 x10E6/uL (ref 4.14–5.80)
RDW: 14.8 % (ref 11.6–15.4)
WBC: 9.2 10*3/uL (ref 3.4–10.8)

## 2019-08-19 MED ORDER — NITROGLYCERIN 0.4 MG SL SUBL: 0.4000 mg | SUBLINGUAL_TABLET | SUBLINGUAL | 3 refills | Status: AC | PRN

## 2019-08-19 MED ORDER — ROSUVASTATIN CALCIUM 40 MG PO TABS: 40.0000 mg | ORAL_TABLET | Freq: Every day | ORAL | 3 refills | Status: DC

## 2019-08-19 MED ORDER — LISINOPRIL 5 MG PO TABS: 5.0000 mg | ORAL_TABLET | Freq: Every day | ORAL | 3 refills | Status: DC

## 2019-08-19 MED ORDER — METOPROLOL SUCCINATE ER 50 MG PO TB24: 50.0000 mg | ORAL_TABLET | Freq: Every day | ORAL | 3 refills | Status: DC

## 2019-08-19 MED ORDER — TICAGRELOR 90 MG PO TABS: 90.0000 mg | ORAL_TABLET | Freq: Two times a day (BID) | ORAL | 3 refills | Status: DC

## 2019-08-19 NOTE — Patient Instructions (Addendum)
After Visit Summary:  We will be checking the following labs today - BMET & CBC   Medication Instructions:    Continue with your current medicines.   I sent in all your medicines today for #90.  I have sent in a RX for NTG - Use your NTG under your tongue for recurrent chest pain. May take one tablet every 5 minutes. If you are still having discomfort after 3 tablets in 15 minutes, call 911.    If you need a refill on your cardiac medications before your next appointment, please call your pharmacy.     Testing/Procedures To Be Arranged:  N/A  Follow-Up:   Keep your follow up with Dr. Radford Pax for next month as planned.     At [facility], you and your health needs are our priority.  As part of our continuing mission to provide you with exceptional heart care, we have created designated Provider Care Teams.  These Care Teams include your primary Cardiologist (physician) and Advanced Practice Providers (APPs -  Physician Assistants and Nurse Practitioners) who all work together to provide you with the care you need, when you need it.  Special Instructions:  . Stay safe, stay home, wash your hands for at least 20 seconds and wear a mask when out in public.  . It was good to talk with you today.    Call the [place] office at [phone] if you have any questions, problems or concerns.

## 2019-08-20 ENCOUNTER — Ambulatory Visit: Payer: Medicare Other | Admitting: Cardiology

## 2019-09-06 ENCOUNTER — Other Ambulatory Visit (HOSPITAL_COMMUNITY): Payer: Self-pay | Admitting: Urology

## 2019-09-06 DIAGNOSIS — N201 Calculus of ureter: Secondary | ICD-10-CM

## 2019-09-09 ENCOUNTER — Other Ambulatory Visit (HOSPITAL_COMMUNITY): Payer: Self-pay | Admitting: Urology

## 2019-09-09 DIAGNOSIS — N201 Calculus of ureter: Secondary | ICD-10-CM

## 2019-09-10 ENCOUNTER — Ambulatory Visit: Payer: Medicare Other | Admitting: Cardiology

## 2019-09-10 ENCOUNTER — Other Ambulatory Visit: Payer: Self-pay

## 2019-09-10 ENCOUNTER — Telehealth: Payer: Self-pay | Admitting: *Deleted

## 2019-09-10 ENCOUNTER — Telehealth: Payer: Self-pay

## 2019-09-10 ENCOUNTER — Encounter: Payer: Self-pay | Admitting: Cardiology

## 2019-09-10 VITALS — BP 112/60 | HR 72 | Ht 68.0 in | Wt 254.0 lb

## 2019-09-10 DIAGNOSIS — I10 Essential (primary) hypertension: Secondary | ICD-10-CM

## 2019-09-10 DIAGNOSIS — I272 Pulmonary hypertension, unspecified: Secondary | ICD-10-CM

## 2019-09-10 DIAGNOSIS — I2510 Atherosclerotic heart disease of native coronary artery without angina pectoris: Secondary | ICD-10-CM | POA: Diagnosis not present

## 2019-09-10 DIAGNOSIS — E7800 Pure hypercholesterolemia, unspecified: Secondary | ICD-10-CM | POA: Diagnosis not present

## 2019-09-10 DIAGNOSIS — I255 Ischemic cardiomyopathy: Secondary | ICD-10-CM

## 2019-09-10 DIAGNOSIS — I2583 Coronary atherosclerosis due to lipid rich plaque: Secondary | ICD-10-CM

## 2019-09-10 NOTE — Telephone Encounter (Signed)
Spoke with [facility] in regards to patient's upcoming procedure. Patient is to be on Brilinta until 05/24 until it can be held for a procedure. Patient has a urological procedure scheduled for 05/18 so need to clarify if it is okay for the patient to be on Brilinta for his planned urological procedure or if it needs to be rescheduled until after that date. They will call back to let us know.

## 2019-09-10 NOTE — Progress Notes (Signed)
Cardiology Office Note:    Date:  09/10/2019   ID:  [name], DOB 01/12/1946, MRN [id number]  PCP:  [name], MD  Cardiologist:  Fransico Him, MD    Referring MD: [name], MD   Chief Complaint  Patient presents with  . Coronary Artery Disease  . Hypertension  . Hyperlipidemia  . Congestive Heart Failure    History of Present Illness:    [name] is a 74 y.o. male with a hx of left renal calculi, BPH - followed by Dr. [name] - migraines, CKD, and obesity. No known previous history of CAD noted.   Presented last month to [facility] with general malaise, fever with N/V. Had fever of 102. CT with 75mm obstructing calculus. He had percutaneous nephrostomy and developed post procedure sepsis with hypoxic respiratory failure. Troponin elevated. Cardiology consulted. Echo with EF 45 to 50%. Noted to have 3VD - CT surgery recommended PCI along with medical management. He had PCI with DES to pRCA and [place].  He was discharged on DAPT with aspirin and Brilinta. To have at least one month of DAPT therapy. To have definitive stone management per Dr. [name] in approximately 6 weeks from his PCI.   he is here today for followup and is doing well.  He denies any chest pain or pressure, PND, orthopnea, LE edema, dizziness, palpitations or syncope. He has chronic DOE which has improved after PCI.  He still has his nephrostomy tube in and is supposed to have this removed on 5/18 but this is only 5 weeks out from PCI.  He is compliant with his meds and is tolerating meds with no SE.    Past Medical History:  Diagnosis Date  . Chronic kidney disease    RENAL STONES  . High frequency hearing loss   . History of migraine headaches   . Hx of colonic polyps   . Obesity   . Seborrheic dermatitis   . Vitiligo     Past Surgical History:  Procedure Laterality Date  . COLON SURGERY  01/2001  . CORONARY STENT INTERVENTION N/A 08/12/2019   Procedure: CORONARY STENT  INTERVENTION;  Surgeon: Nelva Bush, MD;  Location: [facility];  Service: Cardiovascular;  Laterality: N/A;  . CYSTOSCOPY/URETEROSCOPY/HOLMIUM LASER/STENT PLACEMENT Left 02/04/2019   Procedure: CYSTOSCOPY Left retrograde and attempted left ureteroscopy;  Surgeon: Raynelle Bring, MD;  Location: [facility];  Service: Urology;  Laterality: Left;  ONLY NEEDS 60 MIN  . FRACTURE SURGERY    . INTRAVASCULAR ULTRASOUND/IVUS N/A 08/12/2019   Procedure: Intravascular Ultrasound/IVUS;  Surgeon: Nelva Bush, MD;  Location: [facility];  Service: Cardiovascular;  Laterality: N/A;  . IR NEPHROSTOMY PLACEMENT LEFT  02/05/2019  . IR NEPHROSTOMY PLACEMENT LEFT  08/06/2019  . LEFT HEART CATH AND CORONARY ANGIOGRAPHY N/A 08/09/2019   Procedure: LEFT HEART CATH AND CORONARY ANGIOGRAPHY;  Surgeon: Jettie Booze, MD;  Location: [facility];  Service: Cardiovascular;  Laterality: N/A;  . TONSILLECTOMY AND ADENOIDECTOMY      Current Medications: Current Meds  Medication Sig  . aspirin EC 81 MG EC tablet Take 1 tablet (81 mg total) by mouth daily.  Marland Kitchen lisinopril (ZESTRIL) 5 MG tablet Take 1 tablet (5 mg total) by mouth daily.  . metoprolol succinate (TOPROL-XL) 50 MG 24 hr tablet Take 1 tablet (50 mg total) by mouth daily. Take with or immediately following a meal.  . nitroGLYCERIN (NITROSTAT) 0.4 MG SL tablet Place 1 tablet (0.4 mg total)  under the tongue every 5 (five) minutes as needed for chest pain.  . rosuvastatin (CRESTOR) 40 MG tablet Take 1 tablet (40 mg total) by mouth daily at 6 PM.  . ticagrelor (BRILINTA) 90 MG TABS tablet Take 1 tablet (90 mg total) by mouth 2 (two) times daily.     Allergies:   Codeine   Social History   Socioeconomic History  . Marital status: Married    Spouse name: Not on file  . Number of children: Not on file  . Years of education: Not on file  . Highest education level: Not on file  Occupational History  . Not on file  Tobacco Use  . Smoking  status: Former Smoker    Packs/day: 1.00    Years: 40.00    Pack years: 40.00    Types: Cigarettes    Quit date: 1997    Years since quitting: 24.3  . Smokeless tobacco: Never Used  Substance and Sexual Activity  . Alcohol use: Not Currently  . Drug use: Never  . Sexual activity: Not on file  Other Topics Concern  . Not on file  Social History Narrative  . Not on file   Social Determinants of Health   Financial Resource Strain:   . Difficulty of Paying Living Expenses:   Food Insecurity:   . Worried About Charity fundraiser in the Last Year:   . Arboriculturist in the Last Year:   Transportation Needs:   . Film/video editor (Medical):   Marland Kitchen Lack of Transportation (Non-Medical):   Physical Activity:   . Days of Exercise per Week:   . Minutes of Exercise per Session:   Stress:   . Feeling of Stress :   Social Connections:   . Frequency of Communication with Friends and Family:   . Frequency of Social Gatherings with Friends and Family:   . Attends Religious Services:   . Active Member of Clubs or Organizations:   . Attends Archivist Meetings:   Marland Kitchen Marital Status:      Family History: The patient's family history is not on file.  ROS:   Please see the history of present illness.    ROS  All other systems reviewed and negative.   EKGs/Labs/Other Studies Reviewed:    The following studies were reviewed today: none   EKG:  EKG is not ordered today.   Recent Labs: 08/07/2019: ALT 15 08/19/2019: BUN 11; Creatinine, Ser 1.02; Hemoglobin 14.6; Platelets 427; Potassium 4.8; Sodium 138   Recent Lipid Panel    Component Value Date/Time   CHOL 135 08/09/2019 0521   TRIG 234 (H) 08/09/2019 0521   HDL 15 (L) 08/09/2019 0521   CHOLHDL 9.0 08/09/2019 0521   VLDL 47 (H) 08/09/2019 0521   LDLCALC 73 08/09/2019 0521    Physical Exam:    VS:  BP 112/60   Pulse 72   Ht 5\' 8"  (1.727 m)   Wt 254 lb (115.2 kg)   SpO2 99%   BMI 38.62 kg/m     Wt  Readings from Last 3 Encounters:  09/10/19 254 lb (115.2 kg)  08/19/19 249 lb 14.4 oz (113.4 kg)  08/13/19 266 lb 12.1 oz (121 kg)     GEN:  Well nourished, well developed in no acute distress HEENT: Normal NECK: No JVD; No carotid bruits LYMPHATICS: No lymphadenopathy CARDIAC: RRR, no murmurs, rubs, gallops RESPIRATORY:  Clear to auscultation without rales, wheezing or rhonchi  ABDOMEN: Soft, non-tender,  non-distended MUSCULOSKELETAL:  No edema; No deformity  SKIN: Warm and dry NEUROLOGIC:  Alert and oriented x 3 PSYCHIATRIC:  Normal affect   ASSESSMENT:    1. Coronary artery disease due to lipid rich plaque   2. Ischemic cardiomyopathy   3. Pure hypercholesterolemia   4. Pulmonary HTN ([place])   5. Essential hypertension    PLAN:    In order of problems listed above  1. NSTEMI/demand ischemia in setting of sepsis  - found to have 3 vessel CAD by cath  - s/p PCI to pRCA and pLCX with residual disease noted with 99% CTO mLAD.  - plan was to delay his urological procedure for 6 week and then to proceed on aspirin during interruption of DAPT. His surgery is planned for 5/18 but he will not be 6 weeks from PCI until 5/24.  We will call Urology to let them know he needs another week until he can come off DAPT. -continue ASA 81mg  daily, Brilinta 90mg  BID, Crestor and Toprol XL 50mg  daily  2. Ischemic CM  -does not appear to be volume overloaded on exam -mild reduction in his EF 45-50% by echo 08/2019 -continue on ACE and BB  -his daughter was concerned that he was having LE edema but he tells me this is intermittent and he has none on exam today.   3. HLD  -LDL goal < 70 -LDL 71 on labs last month -continue Crestor 40mg  daily  4.  Pulmonary HTN -PASP 36mmHg on echo -continue diuretics -check PFTs with DLC), home sleep study, VQ scan to rule out chronic thromboembolic disease -repeat echo in 6 months to make sure this is stable  5.  HTN -BP controlled -continue Toprol XL  50mg  daily and Lisinopril 5mg  daily  Medication Adjustments/Labs and Tests Ordered: Current medicines are reviewed at length with the patient today.  Concerns regarding medicines are outlined above.  Orders Placed This Encounter  Procedures  . DG Chest 2 View  . NM Pulmonary Perf and Vent  . ECHOCARDIOGRAM COMPLETE  . Pulmonary function test  . Home sleep test   No orders of the defined types were placed in this encounter.   Signed, Fransico Him, MD  09/10/2019 2:30 PM    [place]

## 2019-09-10 NOTE — Telephone Encounter (Signed)
-----   Message from Antonieta Iba, RN sent at 09/10/2019  2:07 PM EDT ----- Regarding: Home sleep study Home sleep study has been ordered. Thanks!

## 2019-09-10 NOTE — Patient Instructions (Addendum)
Medication Instructions:  Your physician recommends that you continue on your current medications as directed. Please refer to the Current Medication list given to you today.  *If you need a refill on your cardiac medications before your next appointment, please call your pharmacy*  Tests/Procedures Your physician has recommended that you have a sleep study. This test records several body functions during sleep, including: brain activity, eye movement, oxygen and carbon dioxide blood levels, heart rate and rhythm, breathing rate and rhythm, the flow of air through your mouth and nose, snoring, body muscle movements, and chest and belly movement.  Your physician has recommended that you have a pulmonary function test. Pulmonary Function Tests are a group of tests that measure how well air moves in and out of your lungs.  Your physician has recommended that you have a pulmonary perfusion and ventilation scan (VQ) scan.   Your physician has requested that you have an echocardiogram in 6 months. Echocardiography is a painless test that uses sound waves to create images of your heart. It provides your doctor with information about the size and shape of your heart and how well your heart's chambers and valves are working. This procedure takes approximately one hour. There are no restrictions for this procedure.    Follow-Up: At [facility], you and your health needs are our priority.  As part of our continuing mission to provide you with exceptional heart care, we have created designated Provider Care Teams.  These Care Teams include your primary Cardiologist (physician) and Advanced Practice Providers (APPs -  Physician Assistants and Nurse Practitioners) who all work together to provide you with the care you need, when you need it.  Your next appointment:   6 month(s)  The format for your next appointment:   In Person  Provider:   You may see Fransico Him, MD or one of the following Advanced  Practice Providers on your designated Care Team:    Melina Copa, PA-C  Ermalinda Barrios, PA-C    Other Instructions:  Ventilation-Perfusion Scan  A ventilation-perfusion scan is a procedure to look at airflow and blood flow in the lungs. It is most often done to look for a blood clot that may have traveled to the lungs (pulmonary embolus). During this scan, radioactive compounds are injected into the bloodstream and are also breathed in. The compounds are not harmful. They are given at very low doses and stay in the body for a short time. After the compounds are injected and breathed in, a camera is used to scan the lungs. Tell a health care provider about:  Any allergies you have.  All medicines you are taking, including vitamins, herbs, eye drops, creams, and over-the-counter medicines.  Any blood disorders you have.  Any surgeries you have had.  Any medical conditions you have.  Whether you are pregnant or may be pregnant.  Whether or not you are breastfeeding. What are the risks? Generally, this is a safe procedure. However, problems may occur, including an allergic reaction to the radioactive compounds. What happens before the procedure?  Do not use any products that contain nicotine or tobacco, such as cigarettes and e-cigarettes. If you need help quitting, ask your health care provider.  Take over-the-counter and prescription medicines only as told by your health care provider. What happens during the procedure?  An IV will be inserted into one of your veins. The IV will stay in place for the entire exam.  A small amount of radioactive material will be  injected into your bloodstream.  Your lungs will be scanned with a camera. This camera will record the images.  You will be asked to inhale a second radioactive compound. Take deep breaths as directed by the health care provider.  Your lungs will be scanned again.  Your IV will be removed. The procedure may vary  among health care providers and hospitals. What happens after the procedure?  You may go home unless your health care provider instructs you differently.  You may continue with your normal activities and diet as instructed by your health care provider.  It is up to you to get the results of your procedure. Ask your health care provider, or the department that is doing the procedure, when your results will be ready. Summary  A ventilation-perfusion scan is a scan to look at airflow and blood flow in the lungs. It is most often done to look for blood clots that may have traveled to the lungs.  The radioactive compounds are not harmful.  Your lungs will be scanned with a camera. This camera will record the images.  It is up to you to get the results of your procedure. Ask your health care provider, or the department that is doing the procedure, when your results will be ready. This information is not intended to replace advice given to you by your health care provider. Make sure you discuss any questions you have with your health care provider. Document Revised: 03/31/2017 Document Reviewed: 05/26/2016 Elsevier Patient Education  2020 [employer].

## 2019-09-11 ENCOUNTER — Telehealth: Payer: Self-pay | Admitting: Cardiology

## 2019-09-11 NOTE — Telephone Encounter (Signed)
   [facility] Pre-operative Risk Assessment    Request for surgical clearance:  1. What type of surgery is being performed? Procedure for Kidney Stones (this is what form said)    2. When is this surgery scheduled?  TBD   3. What type of clearance is required (medical clearance vs. Pharmacy clearance to hold med vs. Both)?  Both  4. Are there any medications that need to be held prior to surgery and how long? Brilinta   5. Practice name and name of physician performing surgery?  [facility]   6. What is your office phone number? [phone]    7.   What is your office fax number? [phone]  8.   Anesthesia type (None, local, MAC, general) ? Not mentioned  _________________________________________________________________   (provider comments below)

## 2019-09-11 NOTE — Telephone Encounter (Signed)
Spoke with Caryl Pina, who states that the patient will not be having procedure done until June. He has a follow up appointment with urology on 06/09 to discuss the procedure. Caryl Pina will be faxing over clearance form.

## 2019-09-11 NOTE — Telephone Encounter (Signed)
Left a message with Dr. Lynne Logan nurse to call me back.

## 2019-09-12 ENCOUNTER — Ambulatory Visit (HOSPITAL_COMMUNITY): Payer: Medicare Other

## 2019-09-12 NOTE — Telephone Encounter (Signed)
I called [facility] to confirm procedure that cardiac clearance is being requested for. I s/w a surgery scheduler and she said she did not send the clearance and that I needed to s/w Dr. Lynne Logan nurse. I then left a message for Dr. Lynne Logan nurse to please call our office as the cardiologist is needing clarification as to the procedure being done. When I s/w surgery scheduler she read MD note to me that sounds like pt is having a procedure with stent placed Interventional Radiology, which then he may require a possible lithotripsy maybe in June.

## 2019-09-13 NOTE — Telephone Encounter (Signed)
Follow up  [name] from [facility], she said to discard the clearance request. According to Dr. Karie Georges it is Dr. Pascal Lux will be doing a procedure and the pt doesn't need to stop any medications. She said if there's any question to call her at [phone] option 6

## 2019-09-13 NOTE — Telephone Encounter (Signed)
See note from [facility] with [facility]. Per note from [facility] I will remove from the pre op call back pool.

## 2019-09-16 ENCOUNTER — Other Ambulatory Visit: Payer: Self-pay | Admitting: Radiology

## 2019-09-17 ENCOUNTER — Other Ambulatory Visit: Payer: Self-pay

## 2019-09-17 ENCOUNTER — Ambulatory Visit (HOSPITAL_COMMUNITY)
Admission: RE | Admit: 2019-09-17 | Discharge: 2019-09-17 | Disposition: A | Discharge status: Home / Self Care | Payer: Medicare Other | Source: Ambulatory Visit | Attending: Urology | Admitting: Urology

## 2019-09-17 ENCOUNTER — Encounter (HOSPITAL_COMMUNITY): Payer: Self-pay

## 2019-09-17 DIAGNOSIS — N201 Calculus of ureter: Secondary | ICD-10-CM

## 2019-09-17 DIAGNOSIS — N400 Enlarged prostate without lower urinary tract symptoms: Secondary | ICD-10-CM | POA: Diagnosis not present

## 2019-09-17 DIAGNOSIS — Z436 Encounter for attention to other artificial openings of urinary tract: Secondary | ICD-10-CM | POA: Insufficient documentation

## 2019-09-17 HISTORY — PX: IR CONVERT LEFT NEPHROSTOMY TO NEPHROURETERAL CATH: IMG6067

## 2019-09-17 LAB — BASIC METABOLIC PANEL
Anion gap: 10 (ref 5–15)
BUN: 19 mg/dL (ref 8–23)
CO2: 19 mmol/L — ABNORMAL LOW (ref 22–32)
Calcium: 9.1 mg/dL (ref 8.9–10.3)
Chloride: 108 mmol/L (ref 98–111)
Creatinine, Ser: 1.79 mg/dL — ABNORMAL HIGH (ref 0.61–1.24)
GFR calc Af Amer: 42 mL/min — ABNORMAL LOW (ref >60)
GFR calc non Af Amer: 36 mL/min — ABNORMAL LOW (ref >60)
Glucose, Bld: 104 mg/dL — ABNORMAL HIGH (ref 70–99)
Potassium: 4.9 mmol/L (ref 3.5–5.1)
Sodium: 137 mmol/L (ref 135–145)

## 2019-09-17 LAB — CBC WITH DIFFERENTIAL/PLATELET
Abs Immature Granulocytes: 0.08 10*3/uL — ABNORMAL HIGH (ref 0.00–0.07)
Basophils Absolute: 0.1 10*3/uL (ref 0.0–0.1)
Basophils Relative: 1 %
Eosinophils Absolute: 0.3 10*3/uL (ref 0.0–0.5)
Eosinophils Relative: 3 %
HCT: 42.3 % (ref 39.0–52.0)
Hemoglobin: 14 g/dL (ref 13.0–17.0)
Immature Granulocytes: 1 %
Lymphocytes Relative: 10 %
Lymphs Abs: 1.1 10*3/uL (ref 0.7–4.0)
MCH: 29.3 pg (ref 26.0–34.0)
MCHC: 33.1 g/dL (ref 30.0–36.0)
MCV: 88.5 fL (ref 80.0–100.0)
Monocytes Absolute: 1 10*3/uL (ref 0.1–1.0)
Monocytes Relative: 9 %
Neutro Abs: 8.7 10*3/uL — ABNORMAL HIGH (ref 1.7–7.7)
Neutrophils Relative %: 76 %
Platelets: 270 10*3/uL (ref 150–400)
RBC: 4.78 MIL/uL (ref 4.22–5.81)
RDW: 15.6 % — ABNORMAL HIGH (ref 11.5–15.5)
WBC: 11.2 10*3/uL — ABNORMAL HIGH (ref 4.0–10.5)
nRBC: 0 % (ref 0.0–0.2)

## 2019-09-17 LAB — PROTIME-INR
INR: 1.1 (ref 0.8–1.2)
Prothrombin Time: 13.6 seconds (ref 11.4–15.2)

## 2019-09-17 MED ORDER — CEFAZOLIN SODIUM-DEXTROSE 2-4 GM/100ML-% IV SOLN
INTRAVENOUS | Status: AC
  Administered 2019-09-17: 2 g via INTRAVENOUS
  Filled 2019-09-17: qty 100

## 2019-09-17 MED ORDER — LIDOCAINE HCL (PF) 1 % IJ SOLN
INTRAMUSCULAR | Status: AC | PRN
  Administered 2019-09-17 (×2): 10 mL via INTRADERMAL

## 2019-09-17 MED ORDER — MIDAZOLAM HCL 2 MG/2ML IJ SOLN
INTRAMUSCULAR | Status: AC
  Filled 2019-09-17: qty 2

## 2019-09-17 MED ORDER — IOHEXOL 300 MG/ML  SOLN
50.0000 mL | Freq: Once | INTRAMUSCULAR | Status: AC | PRN
  Administered 2019-09-17: 25 mL

## 2019-09-17 MED ORDER — SODIUM CHLORIDE 0.9 % IV SOLN: INTRAVENOUS | Status: DC

## 2019-09-17 MED ORDER — LIDOCAINE HCL 1 % IJ SOLN
INTRAMUSCULAR | Status: AC
  Filled 2019-09-17: qty 20

## 2019-09-17 MED ORDER — CEFAZOLIN SODIUM-DEXTROSE 2-4 GM/100ML-% IV SOLN: 2.0000 g | INTRAVENOUS | Status: AC

## 2019-09-17 MED ORDER — FENTANYL CITRATE (PF) 100 MCG/2ML IJ SOLN
INTRAMUSCULAR | Status: AC | PRN
  Administered 2019-09-17 (×2): 50 ug via INTRAVENOUS

## 2019-09-17 MED ORDER — FENTANYL CITRATE (PF) 100 MCG/2ML IJ SOLN
INTRAMUSCULAR | Status: AC
  Filled 2019-09-17: qty 2

## 2019-09-17 MED ORDER — MIDAZOLAM HCL 2 MG/2ML IJ SOLN
INTRAMUSCULAR | Status: AC | PRN
  Administered 2019-09-17: 0.5 mg via INTRAVENOUS
  Administered 2019-09-17 (×2): 1 mg via INTRAVENOUS

## 2019-09-17 NOTE — Discharge Instructions (Signed)
Ureteral Stent Implantation, Care After This sheet gives you information about how to care for yourself after your procedure. Your health care provider may also give you more specific instructions. If you have problems or questions, contact your health care provider. What can I expect after the procedure? After the procedure, it is common to have:  Nausea.  Mild pain when you urinate. You may feel this pain in your lower back or lower abdomen. The pain should stop within a few minutes after you urinate. This may last for up to 1 week.  A small amount of blood in your urine for several days. Follow these instructions at home: Medicines  Take over-the-counter and prescription medicines only as told by your health care provider.  If you were prescribed an antibiotic medicine, take it as told by your health care provider. Do not stop taking the antibiotic even if you start to feel better.  Do not drive for 24 hours if you were given a sedative during your procedure.  Ask your health care provider if the medicine prescribed to you requires you to avoid driving or using heavy machinery. Activity  Rest as told by your health care provider.  Avoid sitting for a long time without moving. Get up to take short walks every 1-2 hours. This is important to improve blood flow and breathing. Ask for help if you feel weak or unsteady.  Return to your normal activities as told by your health care provider. Ask your health care provider what activities are safe for you. General instructions   Watch for any blood in your urine. Call your health care provider if the amount of blood in your urine increases.  If you have a catheter: ? Follow instructions from your health care provider about taking care of your catheter and collection bag. ? Do not take baths, swim, or use a hot tub until your health care provider approves. Ask your health care provider if you may take showers. You may only be allowed to  take sponge baths.  Drink enough fluid to keep your urine pale yellow.  Do not use any products that contain nicotine or tobacco, such as cigarettes, e-cigarettes, and chewing tobacco. These can delay healing after surgery. If you need help quitting, ask your health care provider.  Keep all follow-up visits as told by your health care provider. This is important. Contact a health care provider if:  You have pain that gets worse or does not get better with medicine, especially pain when you urinate.  You have difficulty urinating.  You feel nauseous or you vomit repeatedly during a period of more than 2 days after the procedure. Get help right away if:  Your urine is dark red or has blood clots in it.  You are leaking urine (have incontinence).  The end of the stent comes out of your urethra.  You cannot urinate.  You have sudden, sharp, or severe pain in your abdomen or lower back.  You have a fever.  You have swelling or pain in your legs.  You have difficulty breathing. Summary  After the procedure, it is common to have mild pain when you urinate that goes away within a few minutes after you urinate. This may last for up to 1 week.  Watch for any blood in your urine. Call your health care provider if the amount of blood in your urine increases.  Take over-the-counter and prescription medicines only as told by your health care provider.  Drink   enough fluid to keep your urine pale yellow. This information is not intended to replace advice given to you by your health care provider. Make sure you discuss any questions you have with your health care provider. Document Revised: 01/23/2018 Document Reviewed: 01/24/2018 Elsevier Patient Education  2020 Elsevier Inc.    Moderate Conscious Sedation, Adult, Care After These instructions provide you with information about caring for yourself after your procedure. Your health care provider may also give you more specific  instructions. Your treatment has been planned according to current medical practices, but problems sometimes occur. Call your health care provider if you have any problems or questions after your procedure. What can I expect after the procedure? After your procedure, it is common:  To feel sleepy for several hours.  To feel clumsy and have poor balance for several hours.  To have poor judgment for several hours.  To vomit if you eat too soon. Follow these instructions at home: For at least 24 hours after the procedure:   Do not: ? Participate in activities where you could fall or become injured. ? Drive. ? Use heavy machinery. ? Drink alcohol. ? Take sleeping pills or medicines that cause drowsiness. ? Make important decisions or sign legal documents. ? Take care of children on your own.  Rest. Eating and drinking  Follow the diet recommended by your health care provider.  If you vomit: ? Drink water, juice, or soup when you can drink without vomiting. ? Make sure you have little or no nausea before eating solid foods. General instructions  Have a responsible adult stay with you until you are awake and alert.  Take over-the-counter and prescription medicines only as told by your health care provider.  If you smoke, do not smoke without supervision.  Keep all follow-up visits as told by your health care provider. This is important. Contact a health care provider if:  You keep feeling nauseous or you keep vomiting.  You feel light-headed.  You develop a rash.  You have a fever. Get help right away if:  You have trouble breathing. This information is not intended to replace advice given to you by your health care provider. Make sure you discuss any questions you have with your health care provider. Document Revised: 03/31/2017 Document Reviewed: 08/08/2015 Elsevier Patient Education  2020 Elsevier Inc.  

## 2019-09-17 NOTE — H&P (Signed)
Referring Physician(s): Borden,Lester  Supervising Physician: Sandi Mariscal  Patient Status:  [facility]  Chief Complaint:  "I'm here to get my catheter looked at"  Subjective: Patient familiar to IR service from attempted left nephrostomy on 02/05/2019 and placement of left PCN on 08/06/2019.  He has a history of nephrolithiasis and attempted though ultimately unsuccessful left nephrostomy secondary to lack of hydronephrosis and extreme obesity on 02/05/2019. He also has a history of failed cystoscopic management of the left kidney stone diseasedue to prostatomegaly andvery difficult lower ureteral access.[facility] of weakness, foul-smelling urine,left flank discomfort, occfever and chills; status post left percutaneous nephrostomy on 4/6 with some transient bacteremic symptoms afterwards followed by subsequent MI/need for coronary stenting. He is currently on Brilinita/ASA.  He presents today for conversion of nephrostomy to nephroureteral catheter/stent.  He currently denies fever, headache, chest pain, dyspnea, cough, abdominal pain, nausea, vomiting or bleeding.  He does have low back pain.  Additional history as below.  Past Medical History:  Diagnosis Date  . Chronic kidney disease    RENAL STONES  . High frequency hearing loss   . History of migraine headaches   . Hx of colonic polyps   . Obesity   . Seborrheic dermatitis   . Vitiligo    Past Surgical History:  Procedure Laterality Date  . COLON SURGERY  01/2001  . CORONARY STENT INTERVENTION N/A 08/12/2019   Procedure: CORONARY STENT INTERVENTION;  Surgeon: Nelva Bush, MD;  Location: [facility];  Service: Cardiovascular;  Laterality: N/A;  . CYSTOSCOPY/URETEROSCOPY/HOLMIUM LASER/STENT PLACEMENT Left 02/04/2019   Procedure: CYSTOSCOPY Left retrograde and attempted left ureteroscopy;  Surgeon: Raynelle Bring, MD;  Location: [facility];  Service: Urology;  Laterality:  Left;  ONLY NEEDS 60 MIN  . FRACTURE SURGERY    . INTRAVASCULAR ULTRASOUND/IVUS N/A 08/12/2019   Procedure: Intravascular Ultrasound/IVUS;  Surgeon: Nelva Bush, MD;  Location: [facility];  Service: Cardiovascular;  Laterality: N/A;  . IR NEPHROSTOMY PLACEMENT LEFT  02/05/2019  . IR NEPHROSTOMY PLACEMENT LEFT  08/06/2019  . LEFT HEART CATH AND CORONARY ANGIOGRAPHY N/A 08/09/2019   Procedure: LEFT HEART CATH AND CORONARY ANGIOGRAPHY;  Surgeon: Jettie Booze, MD;  Location: [facility];  Service: Cardiovascular;  Laterality: N/A;  . TONSILLECTOMY AND ADENOIDECTOMY         Allergies: Codeine  Medications: Prior to Admission medications   Medication Sig Start Date End Date Taking? Authorizing Provider  aspirin EC 81 MG EC tablet Take 1 tablet (81 mg total) by mouth daily. 08/14/19  Yes Nita Sells, MD  lisinopril (ZESTRIL) 5 MG tablet Take 1 tablet (5 mg total) by mouth daily. 08/19/19  Yes Burtis Junes, NP  metoprolol succinate (TOPROL-XL) 50 MG 24 hr tablet Take 1 tablet (50 mg total) by mouth daily. Take with or immediately following a meal. 08/19/19  Yes Burtis Junes, NP  rosuvastatin (CRESTOR) 40 MG tablet Take 1 tablet (40 mg total) by mouth daily at 6 PM. 08/19/19  Yes Burtis Junes, NP  ticagrelor (BRILINTA) 90 MG TABS tablet Take 1 tablet (90 mg total) by mouth 2 (two) times daily. 08/19/19  Yes Burtis Junes, NP  nitroGLYCERIN (NITROSTAT) 0.4 MG SL tablet Place 1 tablet (0.4 mg total) under the tongue every 5 (five) minutes as needed for chest pain. 08/19/19 11/17/19  Burtis Junes, NP     Vital Signs: BP 123/70   Pulse (!) 117   Temp  98.2 F (36.8 C) (Oral)   Resp 20   SpO2 97%   Physical Exam patient awake, alert.  Chest clear to auscultation bilaterally anteriorly.  Heart with regular rate and rhythm.  Abdomen obese, soft, positive bowel sounds, nontender.  Left nephrostomy catheter intact, insertion site okay, mildly tender to  palpation, yellow urine gravity bag.  Trace lower extremity edema noted.  Imaging: No results found.  Labs:  CBC: Recent Labs    08/12/19 0359 08/13/19 0354 08/19/19 1013 09/17/19 1245  WBC 10.1 8.8 9.2 11.2*  HGB 13.5 12.5* 14.6 14.0  HCT 41.6 37.5* 43.4 42.3  PLT 297 289 427 270    COAGS: Recent Labs    02/04/19 1354 08/05/19 0856  INR 1.0 1.1    BMP: Recent Labs    08/12/19 0359 08/13/19 0354 08/19/19 1013 09/17/19 1245  NA 139 136 138 137  K 4.4 3.5 4.8 4.9  CL 104 104 102 108  CO2 24 23 21  19*  GLUCOSE 105* 115* 90 104*  BUN 14 12 11 19   CALCIUM 8.8* 8.2* 9.3 9.1  CREATININE 1.13 0.97 1.02 1.79*  GFRNONAA >60 >60 72 36*  GFRAA >60 >60 83 42*    LIVER FUNCTION TESTS: Recent Labs    08/04/19 1702 08/04/19 1702 08/05/19 0515 08/07/19 0248 08/11/19 0215 08/13/19 0354  BILITOT 1.1  --  1.0 0.9  --   --   AST 16  --  13* 20  --   --   ALT 16  --  14 15  --   --   ALKPHOS 106  --  87 77  --   --   PROT 6.8  --  5.8* 5.4*  --   --   ALBUMIN 3.3*   < > 2.7* 2.4* 2.4* 2.4*   < > = values in this interval not displayed.    Assessment and Plan: 74 yo male with history of nephrolithiasis and attempted though ultimately unsuccessful left nephrostomy secondary to lack of hydronephrosis and extreme obesity on 02/05/2019. He also has a history of failed cystoscopic management of the left kidney stone diseasedue to prostatomegaly andvery difficult lower ureteral access.[facility] of weakness, foul-smelling urine,left flank discomfort, occfever and chills; status post left percutaneous nephrostomy on 4/6 with some transient bacteremic symptoms afterwards followed by subsequent MI/need for coronary stenting. He is currently on Brilinita/ASA.  He presents today for conversion of nephrostomy to nephroureteral catheter/stent.  Details/risks of procedure, including not limited to, internal bleeding, infection,  injury to adjacent structures, inability to place stent discussed with patient with his understanding and consent.   Electronically Signed: D. Rowe Robert, PA-C 09/17/2019, 1:20 PM   I spent a total of 20 minutes at the the patient's bedside AND on the patient's hospital floor or unit, greater than 50% of which was counseling/coordinating care for conversion of left nephrostomy to nephroureteral catheter/stent

## 2019-09-17 NOTE — Sedation Documentation (Signed)
Patient is resting comfortably, snoring lightly, in NAD. 

## 2019-09-19 ENCOUNTER — Ambulatory Visit (HOSPITAL_COMMUNITY): Payer: Medicare Other

## 2019-09-19 ENCOUNTER — Other Ambulatory Visit: Payer: Self-pay | Admitting: Cardiology

## 2019-09-19 ENCOUNTER — Encounter (HOSPITAL_COMMUNITY): Admission: RE | Admit: 2019-09-19 | Payer: Medicare Other | Source: Ambulatory Visit

## 2019-09-19 ENCOUNTER — Ambulatory Visit (HOSPITAL_COMMUNITY)
Admission: RE | Admit: 2019-09-19 | Discharge: 2019-09-19 | Disposition: A | Discharge status: Home / Self Care | Payer: Medicare Other | Source: Ambulatory Visit | Attending: Cardiology | Admitting: Cardiology

## 2019-09-19 ENCOUNTER — Other Ambulatory Visit: Payer: Self-pay

## 2019-09-19 DIAGNOSIS — I2510 Atherosclerotic heart disease of native coronary artery without angina pectoris: Secondary | ICD-10-CM

## 2019-09-19 DIAGNOSIS — I2583 Coronary atherosclerosis due to lipid rich plaque: Secondary | ICD-10-CM

## 2019-09-19 DIAGNOSIS — I10 Essential (primary) hypertension: Secondary | ICD-10-CM | POA: Diagnosis present

## 2019-09-19 DIAGNOSIS — E7800 Pure hypercholesterolemia, unspecified: Secondary | ICD-10-CM

## 2019-09-19 DIAGNOSIS — I272 Pulmonary hypertension, unspecified: Secondary | ICD-10-CM | POA: Diagnosis not present

## 2019-09-19 DIAGNOSIS — I255 Ischemic cardiomyopathy: Secondary | ICD-10-CM | POA: Diagnosis not present

## 2019-09-19 MED ORDER — TECHNETIUM TO 99M ALBUMIN AGGREGATED
1.6000 | Freq: Once | INTRAVENOUS | Status: AC | PRN
  Administered 2019-09-19: 1.6 via INTRAVENOUS

## 2019-09-20 ENCOUNTER — Telehealth (HOSPITAL_COMMUNITY): Payer: Self-pay | Admitting: *Deleted

## 2019-09-20 NOTE — Telephone Encounter (Signed)
Called and spoke to pt regarding anticipated follow up from his procedure to convert his nephrostomy to nephroureteral catheter/stent in hopes to determine when he may appropriate to participate in cardiac rehab.  Pt indicated that he could not financially afford to attend. Advised pt to aim for 30-45 minutes of exercise a day when his discomfort subsides. Pt thanked me for the call. Cherre Huger, BSN Cardiac and Training and development officer

## 2019-10-11 ENCOUNTER — Other Ambulatory Visit: Payer: Self-pay | Admitting: Urology

## 2019-10-15 ENCOUNTER — Telehealth: Payer: Self-pay | Admitting: *Deleted

## 2019-10-15 NOTE — Telephone Encounter (Signed)
Staff message sent to [place]. Ok to schedule HST. Per [employer] no PA is required.

## 2019-10-18 NOTE — Telephone Encounter (Signed)
Patient is aware and agreeable to Home Sleep Study through [facility]. Patient is scheduled for 12/31/19 at 1 pm to pick up home sleep kit and meet with Respiratory therapist at [facility]. Patient is aware that if this appointment date and time does not work for them they should contact [facility] directly at [phone]. Patient is aware that a sleep packet will be sent from [facility] in week. Patient is agreeable to treatment and thankful for call.  

## 2019-10-19 ENCOUNTER — Telehealth (HOSPITAL_COMMUNITY): Payer: Self-pay | Admitting: *Deleted

## 2019-10-19 ENCOUNTER — Other Ambulatory Visit (HOSPITAL_COMMUNITY): Payer: Medicare Other

## 2019-10-19 NOTE — Telephone Encounter (Signed)
Patient called stating he needed to cancel COVID testing today due to kidney infection. Appointment cancelled and requested he contact [facility] to let them know of COVID test cancellation and of kidney infection.

## 2019-10-23 NOTE — Patient Instructions (Addendum)
DUE TO COVID-19 ONLY ONE VISITOR IS ALLOWED TO COME WITH YOU AND STAY IN THE WAITING ROOM ONLY DURING PRE OP AND PROCEDURE DAY OF SURGERY. THE 1 VISITOR MAY VISIT WITH YOU AFTER SURGERY IN YOUR PRIVATE ROOM DURING VISITING HOURS ONLY!  YOU NEED TO HAVE A COVID 19 TEST ON 11-05-19 @ 9:15 AM, THIS TEST MUST BE DONE BEFORE SURGERY, COME  [place], [address] , [id number].  ([place]) ONCE YOUR COVID TEST IS COMPLETED, PLEASE BEGIN THE QUARANTINE INSTRUCTIONS AS OUTLINED IN YOUR HANDOUT.                Fabio Wah  10/23/2019   Your procedure is scheduled on: 11-07-19   Report to [facility] Main  Entrance    Report to Admitting at 5:30 AM     Call this number if you have problems the morning of surgery [phone]    Remember: Do not eat food or drink liquids :After Midnight.      Take these medicines the morning of surgery with A SIP OF WATER: Metoprolol Succinate (Toprol)  BRUSH YOUR TEETH MORNING OF SURGERY AND RINSE YOUR MOUTH OUT, NO CHEWING GUM CANDY OR MINTS.                                You may not have any metal on your body including hair pins and              piercings     Do not wear jewelry, make-up, lotions, powders or perfumes, deodorant              Do not wear nail polish on your fingernails.  Do not shave  48 hours prior to surgery.       Do not bring valuables to the hospital. [place].  Contacts, dentures or bridgework may not be worn into surgery.  You may bring small overnight bag     Special Instructions: N/A              Please read over the following fact sheets you were given: _____________________________________________________________________             [facility] - Preparing for Surgery Before surgery, you can play an important role.  Because skin is not sterile, your skin needs to be as free of germs as possible.  You can reduce the number of germs on  your skin by washing with CHG (chlorahexidine gluconate) soap before surgery.  CHG is an antiseptic cleaner which kills germs and bonds with the skin to continue killing germs even after washing. Please DO NOT use if you have an allergy to CHG or antibacterial soaps.  If your skin becomes reddened/irritated stop using the CHG and inform your nurse when you arrive at Short Stay. Do not shave (including legs and underarms) for at least 48 hours prior to the first CHG shower.  You may shave your face/neck. Please follow these instructions carefully:  1.  Shower with CHG Soap the night before surgery and the  morning of Surgery.  2.  If you choose to wash your hair, wash your hair first as usual with your  normal  shampoo.  3.  After you shampoo, rinse your hair and body thoroughly to remove the  shampoo.  4.  Use CHG as you would any other liquid soap.  You can apply chg directly  to the skin and wash                       Gently with a scrungie or clean washcloth.  5.  Apply the CHG Soap to your body ONLY FROM THE NECK DOWN.   Do not use on face/ open                           Wound or open sores. Avoid contact with eyes, ears mouth and genitals (private parts).                       Wash face,  Genitals (private parts) with your normal soap.             6.  Wash thoroughly, paying special attention to the area where your surgery  will be performed.  7.  Thoroughly rinse your body with warm water from the neck down.  8.  DO NOT shower/wash with your normal soap after using and rinsing off  the CHG Soap.                9.  Pat yourself dry with a clean towel.            10.  Wear clean pajamas.            11.  Place clean sheets on your bed the night of your first shower and do not  sleep with pets. Day of Surgery : Do not apply any lotions/deodorants the morning of surgery.  Please wear clean clothes to the hospital/surgery center.  FAILURE TO FOLLOW THESE INSTRUCTIONS MAY  RESULT IN THE CANCELLATION OF YOUR SURGERY PATIENT SIGNATURE_________________________________  NURSE SIGNATURE__________________________________  ________________________________________________________________________

## 2019-10-23 NOTE — Progress Notes (Addendum)
PCP - Anastasia Pall, MD  Cardiologist - Fransico Him, MD  PPM/ICD -  Device Orders -  Rep Notified -   Chest x-ray - 09-19-19  EKG - 08-09-19 Stress Test -  ECHO -  Cardiac Cath -   Sleep Study -  CPAP -   Fasting Blood Sugar -  Checks Blood Sugar _____ times a day  Blood Thinner Instructions: Brilinta- pt verbalized that he is to hold 5 days prior Aspirin Instructions: 81 mg (not told to hold)   ERAS Protcol - PRE-SURGERY Ensure or G2-   COVID TEST-  COVID Vaccination x 2 Moderna  Able to perform ADL without SOB  Anesthesia review:   Patient denies shortness of breath, fever, cough and chest pain at PAT appointment   All instructions explained to the patient, with a verbal understanding of the material. Patient agrees to go over the instructions while at home for a better understanding. Patient also instructed to self quarantine after being tested for COVID-19. The opportunity to ask questions was provided.

## 2019-10-25 ENCOUNTER — Other Ambulatory Visit: Payer: Self-pay

## 2019-10-25 ENCOUNTER — Encounter (HOSPITAL_COMMUNITY): Payer: Self-pay

## 2019-10-25 ENCOUNTER — Inpatient Hospital Stay (HOSPITAL_COMMUNITY): Admission: RE | Admit: 2019-10-25 | Payer: Medicare Other | Source: Ambulatory Visit

## 2019-10-25 ENCOUNTER — Encounter (HOSPITAL_COMMUNITY)
Admission: RE | Admit: 2019-10-25 | Discharge: 2019-10-25 | Disposition: A | Discharge status: Home / Self Care | Payer: Medicare Other | Source: Ambulatory Visit | Attending: Urology | Admitting: Urology

## 2019-10-25 DIAGNOSIS — Z01812 Encounter for preprocedural laboratory examination: Secondary | ICD-10-CM | POA: Insufficient documentation

## 2019-10-25 HISTORY — DX: Pulmonary hypertension, unspecified: I27.20

## 2019-10-25 HISTORY — DX: Atherosclerotic heart disease of native coronary artery without angina pectoris: I25.10

## 2019-10-25 HISTORY — DX: Essential (primary) hypertension: I10

## 2019-10-25 HISTORY — DX: Hyperlipidemia, unspecified: E78.5

## 2019-10-25 HISTORY — DX: Heart failure, unspecified: I50.9

## 2019-10-25 LAB — CBC
HCT: 41.8 % (ref 39.0–52.0)
Hemoglobin: 13.3 g/dL (ref 13.0–17.0)
MCH: 29.4 pg (ref 26.0–34.0)
MCHC: 31.8 g/dL (ref 30.0–36.0)
MCV: 92.3 fL (ref 80.0–100.0)
Platelets: 296 10*3/uL (ref 150–400)
RBC: 4.53 MIL/uL (ref 4.22–5.81)
RDW: 15.2 % (ref 11.5–15.5)
WBC: 10.4 10*3/uL (ref 4.0–10.5)
nRBC: 0 % (ref 0.0–0.2)

## 2019-10-25 LAB — BASIC METABOLIC PANEL
Anion gap: 6 (ref 5–15)
BUN: 19 mg/dL (ref 8–23)
CO2: 23 mmol/L (ref 22–32)
Calcium: 8.9 mg/dL (ref 8.9–10.3)
Chloride: 107 mmol/L (ref 98–111)
Creatinine, Ser: 1.93 mg/dL — ABNORMAL HIGH (ref 0.61–1.24)
GFR calc Af Amer: 38 mL/min — ABNORMAL LOW (ref >60)
GFR calc non Af Amer: 33 mL/min — ABNORMAL LOW (ref >60)
Glucose, Bld: 97 mg/dL (ref 70–99)
Potassium: 4.5 mmol/L (ref 3.5–5.1)
Sodium: 136 mmol/L (ref 135–145)

## 2019-10-25 LAB — TYPE AND SCREEN
ABO/RH(D): A NEG
Antibody Screen: NEGATIVE

## 2019-10-28 ENCOUNTER — Encounter (HOSPITAL_COMMUNITY): Payer: Medicare Other

## 2019-10-28 ENCOUNTER — Encounter (HOSPITAL_COMMUNITY): Payer: Self-pay

## 2019-10-28 NOTE — Anesthesia Preprocedure Evaluation (Addendum)
Anesthesia Evaluation  Patient identified by MRN, date of birth, ID band Patient awake    Reviewed: Allergy & Precautions, NPO status , Patient's Chart, lab work & pertinent test results, reviewed documented beta blocker date and time   Airway Mallampati: II  TM Distance: >3 FB Neck ROM: Full    Dental  (+) Teeth Intact, Dental Advisory Given, Missing,    Pulmonary former smoker,    breath sounds clear to auscultation       Cardiovascular hypertension, Pt. on medications and Pt. on home beta blockers + CAD, + Cardiac Stents and +CHF   Rhythm:Regular Rate:Normal     Neuro/Psych negative neurological ROS  negative psych ROS   GI/Hepatic negative GI ROS, Neg liver ROS,   Endo/Other  negative endocrine ROS  Renal/GU Renal InsufficiencyRenal disease     Musculoskeletal negative musculoskeletal ROS (+)   Abdominal (+) + obese,   Peds  Hematology negative hematology ROS (+)   Anesthesia Other Findings   Reproductive/Obstetrics                          Anesthesia Physical Anesthesia Plan  ASA: III  Anesthesia Plan: General   Post-op Pain Management:    Induction: Intravenous  PONV Risk Score and Plan: 3 and Ondansetron and Treatment may vary due to age or medical condition  Airway Management Planned: Oral ETT  Additional Equipment: None  Intra-op Plan:   Post-operative Plan: Extubation in OR  Informed Consent: I have reviewed the patients History and Physical, chart, labs and discussed the procedure including the risks, benefits and alternatives for the proposed anesthesia with the patient or authorized representative who has indicated his/her understanding and acceptance.     Dental advisory given  Plan Discussed with: CRNA  Anesthesia Plan Comments: (See APP note by Durel Salts, FNP)      Anesthesia Quick Evaluation

## 2019-10-28 NOTE — Progress Notes (Signed)
Anesthesia Chart Review:   Case: [id number] Date/Time: 11/07/19 0715   Procedure: CYSTOSCOPY/URETEROSCOPY/HOLMIUM LASER/STENT PLACEMENT/ POSSIBLE PERCUTANEOUS NEPHROLITHOTOMY (Left )   Anesthesia type: General   Pre-op diagnosis: LEFT URETERAL STONE   Location: [facility] ROOM 03 / [facility]   Surgeons: Raynelle Bring, MD      DISCUSSION:  Pt is a 74 year old with hx CAD (DES to RCA and LCx 08/12/19), CHF (EF 45-50%), pulmonary hypertension, HTN, CKD  Hospitalized  4/4-4/13/21 for infected renal calculus. Complicated by AKI, NSTEMI (found to have 3 vessel CAD treated with DES x2 and medical management (no target for CABG).   Pt to hold Brilinta 5 days prior to surgery - As planned, pt has had >6 weeks of DAPT after PCI/DES prior to surgery   - Cr 1.93, usual baseline ~1-1.2.    Reviewed case with Dr. Daiva Huge.    PROVIDERS: - PCP is [name], MD  - Cardiologist is Fransico Him, MD. Last office visit 09/10/19. Dr. Radford Pax is aware of upcoming surgery and has given ok to hold Brilinta for 5 days prior   LABS:  - BMP on 10/25/19 showed Cr 1.93. Otherwise normal. Pt with recent infected renal stone. Baseline Cr ~1-1.2 - CBC on 10/25/19 was normal    IMAGES:  Pulmonary Perfusion NM scan 09/19/19:  - Pulmonary embolism absent (very low probability of pulmonary embolism).  CXR 09/19/19: Low lung volumes.  No acute cardiopulmonary disease.    EKG 08/19/19: sinus bradycardia   CV:  Coronary stent intervention 08/12/19:  1. Severe three-vessel coronary artery disease, as documented on last weeks diagnostic catheterization. 2. Successful IVUS-guided PCI of the proximal LCx using Synergy 3.5 x 16 mm drug-eluting stent with 0% residual stenosis and TIMI-3 flow. 3. Successful IVUS-guided PCI to proximal RCA using Synergy 4.0 x 20 mm drug eluting stent with 0% residual stenosis and TIMI-3 flow.   Cardiac cath 08/09/19:   Ramus lesion is 75% stenosed.  Mid LAD-1 lesion is 80%  stenosed.  Mid LAD-2 lesion is 99% stenosed.  Prox RCA lesion is 90% stenosed.  Mid RCA lesion is 25% stenosed.  Prox Cx lesion is 80% stenosed.  LV end diastolic pressure is moderately elevated.  There is no aortic valve stenosis.   Echo 08/07/19:  1. Left ventricular ejection fraction, by estimation, is 45 to 50%. The left ventricle has low normal function. The left ventricle demonstrates global hypokinesis. There is mild left ventricular hypertrophy. Left ventricular diastolic parameters are consistent with Grade I diastolic dysfunction (impaired relaxation). There is incoordinate septal motion.  2. Right ventricular systolic function is low normal. The right ventricular size is normal. There is moderately elevated pulmonary artery systolic pressure. The estimated right ventricular systolic pressure is 71.0 mmHg.  3. The mitral valve is abnormal. Trivial mitral valve regurgitation.  4. The aortic valve is tricuspid. Aortic valve regurgitation is not visualized.  5. The inferior vena cava is normal in size with greater than 50% respiratory variability, suggesting right atrial pressure of 3 mmHg.   Past Medical History:  Diagnosis Date  . Chronic kidney disease    RENAL STONES  . Coronary artery disease    Stent x 2  . High frequency hearing loss   . History of migraine headaches   . Hx of colonic polyps   . Obesity   . Seborrheic dermatitis   . Vitiligo     Past Surgical History:  Procedure Laterality Date  . COLON SURGERY  01/2001  . CORONARY  STENT INTERVENTION N/A 08/12/2019   Procedure: CORONARY STENT INTERVENTION;  Surgeon: Nelva Bush, MD;  Location: [place];  Service: Cardiovascular;  Laterality: N/A;  . CYSTOSCOPY/URETEROSCOPY/HOLMIUM LASER/STENT PLACEMENT Left 02/04/2019   Procedure: CYSTOSCOPY Left retrograde and attempted left ureteroscopy;  Surgeon: Raynelle Bring, MD;  Location[facility];  Service: Urology;  Laterality: Left;  ONLY NEEDS 60 MIN   . FRACTURE SURGERY    . INTRAVASCULAR ULTRASOUND/IVUS N/A 08/12/2019   Procedure: Intravascular Ultrasound/IVUS;  Surgeon: Nelva Bush, MD;  Location: [place];  Service: Cardiovascular;  Laterality: N/A;  . IR CONVERT LEFT NEPHROSTOMY TO NEPHROURETERAL CATH  09/17/2019  . IR NEPHROSTOMY PLACEMENT LEFT  02/05/2019  . IR NEPHROSTOMY PLACEMENT LEFT  08/06/2019  . LEFT HEART CATH AND CORONARY ANGIOGRAPHY N/A 08/09/2019   Procedure: LEFT HEART CATH AND CORONARY ANGIOGRAPHY;  Surgeon: Jettie Booze, MD;  Location: [facility];  Service: Cardiovascular;  Laterality: N/A;  . TONSILLECTOMY AND ADENOIDECTOMY      MEDICATIONS: . aspirin EC 81 MG EC tablet  . lisinopril (ZESTRIL) 5 MG tablet  . metoprolol succinate (TOPROL-XL) 50 MG 24 hr tablet  . nitroGLYCERIN (NITROSTAT) 0.4 MG SL tablet  . rosuvastatin (CRESTOR) 40 MG tablet  . ticagrelor (BRILINTA) 90 MG TABS tablet   No current facility-administered medications for this encounter.   - Pt to hold Brilinta 5 days prior to surgery   If no changes, I anticipate pt can proceed with surgery as scheduled.   Willeen Cass, FNP-BC [facility]/Anesthesiology Phone: [phone] 10/28/2019 4:26 PM

## 2019-11-05 ENCOUNTER — Other Ambulatory Visit (HOSPITAL_COMMUNITY)
Admission: RE | Admit: 2019-11-05 | Discharge: 2019-11-05 | Disposition: A | Discharge status: Home / Self Care | Payer: Medicare Other | Source: Ambulatory Visit | Attending: Urology | Admitting: Urology

## 2019-11-05 DIAGNOSIS — Z01812 Encounter for preprocedural laboratory examination: Secondary | ICD-10-CM | POA: Insufficient documentation

## 2019-11-05 DIAGNOSIS — Z20822 Contact with and (suspected) exposure to covid-19: Secondary | ICD-10-CM | POA: Insufficient documentation

## 2019-11-05 LAB — SARS CORONAVIRUS 2 (TAT 6-24 HRS): SARS Coronavirus 2: NEGATIVE

## 2019-11-07 ENCOUNTER — Ambulatory Visit (HOSPITAL_COMMUNITY): Payer: Medicare Other | Admitting: Certified Registered Nurse Anesthetist

## 2019-11-07 ENCOUNTER — Ambulatory Visit (HOSPITAL_COMMUNITY): Payer: Medicare Other

## 2019-11-07 ENCOUNTER — Ambulatory Visit (HOSPITAL_COMMUNITY)
Admission: RE | Admit: 2019-11-07 | Discharge: 2019-11-07 | Disposition: A | Discharge status: Home / Self Care | Payer: Medicare Other | Source: Other Acute Inpatient Hospital | Attending: Urology | Admitting: Urology

## 2019-11-07 ENCOUNTER — Ambulatory Visit (HOSPITAL_COMMUNITY): Payer: Medicare Other | Admitting: Emergency Medicine

## 2019-11-07 ENCOUNTER — Encounter (HOSPITAL_COMMUNITY)
Admission: RE | Disposition: A | Discharge status: Home / Self Care | Payer: Self-pay | Source: Other Acute Inpatient Hospital | Attending: Urology

## 2019-11-07 ENCOUNTER — Encounter (HOSPITAL_COMMUNITY): Payer: Self-pay | Admitting: Urology

## 2019-11-07 ENCOUNTER — Other Ambulatory Visit: Payer: Self-pay

## 2019-11-07 DIAGNOSIS — Z87891 Personal history of nicotine dependence: Secondary | ICD-10-CM | POA: Insufficient documentation

## 2019-11-07 DIAGNOSIS — Z8042 Family history of malignant neoplasm of prostate: Secondary | ICD-10-CM | POA: Diagnosis not present

## 2019-11-07 DIAGNOSIS — N132 Hydronephrosis with renal and ureteral calculous obstruction: Secondary | ICD-10-CM | POA: Diagnosis not present

## 2019-11-07 DIAGNOSIS — R739 Hyperglycemia, unspecified: Secondary | ICD-10-CM | POA: Diagnosis not present

## 2019-11-07 DIAGNOSIS — Z955 Presence of coronary angioplasty implant and graft: Secondary | ICD-10-CM | POA: Diagnosis not present

## 2019-11-07 DIAGNOSIS — I252 Old myocardial infarction: Secondary | ICD-10-CM | POA: Diagnosis not present

## 2019-11-07 DIAGNOSIS — Z79899 Other long term (current) drug therapy: Secondary | ICD-10-CM | POA: Insufficient documentation

## 2019-11-07 DIAGNOSIS — Z885 Allergy status to narcotic agent status: Secondary | ICD-10-CM | POA: Insufficient documentation

## 2019-11-07 DIAGNOSIS — Z9049 Acquired absence of other specified parts of digestive tract: Secondary | ICD-10-CM | POA: Insufficient documentation

## 2019-11-07 DIAGNOSIS — E669 Obesity, unspecified: Secondary | ICD-10-CM | POA: Diagnosis not present

## 2019-11-07 DIAGNOSIS — Z802 Family history of malignant neoplasm of other respiratory and intrathoracic organs: Secondary | ICD-10-CM | POA: Insufficient documentation

## 2019-11-07 DIAGNOSIS — Z7982 Long term (current) use of aspirin: Secondary | ICD-10-CM | POA: Diagnosis not present

## 2019-11-07 DIAGNOSIS — G608 Other hereditary and idiopathic neuropathies: Secondary | ICD-10-CM | POA: Diagnosis not present

## 2019-11-07 DIAGNOSIS — Z6836 Body mass index (BMI) 36.0-36.9, adult: Secondary | ICD-10-CM | POA: Diagnosis not present

## 2019-11-07 DIAGNOSIS — N201 Calculus of ureter: Secondary | ICD-10-CM | POA: Diagnosis present

## 2019-11-07 DIAGNOSIS — N400 Enlarged prostate without lower urinary tract symptoms: Secondary | ICD-10-CM | POA: Insufficient documentation

## 2019-11-07 HISTORY — PX: CYSTOSCOPY/URETEROSCOPY/HOLMIUM LASER/STENT PLACEMENT: SHX6546

## 2019-11-07 SURGERY — CYSTOSCOPY/URETEROSCOPY/HOLMIUM LASER/STENT PLACEMENT
Anesthesia: General | Laterality: Left

## 2019-11-07 MED ORDER — MEPERIDINE HCL 50 MG/ML IJ SOLN: 6.2500 mg | INTRAMUSCULAR | Status: DC | PRN

## 2019-11-07 MED ORDER — SUCCINYLCHOLINE CHLORIDE 200 MG/10ML IV SOSY
PREFILLED_SYRINGE | INTRAVENOUS | Status: DC | PRN
  Administered 2019-11-07: 140 mg via INTRAVENOUS

## 2019-11-07 MED ORDER — ACETAMINOPHEN 160 MG/5ML PO SOLN: 325.0000 mg | Freq: Once | ORAL | Status: DC | PRN

## 2019-11-07 MED ORDER — FENTANYL CITRATE (PF) 100 MCG/2ML IJ SOLN
INTRAMUSCULAR | Status: AC
  Filled 2019-11-07: qty 4

## 2019-11-07 MED ORDER — ONDANSETRON HCL 4 MG/2ML IJ SOLN
INTRAMUSCULAR | Status: AC
  Filled 2019-11-07: qty 2

## 2019-11-07 MED ORDER — TRAMADOL HCL 50 MG PO TABS: 50.0000 mg | ORAL_TABLET | Freq: Four times a day (QID) | ORAL | 0 refills | Status: DC | PRN

## 2019-11-07 MED ORDER — SODIUM CHLORIDE 0.9 % IR SOLN
Status: DC | PRN
  Administered 2019-11-07: 6000 mL via INTRAVESICAL

## 2019-11-07 MED ORDER — FENTANYL CITRATE (PF) 100 MCG/2ML IJ SOLN: 25.0000 ug | INTRAMUSCULAR | Status: DC | PRN

## 2019-11-07 MED ORDER — FENTANYL CITRATE (PF) 100 MCG/2ML IJ SOLN
INTRAMUSCULAR | Status: DC | PRN
  Administered 2019-11-07: 50 ug via INTRAVENOUS
  Administered 2019-11-07 (×2): 25 ug via INTRAVENOUS

## 2019-11-07 MED ORDER — EPHEDRINE SULFATE-NACL 50-0.9 MG/10ML-% IV SOSY
PREFILLED_SYRINGE | INTRAVENOUS | Status: DC | PRN
  Administered 2019-11-07 (×3): 5 mg via INTRAVENOUS

## 2019-11-07 MED ORDER — ACETAMINOPHEN 325 MG PO TABS: 325.0000 mg | ORAL_TABLET | Freq: Once | ORAL | Status: DC | PRN

## 2019-11-07 MED ORDER — PROPOFOL 10 MG/ML IV BOLUS
INTRAVENOUS | Status: DC | PRN
  Administered 2019-11-07: 150 mg via INTRAVENOUS
  Administered 2019-11-07: 30 mg via INTRAVENOUS

## 2019-11-07 MED ORDER — PHENYLEPHRINE HCL (PRESSORS) 10 MG/ML IV SOLN
INTRAVENOUS | Status: AC
  Filled 2019-11-07: qty 1

## 2019-11-07 MED ORDER — LIDOCAINE 2% (20 MG/ML) 5 ML SYRINGE
INTRAMUSCULAR | Status: AC
  Filled 2019-11-07: qty 5

## 2019-11-07 MED ORDER — ONDANSETRON HCL 4 MG/2ML IJ SOLN
INTRAMUSCULAR | Status: DC | PRN
  Administered 2019-11-07: 4 mg via INTRAVENOUS

## 2019-11-07 MED ORDER — SODIUM CHLORIDE 0.9 % IV SOLN
2.0000 g | Freq: Once | INTRAVENOUS | Status: AC
  Administered 2019-11-07: 2 g via INTRAVENOUS
  Filled 2019-11-07: qty 20

## 2019-11-07 MED ORDER — PROMETHAZINE HCL 25 MG/ML IJ SOLN: 6.2500 mg | INTRAMUSCULAR | Status: DC | PRN

## 2019-11-07 MED ORDER — PHENYLEPHRINE HCL-NACL 10-0.9 MG/250ML-% IV SOLN
INTRAVENOUS | Status: DC | PRN
  Administered 2019-11-07: 35 ug/min via INTRAVENOUS

## 2019-11-07 MED ORDER — LACTATED RINGERS IV SOLN: INTRAVENOUS | Status: DC

## 2019-11-07 MED ORDER — PROPOFOL 10 MG/ML IV BOLUS
INTRAVENOUS | Status: AC
  Filled 2019-11-07: qty 20

## 2019-11-07 MED ORDER — ACETAMINOPHEN 10 MG/ML IV SOLN: 1000.0000 mg | Freq: Once | INTRAVENOUS | Status: DC | PRN

## 2019-11-07 MED ORDER — LIDOCAINE 2% (20 MG/ML) 5 ML SYRINGE
INTRAMUSCULAR | Status: DC | PRN
  Administered 2019-11-07: 60 mg via INTRAVENOUS

## 2019-11-07 MED ORDER — CHLORHEXIDINE GLUCONATE 0.12 % MT SOLN
15.0000 mL | Freq: Once | OROMUCOSAL | Status: AC
  Administered 2019-11-07: 15 mL via OROMUCOSAL

## 2019-11-07 MED ORDER — SULFAMETHOXAZOLE-TRIMETHOPRIM 800-160 MG PO TABS
1.0000 | ORAL_TABLET | Freq: Two times a day (BID) | ORAL | 0 refills | Status: DC
Start: 2019-11-07 — End: 2020-08-07

## 2019-11-07 MED ORDER — ORAL CARE MOUTH RINSE: 15.0000 mL | Freq: Once | OROMUCOSAL | Status: AC

## 2019-11-07 MED ORDER — IOHEXOL 300 MG/ML  SOLN
INTRAMUSCULAR | Status: DC | PRN
  Administered 2019-11-07: 10 mL

## 2019-11-07 SURGICAL SUPPLY — 23 items
BAG URO CATCHER STRL LF (MISCELLANEOUS) ×2 IMPLANT
BASKET ZERO TIP NITINOL 2.4FR (BASKET) ×2 IMPLANT
CATH INTERMIT  6FR 70CM (CATHETERS) IMPLANT
CATH URET DUAL LUMEN 6-10FR 50 (CATHETERS) ×2 IMPLANT
CLOTH BEACON ORANGE TIMEOUT ST (SAFETY) ×2 IMPLANT
DRSG PAD ABDOMINAL 8X10 ST (GAUZE/BANDAGES/DRESSINGS) ×2 IMPLANT
DRSG TEGADERM 8X12 (GAUZE/BANDAGES/DRESSINGS) ×2 IMPLANT
FIBER LASER FLEXIVA 365 (UROLOGICAL SUPPLIES) IMPLANT
FIBER LASER TRAC TIP (UROLOGICAL SUPPLIES) ×2 IMPLANT
GAUZE SPONGE 4X4 12PLY STRL (GAUZE/BANDAGES/DRESSINGS) ×2 IMPLANT
GLOVE BIOGEL M STRL SZ7.5 (GLOVE) ×2 IMPLANT
GOWN STRL REUS W/TWL LRG LVL3 (GOWN DISPOSABLE) ×4 IMPLANT
GUIDEWIRE ANG ZIPWIRE 038X150 (WIRE) IMPLANT
GUIDEWIRE STR DUAL SENSOR (WIRE) ×4 IMPLANT
IV NS 1000ML (IV SOLUTION) ×2
IV NS 1000ML BAXH (IV SOLUTION) ×1 IMPLANT
KIT TURNOVER KIT A (KITS) IMPLANT
MANIFOLD NEPTUNE II (INSTRUMENTS) ×2 IMPLANT
PACK CYSTO (CUSTOM PROCEDURE TRAY) ×2 IMPLANT
SHEATH URETERAL 12FRX35CM (MISCELLANEOUS) ×2 IMPLANT
STENT URET 6FRX26 CONTOUR (STENTS) ×2 IMPLANT
TUBING CONNECTING 10 (TUBING) ×2 IMPLANT
TUBING UROLOGY SET (TUBING) ×2 IMPLANT

## 2019-11-07 NOTE — Progress Notes (Signed)
Pt discharged in NAD, VSS, no pain. Pt voiding with no problem. Discharge instructions given to wife. Pt and wife understood. Wife at bedside. Pt discharged home with daughter and wife.

## 2019-11-07 NOTE — Op Note (Signed)
Preoperative diagnosis: Left ureteral calculus  Postoperative diagnosis: Left ureteral calculus  Procedures: 1.  Cystoscopy 2.  Left retrograde pyelography with interpretation 3.  Left ureteroscopic laser lithotripsy with stone removal 4.  Removal of left percutaneous nephrostomy 5.  Left ureteral stent placement (6 x 26 -no string)  Surgeon: [name], [name] Bonito MD  Anesthesia: General  Complications: None  EBL: Minimal  Intraoperative findings: Left retrograde pyelography was performed via a dual-lumen catheter.  This revealed evidence of a transition point but no clear filling defect to suggest the known calculus that had been previously located in the proximal left ureter.  Specimen: Left ureteral calculi  Disposition of specimen: [facility]  Indication: [name] is a 74 year old gentleman with a complicated urologic history.  He has been noted to have an 8 to 9 mm proximal left ureteral calculus.  Previous attempts to treat the stone in a retrograde and antegrade fashion were unsuccessful due to his very large prostate and large body habitus.  Recently, he presented with acute infection consistent with sepsis related to his ureteral obstruction and stone.  He underwent left nephrostomy tube placement.  In addition, he had a myocardial infarction during that hospitalization requiring intervention and cardiac stent placement.  He was then placed on dual antiplatelet therapy and eventually was able to have an antegrade left ureteral stent placed by interventional radiology.  We then waited an adequate time before he could safely discontinue dual antiplatelet therapy.  He presents today for definitive treatment of his stone with hope that this can be performed via a retrograde approach although we are prepared to proceed antegrade if necessary.  The potential risks, complications, and the anticipated recovery process was discussed in detail with the patient.  He gives  informed consent.  Description of procedure: The patient was taken to the operating room and a general anesthetic was administered.  He was given preoperative antibiotics, placed in the dorsolithotomy position, and prepped and draped in the usual sterile fashion.  A preoperative timeout was performed.  Cystourethroscopy was performed and the indwelling ureteral stent was identified.  This was carefully removed with a flexible grasper to the urethral meatus with care to monitor the proximal end of the stent under direct fluoroscopy.  A 0.38 sensor guidewire was then advanced up the left ureter into the left renal collecting system.  The stent was removed.  A dual-lumen catheter was then placed over this wire and Omnipaque contrast was injected with findings as dictated above.  The stone could not be clearly identified.  A second 0.38 sensor guidewire was then advanced through the dual-lumen catheter.  At this point, it was decided to perform semirigid ureteroscopy.  This was performed with a 6 French ureteroscope.  This was able to be advanced up into the proximal ureter where an impacted calculus was identified.  A 200 m fiber was then used to perform laser lithotripsy with a holmium laser.  All fragments were then removed with a 0 tip nitinol basket.  Once it was felt the patient was stone free, reinspection of the semirigid ureteroscope revealed no further stone fragments.  Considering the difficulty in accessing the patient's urinary tract, it was decided to perform flexible ureteroscopy to ensure no fragments had migrated into the proximal ureter or renal collecting system.  A 12/14 ureteral access sheath was placed over one of the wires leaving a safety wire in place.  This was placed up to the level that had been examined carefully with  direct vision.  Flexible ureteroscopy was then performed with no stones noted in the proximal ureter or anywhere within the renal collecting system.  The patient's  nephrostomy tube was then identified and was removed under direct vision.  The ureteroscope was then carefully withdrawn down the entire ureter and no remaining calculi were identified.  The remaining wire was then backloaded on the cystoscope and a 6 x 26 double-J ureteral stent was advanced over the wire using Seldinger technique and positioned appropriately under fluoroscopic and cystoscopic guidance.  The wire was removed and a good curl was noted within the renal pelvis as well as within the bladder.  Reinspection of the bladder revealed 1 small calculus that was removed.  The bladder was emptied and the procedure was ended.  He tolerated the procedure well without complications.  He was able to be extubated and transferred to the recovery unit in satisfactory condition.

## 2019-11-07 NOTE — Discharge Instructions (Signed)
1. You may see some blood in the urine and may have some burning with urination for 48-72 hours. You also may notice that you have to urinate more frequently or urgently after your procedure which is normal.  2. You should call should you develop an inability urinate, fever > 101, persistent nausea and vomiting that prevents you from eating or drinking to stay hydrated.  If you have a stent, you will likely urinate more frequently and urgently until the stent is removed and you may experience some discomfort/pain in the lower abdomen and flank especially when urinating. You may take pain medication prescribed to you if needed for pain. You may also intermittently have blood in the urine until the stent is removed.  ** YOU MAY RESTART BRILLINTA IN 4 HRS IF YOUR URINE IS CLEAR.  IF NOT, WAIT UNTIL IT CLEARS.  IF STILL NOT CLEAR BY Monday, PLEASE CALL THE OFFICE FOR INSTRUCTIONS.  YOU MAY CONTINUE ASPIRIN 81 MG DAILY.

## 2019-11-07 NOTE — Anesthesia Postprocedure Evaluation (Signed)
Anesthesia Post Note  Patient: [name]  Procedure(s) Performed: CYSTOSCOPY/URETEROSCOPY/HOLMIUM LASER/STENT PLACEMENT (Left )     Patient location during evaluation: PACU Anesthesia Type: General Level of consciousness: awake and alert Pain management: pain level controlled Vital Signs Assessment: post-procedure vital signs reviewed and stable Respiratory status: spontaneous breathing, nonlabored ventilation, respiratory function stable and patient connected to nasal cannula oxygen Cardiovascular status: blood pressure returned to baseline and stable Postop Assessment: no apparent nausea or vomiting Anesthetic complications: no   No complications documented.  Last Vitals:  Vitals:   11/07/19 1015 11/07/19 1030  BP: (!) 96/55 (!) 99/57  Pulse: 63 65  Resp: 17 15  Temp:    SpO2: 99% 100%    Last Pain:  Vitals:   11/07/19 1030  TempSrc:   PainSc: 0-No pain                 [name]

## 2019-11-07 NOTE — Transfer of Care (Signed)
Immediate Anesthesia Transfer of Care Note  Patient: [name]  Procedure(s) Performed: CYSTOSCOPY/URETEROSCOPY/HOLMIUM LASER/STENT PLACEMENT (Left )  Patient Location: PACU  Anesthesia Type:General  Level of Consciousness: awake, alert  and oriented  Airway & Oxygen Therapy: Patient Spontanous Breathing and Patient connected to face mask oxygen  Post-op Assessment: Report given to RN and Post -op Vital signs reviewed and stable  Post vital signs: Reviewed and stable  Last Vitals:  Vitals Value Taken Time  BP 93/59 11/07/19 0845  Temp    Pulse 72 11/07/19 0850  Resp 20 11/07/19 0850  SpO2 100 % 11/07/19 0850  Vitals shown include unvalidated device data.  Last Pain:  Vitals:   11/07/19 0620  TempSrc: Oral  PainSc:       Patients Stated Pain Goal: 4 (58/00/63 [id number])  Complications: No complications documented.

## 2019-11-07 NOTE — Interval H&P Note (Signed)
History and Physical Interval Note:  11/07/2019 6:59 AM  [name]  has presented today for surgery, with the diagnosis of LEFT URETERAL STONE.  The various methods of treatment have been discussed with the patient and family. After consideration of risks, benefits and other options for treatment, the patient has consented to  Procedure(s): CYSTOSCOPY/URETEROSCOPY/HOLMIUM LASER/STENT PLACEMENT/ POSSIBLE PERCUTANEOUS NEPHROLITHOTOMY (Left) as a surgical intervention.  The patient's history has been reviewed, patient examined, no change in status, stable for surgery.  I have reviewed the patient's chart and labs.  Questions were answered to the patient's satisfaction.     [employer]

## 2019-11-07 NOTE — Anesthesia Procedure Notes (Signed)
Procedure Name: Intubation Date/Time: 11/07/2019 7:39 AM Performed by: Maxwell Caul, CRNA Pre-anesthesia Checklist: Patient identified, Emergency Drugs available, Suction available and Patient being monitored Patient Re-evaluated:Patient Re-evaluated prior to induction Oxygen Delivery Method: Circle system utilized Preoxygenation: Pre-oxygenation with 100% oxygen Induction Type: IV induction Ventilation: Mask ventilation without difficulty Laryngoscope Size: Mac and 4 Grade View: Grade I Tube type: Oral Tube size: 7.5 mm Number of attempts: 1 Airway Equipment and Method: Stylet Placement Confirmation: ETT inserted through vocal cords under direct vision,  positive ETCO2 and breath sounds checked- equal and bilateral Secured at: 22 cm Tube secured with: Tape Dental Injury: Teeth and Oropharynx as per pre-operative assessment

## 2019-11-07 NOTE — H&P (Signed)
Office Visit Report     10/09/2019   --------------------------------------------------------------------------------   [name]. [name]  MRN: [id number]  DOB: 26-Dec-1945, 74 year old Male  SSN: [id number]   PRIMARY CARE:  Chauncey Reading, MD  REFERRING:  Chauncey Reading, MD  PROVIDER:  Raynelle Bring, M.D.  LOCATION:  [facility] [phone]     --------------------------------------------------------------------------------   CC/HPI: 1. Left ureteral calculus  2. BPH/LUTS  3. Elevated PSA   [name] returns today for further evaluation of his left ureteral calculus. He has now been on dual anti-platelet therapy long enough that if he needs to temporarily stop his Brilinta, he has been approved to do this per Cardiology. He will need to continue aspirin 81 mg perioperatively with any procedure. Since his last visit, he has been evaluated by Dr. Pascal Lux in interventional radiology who was able to place an antegrade stent. His left nephrostomy tube is now capped. He did recently present with concern of infection just completed a course of antibiotics for possible pyelonephritis. His urine culture was positive for pansensitive E coli he was treated with Bactrim. He is now clinically improved. He denies infection and only has minor discomfort associated with his left nephrostomy tube. He has not noted any recent hematuria. He denies any recent chest pain, shortness of breath, or palpitations.     ALLERGIES: Codeine Derivatives - headaches    MEDICATIONS: Aspirin 81 mg tablet,chewable  Metoprolol Succinate  Vesicare 5 mg tablet 1 tablet PO Daily  Brilinta  Nitroglycerin  Rosuvastatin Calcium     GU PSH: Cystoscopy - 12/14/2018 Cystoscopy Retrogrades - 02/04/2019 Locm 300-399Mg /Ml Iodine,1Ml - 05/14/2019, 03/22/2019, 12/14/2018       PSH Notes: Partial Colectomy, Leg Repair  lithotripsy  tonsillectomy   NON-GU PSH: Partial colectomy -  2017     GU PMH: Family history of malignant neoplasm of other respiratory and intrathoracic organs - 03/06/2019 Hydronephrosis - 03/06/2019 Renal calculus - 02/12/2019 Urge incontinence - 02/12/2019 Microscopic hematuria - 12/14/2018 Ureteral calculus - 12/14/2018 History of urolithiasis - 11/22/2017 BPH w/LUTS - 2017 Urinary Frequency - 2017 Elevated PSA, Elevated prostate specific antigen (PSA) - 2017      PMH Notes:   1) Elevated PSA: He presented to me with a PSA in March 2017. His PSA was 5.66 and he underwent a TRUS biopsy of the prostate that was benign. He does have a family history of prostate with his father having been diagnosed at age 20 and undergone treatment with a radical prostatectomy.   Apr 2017: 12 core biopsy - benign, Vol 183.7 cc   2) BPH/LUTS: His baseline IPSS was 8 in March 2017. His prostate volume has been measured at over 180 cc. His symptoms progressed in 2020 (primarily urge incontinence) and he tried alpha blocker therapy with tamsulosin but had dizziness and so stopped. His symptoms improved even after stopping therapy.   Current treatment: Observation  Prior treatment: Tamsulosin (stopped due to dizziness)   3) Urolithiasis: He does have a history of urolithiasis. In 2019, he did spontaneously pass a stone that was confirmed to be calcium oxalate. We attempted treatment of an asymptomatic, 1 cm left renal pelvic stone but could not access his stone either in a retrograde nor antegrade fashion. His stone was not radioopaque negating the option of ESWL.   Oct 2020: Attempted ureteroscopic treatment of 1 cm left renal pelvic stone (could not access left ureter in a retrograde fashion due to BPH), could  not gain percutaneous access due to body habitus and lack of hydronephrosis  Nov 2020: Renal ultrasound - left hydronephrosis  Apr 2021: Left ureteral stone and sepsis - left nephrostomy tube  May 2021: Left antegrade ureteral stent placed by Dr. Pascal Lux, PCN  left in place capped      NON-GU PMH: Bacteriuria - 05/17/2019 Hyperglycemia, unspecified Hypertension Myocardial Infarction Obesity Other idiopathic peripheral autonomic neuropathy    FAMILY HISTORY: Prostate Cancer - Father   SOCIAL HISTORY: Marital Status: Married Preferred Language: English; Ethnicity: Not Hispanic Or Latino; Race: White Current Smoking Status: Patient does not smoke anymore. Has not smoked since 01/30/1994.  Does not use smokeless tobacco. Has never drank.  Does not use drugs. Does not drink caffeine. Has not had a blood transfusion.     Notes: Former smoker   REVIEW OF SYSTEMS:    GU Review Male:   Patient reports frequent urination and get up at night to urinate. Patient denies hard to postpone urination, burning/ pain with urination, leakage of urine, stream starts and stops, trouble starting your streams, and have to strain to urinate .  Gastrointestinal (Lower):   Patient denies diarrhea and constipation.  Gastrointestinal (Upper):   Patient denies nausea and vomiting.  Constitutional:   Patient reports fatigue. Patient denies fever, night sweats, and weight loss.  Skin:   Patient denies skin rash/ lesion and itching.  Eyes:   Patient denies blurred vision and double vision.  Ears/ Nose/ Throat:   Patient denies sore throat and sinus problems.  Hematologic/Lymphatic:   Patient denies swollen glands and easy bruising.  Cardiovascular:   Patient denies leg swelling and chest pains.  Respiratory:   Patient denies cough and shortness of breath.  Endocrine:   Patient denies excessive thirst.  Musculoskeletal:   Patient reports back pain. Patient denies joint pain.  Neurological:   Patient reports dizziness. Patient denies headaches.  Psychologic:   Patient denies depression and anxiety.   VITAL SIGNS:      10/09/2019 12:55 PM  Weight 249 lb / 112.94 kg  Height 68 in / 172.72 cm  BP 94/55 mmHg  Pulse 57 /min  Temperature 97.5 F / 36.3 C  BMI 37.9  kg/m   MULTI-SYSTEM PHYSICAL EXAMINATION:    Constitutional: Well-nourished. No physical deformities. Normally developed. Good grooming.  Respiratory: No labored breathing, no use of accessory muscles. Clear bilaterally.  Cardiovascular: Normal temperature, normal extremity pulses, no swelling, no varicosities. Regular rate and rhythm.     Complexity of Data:  Records Review:   Previous Patient Records   11/26/18 11/13/17 08/09/16 02/03/16 07/25/15  PSA  Total PSA 4.54 ng/mL 6.19 ng/mL 5.12 ng/dl 7.87  5.66   Free PSA 2.07 ng/mL 1.58 ng/mL 1.36 ng/dl 1.66  1.65   % Free PSA 46 % PSA 26 % PSA 27 % 21  29     PROCEDURES:          Urinalysis w/Scope Dipstick Dipstick Cont'd Micro  Color: Yellow Bilirubin: Neg mg/dL WBC/hpf: 20 - 40/hpf  Appearance: Cloudy Ketones: Neg mg/dL RBC/hpf: >60/hpf  Specific Gravity: 1.020 Blood: 3+ ery/uL Bacteria: Few (10-25/hpf)  pH: 6.0 Protein: 2+ mg/dL Cystals: Amorph Phosphates  Glucose: Neg mg/dL Urobilinogen: 0.2 mg/dL Casts: Hyaline    Nitrites: Neg Trichomonas: Not Present    Leukocyte Esterase: 2+ leu/uL Mucous: Not Present      Epithelial Cells: 0 - 5/hpf      Yeast: NS (Not Seen)      Sperm: Not Present  ASSESSMENT:      ICD-10 Details  1 GU:   Ureteral calculus - N20.1    PLAN:           Orders Labs Urine Culture          Schedule Return Visit/Planned Activity: Other See Visit Notes             Note: Will call to schedule surgery.          Document Letter(s):  Created for Patient: Clinical Summary         Notes:   1. Left ureteral calculus: Now that he does have a ureteral stent in place, there is a significant chance of success with a retrograde ureteroscopic approach. As such, we have agreed to proceed with a planned cystoscopy with left ureteroscopy, laser lithotripsy, and stone removal with replacement of his left ureteral stent if possible. However, understanding that he has an extremely large prostate an access to his  ureter in a retrograde fashion as previously been unsuccessful, he understands that there is a possibility we would need to proceed in an antegrade fashion. Dr. Pascal Lux did confirm that he appears to have good nephrostomy tube access and we could consider dilation of this access an antegrade treatment of his stone if retrograde treatment is unsuccessful. We reviewed both of these options in detail today including the potential risks, complications, and the expected recovery process. He has been approved by cardiology to discontinue his Brilinta perioperatively at this point as he is now approximately 2 months out from stent placement. He will need to continue aspirin 81 mg perioperatively. His urine will be cultured today.   2. BPH/LUTS: Continue observation. His PVR has been negligible despite his very large prostate.   3. Elevated PSA: This will be re-evaluated following recovery from his upcoming procedure.   Cc: Dr. Veronia Beets         Next Appointment:      Next Appointment: 11/27/2019 08:15 AM    Appointment Type: Office Visit Established Patient    Location: [facility] [phone]    Provider: Raynelle Bring, M.D.    Reason for Visit: 1 year ofc visit / PVR / [name]      * Signed by Raynelle Bring, M.D. on 10/09/19 at 7:43 PM (EDT)*

## 2019-11-08 ENCOUNTER — Encounter (HOSPITAL_COMMUNITY): Payer: Self-pay | Admitting: Urology

## 2019-12-31 ENCOUNTER — Ambulatory Visit (HOSPITAL_BASED_OUTPATIENT_CLINIC_OR_DEPARTMENT_OTHER): Payer: Medicare Other | Attending: Cardiology | Admitting: Cardiology

## 2019-12-31 ENCOUNTER — Other Ambulatory Visit: Payer: Self-pay

## 2019-12-31 VITALS — Ht 68.0 in | Wt 240.0 lb

## 2019-12-31 DIAGNOSIS — G4733 Obstructive sleep apnea (adult) (pediatric): Secondary | ICD-10-CM | POA: Diagnosis not present

## 2019-12-31 DIAGNOSIS — I10 Essential (primary) hypertension: Secondary | ICD-10-CM | POA: Diagnosis not present

## 2019-12-31 DIAGNOSIS — E7800 Pure hypercholesterolemia, unspecified: Secondary | ICD-10-CM | POA: Diagnosis not present

## 2019-12-31 DIAGNOSIS — I272 Pulmonary hypertension, unspecified: Secondary | ICD-10-CM | POA: Diagnosis not present

## 2019-12-31 DIAGNOSIS — I255 Ischemic cardiomyopathy: Secondary | ICD-10-CM | POA: Diagnosis not present

## 2019-12-31 DIAGNOSIS — I2583 Coronary atherosclerosis due to lipid rich plaque: Secondary | ICD-10-CM | POA: Insufficient documentation

## 2019-12-31 DIAGNOSIS — I2510 Atherosclerotic heart disease of native coronary artery without angina pectoris: Secondary | ICD-10-CM

## 2020-01-02 NOTE — Procedures (Signed)
   Patient Name: [name], [name] Date: 12/31/2019 Gender: Male D.O.B: 12/03/1945 Age (years): 1 Referring Provider: Fransico Him MD, ABSM Height (inches): 21 Interpreting Physician: Fransico Him MD, ABSM Weight (lbs): 240 RPSGT: Jacolyn Reedy BMI: 36 MRN: [id number] Neck Size: 16.00  CLINICAL INFORMATION Sleep Study Type: HST  Indication for sleep study: N/A  Epworth Sleepiness Score: 5  SLEEP STUDY TECHNIQUE A multi-channel overnight portable sleep study was performed. The channels recorded were: nasal airflow, thoracic respiratory movement, and oxygen saturation with a pulse oximetry. Snoring was also monitored.  MEDICATIONS Patient self administered medications include: N/A.  SLEEP ARCHITECTURE Patient was studied for 476.3 minutes. The sleep efficiency was 100.0 % and the patient was supine for 0%. The arousal index was 0.0 per hour.  RESPIRATORY PARAMETERS The overall AHI was 12.3 per hour, with a central apnea index of 0.0 per hour.  The oxygen nadir was 88% during sleep.  CARDIAC DATA Mean heart rate during sleep was 57.7 bpm.  IMPRESSIONS - Mild obstructive sleep apnea occurred during this study (AHI = 12.3/h). - No significant central sleep apnea occurred during this study (CAI = 0.0/h). - Mild oxygen desaturation was noted during this study (Min O2 = 88%). - Patient snored 1.1% during the sleep.  DIAGNOSIS - Obstructive Sleep Apnea (G47.33)  RECOMMENDATIONS - Recommend CPAP on auto from 4-15cm H2O with heated humidity and mask of choice. - Avoid alcohol, sedatives and other CNS depressants that may worsen sleep apnea and disrupt normal sleep architecture. - Sleep hygiene should be reviewed to assess factors that may improve sleep quality. - Weight management and regular exercise should be initiated or continued.  [Electronically signed] 01/02/2020 11:16 AM  Fransico Him MD, ABSM Diplomate, American Board of Sleep Medicine

## 2020-01-03 ENCOUNTER — Telehealth: Payer: Self-pay | Admitting: *Deleted

## 2020-01-03 NOTE — Telephone Encounter (Signed)
Informed patient of sleep study results and patient understanding was verbalized. Patient understands his sleep study showed they have sleep apnea and recommend auto CPAP titration through DME. Orders have been placed in Epic. Please set 8 week OV with me.   Pt is aware and agreeable to his results.  Upon patient request DME selection is [facility]. Patient understands he will be contacted by [facility] to set up his cpap. Patient understands to call if [facility] does not contact him with new setup in a timely manner. Patient understands they will be called once confirmation has been received from [facility] that they have received their new machine to schedule 10 week follow up appointment.  [facility] notified of new cpap order  Please add to airview Patient was grateful for the call and thanked me.

## 2020-01-03 NOTE — Telephone Encounter (Signed)
-----   Message from Sueanne Margarita, MD sent at 01/02/2020 11:20 AM EDT ----- Please let patient know that they have sleep apnea and recommend auto CPAP titration through DME.  Orders have been placed in Epic. Please set 8 week OV with me.

## 2020-03-05 ENCOUNTER — Other Ambulatory Visit: Payer: Self-pay

## 2020-03-05 ENCOUNTER — Ambulatory Visit (HOSPITAL_COMMUNITY): Payer: Medicare Other | Attending: Cardiology

## 2020-03-05 DIAGNOSIS — E7800 Pure hypercholesterolemia, unspecified: Secondary | ICD-10-CM | POA: Diagnosis present

## 2020-03-05 DIAGNOSIS — I272 Pulmonary hypertension, unspecified: Secondary | ICD-10-CM

## 2020-03-05 DIAGNOSIS — I255 Ischemic cardiomyopathy: Secondary | ICD-10-CM | POA: Diagnosis not present

## 2020-03-05 DIAGNOSIS — I10 Essential (primary) hypertension: Secondary | ICD-10-CM

## 2020-03-05 DIAGNOSIS — I2583 Coronary atherosclerosis due to lipid rich plaque: Secondary | ICD-10-CM | POA: Insufficient documentation

## 2020-03-05 DIAGNOSIS — I2510 Atherosclerotic heart disease of native coronary artery without angina pectoris: Secondary | ICD-10-CM | POA: Insufficient documentation

## 2020-03-05 LAB — ECHOCARDIOGRAM COMPLETE
Area-P 1/2: 2.91 cm2
S' Lateral: 2.5 cm

## 2020-03-06 ENCOUNTER — Telehealth: Payer: Self-pay | Admitting: Cardiology

## 2020-03-06 NOTE — Telephone Encounter (Signed)
Patient is returning call to discuss echo results. °

## 2020-03-06 NOTE — Telephone Encounter (Signed)
Spoke with pt and advised per Dr Radford Pax echo shows - Normal heart function with mildly thickened heart muscle, trivial leakiness of MV.  Pt reports he has been unable to take Crestor x 4 months due to muscle and joint pain.  Pt states he has felt the best he has in quite awhile.  Will forward information to Dr Radford Pax for further review and recommendation.  Pt verbalizes understanding and thanked Therapist, sports for call.

## 2020-03-10 ENCOUNTER — Telehealth: Payer: Self-pay | Admitting: Cardiology

## 2020-03-10 DIAGNOSIS — E7800 Pure hypercholesterolemia, unspecified: Secondary | ICD-10-CM

## 2020-03-10 DIAGNOSIS — I2510 Atherosclerotic heart disease of native coronary artery without angina pectoris: Secondary | ICD-10-CM

## 2020-03-10 DIAGNOSIS — Z79899 Other long term (current) drug therapy: Secondary | ICD-10-CM

## 2020-03-10 NOTE — Telephone Encounter (Signed)
Pt c/o medication issue:  1. Name of Medication: rosuvastatin (CRESTOR) 40 MG tablet  2. How are you currently taking this medication (dosage and times per day)? Pt has not taken since May  3. Are you having a reaction (difficulty breathing--STAT)? Not anymore  4. What is your medication issue? Pt was having difficulty moving to the point where he needed help getting out of his chair. Since he stopped taking the medication he feels much better. He was not sure if Dr. Radford Pax or Truitt Merle wanted to put him on another medication for his cholesterol or not. Please advise

## 2020-03-10 NOTE — Telephone Encounter (Signed)
Please refer to lipid clinic

## 2020-03-10 NOTE — Telephone Encounter (Signed)
Left message for the pt to call back.  Referral placed for the [facility]/ Lipid clinic.

## 2020-03-10 NOTE — Telephone Encounter (Signed)
Left message for patient to call back. See above phone note.

## 2020-03-11 NOTE — Progress Notes (Addendum)
Patient ID: [name]                 DOB: 1945/05/13                    MRN: [id number]     HPI: [name] is a 73 y.o. male patient referred to lipid clinic by Dr. Radford Pax. PMH is significant for left renal calculi,BPH - followed by Dr. [name] - migraines, CKD, and obesity. No known previous history of CAD noted prior to NSTEMI in April of 2021  He called yesterday to inform office he had severe pain with rosuvastatin 40 mg daily and barely able to walk. He was referred to lipid clinic.   He presents today to the lipid clinic in good spirits. He reports pain in his hips making it hard to move and he needed help to get out of the chair 2-3 days after starting the medication. He felt tired and weak and also had constipation. Patient has fissure and would like to avoid hard stools or constipation. States that rosuvastatin gave him constipation. He stopped rosuvastatin in April and felt better within one week, able to work in the yard again. He said other family members have had problems with statins and wants a good quality of life rather than quanitity.    Current Medications: none Intolerances: rosuvastatin 40 mg daily Risk Factors: advancing age, established CVD LDL goal: <70 mg/dL  Diet:  Breakfast: Honey nut cheerios if he eats breakfast at all Lunch: country ham, eggs, hamburger once in a while, ham sandwich but not often, vegetable beef soup when it is cold (makes some and will also used canned sometimes) Dinner: baked potato with green peppers and onions, steak once per month, salad, chicken Snacks: Tomatoes, peaches and frozen strawberries in the summer, potato chips once in a while. Likes grapes and apples Drinks: lemonade zero calorie, coke zero, water  Exercise: mows grass or leaves, gardening, walking about 10 minutes every day. Limited by bad leg   Family History: Not on file  Social History: Former smoker 40 pack-year history (24 years since quit date), does  not drink alcohol, has never used drugs  Labs: 08/09/19 TC 135 TG 234 HDL 15 LDL 73   Past Medical History:  Diagnosis Date  . CHF (congestive heart failure) (HCC)    EF 45-50% on 08/2019 echo  . Chronic kidney disease    RENAL STONES  . Coronary artery disease    3 vessel disease: DES to RCA, DES to LCx 08/2019 and medical management  . High frequency hearing loss   . History of migraine headaches   . Hx of colonic polyps   . Hyperlipidemia   . Hypertension   . Obesity   . Pulmonary hypertension ([place])   . Seborrheic dermatitis   . Vitiligo     Current Outpatient Medications on File Prior to Visit  Medication Sig Dispense Refill  . aspirin EC 81 MG EC tablet Take 1 tablet (81 mg total) by mouth daily. 30 tablet 11  . lisinopril (ZESTRIL) 5 MG tablet Take 1 tablet (5 mg total) by mouth daily. 90 tablet 3  . metoprolol succinate (TOPROL-XL) 50 MG 24 hr tablet Take 1 tablet (50 mg total) by mouth daily. Take with or immediately following a meal. 90 tablet 3  . nitroGLYCERIN (NITROSTAT) 0.4 MG SL tablet Place 1 tablet (0.4 mg total) under the tongue every 5 (five) minutes as needed for chest pain. 25 tablet 3  .  rosuvastatin (CRESTOR) 40 MG tablet Take 1 tablet (40 mg total) by mouth daily at 6 PM. 90 tablet 3  . sulfamethoxazole-trimethoprim (BACTRIM DS) 800-160 MG tablet Take 1 tablet by mouth 2 (two) times daily. 6 tablet 0  . traMADol (ULTRAM) 50 MG tablet Take 1-2 tablets (50-100 mg total) by mouth every 6 (six) hours as needed (pain). 15 tablet 0   No current facility-administered medications on file prior to visit.    Allergies  Allergen Reactions  . Codeine     Bad Headache    Assessment/Plan:  1. Hyperlipidemia - Patient presents today with LDL slightly above goal of <70 mg/dL. Discussed the benefits of statins in preventing secondary ASCVD events. We discussed that not all the statins are the same and he may tolerate either a lower dose of rosuvastatin or a different  statin. Considering history of NSTEMI and history of myalgia with rosuvastatin, a moderate intensity and hydrophillic statin was selected. Will initiate pravastatin 40 mg daily.  Patient knows to call us if he has problems with pravastatin. Recommended lifestyle modifications including eating more fruits and vegetables and limiting meats higher in fat such as beef. Discussed drinking more water in addition to eating more fiber to prevent hard stools and constipation.  Will recheck lipids in 3 months. Apt scheduled for 05/07/20.  Thank you, Benna Dunks  PharmD Candidate, 2022  Ramond Dial, Pharm.D, BCPS, CPP [facility]  [address], [place], [place] [address]  Phone: [phone]; Fax: [phone]

## 2020-03-12 ENCOUNTER — Other Ambulatory Visit: Payer: Self-pay

## 2020-03-12 ENCOUNTER — Ambulatory Visit (INDEPENDENT_AMBULATORY_CARE_PROVIDER_SITE_OTHER): Payer: Medicare Other | Admitting: Pharmacist

## 2020-03-12 DIAGNOSIS — M791 Myalgia, unspecified site: Secondary | ICD-10-CM

## 2020-03-12 DIAGNOSIS — I2583 Coronary atherosclerosis due to lipid rich plaque: Secondary | ICD-10-CM

## 2020-03-12 DIAGNOSIS — I2510 Atherosclerotic heart disease of native coronary artery without angina pectoris: Secondary | ICD-10-CM | POA: Diagnosis not present

## 2020-03-12 DIAGNOSIS — T466X5A Adverse effect of antihyperlipidemic and antiarteriosclerotic drugs, initial encounter: Secondary | ICD-10-CM

## 2020-03-12 DIAGNOSIS — G720 Drug-induced myopathy: Secondary | ICD-10-CM

## 2020-03-12 DIAGNOSIS — E7800 Pure hypercholesterolemia, unspecified: Secondary | ICD-10-CM | POA: Diagnosis not present

## 2020-03-12 MED ORDER — PRAVASTATIN SODIUM 40 MG PO TABS
40.0000 mg | ORAL_TABLET | Freq: Every evening | ORAL | 3 refills | Status: DC
Start: 2020-03-12 — End: 2020-05-08

## 2020-03-12 NOTE — Patient Instructions (Addendum)
It was a pleasure seeing you today!   Start taking pravastatin 40 mg daily. It is best to take this in the evening with your Brilinta. Call us if you have any problems with this medication.   Try to eat a lot of vegetables like cabbage or vegetables with a lot of fiber. Try to eat fruit daily. Fruit and vegetables are good for our heart and can help to prevent constipation. Drinking a lot of water is also helpful in preventing constipation.   Try to limit meats high in fat like hamburgers. Keep limiting steaks to one time per month.

## 2020-04-14 ENCOUNTER — Other Ambulatory Visit: Payer: Self-pay | Admitting: Nurse Practitioner

## 2020-04-16 ENCOUNTER — Telehealth: Payer: Self-pay | Admitting: Pharmacist

## 2020-04-16 DIAGNOSIS — E7800 Pure hypercholesterolemia, unspecified: Secondary | ICD-10-CM

## 2020-04-16 DIAGNOSIS — Z955 Presence of coronary angioplasty implant and graft: Secondary | ICD-10-CM

## 2020-04-16 MED ORDER — TICAGRELOR 90 MG PO TABS: 90.0000 mg | ORAL_TABLET | Freq: Two times a day (BID) | ORAL | 4 refills | Status: DC

## 2020-04-16 MED ORDER — EZETIMIBE 10 MG PO TABS: 10.0000 mg | ORAL_TABLET | Freq: Every day | ORAL | 2 refills | Status: DC

## 2020-04-16 NOTE — Telephone Encounter (Signed)
Patient called.  Reports severe muscle pains with pravastatin and has since discontinued it.  Would like to try Zetia and will schedule appt in lipid clinic.  Also was confused whether he needed to stay on Brilinta since he ran out of refills. Is no longer on patients med list.  Per Dr Marisue Humble notes from 4/21, patient should be on DAPT for 12 months.  Will send refill of Brilinta and start Zetia once daily.  Patient voiced understanding.

## 2020-05-07 ENCOUNTER — Other Ambulatory Visit: Payer: Medicare Other

## 2020-05-07 ENCOUNTER — Other Ambulatory Visit: Payer: Self-pay

## 2020-05-07 DIAGNOSIS — E7800 Pure hypercholesterolemia, unspecified: Secondary | ICD-10-CM

## 2020-05-07 LAB — LIPID PANEL
Chol/HDL Ratio: 5.6 ratio — ABNORMAL HIGH (ref 0.0–5.0)
Cholesterol, Total: 175 mg/dL (ref 100–199)
HDL: 31 mg/dL — ABNORMAL LOW (ref >39)
LDL Chol Calc (NIH): 116 mg/dL — ABNORMAL HIGH (ref 0–99)
Triglycerides: 159 mg/dL — ABNORMAL HIGH (ref 0–149)
VLDL Cholesterol Cal: 28 mg/dL (ref 5–40)

## 2020-05-08 ENCOUNTER — Telehealth: Payer: Self-pay | Admitting: Pharmacist

## 2020-05-08 MED ORDER — ROSUVASTATIN CALCIUM 5 MG PO TABS
5.0000 mg | ORAL_TABLET | ORAL | 3 refills | Status: DC
Start: 2020-05-11 — End: 2021-05-04

## 2020-05-08 NOTE — Telephone Encounter (Signed)
Spoke to patient about his cholesterol labs. He is not taking pravastatin due to muscle aches. Tolerating zetia fine. Discussed trying rosuvastatin 5mg  two times a week. Patient agreeable to trying. Continue zetia 10mg  daily. Will recheck lipids in 3 months.

## 2020-08-03 ENCOUNTER — Telehealth: Payer: Self-pay

## 2020-08-03 DIAGNOSIS — I2510 Atherosclerotic heart disease of native coronary artery without angina pectoris: Secondary | ICD-10-CM

## 2020-08-03 DIAGNOSIS — I2583 Coronary atherosclerosis due to lipid rich plaque: Secondary | ICD-10-CM

## 2020-08-03 NOTE — Telephone Encounter (Signed)
-----   Message from Ramond Dial, [place] sent at 07/31/2020 10:57 AM EDT -----  ----- Message ----- From: Ramond Dial, RPH-CPP Sent: 07/31/2020  12:00 AM EDT To: Ramond Dial, RPH-CPP  Set up lipids

## 2020-08-03 NOTE — Telephone Encounter (Signed)
lmom for lipids and labs ordered

## 2020-08-07 ENCOUNTER — Encounter: Payer: Self-pay | Admitting: Cardiology

## 2020-08-07 ENCOUNTER — Other Ambulatory Visit: Payer: Self-pay

## 2020-08-07 ENCOUNTER — Ambulatory Visit: Payer: Medicare Other | Admitting: Cardiology

## 2020-08-07 ENCOUNTER — Telehealth: Payer: Self-pay | Admitting: *Deleted

## 2020-08-07 VITALS — BP 130/80 | HR 79 | Ht 68.0 in | Wt 263.8 lb

## 2020-08-07 DIAGNOSIS — I272 Pulmonary hypertension, unspecified: Secondary | ICD-10-CM

## 2020-08-07 DIAGNOSIS — G4733 Obstructive sleep apnea (adult) (pediatric): Secondary | ICD-10-CM | POA: Diagnosis not present

## 2020-08-07 DIAGNOSIS — E7800 Pure hypercholesterolemia, unspecified: Secondary | ICD-10-CM

## 2020-08-07 DIAGNOSIS — I2583 Coronary atherosclerosis due to lipid rich plaque: Secondary | ICD-10-CM

## 2020-08-07 DIAGNOSIS — I2510 Atherosclerotic heart disease of native coronary artery without angina pectoris: Secondary | ICD-10-CM

## 2020-08-07 DIAGNOSIS — I10 Essential (primary) hypertension: Secondary | ICD-10-CM

## 2020-08-07 DIAGNOSIS — I255 Ischemic cardiomyopathy: Secondary | ICD-10-CM

## 2020-08-07 MED ORDER — TICAGRELOR 60 MG PO TABS: 60.0000 mg | ORAL_TABLET | Freq: Two times a day (BID) | ORAL | 3 refills | Status: DC

## 2020-08-07 NOTE — Telephone Encounter (Signed)
Re-entered from 01/03/20.  Upon patient request DME selection is [facility]. Patient understands he will be contacted by [facility] to set up his cpap. Patient understands to call if [facility] does not contact him with new setup in a timely manner. Patient understands they will be called once confirmation has been received from [facility] that they have received their new machine to schedule 10 week follow up appointment.  [facility] notified of new cpap order  Please add to [facility]

## 2020-08-07 NOTE — Telephone Encounter (Signed)
Patient in the office today to fr [place]. Per Ivin Booty, patient never got set up because his wife is sick and he could to afford the cpap. Patient will need documentation as to why he delayed his set up and any symptoms he may be having.

## 2020-08-07 NOTE — Telephone Encounter (Signed)
-----   Message from Sueanne Margarita, MD sent at 08/07/2020  2:20 PM EDT ----- ResMed CPAP on auto from 4 to 15cm H2O with heated humidity and mask of choice and get a download in 2 weeks -followup with me in 6-8 weeks for follwoup

## 2020-08-07 NOTE — Progress Notes (Signed)
Cardiology Office Note:    Date:  08/07/2020   ID:  [name], DOB 06/03/1945, MRN [id number]  PCP:  [name], MD  Cardiologist:  Fransico Him, MD    Referring MD: [name], MD   Chief Complaint  Patient presents with  . Coronary Artery Disease  . Cardiomyopathy  . Hyperlipidemia  . Hypertension    History of Present Illness:    [name] is a 75 y.o. 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  April 2021 he was admitted with urosepsis due to obstructing renal calculus and developed hypoxic respiratory failure and elevated hsTrop.  Cath was done showing severe 3 vCAD and surgery was consulted but felt PCI along with medical management was best option.  He underwent PCI with DES to pRCA and [place].  He was discharged on DAPT with aspirin and Brilinta. To have at least one month of DAPT therapy.   When I saw him in May 2021 he was doing well.  He was noted to have moderate PHTN with PASP 1mmHg on echo.  VQ scan showed no PE and PFTs were ordered but not done.  He underwent sleep study in Aug 2021 showing mild OSA with an AHI of 12.3/hr and O2 sats as low as 88% and was placed on auto CPAP.    He is here today for followup and is doing well.  He denies any chest pain or pressure, PND, orthopnea, dizziness, palpitations or syncope. He has chronic DOE that he thinks is stable.  He has chronic LLE edema which he thinks is stable.  He is compliant with his meds and is tolerating meds with no SE. He never followed through on getting set up on CPAP because his wife has been sick.   Past Medical History:  Diagnosis Date  . CHF (congestive heart failure) (HCC)    EF 45-50% on 08/2019 echo  . Chronic kidney disease    RENAL STONES  . Coronary artery disease    3 vessel disease: DES to RCA, DES to LCx 08/2019 and medical management  . High frequency hearing loss   . History of migraine headaches   . Hx of colonic polyps   .  Hyperlipidemia   . Hypertension   . Obesity   . Pulmonary hypertension ([place])   . Seborrheic dermatitis   . Vitiligo     Past Surgical History:  Procedure Laterality Date  . COLON SURGERY  01/2001  . CORONARY STENT INTERVENTION N/A 08/12/2019   Procedure: CORONARY STENT INTERVENTION;  Surgeon: Nelva Bush, MD;  Location: [facility];  Service: Cardiovascular;  Laterality: N/A;  . CYSTOSCOPY/URETEROSCOPY/HOLMIUM LASER/STENT PLACEMENT Left 02/04/2019   Procedure: CYSTOSCOPY Left retrograde and attempted left ureteroscopy;  Surgeon: Raynelle Bring, MD;  Location: [facility];  Service: Urology;  Laterality: Left;  ONLY NEEDS 60 MIN  . CYSTOSCOPY/URETEROSCOPY/HOLMIUM LASER/STENT PLACEMENT Left 11/07/2019   Procedure: CYSTOSCOPY/URETEROSCOPY/HOLMIUM LASER/STENT PLACEMENT;  Surgeon: Raynelle Bring, MD;  Location: [facility];  Service: Urology;  Laterality: Left;  . FRACTURE SURGERY    . INTRAVASCULAR ULTRASOUND/IVUS N/A 08/12/2019   Procedure: Intravascular Ultrasound/IVUS;  Surgeon: Nelva Bush, MD;  Location: [facility];  Service: Cardiovascular;  Laterality: N/A;  . IR CONVERT LEFT NEPHROSTOMY TO NEPHROURETERAL CATH  09/17/2019  . IR NEPHROSTOMY PLACEMENT LEFT  02/05/2019  . IR NEPHROSTOMY PLACEMENT LEFT  08/06/2019  . LEFT HEART CATH AND CORONARY ANGIOGRAPHY N/A 08/09/2019  Procedure: LEFT HEART CATH AND CORONARY ANGIOGRAPHY;  Surgeon: Jettie Booze, MD;  Location: [facility];  Service: Cardiovascular;  Laterality: N/A;  . TONSILLECTOMY AND ADENOIDECTOMY      Current Medications: Current Meds  Medication Sig  . aspirin EC 81 MG EC tablet Take 1 tablet (81 mg total) by mouth daily.  Marland Kitchen ezetimibe (ZETIA) 10 MG tablet Take 1 tablet (10 mg total) by mouth daily.  Marland Kitchen lisinopril (ZESTRIL) 5 MG tablet Take 1 tablet (5 mg total) by mouth daily.  . metoprolol succinate (TOPROL-XL) 50 MG 24 hr tablet Take 1 tablet (50 mg total) by mouth daily. Take with or immediately  following a meal.  . rosuvastatin (CRESTOR) 5 MG tablet Take 1 tablet (5 mg total) by mouth 2 (two) times a week.  . ticagrelor (BRILINTA) 90 MG TABS tablet Take 1 tablet (90 mg total) by mouth 2 (two) times daily.     Allergies:   Codeine   Social History   Socioeconomic History  . Marital status: Married    Spouse name: Not on file  . Number of children: Not on file  . Years of education: Not on file  . Highest education level: Not on file  Occupational History  . Not on file  Tobacco Use  . Smoking status: Former Smoker    Packs/day: 1.00    Years: 40.00    Pack years: 40.00    Types: Cigarettes    Quit date: 1997    Years since quitting: 25.2  . Smokeless tobacco: Never Used  Vaping Use  . Vaping Use: Never used  Substance and Sexual Activity  . Alcohol use: Not Currently  . Drug use: Never  . Sexual activity: Not on file  Other Topics Concern  . Not on file  Social History Narrative  . Not on file   Social Determinants of Health   Financial Resource Strain: Not on file  Food Insecurity: Not on file  Transportation Needs: Not on file  Physical Activity: Not on file  Stress: Not on file  Social Connections: Not on file     Family History: The patient's family history is not on file.  ROS:   Please see the history of present illness.    ROS  All other systems reviewed and negative.   EKGs/Labs/Other Studies Reviewed:    The following studies were reviewed today: Cardiac cath, 2D echo, sleep study, PAP compliance download   EKG:  EKG is  ordered today and showed NSR with no ST changes Recent Labs: 10/25/2019: BUN 19; Creatinine, Ser 1.93; Hemoglobin 13.3; Platelets 296; Potassium 4.5; Sodium 136   Recent Lipid Panel    Component Value Date/Time   CHOL 175 05/07/2020 0733   TRIG 159 (H) 05/07/2020 0733   HDL 31 (L) 05/07/2020 0733   CHOLHDL 5.6 (H) 05/07/2020 0733   CHOLHDL 9.0 08/09/2019 0521   VLDL 47 (H) 08/09/2019 0521   LDLCALC 116 (H)  05/07/2020 0733    Physical Exam:    VS:  BP 130/80   Pulse 79   Ht 5\' 8"  (1.727 m)   Wt 263 lb 12.8 oz (119.7 kg)   SpO2 97%   BMI 40.11 kg/m     Wt Readings from Last 3 Encounters:  08/07/20 263 lb 12.8 oz (119.7 kg)  12/31/19 240 lb (108.9 kg)  11/07/19 242 lb 9.6 oz (110 kg)     GEN: Well nourished, well developed in no acute distress HEENT: Normal NECK:  No JVD; No carotid bruits LYMPHATICS: No lymphadenopathy CARDIAC:RRR, no murmurs, rubs, gallops RESPIRATORY:  Clear to auscultation without rales, wheezing or rhonchi  ABDOMEN: Soft, non-tender, non-distended MUSCULOSKELETAL: trace LE edema; No deformity  SKIN: Warm and dry NEUROLOGIC:  Alert and oriented x 3 PSYCHIATRIC:  Normal affect    ASSESSMENT:    1. OSA (obstructive sleep apnea)   2. Coronary artery disease due to lipid rich plaque   3. Ischemic cardiomyopathy   4. Pure hypercholesterolemia   5. Essential hypertension    PLAN:    In order of problems listed above  OSA -he has mild OSA by sleep study but has not gotten set up on CPAP yet due to his wife being ill -I will order a ResMed CPAP on auto from 4 to 15cm H2O with heated humidity and mask of choice and get a download in 2 weeks -followup with me in 6-8 weeks for follwoup  ASCAD -s/p type 2 NSTEMI due to demand ischemia in the setting of acute respiratory failure, CAD and sepsis -cath showed severe 3VCAD >> surgery was consulted but felt PCI along with medical management was best option. -s/p PCI to pRCA and pLCX with residual disease noted with 99% CTO mLAD.  -continue ASA 81mg  daily, Crestor and Toprol XL 50mg  daily -decrease Brilinta to 60mg  BID  Ischemic CM  -volume status difficult to assess in setting of morbid obesity but overall appears euvolemic -mild reduction in his EF 45-50% by echo 08/2019 but normalized on repeat echo 03/2020 at 55-60% -continue on ACE and BB  -repeat echo 03/2020 showed  -SCr 1.93 a year ago -repeat  BMET  HLD  -LDL goal < 70 -LDL 116 in Jan 2022 -changed to Crestor 5mg  2 times weekly and Zetia 10mg  daily -repeat FLP and ALT  Pulmonary HTN -PASP 40mmHg on echo 08/2019 -VQ scan negative for chronic thromboembolic disease -he never followed through with PFTs so I will reorder -sleep study showed mild OSA but has not been set up on PAP yet>>discussed the role OSA plans in PHTN with him today and the need to treat his OSA -2D echo 03/2020 could not assess for PAP due to lack of TR jet -continue diuretics  HTN -BP is well controlled on exam today -continue Toprol XL 50mg  daily and Lisinopril 5mg  daily  Medication Adjustments/Labs and Tests Ordered: Current medicines are reviewed at length with the patient today.  Concerns regarding medicines are outlined above.  Orders Placed This Encounter  Procedures  . EKG 12-Lead   No orders of the defined types were placed in this encounter.   Signed, Fransico Him, MD  08/07/2020 2:16 PM    [facility]

## 2020-08-07 NOTE — Patient Instructions (Addendum)
Medication Instructions:  Your physician has recommended you make the following change in your medication: Decrease Brilinta to 60 mg by mouth twice daily  *If you need a refill on your cardiac medications before your next appointment, please call your pharmacy*   Lab Work: Your physician recommends that you return for lab work--CMET and Lipid.  This will be fasting.  The lab opens at 7:30 AM  If you have labs (blood work) drawn today and your tests are completely normal, you will receive your results only by: Marland Kitchen MyChart Message (if you have MyChart) OR . A paper copy in the mail If you have any lab test that is abnormal or we need to change your treatment, we will call you to review the results.   Testing/Procedures: Your physician has recommended that you have a pulmonary function test. Pulmonary Function Tests are a group of tests that measure how well air moves in and out of your lungs.  To be done at [facility]: At [facility], you and your health needs are our priority.  As part of our continuing mission to provide you with exceptional heart care, we have created designated Provider Care Teams.  These Care Teams include your primary Cardiologist (physician) and Advanced Practice Providers (APPs -  Physician Assistants and Nurse Practitioners) who all work together to provide you with the care you need, when you need it.  We recommend signing up for the patient portal called "MyChart".  Sign up information is provided on this After Visit Summary.  MyChart is used to connect with patients for Virtual Visits (Telemedicine).  Patients are able to view lab/test results, encounter notes, upcoming appointments, etc.  Non-urgent messages can be sent to your provider as well.   To learn more about what you can do with MyChart, go to [email or web address].    Your next appointment:   12 month(s)  The format for your next appointment:   In Person  Provider:   You may see  Fransico Him, MD or one of the following Advanced Practice Providers on your designated Care Team:    Melina Copa, PA-C  Ermalinda Barrios, PA-C    Other Instructions

## 2020-08-10 ENCOUNTER — Other Ambulatory Visit: Payer: Self-pay

## 2020-08-10 ENCOUNTER — Other Ambulatory Visit: Payer: Medicare Other | Admitting: *Deleted

## 2020-08-10 DIAGNOSIS — I10 Essential (primary) hypertension: Secondary | ICD-10-CM

## 2020-08-10 DIAGNOSIS — I2583 Coronary atherosclerosis due to lipid rich plaque: Secondary | ICD-10-CM

## 2020-08-10 DIAGNOSIS — E7800 Pure hypercholesterolemia, unspecified: Secondary | ICD-10-CM

## 2020-08-10 DIAGNOSIS — I255 Ischemic cardiomyopathy: Secondary | ICD-10-CM

## 2020-08-10 DIAGNOSIS — I2510 Atherosclerotic heart disease of native coronary artery without angina pectoris: Secondary | ICD-10-CM

## 2020-08-10 LAB — COMPREHENSIVE METABOLIC PANEL
ALT: 19 IU/L (ref 0–44)
AST: 14 IU/L (ref 0–40)
Albumin/Globulin Ratio: 1.8 (ref 1.2–2.2)
Albumin: 4 g/dL (ref 3.7–4.7)
Alkaline Phosphatase: 93 IU/L (ref 44–121)
BUN/Creatinine Ratio: 12 (ref 10–24)
BUN: 17 mg/dL (ref 8–27)
Bilirubin Total: 0.5 mg/dL (ref 0.0–1.2)
CO2: 21 mmol/L (ref 20–29)
Calcium: 8.8 mg/dL (ref 8.6–10.2)
Chloride: 103 mmol/L (ref 96–106)
Creatinine, Ser: 1.43 mg/dL — ABNORMAL HIGH (ref 0.76–1.27)
Globulin, Total: 2.2 g/dL (ref 1.5–4.5)
Glucose: 87 mg/dL (ref 65–99)
Potassium: 4.6 mmol/L (ref 3.5–5.2)
Sodium: 139 mmol/L (ref 134–144)
Total Protein: 6.2 g/dL (ref 6.0–8.5)
eGFR: 51 mL/min/{1.73_m2} — ABNORMAL LOW (ref >59)

## 2020-08-10 LAB — LIPID PANEL
Chol/HDL Ratio: 3.8 ratio (ref 0.0–5.0)
Cholesterol, Total: 106 mg/dL (ref 100–199)
HDL: 28 mg/dL — ABNORMAL LOW (ref >39)
LDL Chol Calc (NIH): 59 mg/dL (ref 0–99)
Triglycerides: 96 mg/dL (ref 0–149)
VLDL Cholesterol Cal: 19 mg/dL (ref 5–40)

## 2020-08-17 ENCOUNTER — Other Ambulatory Visit (HOSPITAL_COMMUNITY): Payer: Medicare Other

## 2020-09-18 ENCOUNTER — Other Ambulatory Visit (HOSPITAL_COMMUNITY)
Admission: RE | Admit: 2020-09-18 | Discharge: 2020-09-18 | Disposition: A | Discharge status: Home / Self Care | Payer: Medicare Other | Source: Ambulatory Visit | Attending: Cardiology | Admitting: Cardiology

## 2020-09-18 ENCOUNTER — Other Ambulatory Visit: Payer: Self-pay

## 2020-09-18 DIAGNOSIS — Z01812 Encounter for preprocedural laboratory examination: Secondary | ICD-10-CM | POA: Diagnosis present

## 2020-09-18 DIAGNOSIS — Z20822 Contact with and (suspected) exposure to covid-19: Secondary | ICD-10-CM | POA: Insufficient documentation

## 2020-09-18 LAB — SARS CORONAVIRUS 2 (TAT 6-24 HRS): SARS Coronavirus 2: NEGATIVE

## 2020-09-22 ENCOUNTER — Other Ambulatory Visit: Payer: Self-pay

## 2020-09-22 ENCOUNTER — Ambulatory Visit (HOSPITAL_COMMUNITY)
Admission: RE | Admit: 2020-09-22 | Discharge: 2020-09-22 | Disposition: A | Discharge status: Home / Self Care | Payer: Medicare Other | Source: Ambulatory Visit | Attending: Cardiology | Admitting: Cardiology

## 2020-09-22 DIAGNOSIS — G4733 Obstructive sleep apnea (adult) (pediatric): Secondary | ICD-10-CM | POA: Diagnosis present

## 2020-09-22 DIAGNOSIS — I272 Pulmonary hypertension, unspecified: Secondary | ICD-10-CM | POA: Insufficient documentation

## 2020-09-22 LAB — PULMONARY FUNCTION TEST
DL/VA % pred: 102 %
DL/VA: 4.08 ml/min/mmHg/L
DLCO unc % pred: 78 %
DLCO unc: 18.5 ml/min/mmHg
FEF 25-75 Post: 3.06 L/sec
FEF 25-75 Pre: 2.15 L/sec
FEF2575-%Change-Post: 41 %
FEF2575-%Pred-Post: 153 %
FEF2575-%Pred-Pre: 108 %
FEV1-%Change-Post: 7 %
FEV1-%Pred-Post: 84 %
FEV1-%Pred-Pre: 78 %
FEV1-Post: 2.34 L
FEV1-Pre: 2.18 L
FEV1FVC-%Change-Post: -1 %
FEV1FVC-%Pred-Pre: 112 %
FEV6-%Change-Post: 9 %
FEV6-%Pred-Post: 80 %
FEV6-%Pred-Pre: 74 %
FEV6-Post: 2.92 L
FEV6-Pre: 2.68 L
FEV6FVC-%Pred-Post: 107 %
FEV6FVC-%Pred-Pre: 107 %
FVC-%Change-Post: 9 %
FVC-%Pred-Post: 75 %
FVC-%Pred-Pre: 69 %
FVC-Post: 2.92 L
FVC-Pre: 2.68 L
Post FEV1/FVC ratio: 80 %
Post FEV6/FVC ratio: 100 %
Pre FEV1/FVC ratio: 81 %
Pre FEV6/FVC Ratio: 100 %
RV % pred: 94 %
RV: 2.32 L
TLC % pred: 80 %
TLC: 5.32 L

## 2020-09-22 MED ORDER — ALBUTEROL SULFATE (2.5 MG/3ML) 0.083% IN NEBU
2.5000 mg | INHALATION_SOLUTION | Freq: Once | RESPIRATORY_TRACT | Status: AC
  Administered 2020-09-22: 2.5 mg via RESPIRATORY_TRACT

## 2020-09-23 ENCOUNTER — Telehealth: Payer: Self-pay | Admitting: Cardiology

## 2020-09-23 NOTE — Telephone Encounter (Signed)
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  All questions (if any) were answered. Antonieta Iba, RN 09/23/2020 4:42 PM

## 2020-09-23 NOTE — Telephone Encounter (Signed)
Patient is returning call to discuss PFT results.

## 2020-09-30 ENCOUNTER — Telehealth: Payer: Self-pay

## 2020-09-30 NOTE — Telephone Encounter (Signed)
Notes on file.

## 2021-01-16 ENCOUNTER — Other Ambulatory Visit: Payer: Self-pay | Admitting: Cardiology

## 2021-01-16 DIAGNOSIS — E7800 Pure hypercholesterolemia, unspecified: Secondary | ICD-10-CM

## 2021-01-24 ENCOUNTER — Inpatient Hospital Stay (HOSPITAL_BASED_OUTPATIENT_CLINIC_OR_DEPARTMENT_OTHER)
Admission: EM | Admit: 2021-01-24 | Discharge: 2021-02-11 | DRG: 904 | Disposition: A | Discharge status: Skilled Nursing Facility | Payer: Medicare Other | Attending: Family Medicine | Admitting: Family Medicine

## 2021-01-24 ENCOUNTER — Encounter (HOSPITAL_COMMUNITY)
Admission: EM | Disposition: A | Discharge status: Skilled Nursing Facility | Payer: Self-pay | Source: Home / Self Care | Attending: Urology

## 2021-01-24 ENCOUNTER — Emergency Department (HOSPITAL_BASED_OUTPATIENT_CLINIC_OR_DEPARTMENT_OTHER): Payer: Medicare Other

## 2021-01-24 ENCOUNTER — Other Ambulatory Visit: Payer: Self-pay

## 2021-01-24 ENCOUNTER — Encounter (HOSPITAL_BASED_OUTPATIENT_CLINIC_OR_DEPARTMENT_OTHER): Payer: Self-pay | Admitting: *Deleted

## 2021-01-24 ENCOUNTER — Inpatient Hospital Stay (HOSPITAL_COMMUNITY): Payer: Medicare Other | Admitting: Anesthesiology

## 2021-01-24 DIAGNOSIS — Z885 Allergy status to narcotic agent status: Secondary | ICD-10-CM

## 2021-01-24 DIAGNOSIS — T148XXA Other injury of unspecified body region, initial encounter: Secondary | ICD-10-CM

## 2021-01-24 DIAGNOSIS — I2510 Atherosclerotic heart disease of native coronary artery without angina pectoris: Secondary | ICD-10-CM | POA: Diagnosis present

## 2021-01-24 DIAGNOSIS — N179 Acute kidney failure, unspecified: Secondary | ICD-10-CM | POA: Diagnosis present

## 2021-01-24 DIAGNOSIS — E611 Iron deficiency: Secondary | ICD-10-CM | POA: Diagnosis present

## 2021-01-24 DIAGNOSIS — K644 Residual hemorrhoidal skin tags: Secondary | ICD-10-CM | POA: Diagnosis present

## 2021-01-24 DIAGNOSIS — I129 Hypertensive chronic kidney disease with stage 1 through stage 4 chronic kidney disease, or unspecified chronic kidney disease: Secondary | ICD-10-CM | POA: Diagnosis present

## 2021-01-24 DIAGNOSIS — T79A21A Traumatic compartment syndrome of right lower extremity, initial encounter: Secondary | ICD-10-CM | POA: Diagnosis present

## 2021-01-24 DIAGNOSIS — Y846 Urinary catheterization as the cause of abnormal reaction of the patient, or of later complication, without mention of misadventure at the time of the procedure: Secondary | ICD-10-CM | POA: Diagnosis present

## 2021-01-24 DIAGNOSIS — R319 Hematuria, unspecified: Secondary | ICD-10-CM | POA: Diagnosis not present

## 2021-01-24 DIAGNOSIS — I272 Pulmonary hypertension, unspecified: Secondary | ICD-10-CM | POA: Diagnosis present

## 2021-01-24 DIAGNOSIS — Z20822 Contact with and (suspected) exposure to covid-19: Secondary | ICD-10-CM | POA: Diagnosis present

## 2021-01-24 DIAGNOSIS — Z79899 Other long term (current) drug therapy: Secondary | ICD-10-CM

## 2021-01-24 DIAGNOSIS — N1832 Chronic kidney disease, stage 3b: Secondary | ICD-10-CM | POA: Diagnosis present

## 2021-01-24 DIAGNOSIS — E785 Hyperlipidemia, unspecified: Secondary | ICD-10-CM | POA: Diagnosis present

## 2021-01-24 DIAGNOSIS — R338 Other retention of urine: Secondary | ICD-10-CM | POA: Diagnosis not present

## 2021-01-24 DIAGNOSIS — D62 Acute posthemorrhagic anemia: Secondary | ICD-10-CM | POA: Diagnosis not present

## 2021-01-24 DIAGNOSIS — J449 Chronic obstructive pulmonary disease, unspecified: Secondary | ICD-10-CM | POA: Diagnosis present

## 2021-01-24 DIAGNOSIS — K6289 Other specified diseases of anus and rectum: Secondary | ICD-10-CM | POA: Diagnosis not present

## 2021-01-24 DIAGNOSIS — I252 Old myocardial infarction: Secondary | ICD-10-CM

## 2021-01-24 DIAGNOSIS — T8383XA Hemorrhage of genitourinary prosthetic devices, implants and grafts, initial encounter: Secondary | ICD-10-CM | POA: Diagnosis not present

## 2021-01-24 DIAGNOSIS — Z6841 Body Mass Index (BMI) 40.0 and over, adult: Secondary | ICD-10-CM | POA: Diagnosis not present

## 2021-01-24 DIAGNOSIS — R310 Gross hematuria: Secondary | ICD-10-CM | POA: Diagnosis not present

## 2021-01-24 DIAGNOSIS — I25118 Atherosclerotic heart disease of native coronary artery with other forms of angina pectoris: Secondary | ICD-10-CM | POA: Diagnosis not present

## 2021-01-24 DIAGNOSIS — H9190 Unspecified hearing loss, unspecified ear: Secondary | ICD-10-CM | POA: Diagnosis present

## 2021-01-24 DIAGNOSIS — T79A21D Traumatic compartment syndrome of right lower extremity, subsequent encounter: Secondary | ICD-10-CM | POA: Diagnosis not present

## 2021-01-24 DIAGNOSIS — N390 Urinary tract infection, site not specified: Secondary | ICD-10-CM | POA: Diagnosis not present

## 2021-01-24 DIAGNOSIS — I2583 Coronary atherosclerosis due to lipid rich plaque: Secondary | ICD-10-CM | POA: Diagnosis present

## 2021-01-24 DIAGNOSIS — W230XXA Caught, crushed, jammed, or pinched between moving objects, initial encounter: Secondary | ICD-10-CM | POA: Diagnosis present

## 2021-01-24 DIAGNOSIS — Z7902 Long term (current) use of antithrombotics/antiplatelets: Secondary | ICD-10-CM

## 2021-01-24 DIAGNOSIS — R32 Unspecified urinary incontinence: Secondary | ICD-10-CM | POA: Diagnosis present

## 2021-01-24 DIAGNOSIS — Z87891 Personal history of nicotine dependence: Secondary | ICD-10-CM

## 2021-01-24 DIAGNOSIS — Z7982 Long term (current) use of aspirin: Secondary | ICD-10-CM

## 2021-01-24 DIAGNOSIS — E875 Hyperkalemia: Secondary | ICD-10-CM | POA: Diagnosis present

## 2021-01-24 DIAGNOSIS — N401 Enlarged prostate with lower urinary tract symptoms: Secondary | ICD-10-CM | POA: Diagnosis present

## 2021-01-24 DIAGNOSIS — R55 Syncope and collapse: Secondary | ICD-10-CM | POA: Diagnosis not present

## 2021-01-24 DIAGNOSIS — L80 Vitiligo: Secondary | ICD-10-CM | POA: Diagnosis present

## 2021-01-24 DIAGNOSIS — T79A0XA Compartment syndrome, unspecified, initial encounter: Secondary | ICD-10-CM | POA: Diagnosis present

## 2021-01-24 DIAGNOSIS — Z955 Presence of coronary angioplasty implant and graft: Secondary | ICD-10-CM

## 2021-01-24 HISTORY — PX: FASCIOTOMY: SHX132

## 2021-01-24 HISTORY — DX: Malignant (primary) neoplasm, unspecified: C80.1

## 2021-01-24 HISTORY — PX: APPLICATION OF WOUND VAC: SHX5189

## 2021-01-24 LAB — CBC WITH DIFFERENTIAL/PLATELET
Abs Immature Granulocytes: 0.04 10*3/uL (ref 0.00–0.07)
Basophils Absolute: 0.1 10*3/uL (ref 0.0–0.1)
Basophils Relative: 1 %
Eosinophils Absolute: 0.2 10*3/uL (ref 0.0–0.5)
Eosinophils Relative: 2 %
HCT: 44.2 % (ref 39.0–52.0)
Hemoglobin: 14.8 g/dL (ref 13.0–17.0)
Immature Granulocytes: 0 %
Lymphocytes Relative: 10 %
Lymphs Abs: 0.9 10*3/uL (ref 0.7–4.0)
MCH: 28.6 pg (ref 26.0–34.0)
MCHC: 33.5 g/dL (ref 30.0–36.0)
MCV: 85.5 fL (ref 80.0–100.0)
Monocytes Absolute: 1.1 10*3/uL — ABNORMAL HIGH (ref 0.1–1.0)
Monocytes Relative: 12 %
Neutro Abs: 6.8 10*3/uL (ref 1.7–7.7)
Neutrophils Relative %: 75 %
Platelets: 248 10*3/uL (ref 150–400)
RBC: 5.17 MIL/uL (ref 4.22–5.81)
RDW: 14.5 % (ref 11.5–15.5)
WBC: 9.1 10*3/uL (ref 4.0–10.5)
nRBC: 0 % (ref 0.0–0.2)

## 2021-01-24 LAB — BASIC METABOLIC PANEL
Anion gap: 9 (ref 5–15)
BUN: 19 mg/dL (ref 8–23)
CO2: 24 mmol/L (ref 22–32)
Calcium: 9.1 mg/dL (ref 8.9–10.3)
Chloride: 106 mmol/L (ref 98–111)
Creatinine, Ser: 1.36 mg/dL — ABNORMAL HIGH (ref 0.61–1.24)
GFR, Estimated: 54 mL/min — ABNORMAL LOW (ref >60)
Glucose, Bld: 139 mg/dL — ABNORMAL HIGH (ref 70–99)
Potassium: 4 mmol/L (ref 3.5–5.1)
Sodium: 139 mmol/L (ref 135–145)

## 2021-01-24 LAB — CK: Total CK: 77 U/L (ref 49–397)

## 2021-01-24 LAB — POC SARS CORONAVIRUS 2 AG: SARSCOV2ONAVIRUS 2 AG: NEGATIVE

## 2021-01-24 LAB — TROPONIN I (HIGH SENSITIVITY): Troponin I (High Sensitivity): 7 ng/L (ref <18)

## 2021-01-24 LAB — SARS CORONAVIRUS 2 BY RT PCR (HOSPITAL ORDER, PERFORMED IN ~~LOC~~ HOSPITAL LAB): SARS Coronavirus 2: NEGATIVE

## 2021-01-24 SURGERY — APPLICATION, WOUND VAC
Anesthesia: General | Site: Leg Lower | Laterality: Right

## 2021-01-24 MED ORDER — CHLORHEXIDINE GLUCONATE 0.12 % MT SOLN: 15.0000 mL | Freq: Once | OROMUCOSAL | Status: AC

## 2021-01-24 MED ORDER — PHENYLEPHRINE HCL-NACL 20-0.9 MG/250ML-% IV SOLN
INTRAVENOUS | Status: DC | PRN
  Administered 2021-01-24: 20 ug/min via INTRAVENOUS

## 2021-01-24 MED ORDER — SODIUM CHLORIDE 0.9 % IV BOLUS: 500.0000 mL | Freq: Once | INTRAVENOUS | Status: DC

## 2021-01-24 MED ORDER — CEFAZOLIN SODIUM-DEXTROSE 2-4 GM/100ML-% IV SOLN
2.0000 g | Freq: Once | INTRAVENOUS | Status: AC
  Administered 2021-01-24: 2 g via INTRAVENOUS
  Filled 2021-01-24: qty 100

## 2021-01-24 MED ORDER — IOHEXOL 350 MG/ML SOLN
60.0000 mL | Freq: Once | INTRAVENOUS | Status: AC | PRN
  Administered 2021-01-24: 60 mL via INTRAVENOUS

## 2021-01-24 MED ORDER — HYDROCODONE-ACETAMINOPHEN 5-325 MG PO TABS
1.0000 | ORAL_TABLET | Freq: Four times a day (QID) | ORAL | Status: DC | PRN
  Administered 2021-01-24 – 2021-01-25 (×3): 2 via ORAL
  Filled 2021-01-24 (×4): qty 2

## 2021-01-24 MED ORDER — ENOXAPARIN SODIUM 40 MG/0.4ML IJ SOSY
40.0000 mg | PREFILLED_SYRINGE | INTRAMUSCULAR | Status: DC
  Administered 2021-01-25 – 2021-01-30 (×6): 40 mg via SUBCUTANEOUS
  Filled 2021-01-24 (×6): qty 0.4

## 2021-01-24 MED ORDER — DEXAMETHASONE SODIUM PHOSPHATE 10 MG/ML IJ SOLN
INTRAMUSCULAR | Status: DC | PRN
  Administered 2021-01-24: 5 mg via INTRAVENOUS

## 2021-01-24 MED ORDER — FENTANYL CITRATE (PF) 250 MCG/5ML IJ SOLN
INTRAMUSCULAR | Status: AC
  Filled 2021-01-24: qty 5

## 2021-01-24 MED ORDER — EZETIMIBE 10 MG PO TABS
10.0000 mg | ORAL_TABLET | Freq: Every day | ORAL | Status: DC
  Administered 2021-01-25 – 2021-02-11 (×16): 10 mg via ORAL
  Filled 2021-01-24 (×16): qty 1

## 2021-01-24 MED ORDER — LACTATED RINGERS IV SOLN: INTRAVENOUS | Status: DC

## 2021-01-24 MED ORDER — LISINOPRIL 5 MG PO TABS
5.0000 mg | ORAL_TABLET | Freq: Every day | ORAL | Status: DC
  Administered 2021-01-25 – 2021-01-26 (×2): 5 mg via ORAL
  Filled 2021-01-24 (×2): qty 1

## 2021-01-24 MED ORDER — 0.9 % SODIUM CHLORIDE (POUR BTL) OPTIME
TOPICAL | Status: DC | PRN
  Administered 2021-01-24: 1000 mL

## 2021-01-24 MED ORDER — MORPHINE SULFATE (PF) 2 MG/ML IV SOLN: 0.5000 mg | INTRAVENOUS | Status: DC | PRN

## 2021-01-24 MED ORDER — EPHEDRINE SULFATE 50 MG/ML IJ SOLN
INTRAMUSCULAR | Status: DC | PRN
  Administered 2021-01-24: 5 mg via INTRAVENOUS

## 2021-01-24 MED ORDER — SUCCINYLCHOLINE CHLORIDE 200 MG/10ML IV SOSY
PREFILLED_SYRINGE | INTRAVENOUS | Status: DC | PRN
  Administered 2021-01-24: 140 mg via INTRAVENOUS

## 2021-01-24 MED ORDER — ASPIRIN EC 81 MG PO TBEC
81.0000 mg | DELAYED_RELEASE_TABLET | Freq: Every day | ORAL | Status: DC
  Administered 2021-01-25 – 2021-01-26 (×2): 81 mg via ORAL
  Filled 2021-01-24 (×2): qty 1

## 2021-01-24 MED ORDER — FENTANYL CITRATE (PF) 100 MCG/2ML IJ SOLN
INTRAMUSCULAR | Status: AC
  Administered 2021-01-24: 25 ug via INTRAVENOUS
  Filled 2021-01-24: qty 2

## 2021-01-24 MED ORDER — LACTATED RINGERS IV SOLN: INTRAVENOUS | Status: DC | PRN

## 2021-01-24 MED ORDER — ONDANSETRON HCL 4 MG/2ML IJ SOLN: 4.0000 mg | Freq: Once | INTRAMUSCULAR | Status: DC | PRN

## 2021-01-24 MED ORDER — METOPROLOL SUCCINATE ER 50 MG PO TB24: 50.0000 mg | ORAL_TABLET | Freq: Every day | ORAL | Status: DC

## 2021-01-24 MED ORDER — PROPOFOL 10 MG/ML IV BOLUS
INTRAVENOUS | Status: AC
  Filled 2021-01-24: qty 20

## 2021-01-24 MED ORDER — MIDAZOLAM HCL 2 MG/2ML IJ SOLN
INTRAMUSCULAR | Status: AC
  Filled 2021-01-24: qty 2

## 2021-01-24 MED ORDER — ONDANSETRON HCL 4 MG/2ML IJ SOLN
INTRAMUSCULAR | Status: DC | PRN
  Administered 2021-01-24: 4 mg via INTRAVENOUS

## 2021-01-24 MED ORDER — CHLORHEXIDINE GLUCONATE 0.12 % MT SOLN
OROMUCOSAL | Status: AC
  Administered 2021-01-24: 15 mL via OROMUCOSAL
  Filled 2021-01-24: qty 15

## 2021-01-24 MED ORDER — ROSUVASTATIN CALCIUM 5 MG PO TABS
5.0000 mg | ORAL_TABLET | ORAL | Status: DC
  Administered 2021-01-28 – 2021-02-11 (×5): 5 mg via ORAL
  Filled 2021-01-24 (×6): qty 1

## 2021-01-24 MED ORDER — HYDROMORPHONE HCL 1 MG/ML IJ SOLN
0.5000 mg | INTRAMUSCULAR | Status: DC | PRN
Start: 2021-01-24 — End: 2021-02-11
  Administered 2021-01-25 – 2021-02-08 (×15): 0.5 mg via INTRAVENOUS
  Filled 2021-01-24 (×15): qty 0.5

## 2021-01-24 MED ORDER — FENTANYL CITRATE (PF) 100 MCG/2ML IJ SOLN
25.0000 ug | INTRAMUSCULAR | Status: DC | PRN
  Administered 2021-01-24 (×2): 25 ug via INTRAVENOUS

## 2021-01-24 MED ORDER — PROPOFOL 10 MG/ML IV BOLUS
INTRAVENOUS | Status: DC | PRN
  Administered 2021-01-24: 140 mg via INTRAVENOUS

## 2021-01-24 MED ORDER — LIDOCAINE 2% (20 MG/ML) 5 ML SYRINGE
INTRAMUSCULAR | Status: DC | PRN
  Administered 2021-01-24: 60 mg via INTRAVENOUS

## 2021-01-24 MED ORDER — FENTANYL CITRATE (PF) 100 MCG/2ML IJ SOLN
INTRAMUSCULAR | Status: DC | PRN
  Administered 2021-01-24 (×3): 50 ug via INTRAVENOUS
  Administered 2021-01-24: 100 ug via INTRAVENOUS

## 2021-01-24 SURGICAL SUPPLY — 29 items
BLADE SURG 10 STRL SS (BLADE) ×3 IMPLANT
CANISTER WOUND CARE 500ML ATS (WOUND CARE) ×3 IMPLANT
CHLORAPREP W/TINT 26 (MISCELLANEOUS) ×3 IMPLANT
COVER SURGICAL LIGHT HANDLE (MISCELLANEOUS) ×3 IMPLANT
DRAPE U-SHAPE 47X51 STRL (DRAPES) ×3 IMPLANT
DRSG ADAPTIC 3X8 NADH LF (GAUZE/BANDAGES/DRESSINGS) ×3 IMPLANT
DRSG VAC ATS LRG SENSATRAC (GAUZE/BANDAGES/DRESSINGS) ×3 IMPLANT
ELECT REM PT RETURN 9FT ADLT (ELECTROSURGICAL) ×3
ELECTRODE REM PT RTRN 9FT ADLT (ELECTROSURGICAL) ×2 IMPLANT
GLOVE SRG 8 PF TXTR STRL LF DI (GLOVE) ×2 IMPLANT
GLOVE SURG ORTHO LTX SZ7.5 (GLOVE) ×3 IMPLANT
GLOVE SURG UNDER POLY LF SZ8 (GLOVE) ×3
GOWN STRL REUS W/ TWL LRG LVL3 (GOWN DISPOSABLE) ×2 IMPLANT
GOWN STRL REUS W/ TWL XL LVL3 (GOWN DISPOSABLE) ×2 IMPLANT
GOWN STRL REUS W/TWL LRG LVL3 (GOWN DISPOSABLE) ×3
GOWN STRL REUS W/TWL XL LVL3 (GOWN DISPOSABLE) ×3
KIT BASIN OR (CUSTOM PROCEDURE TRAY) ×3 IMPLANT
KIT TURNOVER KIT B (KITS) ×3 IMPLANT
MANIFOLD NEPTUNE II (INSTRUMENTS) ×3 IMPLANT
NS IRRIG 1000ML POUR BTL (IV SOLUTION) ×3 IMPLANT
PACK ORTHO EXTREMITY (CUSTOM PROCEDURE TRAY) ×3 IMPLANT
PAD ARMBOARD 7.5X6 YLW CONV (MISCELLANEOUS) ×6 IMPLANT
SET MONITOR QUICK PRESSURE (MISCELLANEOUS) ×3 IMPLANT
SPONGE T-LAP 18X18 ~~LOC~~+RFID (SPONGE) ×3 IMPLANT
STOCKINETTE IMPERVIOUS 9X36 MD (GAUZE/BANDAGES/DRESSINGS) ×3 IMPLANT
TOWEL GREEN STERILE (TOWEL DISPOSABLE) ×3 IMPLANT
TUBE CONNECTING 12X1/4 (SUCTIONS) ×3 IMPLANT
UNDERPAD 30X36 HEAVY ABSORB (UNDERPADS AND DIAPERS) ×3 IMPLANT
YANKAUER SUCT BULB TIP NO VENT (SUCTIONS) ×3 IMPLANT

## 2021-01-24 NOTE — ED Notes (Signed)
Pt swabbed for covid 15 minute rapid antigen test. Sent to ED minilab. Belongings bagged and labeled. Glasses and hearing aids remain on/ with pt.

## 2021-01-24 NOTE — ED Notes (Signed)
Patient prefer to seat in a wheelchair.  Staff from [place] came and stated that pt needs to be checked.    While patient was seating on the wheelchair, he becomes unresponsive and urinated himself.  EDP called to bedside.  Patient was transferred to bed with several staff's assistance.  Patient suddenly wake up and responsive.

## 2021-01-24 NOTE — Plan of Care (Signed)

## 2021-01-24 NOTE — Op Note (Signed)
Date of Surgery: 01/24/2021  INDICATIONS: [name] is a 75 y.o.-year-old male with with right leg compartment syndrome after slamming it in a door at 730 this morning.  Of note, he is on an antiplatelet medication with an evolving hematoma that has been worked up at the [facility] facility.  Emergent transfer was obtained on concern for compartment syndrome.  The risk and benefits of the procedure with discussed in detail and documented in the pre-operative evaluation.  PREOPERATIVE DIAGNOSIS: Right lower leg compartment syndrome  POSTOPERATIVE DIAGNOSIS: Same.  PROCEDURE: 1.  Right lower leg 4 compartment fasciotomy 2.  Stryker pressure measurements of right anterior, lateral, superficial posterior, deep posterior compartments 3.  Application of deep wound VAC wound greater than 50 cm  SURGEON: Yevonne Pax MD  ANESTHESIA:  general  IV FLUIDS AND URINE: See anesthesia record.  ANTIBIOTICS: Ancef 2 g  ESTIMATED BLOOD LOSS: 50 mL.  IMPLANTS:  * No implants in log *  DRAINS: None  CULTURES: None  COMPLICATIONS: none  DESCRIPTION OF PROCEDURE:  The patient was identified in the preoperative holding area.  A blood pressure was obtained prior to any type of sedation which was 132/80 mmHg.  A universal protocol was performed and the correct site was marked.  The patient was subsequently taken back to the operating room and anesthesia was induced.  Stryker pressure measurements were obtained with the following values: 60 anterior, 40 lateral, 30 deep posterior, 27 posterior superficial which were consistent with the diagnosis of compartment syndrome.  These measurements were obtained using sterile gloves after prepping the leg sterilely.  The patient was then prepped and draped in the usual sterile fashion.  We began with our anterior lateral incision which was centered in between the tibial crest and fibula.  The incision was made from approximately 3 cm distal to the tubercle  down to the distal third tibia.  Care was taken to identify and protect the peroneal nerve.  A nick was made in the anterior compartment using 15 blade.  We then used Metzenbaum scissors to carry the dissection proximally and distally.  This resulted in bulging muscle from the anterior compartment.  This was all healthy appearing this was all bleeding and contractile with electrocautery.  We then performed the same procedure with the lateral compartment again initially with 15 blade and then Metzenbaum scissors proximally and distally.  Care was taken to protect the peroneal nerve.  Attention was then turned to the medial incision.  An incision was made 2 cm off the posterior aspect of the medial tibial crest.  The saphenous vein was encountered and protected.  The fascia off the posterior medial tibia was released utilizing a 15 blade and again proximal and distal extent was performed using Metzenbaum scissors.  The fascia off the posterior medial tibial to the deep compartment was then again released using Metzenbaum scissors.  The muscle of the posterior superficial and deep superficial overall contractile as well as bleeding.  All muscle appeared viable.  The wound was thoroughly irrigated, and wound vacuum was applied over both incisions with staples.  A Kerlix dressing was wrapped loosely around this.  All counts were correct at the end of the case.  The patient was awoken and taken to the PACU without complication.     POSTOPERATIVE PLAN: The patient will require a return to the operating room for the right leg for second look and possible wound closure in 2 to 3 days.  We did discuss the  possibility of requiring a skin graft ultimately if he is too swollen for primary wound closure.  Yevonne Pax, MD 5:45 PM

## 2021-01-24 NOTE — Anesthesia Procedure Notes (Signed)
Procedure Name: Intubation Date/Time: 01/24/2021 4:56 PM Performed by: Suzy Bouchard, CRNA Pre-anesthesia Checklist: Patient identified, Emergency Drugs available, Suction available, Patient being monitored and Timeout performed Patient Re-evaluated:Patient Re-evaluated prior to induction Oxygen Delivery Method: Circle system utilized Preoxygenation: Pre-oxygenation with 100% oxygen Induction Type: IV induction and Rapid sequence Laryngoscope Size: Glidescope and 3 Grade View: Grade I Tube type: Oral Tube size: 7.5 mm Number of attempts: 1 Airway Equipment and Method: Stylet and Video-laryngoscopy Placement Confirmation: ETT inserted through vocal cords under direct vision, positive ETCO2 and breath sounds checked- equal and bilateral Secured at: 23 cm Tube secured with: Tape Dental Injury: Teeth and Oropharynx as per pre-operative assessment

## 2021-01-24 NOTE — ED Provider Notes (Signed)
Daughter Syaire Saber updated by phone.   Wyvonnia Dusky, MD 01/24/21 1540

## 2021-01-24 NOTE — Anesthesia Preprocedure Evaluation (Addendum)
Anesthesia Evaluation  Patient identified by MRN, date of birth, ID band Patient awake    Reviewed: Allergy & Precautions, NPO status , Patient's Chart, lab work & pertinent test results, reviewed documented beta blocker date and time   History of Anesthesia Complications Negative for: history of anesthetic complications  Airway Mallampati: III  TM Distance: >3 FB Neck ROM: Full    Dental  (+) Dental Advisory Given, Teeth Intact   Pulmonary former smoker,    Pulmonary exam normal        Cardiovascular hypertension, Pt. on home beta blockers and Pt. on medications pulmonary hypertension+ CAD, + Past MI and + Cardiac Stents  Normal cardiovascular exam   '21 TTE - EF 55 to 60%. Mild left ventricular hypertrophy.  Grade I diastolic dysfunction (impaired relaxation). Trivial mitral valve regurgitation.  Previous echo with RVSP 18mHg suggesting moderate pulm HTN    Neuro/Psych negative neurological ROS  negative psych ROS   GI/Hepatic negative GI ROS, Neg liver ROS,   Endo/Other  Morbid obesity  Renal/GU Renal InsufficiencyRenal disease     Musculoskeletal negative musculoskeletal ROS (+)   Abdominal   Peds  Hematology  On brilinta    Anesthesia Other Findings Compartment syndrome   Reproductive/Obstetrics                            Anesthesia Physical Anesthesia Plan  ASA: 3 and emergent  Anesthesia Plan: General   Post-op Pain Management:    Induction: Intravenous  PONV Risk Score and Plan: 2 and Treatment may vary due to age or medical condition, Ondansetron and Dexamethasone  Airway Management Planned: Oral ETT  Additional Equipment: None  Intra-op Plan:   Post-operative Plan: Extubation in OR  Informed Consent: I have reviewed the patients History and Physical, chart, labs and discussed the procedure including the risks, benefits and alternatives for the proposed  anesthesia with the patient or authorized representative who has indicated his/her understanding and acceptance.     Dental advisory given  Plan Discussed with: CRNA and Anesthesiologist  Anesthesia Plan Comments:        Anesthesia Quick Evaluation

## 2021-01-24 NOTE — Brief Op Note (Signed)
   Brief Op Note  Date of Surgery: 01/24/2021  Preoperative Diagnosis: Compartment Syndrome  Postoperative Diagnosis: same  Procedure: Procedure(s): APPLICATION OF WOUND VAC FASCIOTOMY RIGHT LEG  Implants: * No implants in log *  Surgeons: Surgeon(s): [name], MD  Anesthesia: General    Estimated Blood Loss: See anesthesia record  Complications: None  Condition to PACU: Stable  Yevonne Pax, MD 01/24/2021 5:55 PM

## 2021-01-24 NOTE — Transfer of Care (Signed)
Immediate Anesthesia Transfer of Care Note  Patient: [name]  Procedure(s) Performed: APPLICATION OF WOUND VAC (Right: Leg Lower) FASCIOTOMY RIGHT LEG (Right: Leg Lower)  Patient Location: PACU  Anesthesia Type:General  Level of Consciousness: drowsy and responds to stimulation  Airway & Oxygen Therapy: Patient Spontanous Breathing and Patient connected to nasal cannula oxygen  Post-op Assessment: Report given to RN and Post -op Vital signs reviewed and stable  Post vital signs: Reviewed and stable  Last Vitals:  Vitals Value Taken Time  BP 147/90 01/24/21 1754  Temp    Pulse 98 01/24/21 1757  Resp 18 01/24/21 1757  SpO2 99 % 01/24/21 1757  Vitals shown include unvalidated device data.  Last Pain:  Vitals:   01/24/21 1625  TempSrc:   PainSc: 10-Worst pain ever         Complications: No notable events documented.

## 2021-01-24 NOTE — H&P (Signed)
History and Physical    Gal Feldhaus PTW:[id number] DOB: Apr 01, 1946 DOA: 01/24/2021  PCP: [name], MD (Confirm with patient/family/NH records and if not entered, this has to be entered at [facility] point of entry) Patient coming from: Home  I have personally briefly reviewed patient's old medical records in [place]  Chief Complaint: Post-op surgical pain  HPI: [name] is a 75 y.o. 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  This morning, patient accidentally slammed the car door to his right calf. Then developed worsening of right calf pain, swelling and hard to stand right leg.  As patient remembers, while in the [place] ED, he was starting to feel decreased right foot sensation and hard to move due to severe pain, while he was trying to reposition himself to make the right leg more comfortable while sitting in a wheelchair, he was trying to sit up and then started to feel lightheaded and then he lost his consciousness and slumped backward.  ED staff noticed patient had "arm shaking" movement and urinary incontinence. Then patient suddenly waking up when ED staff was trying to move him from the wheelchair to the bed. No meds given and no post ictal confusion. No chest pain or SOB.  Review of Systems: As per HPI otherwise 14 point review of systems negative.    Past Medical History:  Diagnosis Date   Cancer [facility])    CHF (congestive heart failure) (HCC)    EF 45-50% on 08/2019 echo   Chronic kidney disease    RENAL STONES   Coronary artery disease    3 vessel disease: DES to RCA, DES to LCx 08/2019 and medical management   High frequency hearing loss    History of migraine headaches    Hx of colonic polyps    Hyperlipidemia    Hypertension    Obesity    Pulmonary hypertension ([place])     Seborrheic dermatitis    Vitiligo     Past Surgical History:  Procedure Laterality Date   COLON SURGERY  01/30/2001   CORONARY STENT INTERVENTION N/A 08/12/2019   Procedure: CORONARY STENT INTERVENTION;  Surgeon: Nelva Bush, MD;  Location: [facility];  Service: Cardiovascular;  Laterality: N/A;   CYSTOSCOPY/URETEROSCOPY/HOLMIUM LASER/STENT PLACEMENT Left 02/04/2019   Procedure: CYSTOSCOPY Left retrograde and attempted left ureteroscopy;  Surgeon: Raynelle Bring, MD;  Location: [facility];  Service: Urology;  Laterality: Left;  ONLY NEEDS 60 MIN   CYSTOSCOPY/URETEROSCOPY/HOLMIUM LASER/STENT PLACEMENT Left 11/07/2019   Procedure: CYSTOSCOPY/URETEROSCOPY/HOLMIUM LASER/STENT PLACEMENT;  Surgeon: Raynelle Bring, MD;  Location: [facility];  Service: Urology;  Laterality: Left;   FRACTURE SURGERY     INTRAVASCULAR ULTRASOUND/IVUS N/A 08/12/2019   Procedure: Intravascular Ultrasound/IVUS;  Surgeon: Nelva Bush, MD;  Location: [facility];  Service: Cardiovascular;  Laterality: N/A;   IR CONVERT LEFT NEPHROSTOMY TO NEPHROURETERAL CATH  09/17/2019   IR NEPHROSTOMY PLACEMENT LEFT  02/05/2019   IR NEPHROSTOMY PLACEMENT LEFT  08/06/2019   LEFT HEART CATH AND CORONARY ANGIOGRAPHY N/A 08/09/2019   Procedure: LEFT HEART CATH AND CORONARY ANGIOGRAPHY;  Surgeon: Jettie Booze, MD;  Location: [facility];  Service: Cardiovascular;  Laterality: N/A;   SKIN BIOPSY     TONSILLECTOMY AND ADENOIDECTOMY       reports that he quit smoking about 25  years ago. His smoking use included cigarettes. He has a 40.00 pack-year smoking history. He has never used smokeless tobacco. He reports that he does not currently use alcohol. He reports that he does not use drugs.  Allergies  Allergen Reactions   Codeine     Bad Headache    History reviewed. No pertinent family history.   Prior to Admission medications   Medication Sig Start Date End Date Taking? Authorizing Provider  aspirin  EC 81 MG EC tablet Take 1 tablet (81 mg total) by mouth daily. 08/14/19  Yes Nita Sells, MD  ezetimibe (ZETIA) 10 MG tablet TAKE 1 TABLET(10 MG) BY MOUTH DAILY 01/18/21  Yes Turner, Eber Hong, MD  rosuvastatin (CRESTOR) 5 MG tablet Take 1 tablet (5 mg total) by mouth 2 (two) times a week. 05/11/20  Yes Turner, Eber Hong, MD  ticagrelor (BRILINTA) 60 MG TABS tablet Take 1 tablet (60 mg total) by mouth 2 (two) times daily. 08/07/20  Yes Turner, Eber Hong, MD  lisinopril (ZESTRIL) 5 MG tablet Take 1 tablet (5 mg total) by mouth daily. 08/19/19   Burtis Junes, NP  metoprolol succinate (TOPROL-XL) 50 MG 24 hr tablet Take 1 tablet (50 mg total) by mouth daily. Take with or immediately following a meal. 08/19/19   Burtis Junes, NP  nitroGLYCERIN (NITROSTAT) 0.4 MG SL tablet Place 1 tablet (0.4 mg total) under the tongue every 5 (five) minutes as needed for chest pain. 08/19/19 11/17/19  Burtis Junes, NP    Physical Exam: Vitals:   01/24/21 1824 01/24/21 1839 01/24/21 1854 01/24/21 1924  BP: 127/72 122/78 121/82 136/75  Pulse: 75 70 64 70  Resp: 18 11 12 14   Temp: 98.7 F (37.1 C)     TempSrc:      SpO2: 98% 96% 94% 98%  Weight:      Height:        Constitutional: NAD, calm, comfortable Vitals:   01/24/21 1824 01/24/21 1839 01/24/21 1854 01/24/21 1924  BP: 127/72 122/78 121/82 136/75  Pulse: 75 70 64 70  Resp: 18 11 12 14   Temp: 98.7 F (37.1 C)     TempSrc:      SpO2: 98% 96% 94% 98%  Weight:      Height:       Eyes: PERRL, lids and conjunctivae normal ENMT: Mucous membranes are moist. Posterior pharynx clear of any exudate or lesions.Normal dentition.  Neck: normal, supple, no masses, no thyromegaly Respiratory: clear to auscultation bilaterally, no wheezing, no crackles. Normal respiratory effort. No accessory muscle use.  Cardiovascular: Regular rate and rhythm, no murmurs / rubs / gallops. No extremity edema. 2+ pedal pulses. No carotid bruits.  Abdomen: no tenderness, no  masses palpated. No hepatosplenomegaly. Bowel sounds positive.  Musculoskeletal: no clubbing / cyanosis. No joint deformity upper and lower extremities. Good ROM, no contractures. Normal muscle tone. Right distal leg in wound vac. Skin: no rashes, lesions, ulcers. No induration Neurologic: CN 2-12 grossly intact. Sensation intact, DTR normal. Strength 5/5 in all 4.  Psychiatric: Normal judgment and insight. Alert and oriented x 3. Normal mood.     Labs on Admission: I have personally reviewed following labs and imaging studies  CBC: Recent Labs  Lab 01/24/21 1242  WBC 9.1  NEUTROABS 6.8  HGB 14.8  HCT 44.2  MCV 85.5  PLT 354   Basic Metabolic Panel: Recent Labs  Lab 01/24/21 1242  NA 139  K 4.0  CL 106  CO2 24  GLUCOSE 139*  BUN 19  CREATININE 1.36*  CALCIUM 9.1   GFR: Estimated Creatinine Clearance: 57.3 mL/min (A) (by C-G formula based on SCr of 1.36 mg/dL (H)). Liver Function Tests: No results for input(s): AST, ALT, ALKPHOS, BILITOT, PROT, ALBUMIN in the last 168 hours. No results for input(s): LIPASE, AMYLASE in the last 168 hours. No results for input(s): AMMONIA in the last 168 hours. Coagulation Profile: No results for input(s): INR, PROTIME in the last 168 hours. Cardiac Enzymes: Recent Labs  Lab 01/24/21 1242  CKTOTAL 77   BNP (last 3 results) No results for input(s): PROBNP in the last 8760 hours. HbA1C: No results for input(s): HGBA1C in the last 72 hours. CBG: No results for input(s): GLUCAP in the last 168 hours. Lipid Profile: No results for input(s): CHOL, HDL, LDLCALC, TRIG, CHOLHDL, LDLDIRECT in the last 72 hours. Thyroid Function Tests: No results for input(s): TSH, T4TOTAL, FREET4, T3FREE, THYROIDAB in the last 72 hours. Anemia Panel: No results for input(s): VITAMINB12, FOLATE, FERRITIN, TIBC, IRON, RETICCTPCT in the last 72 hours. Urine analysis:    Component Value Date/Time   COLORURINE AMBER (A) 08/04/2019 1702   APPEARANCEUR  CLOUDY (A) 08/04/2019 1702   LABSPEC 1.017 08/04/2019 1702   PHURINE 6.0 08/04/2019 1702   GLUCOSEU NEGATIVE 08/04/2019 1702   HGBUR SMALL (A) 08/04/2019 1702   BILIRUBINUR NEGATIVE 08/04/2019 [place] 08/04/2019 1702   PROTEINUR 30 (A) 08/04/2019 1702   NITRITE NEGATIVE 08/04/2019 1702   LEUKOCYTESUR LARGE (A) 08/04/2019 1702    Radiological Exams on Admission: CT HEAD WO CONTRAST (5MM)  Result Date: 01/24/2021 CLINICAL DATA:  Seizure.  Leg injury. EXAM: CT HEAD WITHOUT CONTRAST TECHNIQUE: Contiguous axial images were obtained from the base of the skull through the vertex without intravenous contrast. COMPARISON:  None. FINDINGS: Brain: No evidence of acute infarction, hemorrhage, hydrocephalus, extra-axial collection or mass lesion/mass effect. Ventricular and sulcal enlargement reflecting mild to moderate diffuse atrophy. Patchy areas of white matter hypoattenuation are noted consistent with mild chronic microvascular ischemic change. Vascular: No hyperdense vessel or unexpected calcification. Skull: Normal. Negative for fracture or focal lesion. Sinuses/Orbits: Globes and orbits are unremarkable. Visualized sinuses are clear. Other: None. IMPRESSION: 1. No acute intracranial abnormalities. Electronically Signed   By: Lajean Manes M.D.   On: 01/24/2021 15:18   [place] RT LOWER EXTREM LTD SOFT TISSUE NON VASCULAR  Result Date: 01/24/2021 CLINICAL DATA:  Right leg injury.  Hit car door this morning. EXAM: ULTRASOUND RIGHT LOWER EXTREMITY LIMITED TECHNIQUE: Ultrasound examination of the lower extremity soft tissues was performed in the area of clinical concern. COMPARISON:  None. FINDINGS: Complex collection in the right posterolateral calf, with no internal blood flow on color Doppler analysis, measuring approximately 16 x 5 x 5 cm, consistent with a hematoma. IMPRESSION: 1. Complex collection in the posterolateral right calf consistent with a hematoma, given the history of trauma.  Electronically Signed   By: Lajean Manes M.D.   On: 01/24/2021 14:23   CT EXTREMITY LOWER RIGHT W CONTRAST  Result Date: 01/24/2021 CLINICAL DATA:  Right lower extremity pain and swelling after closed leg in car door. Patient on anticoagulation. EXAM: CT OF THE LOWER RIGHT EXTREMITY WITH CONTRAST TECHNIQUE: Multidetector CT imaging of the lower right extremity was performed according to the standard protocol following intravenous contrast administration. CONTRAST:  36mL OMNIPAQUE IOHEXOL 350 MG/ML SOLN COMPARISON:  Same-day soft tissue ultrasound FINDINGS: Bones/Joint/Cartilage No acute fracture of the right tibia or fibula. Normal bony alignment  without dislocation. No significant arthropathy of the right knee or ankle. No periostitis. No lytic or sclerotic bone lesion. Ligaments Suboptimally assessed by CT. Muscles and Tendons No acute musculotendinous abnormality by CT. No intramuscular fluid collection. The extensor mechanism of the knee is intact. Soft tissues Very large heterogeneously hyperdense fluid collection overlies the posterior compartment of the right calf measuring approximally 26 x 4 x 11 cm. No rim enhancement. There is surrounding stranding and edema within the adjacent soft tissues. Multifocal areas of cutaneous blistering. No soft tissue gas. IMPRESSION: 1. Very large heterogeneously hyperdense fluid collection overlying the posterior compartment of the right calf measuring approximally 26 x 4 x 11 cm compatible with a large hematoma. Multifocal areas of cutaneous blistering, which can be seen in the setting of compartment syndrome. 2. No acute fracture or dislocation of the right tibia or fibula. These results were called by telephone at the time of interpretation on 01/24/2021 at 3:27 pm to provider MATTHEW TRIFAN , who verbally acknowledged these results. Electronically Signed   By: Davina Poke D.O.   On: 01/24/2021 15:35    EKG: Independently reviewed. Sinus, no acute ST  changes.  Assessment/Plan Active Problems:   Compartment syndrome ([place])  (please populate well all problems here in Problem List. (For example, if patient is on BP meds at home and you resume or decide to hold them, it is a problem that needs to be her. Same for CAD, COPD, HLD and so on)  Right lower extremity compartment syndrome -Secondary to dull trauma -S/P right lower leg 4 compartment fasciotomy and wound vac. And surgeon plans to take the patient back to OR in 2-3 days for wound closure. -Pain control -Discussed with surgeon Dr. [name], ok with chemical DVT prophylaxis, recommend hold Brilinta for now.  Syncope -Likely secondary to pain -Patient reported that 2 years ago had asymptomatic heart attack during urology procedure and he had a similar syncope.  Renal patient does not have chest pain, but given strong history of asymptomatic MI, will order postop EKG and troponin. -Low suspicion for seizure.  CAD with stenting -Post op EKG and trop as discussed above.  HTN -Blood pressure stable, restart BP meds tomorrow  CKD stage II -Euvolemic, creatinine level stable, plans to restart ACEI tomorrow.  Repeat BMP in the morning.  DVT prophylaxis: Lovenox Code Status: Full code Family Communication: None at bedside Disposition Plan: Expect more than 2 midnight hospital stay, patient will need a second surgery. Consults called: Orthopedic surgery Admission status: Tele admit   Lequita Halt MD [facility] Pager [phone]  01/24/2021, 7:29 PM

## 2021-01-24 NOTE — Anesthesia Postprocedure Evaluation (Signed)
Anesthesia Post Note  Patient: [name]  Procedure(s) Performed: APPLICATION OF WOUND VAC (Right: Leg Lower) FASCIOTOMY RIGHT LEG (Right: Leg Lower)     Patient location during evaluation: PACU Anesthesia Type: General Level of consciousness: awake and alert Pain management: pain level controlled Vital Signs Assessment: post-procedure vital signs reviewed and stable Respiratory status: spontaneous breathing, nonlabored ventilation, respiratory function stable and patient connected to nasal cannula oxygen Cardiovascular status: blood pressure returned to baseline and stable Postop Assessment: no apparent nausea or vomiting Anesthetic complications: no   No notable events documented.  Last Vitals:  Vitals:   01/24/21 1839 01/24/21 1854  BP: 122/78 121/82  Pulse: 70 64  Resp: 11 12  Temp:    SpO2: 96% 94%    Last Pain:  Vitals:   01/24/21 1824  TempSrc:   PainSc: [place]

## 2021-01-24 NOTE — ED Provider Notes (Signed)
[facility] DEPT Provider Note   CSN: [id number] Arrival date & time: 01/24/21  1211     History Chief Complaint  Patient presents with   Leg Injury   Leg Pain    [name] is a 75 y.o. male present emerged department with right leg pain.  The patient is on Brilinta.  He reports he accidentally shot a car door against his right leg earlier today.  He has been having worsening pain in the leg and has difficulty standing on it.  He feels that the leg is tense and painful.  He denies any other injuries.  He normally walks at baseline with a limp but does not use any other assistance.  He is having difficulty bearing weight now.  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   Myalgia due to statin 03/12/2020   Coronary artery disease due to lipid rich plaque    Non-ST elevation (NSTEMI) myocardial infarction [facility])    Hypertensive emergency    Elevated troponin    Leg edema    Nephrolithiasis 08/04/2019   AKI (acute kidney injury) ([place]) 08/04/2019   Hypokalemia 08/04/2019   infected Renal calculus, left 02/04/2019    Past Surgical History:  Procedure Laterality Date   COLON SURGERY  01/30/2001   CORONARY STENT INTERVENTION N/A 08/12/2019   Procedure: CORONARY STENT INTERVENTION;  Surgeon: Nelva Bush, MD;  Location: [facility];  Service: Cardiovascular;  Laterality: N/A;   CYSTOSCOPY/URETEROSCOPY/HOLMIUM  LASER/STENT PLACEMENT Left 02/04/2019   Procedure: CYSTOSCOPY Left retrograde and attempted left ureteroscopy;  Surgeon: Raynelle Bring, MD;  Location: [facility];  Service: Urology;  Laterality: Left;  ONLY NEEDS 60 MIN   CYSTOSCOPY/URETEROSCOPY/HOLMIUM LASER/STENT PLACEMENT Left 11/07/2019   Procedure: CYSTOSCOPY/URETEROSCOPY/HOLMIUM LASER/STENT PLACEMENT;  Surgeon: Raynelle Bring, MD;  Location: [facility];  Service: Urology;  Laterality: Left;   FRACTURE SURGERY     INTRAVASCULAR ULTRASOUND/IVUS N/A 08/12/2019   Procedure: Intravascular Ultrasound/IVUS;  Surgeon: Nelva Bush, MD;  Location: [facility] [facility];  Service: Cardiovascular;  Laterality: N/A;   IR CONVERT LEFT NEPHROSTOMY TO NEPHROURETERAL CATH  09/17/2019   IR NEPHROSTOMY PLACEMENT LEFT  02/05/2019   IR NEPHROSTOMY PLACEMENT LEFT  08/06/2019   LEFT HEART CATH AND CORONARY ANGIOGRAPHY N/A 08/09/2019   Procedure: LEFT HEART CATH AND CORONARY ANGIOGRAPHY;  Surgeon: Jettie Booze, MD;  Location: [facility];  Service: Cardiovascular;  Laterality: N/A;   SKIN BIOPSY     TONSILLECTOMY AND ADENOIDECTOMY         No family history on file.  Social History   Tobacco Use   Smoking status: Former    Packs/day: 1.00    Years: 40.00    Pack years: 40.00    Types: Cigarettes    Quit date: 1997  Years since quitting: 25.7   Smokeless tobacco: Never  Vaping Use   Vaping Use: Never used  Substance Use Topics   Alcohol use: Not Currently   Drug use: Never    Home Medications Prior to Admission medications   Medication Sig Start Date End Date Taking? Authorizing Provider  aspirin EC 81 MG EC tablet Take 1 tablet (81 mg total) by mouth daily. 08/14/19   Nita Sells, MD  ezetimibe (ZETIA) 10 MG tablet TAKE 1 TABLET(10 MG) BY MOUTH DAILY 01/18/21   Sueanne Margarita, MD  lisinopril (ZESTRIL) 5 MG tablet Take 1 tablet (5 mg total) by mouth daily. 08/19/19   Burtis Junes, NP  metoprolol succinate (TOPROL-XL) 50  MG 24 hr tablet Take 1 tablet (50 mg total) by mouth daily. Take with or immediately following a meal. 08/19/19   Burtis Junes, NP  nitroGLYCERIN (NITROSTAT) 0.4 MG SL tablet Place 1 tablet (0.4 mg total) under the tongue every 5 (five) minutes as needed for chest pain. 08/19/19 11/17/19  Burtis Junes, NP  rosuvastatin (CRESTOR) 5 MG tablet Take 1 tablet (5 mg total) by mouth 2 (two) times a week. 05/11/20   Sueanne Margarita, MD  ticagrelor (BRILINTA) 60 MG TABS tablet Take 1 tablet (60 mg total) by mouth 2 (two) times daily. 08/07/20   Sueanne Margarita, MD    Allergies    Codeine  Review of Systems   Review of Systems  Constitutional:  Negative for chills and fever.  Eyes:  Negative for photophobia and visual disturbance.  Respiratory:  Negative for cough and shortness of breath.   Cardiovascular:  Negative for chest pain and palpitations.  Gastrointestinal:  Negative for abdominal pain and vomiting.  Genitourinary:  Negative for dysuria and hematuria.  Musculoskeletal:  Positive for arthralgias and myalgias.  Skin:  Negative for color change and rash.  Neurological:  Negative for weakness, numbness and headaches.  All other systems reviewed and are negative.  Physical Exam Updated Vital Signs BP 122/68 (BP Location: Right Arm)   Pulse 62   Temp 97.7 F (36.5 C) (Oral)   Resp 17   Ht 5\' 8"  (1.727 m)   Wt 119.7 kg   SpO2 100%   BMI 40.14 kg/m   Physical Exam Constitutional:      General: He is not in acute distress. HENT:     Head: Normocephalic and atraumatic.  Eyes:     Conjunctiva/sclera: Conjunctivae normal.     Pupils: Pupils are equal, round, and reactive to light.  Cardiovascular:     Rate and Rhythm: Normal rate and regular rhythm.     Pulses: Normal pulses.  Pulmonary:     Effort: Pulmonary effort is normal. No respiratory distress.  Abdominal:     General: There is no distension.     Tenderness: There is no abdominal tenderness.  Musculoskeletal:      Comments: Tenseness and swelling of right lower leg compartment, no visible ecchymosis  Skin:    General: Skin is warm and dry.  Neurological:     General: No focal deficit present.     Mental Status: He is alert. Mental status is at baseline.  Psychiatric:        Mood and Affect: Mood normal.        Behavior: Behavior normal.    ED Results / Procedures / Treatments   Labs (all labs ordered are listed, but only abnormal results are displayed) Labs Reviewed  BASIC METABOLIC PANEL -  Abnormal; Notable for the following components:      Result Value   Glucose, Bld 139 (*)    Creatinine, Ser 1.36 (*)    GFR, Estimated 54 (*)    All other components within normal limits  CBC WITH DIFFERENTIAL/PLATELET - Abnormal; Notable for the following components:   Monocytes Absolute 1.1 (*)    All other components within normal limits  CK  MAGNESIUM  CBG MONITORING, ED  TROPONIN I (HIGH SENSITIVITY)    EKG EKG Interpretation  Date/Time:  Sunday January 24 2021 13:46:22 EDT Ventricular Rate:  62 PR Interval:  145 QRS Duration: 105 QT Interval:  412 QTC Calculation: 419 R Axis:   63 Text Interpretation: Sinus rhythm Confirmed by Octaviano Glow [phone]) on 01/24/2021 3:20:16 PM  Radiology CT HEAD WO CONTRAST (5MM)  Result Date: 01/24/2021 CLINICAL DATA:  Seizure.  Leg injury. EXAM: CT HEAD WITHOUT CONTRAST TECHNIQUE: Contiguous axial images were obtained from the base of the skull through the vertex without intravenous contrast. COMPARISON:  None. FINDINGS: Brain: No evidence of acute infarction, hemorrhage, hydrocephalus, extra-axial collection or mass lesion/mass effect. Ventricular and sulcal enlargement reflecting mild to moderate diffuse atrophy. Patchy areas of white matter hypoattenuation are noted consistent with mild chronic microvascular ischemic change. Vascular: No hyperdense vessel or unexpected calcification. Skull: Normal. Negative for fracture or focal lesion. Sinuses/Orbits:  Globes and orbits are unremarkable. Visualized sinuses are clear. Other: None. IMPRESSION: 1. No acute intracranial abnormalities. Electronically Signed   By: Lajean Manes M.D.   On: 01/24/2021 15:18   [place] RT LOWER EXTREM LTD SOFT TISSUE NON VASCULAR  Result Date: 01/24/2021 CLINICAL DATA:  Right leg injury.  Hit car door this morning. EXAM: ULTRASOUND RIGHT LOWER EXTREMITY LIMITED TECHNIQUE: Ultrasound examination of the lower extremity soft tissues was performed in the area of clinical concern. COMPARISON:  None. FINDINGS: Complex collection in the right posterolateral calf, with no internal blood flow on color Doppler analysis, measuring approximately 16 x 5 x 5 cm, consistent with a hematoma. IMPRESSION: 1. Complex collection in the posterolateral right calf consistent with a hematoma, given the history of trauma. Electronically Signed   By: Lajean Manes M.D.   On: 01/24/2021 14:23    Procedures .Critical Care Performed by: Wyvonnia Dusky, MD Authorized by: Wyvonnia Dusky, MD   Critical care provider statement:    Critical care time (minutes):  45   Critical care was necessary to treat or prevent imminent or life-threatening deterioration of the following conditions:  Trauma   Critical care was time spent personally by me on the following activities:  Discussions with consultants, evaluation of patient's response to treatment, examination of patient, ordering and performing treatments and interventions, ordering and review of laboratory studies, ordering and review of radiographic studies, pulse oximetry, re-evaluation of patient's condition, obtaining history from patient or surrogate and review of old charts   Medications Ordered in ED Medications  sodium chloride 0.9 % bolus 500 mL (has no administration in time range)  iohexol (OMNIPAQUE) 350 MG/ML injection 60 mL (60 mLs Intravenous Contrast Given 01/24/21 1435)    ED Course  I have reviewed the triage vital signs and the  nursing notes.  Pertinent labs & imaging results that were available during my care of the patient were reviewed by me and considered in my medical decision making (see chart for details).  Patient seen in the ER and evaluated for right leg pain and swelling after traumatic injury, shutting his leg in the car  door.  On arrival he does have tense right lower extremity leg compartments.  There is blistering and bullae that is developed around this leg.  There is some mild white discoloration of the entire leg.  He does describe significant pain in his entire leg.  Distal right foot is warm, no palpable pulse but there is a dopplerable pulse.  Clinically this was concerning for compartment syndrome.  I ordered initially an ultrasound, which visualized a hematoma but only partially.  Subsequently the patient had an episode of possible syncope versus seizure.  He reported he felt lightheaded, then became unresponsive in the room, with rhythmic head-bobbing, lasting approximately 1 to 2 minutes.  He was breathing heavily during this.  He was transferred to the bed, noted to have urinated on the floor, and had gradual recovery of his mental function within approximately 5 minutes.  He denies any prior history of seizures, but does report a history of syncope in the past, many years ago, related to stress and pain.  He underwent a CT scan of the brain which is unremarkable.  He also had a CT scan of the right lower extremity, which confirms hematoma near the posterior compartment, but per my immediate read did not show any obvious fracture of the right lower extremity (final rads report pending).  I personally reviewed his EKG which shows sinus rhythm no acute ischemic findings.  The patient had no chest pain during his stay in the ED, no chest pressure, to suggest ACS or pulmonary embolism.  His basic chemistries including CK level were unremarkable.  He has pending a troponin level.  I consulted orthopedics at  both [facility] and [facility].  It was ultimately decided the patient would be better suited with medical admission at [facility], given the possibility of new onset seizure.  Dr Zachery Dakins agreed to evaluate patient on arrival at [facility] and should be contacted upon patient's ED arrival.  Dr [name] [facility]) covering [facility] was also contacted and available as backup, if necessary.   Clinical Course as of 01/24/21 1537  Sun Jan 24, 2021  1332 Radiologist recommending RL soft tissue ultrasound as equivalent option to look for soft tissue hematoma - U/S ordered instead [MT]  1348 Patient under damage.  Consistent with a seizure.  He became unresponsive and had clonic jerking of his chest and arms.  This lasted approximately 2 minutes.  He urinated on himself.  He was helped into a bed at which point he was mumbling and then awake and able to answer questions. [MT]  1610 Paged ortho - left voicemail with Dr [name] [MT]  [phone] I spoke to Dr [name] from [facility] who advised stat transfer ED to ED to [facility] for ortho evaluation for compartment syndrome. [MT]  1500 Pt accepted for ED to ED evaluation by Dr Rogene Houston at [facility] [MT]  1524 Admitted to Dr Roosevelt Locks hospitalist - to be contacted upon patient arrival at [facility] ED.  Bed request to be placed by Dr Roosevelt Locks.  Orthopedist Dr Zachery Dakins to be contacted on arrival at [facility]. [MT]    Clinical Course User Index [MT] Trong Gosling, Carola Rhine, MD    Final Clinical Impression(s) / ED Diagnoses Final diagnoses:  Hematoma    Rx / DC Orders ED Discharge Orders     None        Wyvonnia Dusky, MD 01/24/21 1537

## 2021-01-24 NOTE — ED Triage Notes (Addendum)
Pt started to get in car around 7 this morning, car door hit back of rt leg, no skin injury, tender to touch and painful.Pt can not walk on leg, Pt is on Blood Thinner. Swelling noted to lower leg.

## 2021-01-24 NOTE — ED Notes (Addendum)
Arrives to exam room by [facility], [facility] reports no active pain at this time, Ortho at [facility] on arrival. Dr. [name] discussing fasciotomy emergently in OR w/in 30 minutes. States "does not take metoprol/toprol". Last ate 0745. Pt c/o 10/10 pain. No meds given PTA.

## 2021-01-24 NOTE — Consult Note (Signed)
ORTHOPAEDIC CONSULTATION  REQUESTING PHYSICIAN: Lequita Halt, MD  Chief Complaint: Right leg pain, question of compartment syndrome  HPI: [name] is a 75 y.o. male who presents status post hitting his right leg in a car door at approximately 730 this morning.  He states that he did not initially have pain although developed pain in the ensuing hours.  He proceeded to the [facility] and subsequently was evaluated.  CT scan showed a large hematoma in the posterior gastrocs.  He is subsequently consulted me to discuss possible compartment syndrome.  I requested emergent transfer to [facility] for further evaluation.  I saw the patient immediately upon her arrival and believe the patient has compartment syndrome.  Of note he is on Ticagrelor for his cardiac disease.  Per the EMS he did develop significant blistering of the leg in route.  Past Medical History:  Diagnosis Date   Cancer [facility])    CHF (congestive heart failure) (HCC)    EF 45-50% on 08/2019 echo   Chronic kidney disease    RENAL STONES   Coronary artery disease    3 vessel disease: DES to RCA, DES to LCx 08/2019 and medical management   High frequency hearing loss    History of migraine headaches    Hx of colonic polyps    Hyperlipidemia    Hypertension    Obesity    Pulmonary hypertension ([place])    Seborrheic dermatitis    Vitiligo    Past Surgical History:  Procedure Laterality Date   COLON SURGERY  01/30/2001   CORONARY STENT INTERVENTION N/A 08/12/2019   Procedure: CORONARY STENT INTERVENTION;  Surgeon: Nelva Bush, MD;  Location: [facility];  Service: Cardiovascular;  Laterality: N/A;   CYSTOSCOPY/URETEROSCOPY/HOLMIUM LASER/STENT PLACEMENT Left 02/04/2019   Procedure: CYSTOSCOPY Left retrograde and attempted left ureteroscopy;  Surgeon: Raynelle Bring, MD;  Location: [facility];  Service: Urology;  Laterality: Left;  ONLY NEEDS 60 MIN   CYSTOSCOPY/URETEROSCOPY/HOLMIUM LASER/STENT  PLACEMENT Left 11/07/2019   Procedure: CYSTOSCOPY/URETEROSCOPY/HOLMIUM LASER/STENT PLACEMENT;  Surgeon: Raynelle Bring, MD;  Location: [facility];  Service: Urology;  Laterality: Left;   FRACTURE SURGERY     INTRAVASCULAR ULTRASOUND/IVUS N/A 08/12/2019   Procedure: Intravascular Ultrasound/IVUS;  Surgeon: Nelva Bush, MD;  Location: [facility];  Service: Cardiovascular;  Laterality: N/A;   IR CONVERT LEFT NEPHROSTOMY TO NEPHROURETERAL CATH  09/17/2019   IR NEPHROSTOMY PLACEMENT LEFT  02/05/2019   IR NEPHROSTOMY PLACEMENT LEFT  08/06/2019   LEFT HEART CATH AND CORONARY ANGIOGRAPHY N/A 08/09/2019   Procedure: LEFT HEART CATH AND CORONARY ANGIOGRAPHY;  Surgeon: Jettie Booze, MD;  Location: [facility];  Service: Cardiovascular;  Laterality: N/A;   SKIN BIOPSY     TONSILLECTOMY AND ADENOIDECTOMY     Social History   Socioeconomic History   Marital status: Married    Spouse name: Not on file   Number of children: Not on file   Years of education: Not on file   Highest education level: Not on file  Occupational History   Not on file  Tobacco Use   Smoking status: Former    Packs/day: 1.00    Years: 40.00    Pack years: 40.00    Types: Cigarettes    Quit date: 9    Years since quitting: 25.7   Smokeless tobacco: Never  Vaping Use   Vaping Use: Never used  Substance and Sexual Activity   Alcohol use: Not Currently   Drug  use: Never   Sexual activity: Not on file  Other Topics Concern   Not on file  Social History Narrative   Not on file   Social Determinants of Health   Financial Resource Strain: Not on file  Food Insecurity: Not on file  Transportation Needs: Not on file  Physical Activity: Not on file  Stress: Not on file  Social Connections: Not on file   No family history on file. - negative except otherwise stated in the family history section Allergies  Allergen Reactions   Codeine     Bad Headache   Prior to Admission medications    Medication Sig Start Date End Date Taking? Authorizing Provider  aspirin EC 81 MG EC tablet Take 1 tablet (81 mg total) by mouth daily. 08/14/19   Nita Sells, MD  ezetimibe (ZETIA) 10 MG tablet TAKE 1 TABLET(10 MG) BY MOUTH DAILY 01/18/21   Sueanne Margarita, MD  lisinopril (ZESTRIL) 5 MG tablet Take 1 tablet (5 mg total) by mouth daily. 08/19/19   Burtis Junes, NP  metoprolol succinate (TOPROL-XL) 50 MG 24 hr tablet Take 1 tablet (50 mg total) by mouth daily. Take with or immediately following a meal. 08/19/19   Burtis Junes, NP  nitroGLYCERIN (NITROSTAT) 0.4 MG SL tablet Place 1 tablet (0.4 mg total) under the tongue every 5 (five) minutes as needed for chest pain. 08/19/19 11/17/19  Burtis Junes, NP  rosuvastatin (CRESTOR) 5 MG tablet Take 1 tablet (5 mg total) by mouth 2 (two) times a week. 05/11/20   Sueanne Margarita, MD  ticagrelor (BRILINTA) 60 MG TABS tablet Take 1 tablet (60 mg total) by mouth 2 (two) times daily. 08/07/20   Sueanne Margarita, MD   CT HEAD WO CONTRAST (5MM)  Result Date: 01/24/2021 CLINICAL DATA:  Seizure.  Leg injury. EXAM: CT HEAD WITHOUT CONTRAST TECHNIQUE: Contiguous axial images were obtained from the base of the skull through the vertex without intravenous contrast. COMPARISON:  None. FINDINGS: Brain: No evidence of acute infarction, hemorrhage, hydrocephalus, extra-axial collection or mass lesion/mass effect. Ventricular and sulcal enlargement reflecting mild to moderate diffuse atrophy. Patchy areas of white matter hypoattenuation are noted consistent with mild chronic microvascular ischemic change. Vascular: No hyperdense vessel or unexpected calcification. Skull: Normal. Negative for fracture or focal lesion. Sinuses/Orbits: Globes and orbits are unremarkable. Visualized sinuses are clear. Other: None. IMPRESSION: 1. No acute intracranial abnormalities. Electronically Signed   By: Lajean Manes M.D.   On: 01/24/2021 15:18   [place] RT LOWER EXTREM LTD SOFT TISSUE  NON VASCULAR  Result Date: 01/24/2021 CLINICAL DATA:  Right leg injury.  Hit car door this morning. EXAM: ULTRASOUND RIGHT LOWER EXTREMITY LIMITED TECHNIQUE: Ultrasound examination of the lower extremity soft tissues was performed in the area of clinical concern. COMPARISON:  None. FINDINGS: Complex collection in the right posterolateral calf, with no internal blood flow on color Doppler analysis, measuring approximately 16 x 5 x 5 cm, consistent with a hematoma. IMPRESSION: 1. Complex collection in the posterolateral right calf consistent with a hematoma, given the history of trauma. Electronically Signed   By: Lajean Manes M.D.   On: 01/24/2021 14:23   CT EXTREMITY LOWER RIGHT W CONTRAST  Result Date: 01/24/2021 CLINICAL DATA:  Right lower extremity pain and swelling after closed leg in car door. Patient on anticoagulation. EXAM: CT OF THE LOWER RIGHT EXTREMITY WITH CONTRAST TECHNIQUE: Multidetector CT imaging of the lower right extremity was performed according to the standard protocol following  intravenous contrast administration. CONTRAST:  28mL OMNIPAQUE IOHEXOL 350 MG/ML SOLN COMPARISON:  Same-day soft tissue ultrasound FINDINGS: Bones/Joint/Cartilage No acute fracture of the right tibia or fibula. Normal bony alignment without dislocation. No significant arthropathy of the right knee or ankle. No periostitis. No lytic or sclerotic bone lesion. Ligaments Suboptimally assessed by CT. Muscles and Tendons No acute musculotendinous abnormality by CT. No intramuscular fluid collection. The extensor mechanism of the knee is intact. Soft tissues Very large heterogeneously hyperdense fluid collection overlies the posterior compartment of the right calf measuring approximally 26 x 4 x 11 cm. No rim enhancement. There is surrounding stranding and edema within the adjacent soft tissues. Multifocal areas of cutaneous blistering. No soft tissue gas. IMPRESSION: 1. Very large heterogeneously hyperdense fluid  collection overlying the posterior compartment of the right calf measuring approximally 26 x 4 x 11 cm compatible with a large hematoma. Multifocal areas of cutaneous blistering, which can be seen in the setting of compartment syndrome. 2. No acute fracture or dislocation of the right tibia or fibula. These results were called by telephone at the time of interpretation on 01/24/2021 at 3:27 pm to provider MATTHEW TRIFAN , who verbally acknowledged these results. Electronically Signed   By: Davina Poke D.O.   On: 01/24/2021 15:35     Positive ROS: All other systems have been reviewed and were otherwise negative with the exception of those mentioned in the HPI and as above.  Physical Exam: General: No acute distress Cardiovascular: No pedal edema Respiratory: No cyanosis, no use of accessory musculature GI: No organomegaly, abdomen is soft and non-tender Skin: No lesions in the area of chief complaint Neurologic: Sensation intact distally Psychiatric: Patient is at baseline mood and affect Lymphatic: No axillary or cervical lymphadenopathy  MUSCULOSKELETAL:  Right leg has evolving blisters about the anterior lateral as well as posterior lower leg.  He does have very full compartments.  He is able to actively dorsiflex the foot and fire his EHL.  He is able to actively plantarflex.  He can do these with moderate pain.  His toes are warm and well-perfused.  Unable to palpate a pulse as result of his significant swelling although he does appear to have good perfusion at this time.  Sensation is intact without numbness in any distributions of the right foot.  Independent Imaging Review: No fracture.  CT scan of right leg reveals a large subcutaneous posterior hematoma.  There is no active bleeding or extravasation  Assessment: 75 year old male with right leg swelling and blistering after he hit it in a car door at 730 this morning.  On CT scan his hematoma does appear to be subcutaneous.  This is  consistent with his ability to actively fire his anterior as well as posterior compartments.  That being said he does have significantly evolving fracture blisters with very full palpable compartments.  I am concerned about the mass-effect of the swelling and as such believe that he has right lower leg compartment syndrome.  We will plan to proceed to the OR emergently for right lower leg 4 compartment fasciotomy as well as wound VAC placement I did counsel him that this surgery is required emergently in order to save his leg and prevent loss of blood flow.  We discussed the need for returning to the operating room in future days for wound closure versus possible wound VAC placement  Plan: -Plan for emergent right lower leg 4 compartment fasciotomy and wound VAC placement   After a lengthy  discussion of treatment options, including risks, benefits, alternatives, complications of surgical and nonsurgical conservative options, the patient elected surgical repair.   The patient  is aware of the material risks  and complications including, but not limited to injury to adjacent structures, neurovascular injury, infection, numbness, bleeding, implant failure, thermal burns, stiffness, persistent pain, failure to heal, disease transmission from allograft, need for further surgery, dislocation, anesthetic risks, blood clots, risks of death,and others. The probabilities of surgical success and failure discussed with patient given their particular co-morbidities.The time and nature of expected rehabilitation and recovery was discussed.The patient's questions were all answered preoperatively.  No barriers to understanding were noted. I explained the natural history of the disease process and Rx rationale.  I explained to the patient what I considered to be reasonable expectations given their personal situation.  The final treatment plan was arrived at through a shared patient decision making process model.   Thank  you for the consult and the opportunity to see Mr. [name] Heindel, MD [facility] 4:11 PM

## 2021-01-25 ENCOUNTER — Encounter (HOSPITAL_COMMUNITY): Payer: Self-pay | Admitting: Orthopaedic Surgery

## 2021-01-25 DIAGNOSIS — T79A21A Traumatic compartment syndrome of right lower extremity, initial encounter: Secondary | ICD-10-CM | POA: Diagnosis not present

## 2021-01-25 DIAGNOSIS — T148XXA Other injury of unspecified body region, initial encounter: Secondary | ICD-10-CM | POA: Diagnosis not present

## 2021-01-25 LAB — BASIC METABOLIC PANEL
Anion gap: 8 (ref 5–15)
BUN: 16 mg/dL (ref 8–23)
CO2: 26 mmol/L (ref 22–32)
Calcium: 8.5 mg/dL — ABNORMAL LOW (ref 8.9–10.3)
Chloride: 103 mmol/L (ref 98–111)
Creatinine, Ser: 1.45 mg/dL — ABNORMAL HIGH (ref 0.61–1.24)
GFR, Estimated: 50 mL/min — ABNORMAL LOW (ref >60)
Glucose, Bld: 138 mg/dL — ABNORMAL HIGH (ref 70–99)
Potassium: 4.6 mmol/L (ref 3.5–5.1)
Sodium: 137 mmol/L (ref 135–145)

## 2021-01-25 LAB — CBC WITH DIFFERENTIAL/PLATELET
Abs Immature Granulocytes: 0.08 10*3/uL — ABNORMAL HIGH (ref 0.00–0.07)
Basophils Absolute: 0 10*3/uL (ref 0.0–0.1)
Basophils Relative: 0 %
Eosinophils Absolute: 0 10*3/uL (ref 0.0–0.5)
Eosinophils Relative: 0 %
HCT: 38.2 % — ABNORMAL LOW (ref 39.0–52.0)
Hemoglobin: 12.5 g/dL — ABNORMAL LOW (ref 13.0–17.0)
Immature Granulocytes: 1 %
Lymphocytes Relative: 7 %
Lymphs Abs: 0.9 10*3/uL (ref 0.7–4.0)
MCH: 28.6 pg (ref 26.0–34.0)
MCHC: 32.7 g/dL (ref 30.0–36.0)
MCV: 87.4 fL (ref 80.0–100.0)
Monocytes Absolute: 1.2 10*3/uL — ABNORMAL HIGH (ref 0.1–1.0)
Monocytes Relative: 9 %
Neutro Abs: 11 10*3/uL — ABNORMAL HIGH (ref 1.7–7.7)
Neutrophils Relative %: 83 %
Platelets: 255 10*3/uL (ref 150–400)
RBC: 4.37 MIL/uL (ref 4.22–5.81)
RDW: 14.4 % (ref 11.5–15.5)
WBC: 13.3 10*3/uL — ABNORMAL HIGH (ref 4.0–10.5)
nRBC: 0 % (ref 0.0–0.2)

## 2021-01-25 MED ORDER — ENSURE MAX PROTEIN PO LIQD
11.0000 [oz_av] | Freq: Every day | ORAL | Status: DC
  Administered 2021-01-25 – 2021-02-10 (×17): 11 [oz_av] via ORAL
  Filled 2021-01-25: qty 330

## 2021-01-25 MED ORDER — ADULT MULTIVITAMIN W/MINERALS CH
1.0000 | ORAL_TABLET | Freq: Every day | ORAL | Status: DC
  Administered 2021-01-25 – 2021-02-11 (×16): 1 via ORAL
  Filled 2021-01-25 (×16): qty 1

## 2021-01-25 NOTE — Progress Notes (Signed)
PROGRESS NOTE    [name]  XBD:[id number] DOB: 29-Jun-1945 DOA: 01/24/2021 PCP: [name] Noon, MD (Confirm with patient/family/NH records and if not entered, this HAS to be entered at [facility] point of entry. "No PCP" if truly none.)   Chief Complaint  Patient presents with   Leg Injury   Leg Pain    Brief Narrative:  [name] is [name] 75 y.o. male with medical history significant of CAD MI with stenting 08/2019 on ASA and Brilinta, HTN, HLD, CKD stage II, hearing loss, morbid obesity, came with [name] dull trauma on right calf by [name] car door this morning.   This morning, patient accidentally slammed the car door to his right calf. Then developed worsening of right calf pain, swelling and hard to stand right leg.   As patient remembers, while in the [name], he was starting to feel decreased right foot sensation and hard to move due to severe pain, while he was trying to reposition himself to make the right leg more comfortable while sitting in [name] wheelchair, he was trying to sit up and then started to feel lightheaded and then he lost his consciousness and slumped backward.  ED staff noticed patient had "arm shaking" movement and urinary incontinence. Then patient suddenly waking up when ED staff was trying to move him from the wheelchair to the bed. No meds given and no post ictal confusion. No chest pain or SOB.  Assessment & Plan:   Active Problems:   Compartment syndrome (HCC)  Right lower extremity compartment syndrome -after slamming leg in car door -S/P right lower leg 4 compartment fasciotomy and wound vac. Dr. Sammuel [name] plans to take the patient back to OR in 2-3 days for wound closure. -Pain control -holding brilinta, continue lovenox for DVT ppx   Syncope -Likely secondary to pain -negative troponin x1 - EKG pending, low suspicion for ACS - low suspicion for seizure at this time, consider EEG   CAD with stenting -continue aspirin for now, his stent was done  in 08/2019 - holding brilinta - apparently he stopped metoprolol due to hypotension - continue lisinopril   HTN -continue lisinopril  Dyslipidemia Zetia    CKD stage II -continue ace  DVT prophylaxis: lovenox Code Status: full  Family Communication: (none at bedside Disposition:   Status is: Inpatient  Remains inpatient appropriate because:Inpatient level of care appropriate due to severity of illness  Dispo: The patient is from: Home              Anticipated d/c is to: Home              Patient currently is not medically stable to d/c.   Difficult to place patient No       Consultants:  ortho  Procedures:  9/25 PROCEDURE: 1.  Right lower leg 4 compartment fasciotomy 2.  Stryker pressure measurements of right anterior, lateral, superficial posterior, deep posterior compartments 3.  Application of deep wound VAC wound greater than 50 cm  Antimicrobials: Anti-infectives (From admission, onward)    Start     Dose/Rate Route Frequency Ordered Stop   01/24/21 1630  ceFAZolin (ANCEF) IVPB 2g/100 mL premix        2 g 200 mL/hr over 30 Minutes Intravenous  Once 01/24/21 1619 01/24/21 1654          Subjective: No complaints  Objective: Vitals:   01/24/21 2054 01/24/21 2143 01/25/21 0819 01/25/21 0827  BP: 131/75 123/68 124/65 114/72  Pulse: 84 71 65  64  Resp: 20 18 18 19   Temp: 98.2 F (36.8 C) (!) 97.5 F (36.4 C) 98.4 F (36.9 C) 97.7 F (36.5 C)  TempSrc:  Oral Oral Oral  SpO2: 100% 98% 99% 99%  Weight:      Height:        Intake/Output Summary (Last 24 hours) at 01/25/2021 1554 Last data filed at 01/25/2021 1440 Gross per 24 hour  Intake 840 ml  Output 530 ml  Net 310 ml   Filed Weights   01/24/21 1229  Weight: 119.7 kg    Examination:  General exam: Appears calm and comfortable  Respiratory system: unlabored Cardiovascular system: RRR Gastrointestinal system: Abdomen is nondistended, soft and nontender.  Central nervous system:  Alert and oriented. No focal neurological deficits. Extremities: RLE with dressing intact with wound vac - swollen, palpable DP pulse     Data Reviewed: I have personally reviewed following labs and imaging studies  CBC: Recent Labs  Lab 01/24/21 1242 01/25/21 0445  WBC 9.1 13.3*  NEUTROABS 6.8 11.0*  HGB 14.8 12.5*  HCT 44.2 38.2*  MCV 85.5 87.4  PLT 248 409    Basic Metabolic Panel: Recent Labs  Lab 01/24/21 1242 01/25/21 0445  NA 139 137  K 4.0 4.6  CL 106 103  CO2 24 26  GLUCOSE 139* 138*  BUN 19 16  CREATININE 1.36* 1.45*  CALCIUM 9.1 8.5*    GFR: Estimated Creatinine Clearance: 53.7 mL/min ([name]) (by C-G formula based on SCr of 1.45 mg/dL (H)).  Liver Function Tests: No results for input(s): AST, ALT, ALKPHOS, BILITOT, PROT, ALBUMIN in the last 168 hours.  CBG: No results for input(s): GLUCAP in the last 168 hours.   Recent Results (from the past 240 hour(s))  SARS Coronavirus 2 by RT PCR (hospital order, performed in [facility] hospital lab) Nasopharyngeal Nasopharyngeal Swab     Status: None   Collection Time: 01/24/21  5:05 PM   Specimen: Nasopharyngeal Swab  Result Value Ref Range Status   SARS Coronavirus 2 NEGATIVE NEGATIVE Final    Comment: (NOTE) SARS-CoV-2 target nucleic acids are NOT DETECTED.  The SARS-CoV-2 RNA is generally detectable in upper and lower respiratory specimens during the acute phase of infection. The lowest concentration of SARS-CoV-2 viral copies this assay can detect is 250 copies / mL. [name] negative result does not preclude SARS-CoV-2 infection and should not be used as the sole basis for treatment or other patient management decisions.  [name] negative result may occur with improper specimen collection / handling, submission of specimen other than nasopharyngeal swab, presence of viral mutation(s) within the areas targeted by this assay, and inadequate number of viral copies (<250 copies / mL). [name] negative result must be  combined with clinical observations, patient history, and epidemiological information.  Fact Sheet for Patients:   StrictlyIdeas.no  Fact Sheet for Healthcare Providers: BankingDealers.co.za  This test is not yet approved or  cleared by the [place] FDA and has been authorized for detection and/or diagnosis of SARS-CoV-2 by FDA under an Emergency Use Authorization (EUA).  This EUA will remain in effect (meaning this test can be used) for the duration of the COVID-19 declaration under Section 564(b)(1) of the Act, 21 U.S.C. section 360bbb-3(b)(1), unless the authorization is terminated or revoked sooner.  Performed at [facility], [place] [address], [place], [place] [address]          Radiology Studies: CT HEAD WO CONTRAST (5MM)  Result Date: 01/24/2021 CLINICAL DATA:  Seizure.  Leg injury. EXAM: CT HEAD WITHOUT CONTRAST TECHNIQUE: Contiguous axial images were obtained from the base of the skull through the vertex without intravenous contrast. COMPARISON:  None. FINDINGS: Brain: No evidence of acute infarction, hemorrhage, hydrocephalus, extra-axial collection or mass lesion/mass effect. Ventricular and sulcal enlargement reflecting mild to moderate diffuse atrophy. Patchy areas of white matter hypoattenuation are noted consistent with mild chronic microvascular ischemic change. Vascular: No hyperdense vessel or unexpected calcification. Skull: Normal. Negative for fracture or focal lesion. Sinuses/Orbits: Globes and orbits are unremarkable. Visualized sinuses are clear. Other: None. IMPRESSION: 1. No acute intracranial abnormalities. Electronically Signed   By: Lajean Manes M.D.   On: 01/24/2021 15:18   [place] RT LOWER EXTREM LTD SOFT TISSUE NON VASCULAR  Result Date: 01/24/2021 CLINICAL DATA:  Right leg injury.  Hit car door this morning. EXAM: ULTRASOUND RIGHT LOWER EXTREMITY LIMITED TECHNIQUE: Ultrasound examination of the lower  extremity soft tissues was performed in the area of clinical concern. COMPARISON:  None. FINDINGS: Complex collection in the right posterolateral calf, with no internal blood flow on color Doppler analysis, measuring approximately 16 x 5 x 5 cm, consistent with Kamri Gotsch hematoma. IMPRESSION: 1. Complex collection in the posterolateral right calf consistent with Itzael Liptak hematoma, given the history of trauma. Electronically Signed   By: Lajean Manes M.D.   On: 01/24/2021 14:23   CT EXTREMITY LOWER RIGHT W CONTRAST  Result Date: 01/24/2021 CLINICAL DATA:  Right lower extremity pain and swelling after closed leg in car door. Patient on anticoagulation. EXAM: CT OF THE LOWER RIGHT EXTREMITY WITH CONTRAST TECHNIQUE: Multidetector CT imaging of the lower right extremity was performed according to the standard protocol following intravenous contrast administration. CONTRAST:  44mL OMNIPAQUE IOHEXOL 350 MG/ML SOLN COMPARISON:  Same-day soft tissue ultrasound FINDINGS: Bones/Joint/Cartilage No acute fracture of the right tibia or fibula. Normal bony alignment without dislocation. No significant arthropathy of the right knee or ankle. No periostitis. No lytic or sclerotic bone lesion. Ligaments Suboptimally assessed by CT. Muscles and Tendons No acute musculotendinous abnormality by CT. No intramuscular fluid collection. The extensor mechanism of the knee is intact. Soft tissues Very large heterogeneously hyperdense fluid collection overlies the posterior compartment of the right calf measuring approximally 26 x 4 x 11 cm. No rim enhancement. There is surrounding stranding and edema within the adjacent soft tissues. Multifocal areas of cutaneous blistering. No soft tissue gas. IMPRESSION: 1. Very large heterogeneously hyperdense fluid collection overlying the posterior compartment of the right calf measuring approximally 26 x 4 x 11 cm compatible with Terrelle Ruffolo large hematoma. Multifocal areas of cutaneous blistering, which can be seen in the  setting of compartment syndrome. 2. No acute fracture or dislocation of the right tibia or fibula. These results were called by telephone at the time of interpretation on 01/24/2021 at 3:27 pm to provider MATTHEW TRIFAN , who verbally acknowledged these results. Electronically Signed   By: Davina Poke D.O.   On: 01/24/2021 15:35        Scheduled Meds:  aspirin EC  81 mg Oral Daily   enoxaparin (LOVENOX) injection  40 mg Subcutaneous Q24H   ezetimibe  10 mg Oral Daily   lisinopril  5 mg Oral Daily   multivitamin with minerals  1 tablet Oral Daily   Ensure Max Protein  11 oz Oral QHS   rosuvastatin  5 mg Oral Once per day on Mon Thu   Continuous Infusions:  sodium chloride       LOS: 1 day  Time spent: over 30 min    Fayrene Helper, MD [facility]   To contact the attending provider between 7A-7P or the covering provider during after hours 7P-7A, please log into the web site [email or web address] and access using universal [place] password for that web site. If you do not have the password, please call the hospital operator.  01/25/2021, 3:54 PM

## 2021-01-25 NOTE — TOC Progression Note (Signed)
Transition of Care [facility]) - Initial/Assessment Note    Patient Details  Name: [name] MRN: [id number] Date of Birth: 21-Jul-1945  Transition of Care [facility]) CM/SW Contact:    [name], [place] Phone Number: 01/25/2021, 8:57 AM  Clinical Narrative:                  CSW following patient for any d/c planning needs once medically stable.  Pending:  PT/ OT assessments and recommendations  Lind Covert, MSW, LCSWA        Patient Goals and CMS Choice        Expected Discharge Plan and Services                                                Prior Living Arrangements/Services                       Activities of Daily Living Home Assistive Devices/Equipment: None ADL Screening (condition at time of admission) Patient's cognitive ability adequate to safely complete daily activities?: Yes Is the patient deaf or have difficulty hearing?: No Does the patient have difficulty seeing, even when wearing glasses/contacts?: No Does the patient have difficulty concentrating, remembering, or making decisions?: No Patient able to express need for assistance with ADLs?: Yes Does the patient have difficulty dressing or bathing?: No Independently performs ADLs?: Yes (appropriate for developmental age) Does the patient have difficulty walking or climbing stairs?: No Weakness of Legs: None Weakness of Arms/Hands: None  Permission Sought/Granted                  Emotional Assessment              Admission diagnosis:  Hematoma [T14.8XXA] Compartment syndrome ([place]) [T79.A0XA] Patient Active Problem List   Diagnosis Date Noted   Compartment syndrome ([place]) 01/24/2021   Myalgia due to statin 03/12/2020   Coronary artery disease due to lipid rich plaque    Non-ST elevation (NSTEMI) myocardial infarction [facility])    Hypertensive emergency    Elevated troponin    Leg edema    Nephrolithiasis 08/04/2019   AKI (acute kidney injury) ([place]) 08/04/2019    Hypokalemia 08/04/2019   infected Renal calculus, left 02/04/2019   PCP:  [name], MD Pharmacy:   [facility] [place], [place] - [address] [place] [address] [place] [address] Phone: [phone] Fax: [place], [address] [place] [address] Phone: [phone] Fax: [phone]     Social Determinants of Health (SDOH) Interventions    Readmission Risk Interventions No flowsheet data found.

## 2021-01-25 NOTE — Plan of Care (Signed)

## 2021-01-25 NOTE — Progress Notes (Signed)
Initial Nutrition Assessment  DOCUMENTATION CODES:   Morbid obesity  INTERVENTION:   -Ensure Max po daily, each supplement provides 150 kcal and 30 grams of protein.   -MVI with minerals daily -Double protein portions with meals -Magic cup TID with meals, each supplement provides 290 kcal and 9 grams of protein   NUTRITION DIAGNOSIS:   Increased nutrient needs related to wound healing, post-op healing as evidenced by estimated needs.  GOAL:   Patient will meet greater than or equal to 90% of their needs  MONITOR:   PO intake, Supplement acceptance, Diet advancement, Labs, Weight trends, Skin, I & O's  REASON FOR ASSESSMENT:   Consult Assessment of nutrition requirement/status, Hip fracture protocol  ASSESSMENT:   [name] is a 75 y.o. male who presents status post hitting his right leg in a car door at approximately 730 this morning.  He states that he did not initially have pain although developed pain in the ensuing hours.  [facility] and subsequently was evaluated.  CT scan showed a large hematoma in the posterior gastrocs.  He is subsequently consulted me to discuss possible compartment syndrome.  I requested emergent transfer to [facility].  I saw the patient immediately upon her arrival and believe the patient has compartment syndrome.  Of note he is on Ticagrelor for his cardiac disease.  Per the EMS he did develop significant blistering of the leg in route.  Pt admitted with rt lower extremity compartment syndrome.   9/25- s/p PROCEDURE: 1.  Right lower leg 4 compartment fasciotomy 2.  Stryker pressure measurements of right anterior, lateral, superficial posterior, deep posterior compartments 3.  Application of deep wound VAC wound greater than 50 cm  Reviewed I/O's: +400 ml x 24 hours  Reviewed wt hx; wt has been stable over the past year.   Pt sleeping soundly at time of visit. He did not respond to  voice or touch.   Noted lunch tray on counter; noted pt consumed 100% of tray. Documented meal completions 100%.   Reviewed wt hx; wt has been stable over the past 6 months.   Medications reviewed.   Labs reviewed.   NUTRITION - FOCUSED PHYSICAL EXAM:  Flowsheet Row Most Recent Value  Orbital Region No depletion  Upper Arm Region No depletion  Thoracic and Lumbar Region No depletion  Buccal Region No depletion  Temple Region No depletion  Clavicle Bone Region No depletion  Clavicle and Acromion Bone Region No depletion  Scapular Bone Region No depletion  Dorsal Hand No depletion  Patellar Region No depletion  Anterior Thigh Region No depletion  Posterior Calf Region No depletion  Edema (RD Assessment) None  Hair Reviewed  Eyes Reviewed  Mouth Reviewed  Skin Reviewed  Nails Reviewed       Diet Order:   Diet Order             Diet Heart Room service appropriate? Yes; Fluid consistency: Thin  Diet effective now                   EDUCATION NEEDS:   No education needs have been identified at this time  Skin:  Skin Assessment: Skin Integrity Issues: Skin Integrity Issues:: Wound VAC Wound Vac: rt leg s/p fasciostomy  Last BM:  Unknown  Height:   Ht Readings from Last 1 Encounters:  01/24/21 5\' 8"  (1.727 m)    Weight:   Wt Readings from Last 1 Encounters:  01/24/21 119.7 kg    Ideal Body Weight:  67.3 kg  BMI:  Body mass index is 40.14 kg/m.  Estimated Nutritional Needs:   Kcal:  4862-8241  Protein:  125-140 grams  Fluid:  > 2 L    Loistine Chance, RD, LDN, [place] Registered Dietitian II Certified Diabetes Care and Education Specialist Please refer to [facility] for RD and/or RD on-call/weekend/after hours pager

## 2021-01-25 NOTE — TOC CAGE-AID Note (Addendum)
Transition of Care [facility]) - CAGE-AID Screening   Patient Details  Name: [name] MRN: [id number] Date of Birth: 1946-04-13  Transition of [facility]) CM/SW Contact:    Gaetano Hawthorne Tarpley-Carter, [place] Phone Number: 01/25/2021, 3:41 PM   Clinical Narrative Pt participated in [place].  Pt stated he does not use substance or ETOH.  Pt was not offered resources, due to no usage of substance or ETOH.     Tiena Manansala [facility], MSW, LCSW-A Pronouns:  She/Her/Hers [facility] Clinical Social Worker Direct Number:  [phone] [email or web address]   CAGE-AID Screening:    Have You Ever Felt You Ought to Cut Down on Your Drinking or Drug Use?: No Have People Annoyed You By [employer] Your Drinking Or Drug Use?: No Have You Felt Bad Or Guilty About Your Drinking Or Drug Use?: No Have You Ever Had a Drink or Used Drugs First Thing In The Morning to Steady Your Nerves or to Get Rid of a Hangover?: No CAGE-AID Score: 0  Substance Abuse Education Offered: No

## 2021-01-25 NOTE — Progress Notes (Signed)
   Subjective:  Patient reports pain as mild.  He has been able to tolerate food.  He is voiding okay.  Continues to have function of the right lower extremity foot and ankle and denies any numbness.  Hemodynamically stable overnight  Objective:   VITALS:   Vitals:   01/24/21 1954 01/24/21 2024 01/24/21 2054 01/24/21 2143  BP: 132/67 131/69 131/75 123/68  Pulse: 77 73 84 71  Resp: 16 16 20 18   Temp:   98.2 F (36.8 C) (!) 97.5 F (36.4 C)  TempSrc:    Oral  SpO2: 98% 96% 100% 98%  Weight:      Height:        Wound VAC in place over the right wounds with clean dressings.  The wound VAC canister is approximately three quarters full with serosanguineous fluid.  His right foot is warm and well-perfused.  Again hard to palpate DP due to the swelling over the right foot.  All toes have less than 2-second cap refill.  He is able to dorsiflex at the right ankle and extend all of his right toes against resistance.  Full strength in plantar flexion.  Sensation is intact and equal to the contralateral side in all distributions of the right foot   Lab Results  Component Value Date   WBC 13.3 (H) 01/25/2021   HGB 12.5 (L) 01/25/2021   HCT 38.2 (L) 01/25/2021   MCV 87.4 01/25/2021   PLT 255 01/25/2021     Assessment/Plan:  1 Day Post-Op   - Expected postop acute blood loss anemia - will monitor for symptoms -We will plan for right lower extremity wound closures on Wednesday 9/28 - Plan for PT consult following wound closure on Wednesday - DVT ppx - SCDs, ambulation, - Postoperative Abx: Ancef x 2 additional doses given - WBAT operative extremity - Pain control - multimodal pain management, ATC acetaminophen in conjunction with as needed narcotic (oxycodone), although this should be minimized with other modalities  -Patient is on Lovenox for DVT prophylaxis, will resume home blood thinners following wound closure  Keria Widrig 01/25/2021, 7:30 AM

## 2021-01-26 DIAGNOSIS — T79A21A Traumatic compartment syndrome of right lower extremity, initial encounter: Secondary | ICD-10-CM | POA: Diagnosis not present

## 2021-01-26 LAB — URINALYSIS, COMPLETE (UACMP) WITH MICROSCOPIC
Bacteria, UA: NONE SEEN
Bilirubin Urine: NEGATIVE
Glucose, UA: NEGATIVE mg/dL
Ketones, ur: NEGATIVE mg/dL
Leukocytes,Ua: NEGATIVE
Nitrite: NEGATIVE
Protein, ur: NEGATIVE mg/dL
Specific Gravity, Urine: 1.018 (ref 1.005–1.030)
pH: 6 (ref 5.0–8.0)

## 2021-01-26 LAB — COMPREHENSIVE METABOLIC PANEL
ALT: 16 U/L (ref 0–44)
AST: 15 U/L (ref 15–41)
Albumin: 2.9 g/dL — ABNORMAL LOW (ref 3.5–5.0)
Alkaline Phosphatase: 55 U/L (ref 38–126)
Anion gap: 8 (ref 5–15)
BUN: 21 mg/dL (ref 8–23)
CO2: 24 mmol/L (ref 22–32)
Calcium: 9.2 mg/dL (ref 8.9–10.3)
Chloride: 105 mmol/L (ref 98–111)
Creatinine, Ser: 1.72 mg/dL — ABNORMAL HIGH (ref 0.61–1.24)
GFR, Estimated: 40 mL/min — ABNORMAL LOW (ref >60)
Glucose, Bld: 120 mg/dL — ABNORMAL HIGH (ref 70–99)
Potassium: 4.6 mmol/L (ref 3.5–5.1)
Sodium: 137 mmol/L (ref 135–145)
Total Bilirubin: 0.7 mg/dL (ref 0.3–1.2)
Total Protein: 5.6 g/dL — ABNORMAL LOW (ref 6.5–8.1)

## 2021-01-26 LAB — MAGNESIUM: Magnesium: 2.1 mg/dL (ref 1.7–2.4)

## 2021-01-26 LAB — CBC WITH DIFFERENTIAL/PLATELET
Abs Immature Granulocytes: 0.08 10*3/uL — ABNORMAL HIGH (ref 0.00–0.07)
Basophils Absolute: 0.1 10*3/uL (ref 0.0–0.1)
Basophils Relative: 1 %
Eosinophils Absolute: 0.2 10*3/uL (ref 0.0–0.5)
Eosinophils Relative: 2 %
HCT: 36.9 % — ABNORMAL LOW (ref 39.0–52.0)
Hemoglobin: 12 g/dL — ABNORMAL LOW (ref 13.0–17.0)
Immature Granulocytes: 1 %
Lymphocytes Relative: 18 %
Lymphs Abs: 2 10*3/uL (ref 0.7–4.0)
MCH: 28.8 pg (ref 26.0–34.0)
MCHC: 32.5 g/dL (ref 30.0–36.0)
MCV: 88.5 fL (ref 80.0–100.0)
Monocytes Absolute: 1.7 10*3/uL — ABNORMAL HIGH (ref 0.1–1.0)
Monocytes Relative: 15 %
Neutro Abs: 7.4 10*3/uL (ref 1.7–7.7)
Neutrophils Relative %: 63 %
Platelets: 217 10*3/uL (ref 150–400)
RBC: 4.17 MIL/uL — ABNORMAL LOW (ref 4.22–5.81)
RDW: 14.8 % (ref 11.5–15.5)
WBC: 11.4 10*3/uL — ABNORMAL HIGH (ref 4.0–10.5)
nRBC: 0 % (ref 0.0–0.2)

## 2021-01-26 LAB — PHOSPHORUS: Phosphorus: 3.6 mg/dL (ref 2.5–4.6)

## 2021-01-26 MED ORDER — ACETAMINOPHEN 325 MG PO TABS: 650.0000 mg | ORAL_TABLET | Freq: Four times a day (QID) | ORAL | Status: AC | PRN

## 2021-01-26 MED ORDER — TRAZODONE HCL 50 MG PO TABS
50.0000 mg | ORAL_TABLET | Freq: Every evening | ORAL | Status: DC | PRN
  Administered 2021-01-27 – 2021-02-10 (×14): 50 mg via ORAL
  Filled 2021-01-26 (×14): qty 1

## 2021-01-26 MED ORDER — OXYCODONE HCL 5 MG PO TABS
5.0000 mg | ORAL_TABLET | ORAL | Status: DC | PRN
  Administered 2021-01-26 (×3): 5 mg via ORAL
  Filled 2021-01-26 (×3): qty 1

## 2021-01-26 MED ORDER — ACETAMINOPHEN 500 MG PO TABS
1000.0000 mg | ORAL_TABLET | Freq: Three times a day (TID) | ORAL | Status: AC
  Administered 2021-01-26 – 2021-01-28 (×9): 1000 mg via ORAL
  Filled 2021-01-26 (×9): qty 2

## 2021-01-26 MED ORDER — LACTATED RINGERS IV SOLN: INTRAVENOUS | Status: AC

## 2021-01-26 MED ORDER — LISINOPRIL 5 MG PO TABS
5.0000 mg | ORAL_TABLET | Freq: Every day | ORAL | Status: DC
  Administered 2021-01-28 – 2021-02-05 (×8): 5 mg via ORAL
  Filled 2021-01-26 (×8): qty 1

## 2021-01-26 NOTE — Plan of Care (Signed)

## 2021-01-26 NOTE — Plan of Care (Signed)

## 2021-01-26 NOTE — Progress Notes (Addendum)
PROGRESS NOTE    [name]  LOV:[id number] DOB: 1945/09/17 DOA: 01/24/2021 PCP: [name], MD (Confirm with patient/family/NH records and if not entered, this HAS to be entered at [facility] point of entry. "No PCP" if truly none.)   Chief Complaint  Patient presents with   Leg Injury   Leg Pain    Brief Narrative:  [name] is [name] 75 y.o. male with medical history significant of CAD MI with stenting 08/2019 on ASA and Brilinta, HTN, HLD, CKD stage II, hearing loss, morbid obesity, came with [name] dull trauma on right calf by Rexine Gowens car door this morning.   This morning, patient accidentally slammed the car door to his right calf. Then developed worsening of right calf pain, swelling and hard to stand right leg.   As patient remembers, while in the [facility] ED, he was starting to feel decreased right foot sensation and hard to move due to severe pain, while he was trying to reposition himself to make the right leg more comfortable while sitting in Raheen Capili wheelchair, he was trying to sit up and then started to feel lightheaded and then he lost his consciousness and slumped backward.  ED staff noticed patient had "arm shaking" movement and urinary incontinence. Then patient suddenly waking up when ED staff was trying to move him from the wheelchair to the bed. No meds given and no post ictal confusion. No chest pain or SOB.  He was admitted for compartment syndrome.  Now s/p emergent fasciotomy.  Plan to return to OR on 9/28.  Assessment & Plan:   Active Problems:   Compartment syndrome (HCC)   Hematoma  Right lower extremity compartment syndrome -after slamming leg in car door -S/P right lower leg 4 compartment fasciotomy and wound vac. Dr. [name] plans to take the patient back to OR in 2-3 days for wound closure/wound debridement. -Pain control -holding brilinta, continue lovenox for DVT ppx   Syncope -Likely secondary to pain -negative troponin x1 - EKG pending, low  suspicion for ACS - low suspicion for seizure at this time, consider EEG   CKD IIIa - creatinine fluctuating, up today - monitor with IVF, continue lisinopril for now (hold tomorrow given surgery)  CAD with stenting -continue aspirin for now, his stent was done in 08/2019 - holding brilinta - apparently he stopped metoprolol due to hypotension - continue lisinopril   HTN -continue lisinopril  Dyslipidemia Zetia    CKD stage II -continue ace  DVT prophylaxis: lovenox Code Status: full  Family Communication: (none at bedside Disposition:   Status is: Inpatient  Remains inpatient appropriate because:Inpatient level of care appropriate due to severity of illness  Dispo: The patient is from: Home              Anticipated d/c is to: Home              Patient currently is not medically stable to d/c.   Difficult to place patient No       Consultants:  ortho  Procedures:  9/25 PROCEDURE: 1.  Right lower leg 4 compartment fasciotomy 2.  Stryker pressure measurements of right anterior, lateral, superficial posterior, deep posterior compartments 3.  Application of deep wound VAC wound greater than 50 cm  Antimicrobials: Anti-infectives (From admission, onward)    Start     Dose/Rate Route Frequency Ordered Stop   01/24/21 1630  ceFAZolin (ANCEF) IVPB 2g/100 mL premix        2 g 200 mL/hr over 30  Minutes Intravenous  Once 01/24/21 1619 01/24/21 1654          Subjective: C/o leg pain  Objective: Vitals:   01/25/21 0827 01/25/21 2243 01/26/21 0800 01/26/21 1524  BP: 114/72 117/64 119/60 (!) 148/58  Pulse: 64 64 69 80  Resp: 19 18 14 16   Temp: 97.7 F (36.5 C) 98.2 F (36.8 C) 98.8 F (37.1 C) 97.7 F (36.5 C)  TempSrc: Oral  Oral Oral  SpO2: 99% 100% 100% 99%  Weight:      Height:        Intake/Output Summary (Last 24 hours) at 01/26/2021 2051 Last data filed at 01/26/2021 1819 Gross per 24 hour  Intake 1213.63 ml  Output 850 ml  Net 363.63 ml    Filed Weights   01/24/21 1229  Weight: 119.7 kg    Examination:  General: No acute distress. Cardiovascular: RRR Lungsunlabored Abdomen: Soft, nontender, nondistended  Neurological: Alert and oriented 3. Moves all extremities 4 . Cranial nerves II through XII grossly intact. Skin: Warm and dry. No rashes or lesions. Extremities: RLE with swelling, wound vac in place - palpable dp pulse     Data Reviewed: I have personally reviewed following labs and imaging studies  CBC: Recent Labs  Lab 01/24/21 1242 01/25/21 0445 01/26/21 0039  WBC 9.1 13.3* 11.4*  NEUTROABS 6.8 11.0* 7.4  HGB 14.8 12.5* 12.0*  HCT 44.2 38.2* 36.9*  MCV 85.5 87.4 88.5  PLT 248 255 165    Basic Metabolic Panel: Recent Labs  Lab 01/24/21 1242 01/25/21 0445 01/26/21 0039  NA 139 137 137  K 4.0 4.6 4.6  CL 106 103 105  CO2 24 26 24   GLUCOSE 139* 138* 120*  BUN 19 16 21   CREATININE 1.36* 1.45* 1.72*  CALCIUM 9.1 8.5* 9.2  MG  --   --  2.1  PHOS  --   --  3.6    GFR: Estimated Creatinine Clearance: 45.3 mL/min (Sunaina Ferrando) (by C-G formula based on SCr of 1.72 mg/dL (H)).  Liver Function Tests: Recent Labs  Lab 01/26/21 0039  AST 15  ALT 16  ALKPHOS 55  BILITOT 0.7  PROT 5.6*  ALBUMIN 2.9*    CBG: No results for input(s): GLUCAP in the last 168 hours.   Recent Results (from the past 240 hour(s))  SARS Coronavirus 2 by RT PCR (hospital order, performed in [facility] hospital lab) Nasopharyngeal Nasopharyngeal Swab     Status: None   Collection Time: 01/24/21  5:05 PM   Specimen: Nasopharyngeal Swab  Result Value Ref Range Status   SARS Coronavirus 2 NEGATIVE NEGATIVE Final    Comment: (NOTE) SARS-CoV-2 target nucleic acids are NOT DETECTED.  The SARS-CoV-2 RNA is generally detectable in upper and lower respiratory specimens during the acute phase of infection. The lowest concentration of SARS-CoV-2 viral copies this assay can detect is 250 copies / mL. Kerrilyn Azbill negative result does  not preclude SARS-CoV-2 infection and should not be used as the sole basis for treatment or other patient management decisions.  Atira Borello negative result may occur with improper specimen collection / handling, submission of specimen other than nasopharyngeal swab, presence of viral mutation(s) within the areas targeted by this assay, and inadequate number of viral copies (<250 copies / mL). Tatanisha Cuthbert negative result must be combined with clinical observations, patient history, and epidemiological information.  Fact Sheet for Patients:   StrictlyIdeas.no  Fact Sheet for Healthcare Providers: BankingDealers.co.za  This test is not yet approved or  cleared by  the [employer] and has been authorized for detection and/or diagnosis of SARS-CoV-2 by FDA under an Emergency Use Authorization (EUA).  This EUA will remain in effect (meaning this test can be used) for the duration of the COVID-19 declaration under Section 564(b)(1) of the Act, 21 U.S.C. section 360bbb-3(b)(1), unless the authorization is terminated or revoked sooner.  Performed at [facility], [place] [address] [place], [place] [address]          Radiology Studies: No results found.      Scheduled Meds:  acetaminophen  1,000 mg Oral Q8H   aspirin EC  81 mg Oral Daily   enoxaparin (LOVENOX) injection  40 mg Subcutaneous Q24H   ezetimibe  10 mg Oral Daily   lisinopril  5 mg Oral Daily   multivitamin with minerals  1 tablet Oral Daily   Ensure Max Protein  11 oz Oral QHS   rosuvastatin  5 mg Oral Once per day on Mon Thu   Continuous Infusions:  lactated ringers 100 mL/hr at 01/26/21 1900   sodium chloride       LOS: 2 days    Time spent: over 30 min    Fayrene Helper, MD [facility]   To contact the attending provider between 7A-7P or the covering provider during after hours 7P-7A, please log into the web site [email or web address] and access using universal  [place] password for that web site. If you do not have the password, please call the hospital operator.  01/26/2021, 8:51 PM

## 2021-01-26 NOTE — Progress Notes (Signed)
   Subjective:  Pain improved. Overall he states that the leg feels better. Minimal output from vac this AM.  Objective:   VITALS:   Vitals:   01/24/21 2143 01/25/21 0819 01/25/21 0827 01/25/21 2243  BP: 123/68 124/65 114/72 117/64  Pulse: 71 65 64 64  Resp: 18 18 19 18   Temp: (!) 97.5 F (36.4 C) 98.4 F (36.9 C) 97.7 F (36.5 C) 98.2 F (36.8 C)  TempSrc: Oral Oral Oral   SpO2: 98% 99% 99% 100%  Weight:      Height:        Wound VAC in place over the right wounds with clean dressings.  The wound VAC canister is approximately three quarters full with serosanguineous fluid.  His right foot is warm and well-perfused.  Again hard to palpate DP due to the swelling over the right foot.  All toes have less than 2-second cap refill.  He is able to dorsiflex at the right ankle and extend all of his right toes against resistance.  Full strength in plantar flexion.  Sensation is intact and equal to the contralateral side in all distributions of the right foot   Lab Results  Component Value Date   WBC 11.4 (H) 01/26/2021   HGB 12.0 (L) 01/26/2021   HCT 36.9 (L) 01/26/2021   MCV 88.5 01/26/2021   PLT 217 01/26/2021     Assessment/Plan:  2 Days Post-Op   - Please make NPO at MN in preparation for OR, right leg wound debridement and closure on 9/28 - Plan for PT consult following wound closure on Wednesday - may resume anticoagulation at that time - DVT ppx - SCDs, ambulation, - Postoperative Abx: Ancef x 2 additional doses given - WBAT operative extremity - Pain control - multimodal pain management, ATC acetaminophen in conjunction with as needed narcotic (oxycodone), although this should be minimized with other modalities  -Patient is on Lovenox for DVT prophylaxis, will resume home blood thinners following wound closure  [name] 01/26/2021, 7:43 AM

## 2021-01-27 ENCOUNTER — Encounter (HOSPITAL_COMMUNITY)
Admission: EM | Disposition: A | Discharge status: Skilled Nursing Facility | Payer: Self-pay | Source: Home / Self Care | Attending: Urology

## 2021-01-27 ENCOUNTER — Inpatient Hospital Stay (HOSPITAL_COMMUNITY): Payer: Medicare Other | Admitting: Certified Registered"

## 2021-01-27 DIAGNOSIS — T79A21D Traumatic compartment syndrome of right lower extremity, subsequent encounter: Secondary | ICD-10-CM | POA: Diagnosis not present

## 2021-01-27 HISTORY — PX: I & D EXTREMITY: SHX5045

## 2021-01-27 LAB — CBC
HCT: 36.5 % — ABNORMAL LOW (ref 39.0–52.0)
Hemoglobin: 11.8 g/dL — ABNORMAL LOW (ref 13.0–17.0)
MCH: 28.9 pg (ref 26.0–34.0)
MCHC: 32.3 g/dL (ref 30.0–36.0)
MCV: 89.2 fL (ref 80.0–100.0)
Platelets: 241 10*3/uL (ref 150–400)
RBC: 4.09 MIL/uL — ABNORMAL LOW (ref 4.22–5.81)
RDW: 14.6 % (ref 11.5–15.5)
WBC: 9.4 10*3/uL (ref 4.0–10.5)
nRBC: 0 % (ref 0.0–0.2)

## 2021-01-27 LAB — SURGICAL PCR SCREEN
MRSA, PCR: NEGATIVE
Staphylococcus aureus: NEGATIVE

## 2021-01-27 LAB — BASIC METABOLIC PANEL
Anion gap: 9 (ref 5–15)
BUN: 24 mg/dL — ABNORMAL HIGH (ref 8–23)
CO2: 25 mmol/L (ref 22–32)
Calcium: 9.4 mg/dL (ref 8.9–10.3)
Chloride: 101 mmol/L (ref 98–111)
Creatinine, Ser: 1.5 mg/dL — ABNORMAL HIGH (ref 0.61–1.24)
GFR, Estimated: 48 mL/min — ABNORMAL LOW (ref >60)
Glucose, Bld: 88 mg/dL (ref 70–99)
Potassium: 4.2 mmol/L (ref 3.5–5.1)
Sodium: 135 mmol/L (ref 135–145)

## 2021-01-27 LAB — PHOSPHORUS: Phosphorus: 4.3 mg/dL (ref 2.5–4.6)

## 2021-01-27 LAB — MAGNESIUM: Magnesium: 2 mg/dL (ref 1.7–2.4)

## 2021-01-27 SURGERY — IRRIGATION AND DEBRIDEMENT EXTREMITY
Anesthesia: General | Site: Leg Lower | Laterality: Right

## 2021-01-27 MED ORDER — HYDRALAZINE HCL 20 MG/ML IJ SOLN
10.0000 mg | INTRAMUSCULAR | Status: DC | PRN
Start: 2021-01-27 — End: 2021-02-11

## 2021-01-27 MED ORDER — OXYCODONE HCL 5 MG/5ML PO SOLN
5.0000 mg | Freq: Once | ORAL | Status: DC | PRN
Start: 2021-01-27 — End: 2021-01-27

## 2021-01-27 MED ORDER — FENTANYL CITRATE (PF) 250 MCG/5ML IJ SOLN
INTRAMUSCULAR | Status: DC | PRN
  Administered 2021-01-27 (×2): 50 ug via INTRAVENOUS
  Administered 2021-01-27: 25 ug via INTRAVENOUS

## 2021-01-27 MED ORDER — ALBUMIN HUMAN 5 % IV SOLN
INTRAVENOUS | Status: AC
  Filled 2021-01-27: qty 250

## 2021-01-27 MED ORDER — PROPOFOL 10 MG/ML IV BOLUS
INTRAVENOUS | Status: AC
  Filled 2021-01-27: qty 20

## 2021-01-27 MED ORDER — LIDOCAINE 2% (20 MG/ML) 5 ML SYRINGE
INTRAMUSCULAR | Status: DC | PRN
  Administered 2021-01-27: 60 mg via INTRAVENOUS

## 2021-01-27 MED ORDER — EPHEDRINE 5 MG/ML INJ
15.0000 mg | Freq: Once | INTRAVENOUS | Status: AC
  Administered 2021-01-27: 15 mg via INTRAVENOUS

## 2021-01-27 MED ORDER — TRANEXAMIC ACID-NACL 1000-0.7 MG/100ML-% IV SOLN
1000.0000 mg | INTRAVENOUS | Status: AC
  Administered 2021-01-27: 1000 mg via INTRAVENOUS

## 2021-01-27 MED ORDER — FENTANYL CITRATE (PF) 100 MCG/2ML IJ SOLN
INTRAMUSCULAR | Status: AC
  Filled 2021-01-27: qty 2

## 2021-01-27 MED ORDER — DEXAMETHASONE SODIUM PHOSPHATE 10 MG/ML IJ SOLN
INTRAMUSCULAR | Status: DC | PRN
  Administered 2021-01-27: 8 mg via INTRAVENOUS

## 2021-01-27 MED ORDER — CEFAZOLIN SODIUM-DEXTROSE 2-4 GM/100ML-% IV SOLN
2.0000 g | Freq: Three times a day (TID) | INTRAVENOUS | Status: AC
Start: 2021-01-27 — End: 2021-01-28
  Administered 2021-01-27 (×2): 2 g via INTRAVENOUS
  Filled 2021-01-27 (×2): qty 100

## 2021-01-27 MED ORDER — OXYCODONE HCL 5 MG PO TABS
10.0000 mg | ORAL_TABLET | ORAL | Status: DC | PRN
  Administered 2021-01-27 – 2021-02-11 (×36): 10 mg via ORAL
  Filled 2021-01-27 (×40): qty 2

## 2021-01-27 MED ORDER — IPRATROPIUM-ALBUTEROL 0.5-2.5 (3) MG/3ML IN SOLN
3.0000 mL | RESPIRATORY_TRACT | Status: DC | PRN
  Administered 2021-01-31 – 2021-02-01 (×2): 3 mL via RESPIRATORY_TRACT
  Filled 2021-01-27 (×2): qty 3

## 2021-01-27 MED ORDER — CEFAZOLIN SODIUM-DEXTROSE 2-4 GM/100ML-% IV SOLN
2.0000 g | Freq: Three times a day (TID) | INTRAVENOUS | Status: AC
  Administered 2021-01-27: 2 g via INTRAVENOUS

## 2021-01-27 MED ORDER — PROPOFOL 10 MG/ML IV BOLUS
INTRAVENOUS | Status: DC | PRN
  Administered 2021-01-27: 150 mg via INTRAVENOUS

## 2021-01-27 MED ORDER — ONDANSETRON HCL 4 MG/2ML IJ SOLN
INTRAMUSCULAR | Status: DC | PRN
  Administered 2021-01-27: 4 mg via INTRAVENOUS

## 2021-01-27 MED ORDER — EPHEDRINE 5 MG/ML INJ
INTRAVENOUS | Status: AC
  Filled 2021-01-27: qty 5

## 2021-01-27 MED ORDER — ONDANSETRON HCL 4 MG/2ML IJ SOLN
4.0000 mg | Freq: Four times a day (QID) | INTRAMUSCULAR | Status: AC | PRN
  Administered 2021-01-27: 4 mg via INTRAVENOUS

## 2021-01-27 MED ORDER — METOPROLOL TARTRATE 5 MG/5ML IV SOLN: 5.0000 mg | INTRAVENOUS | Status: DC | PRN

## 2021-01-27 MED ORDER — SODIUM CHLORIDE 0.9 % IR SOLN
Status: DC | PRN
  Administered 2021-01-27: 3000 mL
  Administered 2021-01-27: 2000 mL

## 2021-01-27 MED ORDER — SODIUM CHLORIDE 0.9 % IV BOLUS
500.0000 mL | Freq: Once | INTRAVENOUS | Status: AC
  Administered 2021-01-27: 500 mL via INTRAVENOUS

## 2021-01-27 MED ORDER — OXYCODONE HCL 5 MG PO TABS: 5.0000 mg | ORAL_TABLET | Freq: Once | ORAL | Status: DC | PRN

## 2021-01-27 MED ORDER — FENTANYL CITRATE (PF) 250 MCG/5ML IJ SOLN
INTRAMUSCULAR | Status: AC
  Filled 2021-01-27: qty 5

## 2021-01-27 MED ORDER — FENTANYL CITRATE (PF) 100 MCG/2ML IJ SOLN
25.0000 ug | INTRAMUSCULAR | Status: DC | PRN
  Administered 2021-01-27: 25 ug via INTRAVENOUS
  Administered 2021-01-27: 50 ug via INTRAVENOUS
  Administered 2021-01-27 (×2): 25 ug via INTRAVENOUS

## 2021-01-27 MED ORDER — GABAPENTIN 100 MG PO CAPS
200.0000 mg | ORAL_CAPSULE | Freq: Two times a day (BID) | ORAL | Status: DC
  Administered 2021-01-27 – 2021-01-29 (×5): 200 mg via ORAL
  Filled 2021-01-27 (×5): qty 2

## 2021-01-27 MED ORDER — CEFAZOLIN SODIUM-DEXTROSE 2-4 GM/100ML-% IV SOLN
INTRAVENOUS | Status: AC
  Filled 2021-01-27: qty 100

## 2021-01-27 MED ORDER — EPHEDRINE SULFATE 50 MG/ML IJ SOLN: 15.0000 mg | Freq: Once | INTRAMUSCULAR | Status: DC

## 2021-01-27 MED ORDER — 0.9 % SODIUM CHLORIDE (POUR BTL) OPTIME
TOPICAL | Status: DC | PRN
  Administered 2021-01-27: 1000 mL

## 2021-01-27 MED ORDER — TRANEXAMIC ACID-NACL 1000-0.7 MG/100ML-% IV SOLN
INTRAVENOUS | Status: AC
  Filled 2021-01-27: qty 100

## 2021-01-27 MED ORDER — ONDANSETRON HCL 4 MG/2ML IJ SOLN
INTRAMUSCULAR | Status: AC
  Filled 2021-01-27: qty 2

## 2021-01-27 MED ORDER — EPHEDRINE SULFATE-NACL 50-0.9 MG/10ML-% IV SOSY
PREFILLED_SYRINGE | INTRAVENOUS | Status: DC | PRN
  Administered 2021-01-27 (×2): 5 mg via INTRAVENOUS

## 2021-01-27 SURGICAL SUPPLY — 45 items
BAG COUNTER SPONGE SURGICOUNT (BAG) ×2 IMPLANT
BNDG COHESIVE 4X5 TAN STRL (GAUZE/BANDAGES/DRESSINGS) ×2 IMPLANT
BNDG COHESIVE 6X5 TAN STRL LF (GAUZE/BANDAGES/DRESSINGS) ×4 IMPLANT
BNDG ELASTIC 3X5.8 VLCR STR LF (GAUZE/BANDAGES/DRESSINGS) IMPLANT
BNDG GAUZE ELAST 4 BULKY (GAUZE/BANDAGES/DRESSINGS) ×2 IMPLANT
COVER SURGICAL LIGHT HANDLE (MISCELLANEOUS) ×2 IMPLANT
DRAPE DERMATAC (DRAPES) ×7 IMPLANT
DRAPE U-SHAPE 47X51 STRL (DRAPES) ×2 IMPLANT
DRSG ADAPTIC 3X8 NADH LF (GAUZE/BANDAGES/DRESSINGS) ×6 IMPLANT
DRSG PAD ABDOMINAL 8X10 ST (GAUZE/BANDAGES/DRESSINGS) ×5 IMPLANT
ELECT REM PT RETURN 9FT ADLT (ELECTROSURGICAL)
ELECTRODE REM PT RTRN 9FT ADLT (ELECTROSURGICAL) IMPLANT
GAUZE SPONGE 4X4 12PLY STRL (GAUZE/BANDAGES/DRESSINGS) ×4 IMPLANT
GAUZE XEROFORM 5X9 LF (GAUZE/BANDAGES/DRESSINGS) ×1 IMPLANT
GLOVE SURG LTX SZ7 (GLOVE) ×2 IMPLANT
GLOVE SURG SYN 8.0 (GLOVE) ×4 IMPLANT
GLOVE SURG SYN 8.0 PF PI (GLOVE) IMPLANT
GOWN STRL REIN XL XLG (GOWN DISPOSABLE) ×4 IMPLANT
GRAFT MYRIAD 3 LAYER 5X5 (Graft) ×1 IMPLANT
KIT BASIN OR (CUSTOM PROCEDURE TRAY) ×2 IMPLANT
KIT TURNOVER KIT B (KITS) ×2 IMPLANT
MANIFOLD NEPTUNE II (INSTRUMENTS) ×2 IMPLANT
PACK ORTHO EXTREMITY (CUSTOM PROCEDURE TRAY) ×2 IMPLANT
PAD ARMBOARD 7.5X6 YLW CONV (MISCELLANEOUS) ×4 IMPLANT
PADDING CAST ABS 4INX4YD NS (CAST SUPPLIES) ×1
PADDING CAST ABS COTTON 4X4 ST (CAST SUPPLIES) ×1 IMPLANT
PADDING CAST COTTON 6X4 STRL (CAST SUPPLIES) ×2 IMPLANT
PREVENA RESTOR AXIOFORM 29X28 (GAUZE/BANDAGES/DRESSINGS) ×1 IMPLANT
SET IRRIG Y TYPE TUR BLADDER L (SET/KITS/TRAYS/PACK) ×1 IMPLANT
SPONGE T-LAP 18X18 ~~LOC~~+RFID (SPONGE) ×4 IMPLANT
STAPLER VISISTAT 35W (STAPLE) ×2 IMPLANT
STOCKINETTE IMPERVIOUS 9X36 MD (GAUZE/BANDAGES/DRESSINGS) ×2 IMPLANT
SUT ETHILON 2 0 PSLX (SUTURE) ×5 IMPLANT
SUT ETHILON 3 0 FSL (SUTURE) ×4 IMPLANT
SUT VIC AB 0 CT1 27 (SUTURE) ×8
SUT VIC AB 0 CT1 27XBRD ANBCTR (SUTURE) IMPLANT
SUT VIC AB 2-0 CT1 27 (SUTURE) ×6
SUT VIC AB 2-0 CT1 TAPERPNT 27 (SUTURE) IMPLANT
SWAB CULTURE ESWAB REG 1ML (MISCELLANEOUS) IMPLANT
TOWEL GREEN STERILE (TOWEL DISPOSABLE) ×2 IMPLANT
TOWEL GREEN STERILE FF (TOWEL DISPOSABLE) ×2 IMPLANT
TUBE CONNECTING 12X1/4 (SUCTIONS) ×2 IMPLANT
UNDERPAD 30X36 HEAVY ABSORB (UNDERPADS AND DIAPERS) ×4 IMPLANT
WATER STERILE IRR 1000ML POUR (IV SOLUTION) ×2 IMPLANT
YANKAUER SUCT BULB TIP NO VENT (SUCTIONS) ×1 IMPLANT

## 2021-01-27 NOTE — Progress Notes (Signed)
Nutrition Follow-up  DOCUMENTATION CODES:   Morbid obesity  INTERVENTION:   -Continue Ensure Max po daily, each supplement provides 150 kcal and 30 grams of protein.   -Continue MVI with minerals daily -Continue double protein portions with meals -Continue Magic cup TID with meals, each supplement provides 290 kcal and 9 grams of protein   NUTRITION DIAGNOSIS:   Increased nutrient needs related to wound healing, post-op healing as evidenced by estimated needs.  Ongoing  GOAL:   Patient will meet greater than or equal to 90% of their needs  Progressing   MONITOR:   PO intake, Supplement acceptance, Diet advancement, Labs, Weight trends, Skin, I & O's  REASON FOR ASSESSMENT:   Consult Assessment of nutrition requirement/status, Hip fracture protocol  ASSESSMENT:   [name] is a 75 y.o. male who presents status post hitting his right leg in a car door at approximately 730 this morning.  He states that he did not initially have pain although developed pain in the ensuing hours.  He proceeded to the [facility] and subsequently was evaluated.  CT scan showed a large hematoma in the posterior gastrocs.  He is subsequently consulted me to discuss possible compartment syndrome.  I requested emergent [facility] for [facility] evaluation.  I saw the patient immediately upon her arrival and believe the patient has compartment syndrome.  Of note he is on Ticagrelor for his cardiac disease.  Per the EMS he did develop significant blistering of the leg in route.  9/25- s/p PROCEDURE: 1.  Right lower leg 4 compartment fasciotomy 2.  Stryker pressure measurements of right anterior, lateral, superficial posterior, deep posterior compartments 3.  Application of deep wound VAC wound greater than 50 cm 9/28- s/p PROCEDURE: 1.  Irrigation and debridement bone fascia muscle and subcutaneous tissue of anterior lateral wound measuring 20 x 5 cm. 2. Irrigation and  debridement bone fascia muscle and subcutaneous tissue of posteromedial wound measuring 20 x 5 cm. 3. Application of allograft dermal skin matrix 5x5 cm anterolateral wound 4. Application of wound vac area > 50 cm  Reviewed I/O's: +364 ml x 24 hours and +674 ml since admission  UOP: 725 ml x 24 hours  Drain output: 125 ml x 24 hours   Pt unavailable at time of visit. Attempted to speak with pt via call to hospital room phone, however, unable to reach.   Pt with good appetite. Noted meal completions 100%. He is taking Ensure Max supplements  Labs reviewed.   Diet Order:   Diet Order             Diet Heart Room service appropriate? Yes; Fluid consistency: Thin  Diet effective now                   EDUCATION NEEDS:   No education needs have been identified at this time  Skin:  Skin Assessment: Skin Integrity Issues: Skin Integrity Issues:: Wound VAC Wound Vac: rt leg s/p fasciostomy  Last BM:  Unknown  Height:   Ht Readings from Last 1 Encounters:  01/24/21 5\' 8"  (1.727 m)    Weight:   Wt Readings from Last 1 Encounters:  01/24/21 119.7 kg    Ideal Body Weight:  67.3 kg  BMI:  Body mass index is 40.14 kg/m.  Estimated Nutritional Needs:   Kcal:  0174-9449  Protein:  125-140 grams  Fluid:  > 2 L    Loistine Chance, RD, LDN, [place] Registered Dietitian II Certified  Diabetes Care and Education Specialist Please refer to [facility] for RD and/or RD on-call/weekend/after hours pager

## 2021-01-27 NOTE — TOC Progression Note (Signed)
Transition of Care [facility]) - Initial/Assessment Note    Patient Details  Name: [name] MRN: [id number] Date of Birth: 1946/02/08  Transition of Care [facility]) CM/SW Contact:    [name], [place] Phone Number: 01/27/2021, 3:36 PM  Clinical Narrative:                 TOC following patient for any d/c planning needs once medically stable.  CSW completed chart review.  Patient had IRRIGATION AND DEBRIDEMENT RIGHT LOWER EXTREMITY today.  Attending is anticipating d/c home. PT/OT recommendations pending.  Lind Covert, MSW, LCSWA         Patient Goals and CMS Choice        Expected Discharge Plan and Services                                                Prior Living Arrangements/Services                       Activities of Daily Living Home Assistive Devices/Equipment: None ADL Screening (condition at time of admission) Patient's cognitive ability adequate to safely complete daily activities?: Yes Is the patient deaf or have difficulty hearing?: No Does the patient have difficulty seeing, even when wearing glasses/contacts?: No Does the patient have difficulty concentrating, remembering, or making decisions?: No Patient able to express need for assistance with ADLs?: Yes Does the patient have difficulty dressing or bathing?: No Independently performs ADLs?: Yes (appropriate for developmental age) Does the patient have difficulty walking or climbing stairs?: No Weakness of Legs: None Weakness of Arms/Hands: None  Permission Sought/Granted                  Emotional Assessment              Admission diagnosis:  Hematoma [T14.8XXA] Compartment syndrome ([place]) [T79.A0XA] Patient Active Problem List   Diagnosis Date Noted   Hematoma    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   Myalgia due to statin 03/12/2020   Coronary artery disease due to lipid rich plaque    Non-ST elevation (NSTEMI) myocardial infarction [facility])     Hypertensive emergency    Elevated troponin    Leg edema    Nephrolithiasis 08/04/2019   AKI (acute kidney injury) ([place]) 08/04/2019   Hypokalemia 08/04/2019   infected Renal calculus, left 02/04/2019   PCP:  [name], MD Pharmacy:   [place], [place] - [address] [place] [address] [place] [address] Phone: [phone] Fax: [place], [address] [place] [address] Phone: [phone] Fax: [phone]     Social Determinants of Health (SDOH) Interventions    Readmission Risk Interventions No flowsheet data found.

## 2021-01-27 NOTE — Progress Notes (Signed)
Patient arrived to unit at 1045. VSS. C/o pain, given PRN meds. Tolerating diet. DTV. No current complaints at this time

## 2021-01-27 NOTE — Anesthesia Preprocedure Evaluation (Signed)
Anesthesia Evaluation  Patient identified by MRN, date of birth, ID band Patient awake    Reviewed: Allergy & Precautions, H&P , NPO status , Patient's Chart, lab work & pertinent test results  Airway Mallampati: II   Neck ROM: full    Dental   Pulmonary former smoker,    breath sounds clear to auscultation       Cardiovascular hypertension, + CAD, + Past MI, + Cardiac Stents and +CHF   Rhythm:regular Rate:Normal  TTE (2021): EF 45-50%   Neuro/Psych    GI/Hepatic   Endo/Other  Morbid obesity  Renal/GU Renal InsufficiencyRenal disease     Musculoskeletal   Abdominal   Peds  Hematology   Anesthesia Other Findings   Reproductive/Obstetrics                             Anesthesia Physical Anesthesia Plan  ASA: 3  Anesthesia Plan: General   Post-op Pain Management:    Induction: Intravenous  PONV Risk Score and Plan: 2 and Ondansetron, Dexamethasone and Treatment may vary due to age or medical condition  Airway Management Planned: LMA  Additional Equipment:   Intra-op Plan:   Post-operative Plan: Extubation in OR  Informed Consent: I have reviewed the patients History and Physical, chart, labs and discussed the procedure including the risks, benefits and alternatives for the proposed anesthesia with the patient or authorized representative who has indicated his/her understanding and acceptance.     Dental advisory given  Plan Discussed with: CRNA, Anesthesiologist and Surgeon  Anesthesia Plan Comments:         Anesthesia Quick Evaluation

## 2021-01-27 NOTE — Interval H&P Note (Signed)
History and Physical Interval Note:  01/27/2021 7:06 AM  [name]  has presented today for surgery, with the diagnosis of right leg fasciotomy.  The various methods of treatment have been discussed with the patient and family. After consideration of risks, benefits and other options for treatment, the patient has consented to  Procedure(s): IRRIGATION AND DEBRIDEMENT EXTREMITY (Right) as a surgical intervention.  The patient's history has been reviewed, patient examined, no change in status, stable for surgery.  I have reviewed the patient's chart and labs.  Questions were answered to the patient's satisfaction.     [name]

## 2021-01-27 NOTE — Op Note (Signed)
Date of Surgery: 01/27/2021  INDICATIONS: [name] is a 75 y.o.-year-old male with with right leg compartment syndrome who previously underwent 4 compartment fasciotomy on September 25.  He is here today for second look at his fasciotomies and skin closure.  Emergent transfer was obtained on concern for compartment syndrome.  The risk and benefits of the procedure with discussed in detail and documented in the pre-operative evaluation.  PREOPERATIVE DIAGNOSIS: 1. Right lower leg compartment syndrome status post 4 compartment fasciotomy 2.  20 x 5 cm anterior lateral wound right leg 3.  20 x 5 cm posterior medial wound right leg  POSTOPERATIVE DIAGNOSIS: Same.  PROCEDURE: 1.  Irrigation and debridement bone fascia muscle and subcutaneous tissue of anterior lateral wound measuring 20 x 5 cm. 2. Irrigation and debridement bone fascia muscle and subcutaneous tissue of posteromedial wound measuring 20 x 5 cm. 3. Application of allograft dermal skin matrix 5x5 cm anterolateral wound 4. Application of wound vac area > 50 cm   SURGEON: Yevonne Pax MD  ANESTHESIA:  general  IV FLUIDS AND URINE: See anesthesia record.  ANTIBIOTICS: Ancef 2 g  ESTIMATED BLOOD LOSS: 50 mL.  IMPLANTS:  Implant Name Type Inv. Item Serial No. Manufacturer Lot No. LRB No. Used Action  GRAFT MYRIAD 3 LAYER 5X5 - [id number] Graft GRAFT MYRIAD 3 LAYER 5X5  [employer] [id number] Right 1 Implanted    DRAINS: None  CULTURES: None  COMPLICATIONS: none  DESCRIPTION OF PROCEDURE:  The patient was identified in the preoperative holding area.  The right side was identified as the correct side according to universal protocol timeout nurses.  The patient was subsequently taken back to the operating room and anesthesia was induced.   The patient was then prepped and draped in the usual sterile fashion.  We began with our anterior lateral incision.  This measured 20 x 5 cm.  The wound was  thoroughly irrigated with 3 L of normal saline.  We inspected all of the tissue.  Any nonviable tissue including subcutaneous tissue muscle fascia was sharply excised with Metzenbaum scissors.  Hemostasis was achieved utilizing electrocautery.  After this we had achieved good hemostasis with well-appearing wounds.  The patient was then prepped and draped in the usual sterile fashion.  We then turned our attention to the posterior medial incision.  This measured 20 x 5 cm.  The wound was thoroughly irrigated with 3 L of normal saline.  We inspected all of the tissue.  Any nonviable tissue including subcutaneous tissue muscle fascia was sharply excised with Metzenbaum scissors.  Hemostasis was achieved utilizing electrocautery.  After this we had achieved good hemostasis with well-appearing wounds.  The wound was closed in layers using 0 Vicryl 2-0 Vicryl and staples.  This was able to be achieved without excessive tension.  We then turned our attention to the anterior lateral wound.  Marginal convergence was used at the proximal and distal aspects of the wound using a series of 0 Vicryl 2-0 Vicryl and staples.  There is a 1 central 5 x 5 cm area of tissue that cannot be closed.  As such the decision was made to use allograft skin matrix on the small area.  This was stapled in place.  A wound VAC was then placed over this area which in total measured over 50 cm with both incisions.  A Kerlix was loosely wrapped around the leg with ABDs.  All counts were correct at the end of the case patient  was awoken taken to the postoperative unit.     POSTOPERATIVE PLAN: The patient will be weightbearing as tolerated.  I will leave this wound vacuum in place for a total of 1 week.  I will perform a wound check in 1 week.  He may begin PT I would like to hold off on his anticoagulation for an additional 2 days.  Yevonne Pax, MD 9:14 AM

## 2021-01-27 NOTE — Progress Notes (Signed)
Pt HR dropped to 30's and became hypotensive 48/20 with associated nausea. Dr. Marcie Bal at bedside, 15 mg ephedrine given and fluids opened up. Vital signs responsive and patient is stable at this time. Will continue to monitor.   Gabriel Rainwater, RN

## 2021-01-27 NOTE — Progress Notes (Signed)
   Subjective:  Pain improved. NPO for OR.  Objective:   VITALS:   Vitals:   01/25/21 2243 01/26/21 0800 01/26/21 1524 01/26/21 2100  BP: 117/64 119/60 (!) 148/58 124/60  Pulse: 64 69 80 70  Resp: 18 14 16    Temp: 98.2 F (36.8 C) 98.8 F (37.1 C) 97.7 F (36.5 C) 98.8 F (37.1 C)  TempSrc:  Oral Oral Oral  SpO2: 100% 100% 99% 98%  Weight:      Height:        Wound VAC in place over the right wounds with clean dressings.  The wound VAC canister is approximately three quarters full with serosanguineous fluid.  His right foot is warm and well-perfused.  Again hard to palpate DP due to the swelling over the right foot.  All toes have less than 2-second cap refill.  He is able to dorsiflex at the right ankle and extend all of his right toes against resistance.  Full strength in plantar flexion.  Sensation is intact and equal to the contralateral side in all distributions of the right foot   Lab Results  Component Value Date   WBC 9.4 01/27/2021   HGB 11.8 (L) 01/27/2021   HCT 36.5 (L) 01/27/2021   MCV 89.2 01/27/2021   PLT 241 01/27/2021     Assessment/Plan:  Day of Surgery   -Plan for Or for right leg wound debridement and closure today, discussed the possibility of skin grafting if we are unable to close both wounds - Plan for PT consult following wound closure on Wednesday - may resume anticoagulation at that time - DVT ppx - SCDs, ambulation, - Postoperative Abx: Ancef x 2 additional doses given - WBAT operative extremity - Pain control - multimodal pain management, ATC acetaminophen in conjunction with as needed narcotic (oxycodone), although this should be minimized with other modalities  -Patient is on Lovenox for DVT prophylaxis, will resume home blood thinners following wound closure  Babs Dabbs 01/27/2021, 7:04 AM

## 2021-01-27 NOTE — Plan of Care (Signed)
Pain controlled. Vitals stable. Refused OOB. Prevena making sounds that it is not holding suction, provider aware, nothing done at the moment plan is to leave the machine as is and monitor.   Problem: Clinical Measurements: Goal: Ability to maintain clinical measurements within normal limits will improve Outcome: Progressing Goal: Will remain free from infection Outcome: Progressing Goal: Diagnostic test results will improve Outcome: Progressing Goal: Respiratory complications will improve Outcome: Progressing Goal: Cardiovascular complication will be avoided Outcome: Progressing   Problem: Pain Managment: Goal: General experience of comfort will improve Outcome: Progressing   Problem: Safety: Goal: Ability to remain free from injury will improve Outcome: Progressing   Problem: Elimination: Goal: Will not experience complications related to bowel motility Outcome: Progressing Goal: Will not experience complications related to urinary retention Outcome: Progressing

## 2021-01-27 NOTE — Transfer of Care (Signed)
Immediate Anesthesia Transfer of Care Note  Patient: [name]  Procedure(s) Performed: IRRIGATION AND DEBRIDEMENT RIGHT LOWER EXTREMITY (Right: Leg Lower)  Patient Location: PACU  Anesthesia Type:General  Level of Consciousness: awake, alert  and oriented  Airway & Oxygen Therapy: Patient Spontanous Breathing and Patient connected to face mask oxygen  Post-op Assessment: Report given to RN and Post -op Vital signs reviewed and stable  Post vital signs: Reviewed and stable  Last Vitals:  Vitals Value Taken Time  BP    Temp    Pulse    Resp    SpO2      Last Pain:  Vitals:   01/27/21 0349  TempSrc:   PainSc: 8       Patients Stated Pain Goal: 3 (77/93/90 3009)  Complications: No notable events documented.

## 2021-01-27 NOTE — Progress Notes (Signed)
PROGRESS NOTE    [name]  LTJ:[id number] DOB: 06-07-1945 DOA: 01/24/2021 PCP: [name], MD   Brief Narrative:  75 y.o. 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  In the hospital he was diagnosed with right lower extremity compartment syndrome and underwent emergent fasciotomy.  He is status post OR again on 0/76 for I&D, application of dermal skin graft and wound VAC placement.   Assessment & Plan:   Active Problems:   Compartment syndrome (HCC)   Hematoma     Right lower extremity compartment syndrome -Status post emergent fasciotomy on 9/25 with repeat I&D and skin grafting with wound VAC placement on 9/28.  Increase his pain medications today.  Continue bowel regimen.  Will need outpatient follow-up and wound care management per orthopedic. - Gabapentin 200 mg twice daily   Syncope -This is resolved.   CKD IIIa -Creatinine is stable and at baseline 1.5   CAD with stenting Antiplatelets are to be held at least 48 hours postoperatively.  Earlier she can be restarted as 01/29/2021 per orthopedic   HTN -Lisinopril 5 mg daily   Dyslipidemia -Daily Zetia    DVT prophylaxis: Lovenox Code Status: Full code Family Communication:    Status is: Inpatient  Remains inpatient appropriate because:Inpatient level of care appropriate due to severity of illness  Dispo: The patient is from: Home              Anticipated d/c is to: Home              Patient currently is not medically stable to d/c.   Difficult to place patient No       Nutritional status  Nutrition Problem: Increased nutrient needs Etiology: wound healing, post-op healing  Signs/Symptoms: estimated needs  Interventions: MVI, Refer to RD note for recommendations, Magic cup, Premier Protein  Body mass index is 40.14  kg/m.           Subjective: Postoperatively patient is still having right lower extremity pain and discomfort.  Tells me it is relatively better compared to what it was.  Most of his pain he describes as burning.  Review of Systems Otherwise negative except as per HPI, including: General: Denies fever, chills, night sweats or unintended weight loss. Resp: Denies cough, wheezing, shortness of breath. Cardiac: Denies chest pain, palpitations, orthopnea, paroxysmal nocturnal dyspnea. GI: Denies abdominal pain, nausea, vomiting, diarrhea or constipation GU: Denies dysuria, frequency, hesitancy or incontinence MS: Denies muscle aches, joint pain or swelling Neuro: Denies headache, neurologic deficits (focal weakness, numbness, tingling), abnormal gait Psych: Denies anxiety, depression, SI/HI/AVH Skin: Denies new rashes or lesions ID: Denies sick contacts, exotic exposures, travel  Examination:  General exam: Appears calm and comfortable  Respiratory system: Clear to auscultation. Respiratory effort normal. Cardiovascular system: S1 & S2 heard, RRR. No JVD, murmurs, rubs, gallops or clicks. No pedal edema. Gastrointestinal system: Abdomen is nondistended, soft and nontender. No organomegaly or masses felt. Normal bowel sounds heard. Central nervous system: Alert and oriented. No focal neurological deficits. Extremities: Symmetric 5 x 5 power. Skin: Right lower extremity surgical dressing in place with wound VAC Psychiatry: Judgement and insight appear normal. Mood & affect appropriate.     Objective: Vitals:   01/27/21 1005 01/27/21 1020 01/27/21 1048 01/27/21 1224  BP: (!) 124/51 125/63 (!) 144/50 Marland Kitchen)  151/67  Pulse: 70 77 83 (!) 103  Resp: 12 14 16 16   Temp:  (!) 97.1 F (36.2 C) 97.6 F (36.4 C) 98 F (36.7 C)  TempSrc:   Axillary Oral  SpO2: 92% 95% 97% 100%  Weight:      Height:        Intake/Output Summary (Last 24 hours) at 01/27/2021 1241 Last data filed at  01/27/2021 0912 Gross per 24 hour  Intake 1893.63 ml  Output 860 ml  Net 1033.63 ml   Filed Weights   01/24/21 1229  Weight: 119.7 kg     Data Reviewed:   CBC: Recent Labs  Lab 01/24/21 1242 01/25/21 0445 01/26/21 0039 01/27/21 0355  WBC 9.1 13.3* 11.4* 9.4  NEUTROABS 6.8 11.0* 7.4  --   HGB 14.8 12.5* 12.0* 11.8*  HCT 44.2 38.2* 36.9* 36.5*  MCV 85.5 87.4 88.5 89.2  PLT 248 255 217 409   Basic Metabolic Panel: Recent Labs  Lab 01/24/21 1242 01/25/21 0445 01/26/21 0039 01/27/21 0355  NA 139 137 137 135  K 4.0 4.6 4.6 4.2  CL 106 103 105 101  CO2 24 26 24 25   GLUCOSE 139* 138* 120* 88  BUN 19 16 21  24*  CREATININE 1.36* 1.45* 1.72* 1.50*  CALCIUM 9.1 8.5* 9.2 9.4  MG  --   --  2.1 2.0  PHOS  --   --  3.6 4.3   GFR: Estimated Creatinine Clearance: 51.9 mL/min (A) (by C-G formula based on SCr of 1.5 mg/dL (H)). Liver Function Tests: Recent Labs  Lab 01/26/21 0039  AST 15  ALT 16  ALKPHOS 55  BILITOT 0.7  PROT 5.6*  ALBUMIN 2.9*   No results for input(s): LIPASE, AMYLASE in the last 168 hours. No results for input(s): AMMONIA in the last 168 hours. Coagulation Profile: No results for input(s): INR, PROTIME in the last 168 hours. Cardiac Enzymes: Recent Labs  Lab 01/24/21 1242  CKTOTAL 77   BNP (last 3 results) No results for input(s): PROBNP in the last 8760 hours. HbA1C: No results for input(s): HGBA1C in the last 72 hours. CBG: No results for input(s): GLUCAP in the last 168 hours. Lipid Profile: No results for input(s): CHOL, HDL, LDLCALC, TRIG, CHOLHDL, LDLDIRECT in the last 72 hours. Thyroid Function Tests: No results for input(s): TSH, T4TOTAL, FREET4, T3FREE, THYROIDAB in the last 72 hours. Anemia Panel: No results for input(s): VITAMINB12, FOLATE, FERRITIN, TIBC, IRON, RETICCTPCT in the last 72 hours. Sepsis Labs: No results for input(s): PROCALCITON, LATICACIDVEN in the last 168 hours.  Recent Results (from the past 240 hour(s))   SARS Coronavirus 2 by RT PCR (hospital order, performed in [facility] hospital lab) Nasopharyngeal Nasopharyngeal Swab     Status: None   Collection Time: 01/24/21  5:05 PM   Specimen: Nasopharyngeal Swab  Result Value Ref Range Status   SARS Coronavirus 2 NEGATIVE NEGATIVE Final    Comment: (NOTE) SARS-CoV-2 target nucleic acids are NOT DETECTED.  The SARS-CoV-2 RNA is generally detectable in upper and lower respiratory specimens during the acute phase of infection. The lowest concentration of SARS-CoV-2 viral copies this assay can detect is 250 copies / mL. A negative result does not preclude SARS-CoV-2 infection and should not be used as the sole basis for treatment or other patient management decisions.  A negative result may occur with improper specimen collection / handling, submission of specimen other than nasopharyngeal swab, presence of viral mutation(s) within the areas targeted by this assay,  and inadequate number of viral copies (<250 copies / mL). A negative result must be combined with clinical observations, patient history, and epidemiological information.  Fact Sheet for Patients:   StrictlyIdeas.no  Fact Sheet for Healthcare Providers: BankingDealers.co.za  This test is not yet approved or  cleared by the [place] FDA and has been authorized for detection and/or diagnosis of SARS-CoV-2 by FDA under an Emergency Use Authorization (EUA).  This EUA will remain in effect (meaning this test can be used) for the duration of the COVID-19 declaration under Section 564(b)(1) of the Act, 21 U.S.C. section 360bbb-3(b)(1), unless the authorization is terminated or revoked sooner.  Performed at [facility], [place] [address], [place], [place] [address]   Surgical pcr screen     Status: None   Collection Time: 01/27/21  4:00 AM   Specimen: Nasal Mucosa; Nasal Swab  Result Value Ref Range Status   MRSA, PCR NEGATIVE  NEGATIVE Final   Staphylococcus aureus NEGATIVE NEGATIVE Final    Comment: (NOTE) The Xpert SA Assay (FDA approved for NASAL specimens in patients 74 years of age and older), is one component of a comprehensive surveillance program. It is not intended to diagnose infection nor to guide or monitor treatment. Performed at [facility], [place] [address], [place], [place] [address]          Radiology Studies: No results found.      Scheduled Meds:  acetaminophen  1,000 mg Oral Q8H   aspirin EC  81 mg Oral Daily   enoxaparin (LOVENOX) injection  40 mg Subcutaneous Q24H   ePHEDrine       ezetimibe  10 mg Oral Daily   fentaNYL       fentaNYL       [START ON 01/28/2021] lisinopril  5 mg Oral Daily   multivitamin with minerals  1 tablet Oral Daily   ondansetron       Ensure Max Protein  11 oz Oral QHS   rosuvastatin  5 mg Oral Once per day on Mon Thu   Continuous Infusions:   ceFAZolin (ANCEF) IV     sodium chloride     sodium chloride       LOS: 3 days   Time spent= 35 mins    Atlee Villers Arsenio Loader, MD [facility]  If 7PM-7AM, please contact night-coverage  01/27/2021, 12:41 PM

## 2021-01-27 NOTE — H&P (View-Only) (Signed)
   Subjective:  Pain improved. NPO for OR.  Objective:   VITALS:   Vitals:   01/25/21 2243 01/26/21 0800 01/26/21 1524 01/26/21 2100  BP: 117/64 119/60 (!) 148/58 124/60  Pulse: 64 69 80 70  Resp: 18 14 16    Temp: 98.2 F (36.8 C) 98.8 F (37.1 C) 97.7 F (36.5 C) 98.8 F (37.1 C)  TempSrc:  Oral Oral Oral  SpO2: 100% 100% 99% 98%  Weight:      Height:        Wound VAC in place over the right wounds with clean dressings.  The wound VAC canister is approximately three quarters full with serosanguineous fluid.  His right foot is warm and well-perfused.  Again hard to palpate DP due to the swelling over the right foot.  All toes have less than 2-second cap refill.  He is able to dorsiflex at the right ankle and extend all of his right toes against resistance.  Full strength in plantar flexion.  Sensation is intact and equal to the contralateral side in all distributions of the right foot   Lab Results  Component Value Date   WBC 9.4 01/27/2021   HGB 11.8 (L) 01/27/2021   HCT 36.5 (L) 01/27/2021   MCV 89.2 01/27/2021   PLT 241 01/27/2021     Assessment/Plan:  Day of Surgery   -Plan for Or for right leg wound debridement and closure today, discussed the possibility of skin grafting if we are unable to close both wounds - Plan for PT consult following wound closure on Wednesday - may resume anticoagulation at that time - DVT ppx - SCDs, ambulation, - Postoperative Abx: Ancef x 2 additional doses given - WBAT operative extremity - Pain control - multimodal pain management, ATC acetaminophen in conjunction with as needed narcotic (oxycodone), although this should be minimized with other modalities  -Patient is on Lovenox for DVT prophylaxis, will resume home blood thinners following wound closure  Fynn Adel 01/27/2021, 7:04 AM

## 2021-01-27 NOTE — Anesthesia Procedure Notes (Signed)
Procedure Name: LMA Insertion Date/Time: 01/27/2021 7:23 AM Performed by: Mariea Clonts, CRNA Pre-anesthesia Checklist: Patient identified, Emergency Drugs available, Suction available and Patient being monitored Patient Re-evaluated:Patient Re-evaluated prior to induction Oxygen Delivery Method: Circle System Utilized Preoxygenation: Pre-oxygenation with 100% oxygen Induction Type: IV induction Ventilation: Mask ventilation without difficulty LMA: LMA inserted LMA Size: 5.0 Number of attempts: 1 Airway Equipment and Method: Bite block Placement Confirmation: positive ETCO2 Tube secured with: Tape Dental Injury: Teeth and Oropharynx as per pre-operative assessment

## 2021-01-27 NOTE — Anesthesia Postprocedure Evaluation (Signed)
Anesthesia Post Note  Patient: [name]  Procedure(s) Performed: IRRIGATION AND DEBRIDEMENT RIGHT LOWER EXTREMITY (Right: Leg Lower)     Patient location during evaluation: PACU Anesthesia Type: General Level of consciousness: awake and alert Pain management: pain level controlled Vital Signs Assessment: post-procedure vital signs reviewed and stable Respiratory status: spontaneous breathing, nonlabored ventilation, respiratory function stable and patient connected to nasal cannula oxygen Cardiovascular status: blood pressure returned to baseline and stable Postop Assessment: no apparent nausea or vomiting Anesthetic complications: no   No notable events documented.  Last Vitals:  Vitals:   01/27/21 1005 01/27/21 1020  BP: (!) 124/51 125/63  Pulse: 70 77  Resp: 12 14  Temp:  (!) 36.2 C  SpO2: 92% 95%    Last Pain:  Vitals:   01/27/21 1020  TempSrc:   PainSc: [place]

## 2021-01-27 NOTE — Plan of Care (Signed)

## 2021-01-27 NOTE — Brief Op Note (Signed)
   Brief Op Note  Date of Surgery: 01/27/2021  Preoperative Diagnosis: right leg fasciotomy  Postoperative Diagnosis: same  Procedure: Procedure(s): IRRIGATION AND DEBRIDEMENT RIGHT LOWER EXTREMITY  Implants: Implant Name Type Inv. Item Serial No. Manufacturer Lot No. LRB No. Used Action  GRAFT MYRIAD 3 LAYER 5X5 - [id number] Graft GRAFT MYRIAD 3 LAYER 5X5  [employer] [id number] Right 1 Implanted    Surgeons: Surgeon(s): [name], MD  Anesthesia: General    Estimated Blood Loss: See anesthesia record  Complications: None  Condition to PACU: Stable  Yevonne Pax, MD 01/27/2021 9:30 AM

## 2021-01-28 ENCOUNTER — Encounter (HOSPITAL_COMMUNITY): Payer: Self-pay | Admitting: Orthopaedic Surgery

## 2021-01-28 DIAGNOSIS — T79A21D Traumatic compartment syndrome of right lower extremity, subsequent encounter: Secondary | ICD-10-CM | POA: Diagnosis not present

## 2021-01-28 LAB — CBC
HCT: 30.4 % — ABNORMAL LOW (ref 39.0–52.0)
Hemoglobin: 9.9 g/dL — ABNORMAL LOW (ref 13.0–17.0)
MCH: 28.7 pg (ref 26.0–34.0)
MCHC: 32.6 g/dL (ref 30.0–36.0)
MCV: 88.1 fL (ref 80.0–100.0)
Platelets: 246 10*3/uL (ref 150–400)
RBC: 3.45 MIL/uL — ABNORMAL LOW (ref 4.22–5.81)
RDW: 14.2 % (ref 11.5–15.5)
WBC: 13.4 10*3/uL — ABNORMAL HIGH (ref 4.0–10.5)
nRBC: 0 % (ref 0.0–0.2)

## 2021-01-28 LAB — BASIC METABOLIC PANEL
Anion gap: 7 (ref 5–15)
BUN: 24 mg/dL — ABNORMAL HIGH (ref 8–23)
CO2: 25 mmol/L (ref 22–32)
Calcium: 8.9 mg/dL (ref 8.9–10.3)
Chloride: 102 mmol/L (ref 98–111)
Creatinine, Ser: 1.61 mg/dL — ABNORMAL HIGH (ref 0.61–1.24)
GFR, Estimated: 44 mL/min — ABNORMAL LOW (ref >60)
Glucose, Bld: 137 mg/dL — ABNORMAL HIGH (ref 70–99)
Potassium: 5 mmol/L (ref 3.5–5.1)
Sodium: 134 mmol/L — ABNORMAL LOW (ref 135–145)

## 2021-01-28 LAB — MAGNESIUM: Magnesium: 2.1 mg/dL (ref 1.7–2.4)

## 2021-01-28 NOTE — Progress Notes (Signed)
   Subjective:  Pain well controlled. Tolerating diet well. Wound vac holing suction.  Objective:   VITALS:   Vitals:   01/27/21 1257 01/27/21 1544 01/27/21 2110 01/28/21 0740  BP: 135/70 131/68 107/76 107/88  Pulse: 81 91 89 64  Resp: 18 16 16 14   Temp: 97.8 F (36.6 C) 98.5 F (36.9 C) 98 F (36.7 C) 98 F (36.7 C)  TempSrc: Oral Oral Oral Oral  SpO2: 97% 93% 93% 100%  Weight:      Height:        Wound VAC in place over the right wounds with clean dressings.  The wound VAC canister is approximately three quarters full with serosanguineous fluid.  His right foot is warm and well-perfused.  Again hard to palpate DP due to the swelling over the right foot.  All toes have less than 2-second cap refill.  He is able to dorsiflex at the right ankle and extend all of his right toes against resistance.  Full strength in plantar flexion.  Sensation is intact and equal to the contralateral side in all distributions of the right foot   Lab Results  Component Value Date   WBC 13.4 (H) 01/28/2021   HGB 9.9 (L) 01/28/2021   HCT 30.4 (L) 01/28/2021   MCV 88.1 01/28/2021   PLT 246 01/28/2021     Assessment/Plan:  1 Day Post-Op   -Wound vac in place, okay for DC, f/u one week for wound check -PT consult, out of bed - DVT ppx - SCDs, ambulation, - Postoperative Abx: Ancef x 2 additional doses given - WBAT operative extremity - Pain control - multimodal pain management, ATC acetaminophen in conjunction with as needed narcotic (oxycodone), although this should be minimized with other modalities  -Patient is on Lovenox for DVT prophylaxis, may resume anticoagulation in 1 day.  Dolly Harbach 01/28/2021, 10:01 AM

## 2021-01-28 NOTE — Evaluation (Signed)
Physical Therapy Evaluation Patient Details Name: [name] MRN: [id number] DOB: 25-Dec-1945 Today's Date: 01/28/2021  History of Present Illness  75 yo male presenting to ED  on 9/25 with R calf unjiry after slamming it in a car door. S/p irrigation and debridement of RLE for compartment fasciotomy on 9/28.  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   Clinical Impression  Pt presents with RLE pain, impaired standing balance with multiple bouts of posterior bias during gait, antalgic gait, and decreased activity tolerance vs baseline. Pt to benefit from acute PT to address deficits. Pt ambulated room distance with pt overall needing min assist for initiating tasks and steadying during gait. Based on mobility during eval and the fact that pt's wife cannot assist pt, PT currently recommending ST-SNF. Pt may progress to home level, will re-assess tomorrow. PT to progress mobility as tolerated, and will continue to follow acutely.         Recommendations for follow up therapy are one component of a multi-disciplinary discharge planning process, led by the attending physician.  Recommendations may be updated based on patient status, additional functional criteria and insurance authorization.  Follow Up Recommendations SNF (vs HHPT pending pt progress)    Equipment Recommendations  Rolling walker with 5" wheels    Recommendations for Other Services       Precautions / Restrictions Precautions Precautions: Fall Restrictions Weight Bearing Restrictions: No RLE Weight Bearing: Weight bearing as tolerated      Mobility  Bed Mobility Overal bed mobility: Needs Assistance Bed Mobility: Supine to Sit     Supine to sit: Min assist;HOB elevated     General bed mobility comments: Min A to hold therapist's hand and pull into upright position, min boost from posterior trunk as well. increased time for scoot  to EOB    Transfers Overall transfer level: Needs assistance Equipment used: Rolling walker (2 wheeled) Transfers: Sit to/from Stand Sit to Stand: Min assist         General transfer comment: Min A for power up and correction of posterior lean  Ambulation/Gait Ambulation/Gait assistance: Min assist Gait Distance (Feet): 15 Feet (x2 - to and from bathroom sink) Assistive device: Rolling walker (2 wheeled) Gait Pattern/deviations: Step-through pattern;Decreased stride length;Trunk flexed Gait velocity: decr   General Gait Details: light assist to steady, guide pt/RW. Cues for placement in RW, upright posture. Posterior bias with any perturbation to balance  Stairs            Wheelchair Mobility    Modified Rankin (Stroke Patients Only)       Balance Overall balance assessment: Needs assistance Sitting-balance support: No upper extremity supported;Feet supported Sitting balance-Leahy Scale: Fair     Standing balance support: Bilateral upper extremity supported;During functional activity;Single extremity supported Standing balance-Leahy Scale: Poor Standing balance comment: Reliant on physical A and UE support                             Pertinent Vitals/Pain Pain Assessment: 0-10 Pain Score: 4  Pain Location: RLE Pain Descriptors / Indicators: Constant;Discomfort;Grimacing Pain Intervention(s): Limited activity within patient's tolerance;Monitored during session;Repositioned    Home Living Family/patient expects to be discharged to:: Private residence Living Arrangements: Spouse/significant other Available Help at Discharge: Family Type of Home: House Home Access: Stairs to enter Entrance Stairs-Rails: Left Entrance Stairs-Number of Steps: 3 Home Layout: One level  Home Equipment: Cane - single point;Crutches;Shower seat      Prior Function Level of Independence: Independent         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      Hand Dominance   Dominant Hand: Right    Extremity/Trunk Assessment   Upper Extremity Assessment Upper Extremity Assessment: Defer to OT evaluation    Lower Extremity Assessment Lower Extremity Assessment: Generalized weakness;RLE deficits/detail RLE Deficits / Details: post-operative pain and weakness; able to perform LAQ to full knee extension, seated march RLE: Unable to fully assess due to pain    Cervical / Trunk Assessment Cervical / Trunk Assessment: Other exceptions Cervical / Trunk Exceptions: increased body habitus  Communication   Communication: No difficulties  Cognition Arousal/Alertness: Awake/alert Behavior During Therapy: WFL for tasks assessed/performed Overall Cognitive Status: Within Functional Limits for tasks assessed Area of Impairment: Safety/judgement                         Safety/Judgement: Decreased awareness of deficits     General Comments: can be tangential in conversation, decreased safety awareness during mobility.      General Comments General comments (skin integrity, edema, etc.): Wound vac in place    Exercises     Assessment/Plan    PT Assessment Patient needs continued PT services  PT Problem List Decreased strength;Decreased mobility;Decreased activity tolerance;Decreased balance;Decreased knowledge of use of DME;Pain;Decreased safety awareness;Decreased range of motion       PT Treatment Interventions DME instruction;Therapeutic activities;Gait training;Therapeutic exercise;Patient/family education;Balance training;Stair training;Functional mobility training;Neuromuscular re-education    PT Goals (Current goals can be found in the Care Plan section)  Acute Rehab PT Goals Patient Stated Goal: Go home tomorrow PT Goal Formulation: With patient Time For Goal Achievement: 02/11/21 Potential to Achieve Goals: Good    Frequency Min 3X/week   Barriers to discharge        Co-evaluation PT/OT/SLP  Co-Evaluation/Treatment: Yes Reason for Co-Treatment: For patient/therapist safety;To address functional/ADL transfers PT goals addressed during session: Mobility/safety with mobility;Balance OT goals addressed during session: ADL's and self-care       AM-PAC PT "6 Clicks" Mobility  Outcome Measure Help needed turning from your back to your side while in a flat bed without using bedrails?: A Little Help needed moving from lying on your back to sitting on the side of a flat bed without using bedrails?: A Little Help needed moving to and from a bed to a chair (including a wheelchair)?: A Little Help needed standing up from a chair using your arms (e.g., wheelchair or bedside chair)?: A Little Help needed to walk in hospital room?: A Little Help needed climbing 3-5 steps with a railing? : A Lot 6 Click Score: 17    End of Session   Activity Tolerance: Patient tolerated treatment well Patient left: with call bell/phone within reach;in chair;with chair alarm set Nurse Communication: Mobility status PT Visit Diagnosis: Other abnormalities of gait and mobility (R26.89);Pain Pain - Right/Left: Right Pain - part of body: Leg    Time: 5329-9242 PT Time Calculation (min) (ACUTE ONLY): 19 min   Charges:   PT Evaluation $PT Eval Low Complexity: 1 Low          Earlyn Sylvan S, PT DPT Acute Rehabilitation Services Pager [phone]  Office [phone]   Roxine Caddy E Ruffin Pyo 01/28/2021, 12:16 PM

## 2021-01-28 NOTE — Care Management Important Message (Signed)
Important Message  Patient Details  Name: [name] MRN: [id number] Date of Birth: Nov 12, 1945   Medicare Important Message Given:  Yes     Nickolas Chalfin Montine Circle 01/28/2021, 3:16 PM

## 2021-01-28 NOTE — Progress Notes (Signed)
PROGRESS NOTE    [name]  JSH:[id number] DOB: 1945/12/01 DOA: 01/24/2021 PCP: [name], MD   Brief Narrative:  75 y.o. 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  In the hospital he was diagnosed with right lower extremity compartment syndrome and underwent emergent fasciotomy.  He is status post OR again on 8/50 for I&D, application of dermal skin graft and wound VAC placement.   Assessment & Plan:   Active Problems:   Compartment syndrome (HCC)   Hematoma     Right lower extremity compartment syndrome -Status post emergent fasciotomy on 9/25 with repeat I&D and skin grafting with wound VAC placement on 9/28.  Increase his pain medications today.  Continue bowel regimen.  Will need outpatient follow-up and wound care management per orthopedic.  Hold antiplatelets until tomorrow - Gabapentin 200 mg twice daily  Acute blood loss anemia - Baseline hemoglobin 13.0, slowly drifted down to 9.9.  Continue to monitor this   Syncope -This is resolved.   CKD IIIa -Creatinine is stable and at baseline 1.5   CAD with stenting Antiplatelets are to be held at least 48 hours postoperatively.  Earliest it can be restarted as 01/29/2021 per orthopedic   HTN -Lisinopril 5 mg daily   Dyslipidemia -Daily Zetia    DVT prophylaxis: Lovenox Code Status: Full code Family Communication:    Status is: Inpatient  Remains inpatient appropriate because:Inpatient level of care appropriate due to severity of illness.  Plans to make arrangements for wound VAC at home today and better control his pain.  Hopefully home tomorrow.  PT/OT is pending  Dispo: The patient is from: Home              Anticipated d/c is to: Home              Patient currently is not medically stable to d/c.   Difficult to place patient  No       Nutritional status  Nutrition Problem: Increased nutrient needs Etiology: wound healing, post-op healing  Signs/Symptoms: estimated needs  Interventions: MVI, Refer to RD note for recommendations, Magic cup, Premier Protein  Body mass index is 40.14 kg/m.           Subjective: Still having pain in the right lower extremity but improved compared to yesterday.  He has not tried getting up to bear weight on his right leg.  There is question of his wound VAC malfunctioning which nursing staff is addressing  Review of Systems Otherwise negative except as per HPI, including: General: Denies fever, chills, night sweats or unintended weight loss. Resp: Denies cough, wheezing, shortness of breath. Cardiac: Denies chest pain, palpitations, orthopnea, paroxysmal nocturnal dyspnea. GI: Denies abdominal pain, nausea, vomiting, diarrhea or constipation GU: Denies dysuria, frequency, hesitancy or incontinence MS: Denies muscle aches, joint pain or swelling Neuro: Denies headache, neurologic deficits (focal weakness, numbness, tingling), abnormal gait Psych: Denies anxiety, depression, SI/HI/AVH Skin: Denies new rashes or lesions ID: Denies sick contacts, exotic exposures, travel Examination:  Constitutional: Not in acute distress Respiratory: Clear to auscultation bilaterally Cardiovascular: Normal sinus rhythm, no rubs Abdomen: Nontender nondistended good bowel sounds Musculoskeletal: No edema noted Skin: Right lower extremity dressing in place at the surgical site Neurologic: CN 2-12 grossly intact.  And nonfocal Psychiatric: Normal judgment and insight. Alert and oriented x  3. Normal mood.   Objective: Vitals:   01/27/21 1257 01/27/21 1544 01/27/21 2110 01/28/21 0740  BP: 135/70 131/68 107/76 107/88  Pulse: 81 91 89 64  Resp: 18 16 16 14   Temp: 97.8 F (36.6 C) 98.5 F (36.9 C) 98 F (36.7 C) 98 F (36.7 C)  TempSrc: Oral Oral Oral Oral  SpO2: 97% 93%  93% 100%  Weight:      Height:        Intake/Output Summary (Last 24 hours) at 01/28/2021 0748 Last data filed at 01/27/2021 1800 Gross per 24 hour  Intake 1545.48 ml  Output 710 ml  Net 835.48 ml   Filed Weights   01/24/21 1229  Weight: 119.7 kg     Data Reviewed:   CBC: Recent Labs  Lab 01/24/21 1242 01/25/21 0445 01/26/21 0039 01/27/21 0355 01/28/21 0154  WBC 9.1 13.3* 11.4* 9.4 13.4*  NEUTROABS 6.8 11.0* 7.4  --   --   HGB 14.8 12.5* 12.0* 11.8* 9.9*  HCT 44.2 38.2* 36.9* 36.5* 30.4*  MCV 85.5 87.4 88.5 89.2 88.1  PLT 248 255 217 241 657   Basic Metabolic Panel: Recent Labs  Lab 01/24/21 1242 01/25/21 0445 01/26/21 0039 01/27/21 0355 01/28/21 0154  NA 139 137 137 135 134*  K 4.0 4.6 4.6 4.2 5.0  CL 106 103 105 101 102  CO2 24 26 24 25 25   GLUCOSE 139* 138* 120* 88 137*  BUN 19 16 21  24* 24*  CREATININE 1.36* 1.45* 1.72* 1.50* 1.61*  CALCIUM 9.1 8.5* 9.2 9.4 8.9  MG  --   --  2.1 2.0 2.1  PHOS  --   --  3.6 4.3  --    GFR: Estimated Creatinine Clearance: 48.4 mL/min (A) (by C-G formula based on SCr of 1.61 mg/dL (H)). Liver Function Tests: Recent Labs  Lab 01/26/21 0039  AST 15  ALT 16  ALKPHOS 55  BILITOT 0.7  PROT 5.6*  ALBUMIN 2.9*   No results for input(s): LIPASE, AMYLASE in the last 168 hours. No results for input(s): AMMONIA in the last 168 hours. Coagulation Profile: No results for input(s): INR, PROTIME in the last 168 hours. Cardiac Enzymes: Recent Labs  Lab 01/24/21 1242  CKTOTAL 77   BNP (last 3 results) No results for input(s): PROBNP in the last 8760 hours. HbA1C: No results for input(s): HGBA1C in the last 72 hours. CBG: No results for input(s): GLUCAP in the last 168 hours. Lipid Profile: No results for input(s): CHOL, HDL, LDLCALC, TRIG, CHOLHDL, LDLDIRECT in the last 72 hours. Thyroid Function Tests: No results for input(s): TSH, T4TOTAL, FREET4, T3FREE, THYROIDAB in the last 72 hours. Anemia Panel: No results  for input(s): VITAMINB12, FOLATE, FERRITIN, TIBC, IRON, RETICCTPCT in the last 72 hours. Sepsis Labs: No results for input(s): PROCALCITON, LATICACIDVEN in the last 168 hours.  Recent Results (from the past 240 hour(s))  SARS Coronavirus 2 by RT PCR (hospital order, performed in [facility] hospital lab) Nasopharyngeal Nasopharyngeal Swab     Status: None   Collection Time: 01/24/21  5:05 PM   Specimen: Nasopharyngeal Swab  Result Value Ref Range Status   SARS Coronavirus 2 NEGATIVE NEGATIVE Final    Comment: (NOTE) SARS-CoV-2 target nucleic acids are NOT DETECTED.  The SARS-CoV-2 RNA is generally detectable in upper and lower respiratory specimens during the acute phase of infection. The lowest concentration of SARS-CoV-2 viral copies this assay can detect is 250 copies / mL. A negative result does not preclude SARS-CoV-2  infection and should not be used as the sole basis for treatment or other patient management decisions.  A negative result may occur with improper specimen collection / handling, submission of specimen other than nasopharyngeal swab, presence of viral mutation(s) within the areas targeted by this assay, and inadequate number of viral copies (<250 copies / mL). A negative result must be combined with clinical observations, patient history, and epidemiological information.  Fact Sheet for Patients:   StrictlyIdeas.no  Fact Sheet for Healthcare Providers: BankingDealers.co.za  This test is not yet approved or  cleared by the [place] FDA and has been authorized for detection and/or diagnosis of SARS-CoV-2 by FDA under an Emergency Use Authorization (EUA).  This EUA will remain in effect (meaning this test can be used) for the duration of the COVID-19 declaration under Section 564(b)(1) of the Act, 21 U.S.C. section 360bbb-3(b)(1), unless the authorization is terminated or revoked sooner.  Performed at [facility], [place] [address] [place], [place] [address]   Surgical pcr screen     Status: None   Collection Time: 01/27/21  4:00 AM   Specimen: Nasal Mucosa; Nasal Swab  Result Value Ref Range Status   MRSA, PCR NEGATIVE NEGATIVE Final   Staphylococcus aureus NEGATIVE NEGATIVE Final    Comment: (NOTE) The Xpert SA Assay (FDA approved for NASAL specimens in patients 40 years of age and older), is one component of a comprehensive surveillance program. It is not intended to diagnose infection nor to guide or monitor treatment. Performed at [facility], [place] [address] [place], [place] [address]          Radiology Studies: No results found.      Scheduled Meds:  acetaminophen  1,000 mg Oral Q8H   enoxaparin (LOVENOX) injection  40 mg Subcutaneous Q24H   ezetimibe  10 mg Oral Daily   gabapentin  200 mg Oral BID   lisinopril  5 mg Oral Daily   multivitamin with minerals  1 tablet Oral Daily   Ensure Max Protein  11 oz Oral QHS   rosuvastatin  5 mg Oral Once per day on Mon Thu   Continuous Infusions:  sodium chloride       LOS: 4 days   Time spent= 35 mins    Nykeria Mealing Arsenio Loader, MD [facility]  If 7PM-7AM, please contact night-coverage  01/28/2021, 7:48 AM

## 2021-01-28 NOTE — Evaluation (Signed)
Occupational Therapy Evaluation Patient Details Name: [name] MRN: [id number] DOB: Feb 26, 1946 Today's Date: 01/28/2021   History of Present Illness 75 yo male presenting to ED  on 9/25 with R calf unjiry after slamming it in a car door. S/p irrigation and debridement of RLE for compartment fasciotomy on 9/28.  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   Clinical Impression   PTA, pt was living with his wife and was independent; acting as caregiver for his wife and performing all IADLs. Pt currently requiring Supervision-Min A for UB ADLs, Min A for LB ADLs, and Min A for functional mobility with RW. Pt presenting with decreased standing balance (with posterior lean) and poor awareness of deficits. Pt would benefit from further acute OT to facilitate safe dc. Pending pt progress, recommend dc to SNF for further OT to optimize safety, independence with ADLs, and return to PLOF.       Recommendations for follow up therapy are one component of a multi-disciplinary discharge planning process, led by the attending physician.  Recommendations may be updated based on patient status, additional functional criteria and insurance authorization.   Follow Up Recommendations  SNF (Pending progress may be able to dc to hoem with HHOT)    Equipment Recommendations  3 in 1 bedside commode    Recommendations for Other Services PT consult     Precautions / Restrictions Precautions Precautions: Fall Restrictions Weight Bearing Restrictions: Yes RLE Weight Bearing: Weight bearing as tolerated      Mobility Bed Mobility Overal bed mobility: Needs Assistance Bed Mobility: Supine to Sit     Supine to sit: Min assist;HOB elevated     General bed mobility comments: Min A to hold therapist's hand and pull into upright position. increased time for scoot to EOB    Transfers Overall transfer level: Needs  assistance Equipment used: Rolling walker (2 wheeled) Transfers: Sit to/from Stand Sit to Stand: Min assist         General transfer comment: Min A for power up and correction of posterior lean    Balance Overall balance assessment: Needs assistance Sitting-balance support: No upper extremity supported;Feet supported Sitting balance-Leahy Scale: Fair     Standing balance support: Bilateral upper extremity supported;During functional activity;Single extremity supported Standing balance-Leahy Scale: Poor Standing balance comment: Reliant on physical A and UE support                           ADL either performed or assessed with clinical judgement   ADL Overall ADL's : Needs assistance/impaired Eating/Feeding: Set up;Sitting   Grooming: Minimal assistance;Standing   Upper Body Bathing: Minimal assistance;Sitting   Lower Body Bathing: Minimal assistance;Sit to/from stand   Upper Body Dressing : Supervision/safety;Set up;Sitting   Lower Body Dressing: Minimal assistance;Sit to/from stand   Toilet Transfer: Minimal assistance;Ambulation;RW Toilet Transfer Details (indicate cue type and reason): Min A for posterior lean         Functional mobility during ADLs: Minimal assistance;Rolling walker General ADL Comments: Pt presenting with decreased standing balance and posterior lean during dyanmic standing.     Vision Baseline Vision/History: 1 Wears glasses Patient Visual Report: No change from baseline       Perception     Praxis      Pertinent Vitals/Pain Pain Assessment: 0-10 Pain Score: 4  Pain Location: RLE Pain Descriptors / Indicators: Constant;Discomfort;Grimacing Pain Intervention(s): Monitored  during session;Limited activity within patient's tolerance;Repositioned     Hand Dominance Right   Extremity/Trunk Assessment Upper Extremity Assessment Upper Extremity Assessment: Overall WFL for tasks assessed   Lower Extremity Assessment Lower  Extremity Assessment: Defer to PT evaluation;RLE deficits/detail RLE Deficits / Details: S/p irrigation and debridement of RLE for compartment fasciotomy on 9/28   Cervical / Trunk Assessment Cervical / Trunk Assessment: Other exceptions Cervical / Trunk Exceptions: increased body habitus   Communication Communication Communication: No difficulties   Cognition Arousal/Alertness: Awake/alert Behavior During Therapy: WFL for tasks assessed/performed Overall Cognitive Status: Within Functional Limits for tasks assessed Area of Impairment: Safety/judgement                         Safety/Judgement: Decreased awareness of deficits     General Comments: Tangiental at times but feel this is baseline. Decreased awareness of posterior lean in standing   General Comments  Wound vac in place    Exercises     Shoulder Instructions      Home Living Family/patient expects to be discharged to:: Private residence Living Arrangements: Spouse/significant other Available Help at Discharge: Family Type of Home: House Home Access: Stairs to enter Technical brewer of Steps: 3 Entrance Stairs-Rails: Left Home Layout: One level     Bathroom Shower/Tub: Occupational psychologist: Handicapped height     Home Equipment: [place] - single point;Crutches;Shower seat          Prior Functioning/Environment Level of Independence: Independent        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        OT Problem List: Decreased strength;Decreased range of motion;Decreased activity tolerance;Impaired balance (sitting and/or standing);Decreased knowledge of use of DME or AE;Decreased knowledge of precautions;Pain      OT Treatment/Interventions: Self-care/ADL training;Therapeutic exercise;Energy conservation;DME and/or AE instruction;Therapeutic activities;Patient/family education    OT Goals(Current goals can be found in the care plan section) Acute Rehab OT  Goals Patient Stated Goal: Go home tomorrow OT Goal Formulation: With patient Time For Goal Achievement: 02/12/21 Potential to Achieve Goals: Good  OT Frequency: Min 3X/week   Barriers to D/C:            Co-evaluation PT/OT/SLP Co-Evaluation/Treatment: Yes Reason for Co-Treatment: To address functional/ADL transfers (pain control)   OT goals addressed during session: ADL's and self-care      AM-PAC OT "6 Clicks" Daily Activity     Outcome Measure Help from another person eating meals?: None Help from another person taking care of personal grooming?: A Little Help from another person toileting, which includes using toliet, bedpan, or urinal?: A Little Help from another person bathing (including washing, rinsing, drying)?: A Little Help from another person to put on and taking off regular upper body clothing?: A Little Help from another person to put on and taking off regular lower body clothing?: A Little 6 Click Score: 19   End of Session Equipment Utilized During Treatment: Rolling walker Nurse Communication: Mobility status  Activity Tolerance: Patient tolerated treatment well Patient left: in chair;with call bell/phone within reach;with chair alarm set  OT Visit Diagnosis: Unsteadiness on feet (R26.81);Other abnormalities of gait and mobility (R26.89);Muscle weakness (generalized) (M62.81);Pain Pain - Right/Left: Right Pain - part of body: Leg                Time: 8546-2703 OT Time Calculation (min): 23 min Charges:  OT General Charges $OT Visit: 1 Visit OT Evaluation $OT Eval Low  Complexity: Jan [place], OTR/L Acute Rehab Pager: [phone] Office: [place] 01/28/2021, 11:12 AM

## 2021-01-28 NOTE — Plan of Care (Signed)
Patient complaints of prevena making a noise throughout the night. Removed kerlix gauze and assessed dressing, placed compression wrap over dressing, sealed leak, no sound coming from vac now. Will continue to monitor.  Problem: Education: Goal: Knowledge of General Education information will improve Description: Including pain rating scale, medication(s)/side effects and non-pharmacologic comfort measures Outcome: Progressing   Problem: Activity: Goal: Risk for activity intolerance will decrease Outcome: Progressing   Problem: Pain Managment: Goal: General experience of comfort will improve Outcome: Progressing   Problem: Safety: Goal: Ability to remain free from injury will improve Outcome: Progressing   Problem: Skin Integrity: Goal: Risk for impaired skin integrity will decrease Outcome: Progressing

## 2021-01-28 NOTE — Discharge Instructions (Signed)
     Discharge Instructions    Attending Surgeon: [name], MD Office Phone Number: [phone]   Diagnosis and Procedures:    Surgeries Performed: Right leg fasciotomy  Discharge Plan:    Diet: Resume usual diet. Begin with light or bland foods.  Drink plenty of fluids.  Activity:  Weight bearing as tolerated You are advised to go home directly from the hospital or surgical center. Restrict your activities.  GENERAL INSTRUCTIONS: 1.  Keep your surgical site elevated above your heart for at least 5-7 days or longer to prevent swelling. This will improve your comfort and your overall recovery following surgery.     2. Please call Dr. [name] office at [phone] with questions Monday-Friday during business hours. If no one answers, please leave a message and someone should get back to the patient within 24 hours. For emergencies please call 911 or proceed to the emergency room.   3. Patient to notify surgical team if experiences any of the following: Bowel/Bladder dysfunction, uncontrolled pain, nerve/muscle weakness, incision with increased drainage or redness, nausea/vomiting and Fever greater than 101.0 F.  Be alert for signs of infection including redness, streaking, odor, fever or chills. Be alert for excessive pain or bleeding and notify your surgeon immediately.  WOUND INSTRUCTIONS:   Leave your dressing in place until your post operative visit.  Keep it clean and dry.  Always keep the incision clean and dry until the staples/sutures are removed. If there is no drainage from the incision you should keep it open to air. If there is drainage from the incision you must keep it covered at all times until the drainage stops  Do not soak in a bath tub, hot tub, pool, lake or other body of water until 21 days after your surgery and your incision is completely dry and healed.  If you have removable sutures (or staples) they must be removed 10-14 days (unless otherwise  instructed) from the day of your surgery.     1)  Elevate the extremity as much as possible.  2)  Keep the dressing clean and dry.  3)  Please call us if the dressing becomes wet or dirty.  4)  If you are experiencing worsening pain or worsening swelling, please call.     MEDICATIONS: Resume all previous home medications at the previous prescribed dose and frequency unless otherwise noted Start taking the  pain medications on an as-needed basis as prescribed  Please taper down pain medication over the next week following surgery.  Ideally you should not require a refill of any narcotic pain medication.  Take pain medication with food to minimize nausea. In addition to the prescribed pain medication, you may take over-the-counter pain relievers such as Tylenol.  Do NOT take additional tylenol if your pain medication already has tylenol in it.  Resume home anticoagulation      FOLLOWUP INSTRUCTIONS: 1. Follow up at the Physical Therapy Clinic 3-4 days following surgery. This appointment should be scheduled unless other arrangements have been made.The Physical Therapy scheduling number is [phone] if an appointment has not already been arranged.  2. Contact Dr. [name] office during office hours at [phone] or the practice after hours line at [phone] for non-emergencies. For medical emergencies call 911.

## 2021-01-29 DIAGNOSIS — T79A21D Traumatic compartment syndrome of right lower extremity, subsequent encounter: Secondary | ICD-10-CM | POA: Diagnosis not present

## 2021-01-29 LAB — CBC
HCT: 32.5 % — ABNORMAL LOW (ref 39.0–52.0)
Hemoglobin: 10.3 g/dL — ABNORMAL LOW (ref 13.0–17.0)
MCH: 28.5 pg (ref 26.0–34.0)
MCHC: 31.7 g/dL (ref 30.0–36.0)
MCV: 90 fL (ref 80.0–100.0)
Platelets: 275 10*3/uL (ref 150–400)
RBC: 3.61 MIL/uL — ABNORMAL LOW (ref 4.22–5.81)
RDW: 14.6 % (ref 11.5–15.5)
WBC: 12.8 10*3/uL — ABNORMAL HIGH (ref 4.0–10.5)
nRBC: 0.2 % (ref 0.0–0.2)

## 2021-01-29 LAB — BASIC METABOLIC PANEL
Anion gap: 5 (ref 5–15)
BUN: 28 mg/dL — ABNORMAL HIGH (ref 8–23)
CO2: 29 mmol/L (ref 22–32)
Calcium: 8.8 mg/dL — ABNORMAL LOW (ref 8.9–10.3)
Chloride: 100 mmol/L (ref 98–111)
Creatinine, Ser: 1.65 mg/dL — ABNORMAL HIGH (ref 0.61–1.24)
GFR, Estimated: 43 mL/min — ABNORMAL LOW (ref >60)
Glucose, Bld: 113 mg/dL — ABNORMAL HIGH (ref 70–99)
Potassium: 4.6 mmol/L (ref 3.5–5.1)
Sodium: 134 mmol/L — ABNORMAL LOW (ref 135–145)

## 2021-01-29 LAB — MAGNESIUM: Magnesium: 2.4 mg/dL (ref 1.7–2.4)

## 2021-01-29 MED ORDER — SENNOSIDES-DOCUSATE SODIUM 8.6-50 MG PO TABS
1.0000 | ORAL_TABLET | Freq: Every evening | ORAL | Status: DC | PRN
  Administered 2021-01-30 – 2021-02-05 (×3): 1 via ORAL
  Filled 2021-01-29 (×3): qty 1

## 2021-01-29 MED ORDER — ONDANSETRON HCL 4 MG/2ML IJ SOLN
4.0000 mg | Freq: Three times a day (TID) | INTRAMUSCULAR | Status: DC | PRN
  Administered 2021-02-11: 4 mg via INTRAVENOUS
  Filled 2021-01-29: qty 2

## 2021-01-29 MED ORDER — TICAGRELOR 60 MG PO TABS
60.0000 mg | ORAL_TABLET | Freq: Two times a day (BID) | ORAL | Status: DC
  Administered 2021-01-29 – 2021-01-30 (×4): 60 mg via ORAL
  Filled 2021-01-29 (×5): qty 1

## 2021-01-29 MED ORDER — GABAPENTIN 300 MG PO CAPS
300.0000 mg | ORAL_CAPSULE | Freq: Three times a day (TID) | ORAL | Status: DC
  Administered 2021-01-29 – 2021-02-11 (×37): 300 mg via ORAL
  Filled 2021-01-29 (×37): qty 1

## 2021-01-29 MED ORDER — ASPIRIN EC 81 MG PO TBEC
81.0000 mg | DELAYED_RELEASE_TABLET | Freq: Every day | ORAL | Status: DC
  Administered 2021-01-29 – 2021-02-11 (×13): 81 mg via ORAL
  Filled 2021-01-29 (×13): qty 1

## 2021-01-29 MED ORDER — SIMETHICONE 80 MG PO CHEW: 80.0000 mg | CHEWABLE_TABLET | Freq: Four times a day (QID) | ORAL | Status: DC | PRN

## 2021-01-29 MED ORDER — POLYETHYLENE GLYCOL 3350 17 G PO PACK
17.0000 g | PACK | Freq: Every day | ORAL | Status: DC | PRN
  Administered 2021-02-01 – 2021-02-06 (×4): 17 g via ORAL
  Filled 2021-01-29 (×5): qty 1

## 2021-01-29 NOTE — TOC Initial Note (Signed)
Transition of Care [facility]) - Initial/Assessment Note    Patient Details  Name: [name] MRN: [id number] Date of Birth: Jul 16, 1945  Transition of Care [facility]) CM/SW Contact:    [name], [place] Phone Number: 01/29/2021, 1:04 PM  Clinical Narrative:                 CSW received consult for possible SNF placement at time of discharge. CSW spoke with patient. Patient expressed understanding of PT recommendation and is agreeable to SNF placement at time of discharge. Patient reports preference for a facility on the "[place]". CSW discussed insurance authorization process and will provide Medicare SNF ratings list. The patient stated that he will go back home with his wife and daughter after attending rehab.  Patient has received 5 COVID vaccines. No further questions reported at this time.   Skilled Nursing Rehab Facilities-   [email or web address] Ratings out of 5 possible    Name Address  Phone # [place] Overall  [facility] [address], [place] 5 1 4 4   [facility]  [place], [place] [phone] 3 1 5 4   [facility] [place], [place] 3 1 1 1   [place] [address] [phone] 2 2 4 4   [facility] [address], [place] 3 1 2 1   [place] 3 2 4 4   [facility] [address] [place] 5 1 2 2   [facility] [address], [place] 5 2 2 3   [place] [facility] [place] 5 1 2 2   [facility] [address], [facility] [phone] 5 1 2 2   [facility] [address], [facility] [phone] 5 1 2 2   [place] 3 1 1 1      Expected Discharge Plan: Skilled  Nursing Facility Barriers to Discharge: SNF Authorization Denied, SNF Pending bed offer   Patient Goals and CMS Choice Patient states their goals for this hospitalization and ongoing recovery are:: To go to rehab and get better so that he can go home to his wife CMS Medicare.gov Compare Post Acute Care list provided to:: Patient Choice offered to / list presented to : Patient  Expected Discharge Plan and Services Expected Discharge Plan: [place] arrangements for the past 2 months: Single Family Home                                      Prior Living Arrangements/Services Living arrangements for the past 2 months: Single Family Home Lives with:: Adult Children, Spouse Patient language and need for interpreter reviewed:: Yes Do you feel safe going back to the place where you live?: Yes      Need for Family Participation in Patient Care: Yes (Comment) Care giver support system in place?: Yes (comment)   Criminal Activity/Legal Involvement Pertinent to Current Situation/Hospitalization: No - Comment as needed  Activities of Daily Living Home Assistive Devices/Equipment: None ADL Screening (condition at time of admission) Patient's cognitive ability adequate to safely complete daily activities?: Yes Is the patient deaf or have difficulty hearing?: No Does the patient have difficulty seeing, even when wearing glasses/contacts?: No Does the patient have difficulty concentrating, remembering,  or making decisions?: No Patient able to express need for assistance with ADLs?: Yes Does the patient have difficulty dressing or bathing?: No Independently performs ADLs?: Yes (appropriate for developmental age) Does the patient have difficulty walking or climbing stairs?: No Weakness of Legs: None Weakness of Arms/Hands: None  Permission Sought/Granted   Permission granted to share information with : Yes, Verbal Permission Granted     Permission  granted to share info w AGENCY: SNF        Emotional Assessment Appearance:: Appears stated age Attitude/Demeanor/Rapport: Engaged Affect (typically observed): Accepting, Pleasant Orientation: : Oriented to  Time, Oriented to Situation, Oriented to Place, Oriented to Self Alcohol / Substance Use: Not Applicable Psych Involvement: No (comment)  Admission diagnosis:  Hematoma [T14.8XXA] 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] Patient Active Problem List   Diagnosis Date Noted   Hematoma    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   Myalgia due to statin 03/12/2020   Coronary artery disease due to lipid rich plaque    Non-ST elevation (NSTEMI) myocardial infarction [facility])    Hypertensive emergency    Elevated troponin    Leg edema    Nephrolithiasis 08/04/2019   AKI (acute kidney injury) ([place]) 08/04/2019   Hypokalemia 08/04/2019   infected Renal calculus, left 02/04/2019   PCP:  [name], MD Pharmacy:   [place], [place] - [address] [place] [address] [place] [address] [place] [address] Phone: [phone] Fax: [place] [place] [address] [place] [address] Phone: [phone] Fax: [phone]     Social Determinants of Health (SDOH) Interventions    Readmission Risk Interventions No flowsheet data found.

## 2021-01-29 NOTE — Progress Notes (Addendum)
PROGRESS NOTE    [name]  KPQ:[id number] DOB: [birth year] DOA: 01/24/2021 PCP: [name], MD   Brief Narrative:  75 y.o. 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  In the hospital he was diagnosed with right lower extremity compartment syndrome and underwent emergent fasciotomy.  He is status post OR again on 5/11 for I&D, application of dermal skin graft and wound VAC placement.  Patient was seen by physical therapy who recommended SNF.   Assessment & Plan:   Active Problems:   Compartment syndrome (HCC)   Hematoma     Right lower extremity compartment syndrome -Status post emergent fasciotomy on 9/25 with repeat I&D and skin grafting with wound VAC placement on 9/28.  Increase his pain medications today.  Continue bowel regimen.  Will need outpatient follow-up and wound care management per orthopedic.  Resume aspirin and Brilinta -Increase gabapentin 3 mg 3 times daily - PT-recommending SNF.  TOC consulted  Acute blood loss anemia - Baseline hemoglobin 13.0, slowly drifted down to 9.9.  Continue to monitor this   Syncope -This is resolved.   CKD IIIa -Creatinine is stable and at baseline 1.5   CAD with stenting Will resume aspirin and Brilinta (Discussed with Cardiology, Dr Percival Spanish, who prefers continuing Brilinta as well unless if he remains high risk).    HTN -Lisinopril 5 mg daily, metoprolol    Dyslipidemia -Daily Zetia    DVT prophylaxis: Lovenox Code Status: Full code Family Communication:    Status is: Inpatient  Remains inpatient appropriate because:Inpatient level of care appropriate due to severity of illness.  Awaiting SNF placement  Dispo: The patient is from: Home              Anticipated d/c is to: SNF              Patient currently Is medically ready to be placed  at SNF when bed available. TOC team aware.    Difficult to place patient No       Nutritional status  Nutrition Problem: Increased nutrient needs Etiology: wound healing, post-op healing  Signs/Symptoms: estimated needs  Interventions: MVI, Refer to RD note for recommendations, Magic cup, Premier Protein  Body mass index is 40.14 kg/m.           Subjective: Feels ok, still has some stinging sensation in his skin. No acute evetns overnight.   Review of Systems Otherwise negative except as per HPI, including: General = no fevers, chills, dizziness,  fatigue HEENT/EYES = negative for loss of vision, double vision, blurred vision,  sore throa Cardiovascular= negative for chest pain, palpitation Respiratory/lungs= negative for shortness of breath, cough, wheezing; hemoptysis,  Gastrointestinal= negative for nausea, vomiting, abdominal pain Genitourinary= negative for Dysuria MSK = Negative for arthralgia, myalgias Neurology= Negative for headache, numbness, tingling  Psychiatry= Negative for suicidal and homocidal ideation Skin= Negative for Rash  Physical Exam Constitutional: Not in acute distress Respiratory: Clear to auscultation bilaterally Cardiovascular: Normal sinus rhythm, no rubs Abdomen: Nontender nondistended good bowel sounds Musculoskeletal: No edema noted Skin: LLE dressing in place with swelling.  Neurologic: CN 2-12 grossly intact.  And nonfocal Psychiatric: Normal judgment and insight. Alert and oriented x 3. Normal mood.     Objective: Vitals:   01/28/21 0740 01/28/21 1550 01/28/21 2137 01/29/21 0818  BP: 107/88 129/61 Marland Kitchen)  121/59 (!) 113/53  Pulse: 64 65 68 63  Resp: 14 15 18 15   Temp: 98 F (36.7 C) 98.1 F (36.7 C) 97.9 F (36.6 C) 97.6 F (36.4 C)  TempSrc: Oral Oral Oral Oral  SpO2: 100% 100% 100% 99%  Weight:      Height:        Intake/Output Summary (Last 24 hours) at 01/29/2021 1142 Last data filed at 01/29/2021 0800 Gross  per 24 hour  Intake 480 ml  Output 700 ml  Net -220 ml   Filed Weights   01/24/21 1229  Weight: 119.7 kg     Data Reviewed:   CBC: Recent Labs  Lab 01/24/21 1242 01/25/21 0445 01/26/21 0039 01/27/21 0355 01/28/21 0154 01/29/21 0329  WBC 9.1 13.3* 11.4* 9.4 13.4* 12.8*  NEUTROABS 6.8 11.0* 7.4  --   --   --   HGB 14.8 12.5* 12.0* 11.8* 9.9* 10.3*  HCT 44.2 38.2* 36.9* 36.5* 30.4* 32.5*  MCV 85.5 87.4 88.5 89.2 88.1 90.0  PLT 248 255 217 241 246 680   Basic Metabolic Panel: Recent Labs  Lab 01/25/21 0445 01/26/21 0039 01/27/21 0355 01/28/21 0154 01/29/21 0329  NA 137 137 135 134* 134*  K 4.6 4.6 4.2 5.0 4.6  CL 103 105 101 102 100  CO2 26 24 25 25 29   GLUCOSE 138* 120* 88 137* 113*  BUN 16 21 24* 24* 28*  CREATININE 1.45* 1.72* 1.50* 1.61* 1.65*  CALCIUM 8.5* 9.2 9.4 8.9 8.8*  MG  --  2.1 2.0 2.1 2.4  PHOS  --  3.6 4.3  --   --    GFR: Estimated Creatinine Clearance: 47.2 mL/min (A) (by C-G formula based on SCr of 1.65 mg/dL (H)). Liver Function Tests: Recent Labs  Lab 01/26/21 0039  AST 15  ALT 16  ALKPHOS 55  BILITOT 0.7  PROT 5.6*  ALBUMIN 2.9*   No results for input(s): LIPASE, AMYLASE in the last 168 hours. No results for input(s): AMMONIA in the last 168 hours. Coagulation Profile: No results for input(s): INR, PROTIME in the last 168 hours. Cardiac Enzymes: Recent Labs  Lab 01/24/21 1242  CKTOTAL 77   BNP (last 3 results) No results for input(s): PROBNP in the last 8760 hours. HbA1C: No results for input(s): HGBA1C in the last 72 hours. CBG: No results for input(s): GLUCAP in the last 168 hours. Lipid Profile: No results for input(s): CHOL, HDL, LDLCALC, TRIG, CHOLHDL, LDLDIRECT in the last 72 hours. Thyroid Function Tests: No results for input(s): TSH, T4TOTAL, FREET4, T3FREE, THYROIDAB in the last 72 hours. Anemia Panel: No results for input(s): VITAMINB12, FOLATE, FERRITIN, TIBC, IRON, RETICCTPCT in the last 72 hours. Sepsis  Labs: No results for input(s): PROCALCITON, LATICACIDVEN in the last 168 hours.  Recent Results (from the past 240 hour(s))  SARS Coronavirus 2 by RT PCR (hospital order, performed in [facility] hospital lab) Nasopharyngeal Nasopharyngeal Swab     Status: None   Collection Time: 01/24/21  5:05 PM   Specimen: Nasopharyngeal Swab  Result Value Ref Range Status   SARS Coronavirus 2 NEGATIVE NEGATIVE Final    Comment: (NOTE) SARS-CoV-2 target nucleic acids are NOT DETECTED.  The SARS-CoV-2 RNA is generally detectable in upper and lower respiratory specimens during the acute phase of infection. The lowest concentration of SARS-CoV-2 viral copies this assay can detect is 250 copies / mL. A negative result does not preclude SARS-CoV-2 infection and should not be used as the sole basis for treatment  or other patient management decisions.  A negative result may occur with improper specimen collection / handling, submission of specimen other than nasopharyngeal swab, presence of viral mutation(s) within the areas targeted by this assay, and inadequate number of viral copies (<250 copies / mL). A negative result must be combined with clinical observations, patient history, and epidemiological information.  Fact Sheet for Patients:   StrictlyIdeas.no  Fact Sheet for Healthcare Providers: BankingDealers.co.za  This test is not yet approved or  cleared by the [place] FDA and has been authorized for detection and/or diagnosis of SARS-CoV-2 by FDA under an Emergency Use Authorization (EUA).  This EUA will remain in effect (meaning this test can be used) for the duration of the COVID-19 declaration under Section 564(b)(1) of the Act, 21 U.S.C. section 360bbb-3(b)(1), unless the authorization is terminated or revoked sooner.  Performed at [facility], [place] [address] [place], [place] [address]   Surgical pcr screen     Status: None    Collection Time: 01/27/21  4:00 AM   Specimen: Nasal Mucosa; Nasal Swab  Result Value Ref Range Status   MRSA, PCR NEGATIVE NEGATIVE Final   Staphylococcus aureus NEGATIVE NEGATIVE Final    Comment: (NOTE) The Xpert SA Assay (FDA approved for NASAL specimens in patients 3 years of age and older), is one component of a comprehensive surveillance program. It is not intended to diagnose infection nor to guide or monitor treatment. Performed at [facility], [place] [address], [place], [place] [address]          Radiology Studies: No results found.      Scheduled Meds:  enoxaparin (LOVENOX) injection  40 mg Subcutaneous Q24H   ezetimibe  10 mg Oral Daily   gabapentin  200 mg Oral BID   lisinopril  5 mg Oral Daily   multivitamin with minerals  1 tablet Oral Daily   Ensure Max Protein  11 oz Oral QHS   rosuvastatin  5 mg Oral Once per day on Mon Thu   Continuous Infusions:  sodium chloride       LOS: 5 days   Time spent= 35 mins    Kathlyn Leachman Arsenio Loader, MD [facility]  If 7PM-7AM, please contact night-coverage  01/29/2021, 11:42 AM

## 2021-01-29 NOTE — Progress Notes (Addendum)
Occupational Therapy Treatment Patient Details Name: [name] MRN: [id number] DOB: 1945-07-26 Today's Date: 01/29/2021   History of present illness 75 yo male presenting to ED  on 9/25 with R calf unjiry after slamming it in a car door. S/p irrigation and debridement of RLE for compartment fasciotomy on 9/28.  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   OT comments  Pt progressing towards established OT goals. However, continues to demonstrate decreased balance and safety. Pt requiring Max A for donning socks. Pt completing grooming while standing at sink with Min Guard A. Pt requiring Min A for functional mobility with RW. Continue to recommend dc to SNF and will continue to follow acutely as admitted.   Recommendations for follow up therapy are one component of a multi-disciplinary discharge planning process, led by the attending physician.  Recommendations may be updated based on patient status, additional functional criteria and insurance authorization.    Follow Up Recommendations  SNF    Equipment Recommendations  3 in 1 bedside commode    Recommendations for Other Services PT consult    Precautions / Restrictions Precautions Precautions: Fall Restrictions Weight Bearing Restrictions: No RLE Weight Bearing: Weight bearing as tolerated       Mobility Bed Mobility Overal bed mobility: Needs Assistance Bed Mobility: Supine to Sit     Supine to sit: Min assist;HOB elevated     General bed mobility comments: In recliner upon arrival    Transfers Overall transfer level: Needs assistance Equipment used: Rolling walker (2 wheeled) Transfers: Sit to/from Stand Sit to Stand: Min assist         General transfer comment: Min assist for initial power up and steadying upon standing.    Balance Overall balance assessment: Needs assistance Sitting-balance support: No upper extremity  supported;Feet supported Sitting balance-Leahy Scale: Fair     Standing balance support: Bilateral upper extremity supported;During functional activity Standing balance-Leahy Scale: Poor Standing balance comment: reliant on UE support                           ADL either performed or assessed with clinical judgement   ADL Overall ADL's : Needs assistance/impaired     Grooming: Oral care;Wash/dry face;Min guard;Standing Grooming Details (indicate cue type and reason): Close Min Guard A for safety               Lower Body Dressing Details (indicate cue type and reason): Max A for donning socks. Reporting he is unabel to use figure four and don socks as he would at hoem due to pain Toilet Transfer: Minimal assistance;Ambulation;RW Toilet Transfer Details (indicate cue type and reason): Min A for weight shift forward         Functional mobility during ADLs: Minimal assistance;Rolling walker General ADL Comments: Pt contineus to present with decreased balance, safety, abd activity tolerance     Vision       Perception     Praxis      Cognition Arousal/Alertness: Awake/alert Behavior During Therapy: WFL for tasks assessed/performed Overall Cognitive Status: Within Functional Limits for tasks assessed Area of Impairment: Safety/judgement                         Safety/Judgement: Decreased awareness of deficits     General Comments: cues for safety        Exercises General  Exercises - Lower Extremity Quad Sets: AROM;Right;10 reps;Seated   Shoulder Instructions       General Comments Wound vac in place; pt demonstrating good awareness and management during mobility    Pertinent Vitals/ Pain       Pain Assessment: Faces Faces Pain Scale: Hurts little more Pain Location: RLE Pain Descriptors / Indicators: Constant;Discomfort;Grimacing Pain Intervention(s): Monitored during session;Limited activity within patient's  tolerance;Repositioned  Home Living                                          Prior Functioning/Environment              Frequency  Min 2X/week        Progress Toward Goals  OT Goals(current goals can now be found in the care plan section)  Progress towards OT goals: Progressing toward goals  Acute Rehab OT Goals Patient Stated Goal: rehab OT Goal Formulation: With patient Time For Goal Achievement: 02/12/21 Potential to Achieve Goals: Good ADL Goals Pt Will Perform Grooming: with set-up;with supervision;standing Pt Will Perform Lower Body Dressing: with set-up;with supervision;sit to/from stand Pt Will Transfer to Toilet: with set-up;with supervision;ambulating;bedside commode Pt Will Perform Toileting - Clothing Manipulation and hygiene: with set-up;with supervision;sit to/from stand;sitting/lateral leans  Plan Discharge plan remains appropriate    Co-evaluation                 AM-PAC OT "6 Clicks" Daily Activity     Outcome Measure   Help from another person eating meals?: None Help from another person taking care of personal grooming?: A Little Help from another person toileting, which includes using toliet, bedpan, or urinal?: A Little Help from another person bathing (including washing, rinsing, drying)?: A Little Help from another person to put on and taking off regular upper body clothing?: A Little Help from another person to put on and taking off regular lower body clothing?: A Lot 6 Click Score: 18    End of Session Equipment Utilized During Treatment: Rolling walker;Gait belt  OT Visit Diagnosis: Unsteadiness on feet (R26.81);Other abnormalities of gait and mobility (R26.89);Muscle weakness (generalized) (M62.81);Pain Pain - Right/Left: Right Pain - part of body: Leg   Activity Tolerance Patient tolerated treatment well   Patient Left in chair;with call bell/phone within reach;with chair alarm set   Nurse Communication  Mobility status        Time: 1610-9604 OT Time Calculation (min): 24 min  Charges: OT General Charges $OT Visit: 1 Visit OT Treatments $Self Care/Home Management : 23-37 mins  [place], OTR/L Acute Rehab Pager: [phone] Office: [place] 01/29/2021, 12:50 PM

## 2021-01-29 NOTE — NC FL2 (Signed)
[place] LEVEL OF CARE SCREENING TOOL     IDENTIFICATION  Patient Name: Farrell Pantaleo Birthdate: [birth year] Sex: male Admission Date (Current Location): 01/24/2021  [facility] and [place] Number:  Herbalist and Address:  [facility] [place], [place] [address] [place], [place] [address]      Provider Number: [id number]  Attending Physician Name and Address:  Damita Lack, MD  Relative Name and Phone Number:  Cedrik, Heindl (Daughter)   [phone]    Current Level of Care: Hospital Recommended Level of Care: [place] Prior Approval Number:    Date Approved/Denied:   PASRR Number: [id number]  Discharge Plan: SNF    Current Diagnoses: Patient Active Problem List   Diagnosis Date Noted   Hematoma    Compartment syndrome ([place]) 01/24/2021   Myalgia due to statin 03/12/2020   Coronary artery disease due to lipid rich plaque    Non-ST elevation (NSTEMI) myocardial infarction [facility])    Hypertensive emergency    Elevated troponin    Leg edema    Nephrolithiasis 08/04/2019   AKI (acute kidney injury) ([place]) 08/04/2019   Hypokalemia 08/04/2019   infected Renal calculus, left 02/04/2019    Orientation RESPIRATION BLADDER Height & Weight     Self, Time, Situation, Place  Normal Continent Weight: 264 lb (119.7 kg) Height:  5\' 8"  (172.7 cm)  BEHAVIORAL SYMPTOMS/MOOD NEUROLOGICAL BOWEL NUTRITION STATUS      Continent Diet (See d/c summary)  AMBULATORY STATUS COMMUNICATION OF NEEDS Skin   Limited Assist Verbally Surgical wounds                       Personal Care Assistance Level of Assistance  Bathing, Feeding, Dressing Bathing Assistance: Limited assistance Feeding assistance: Independent Dressing Assistance: Limited assistance     Functional Limitations Info  Sight, Hearing, Speech Sight Info: Impaired Hearing Info: Impaired Speech Info: Adequate    SPECIAL CARE FACTORS FREQUENCY  PT (By  licensed PT), OT (By licensed OT)     PT Frequency: 5x/ week OT Frequency: 5x/ week            Contractures Contractures Info: Not present    Additional Factors Info  Allergies, Code Status Code Status Info: Full Allergies Info: Codeine           Current Medications (01/29/2021):  This is the current hospital active medication list Current Facility-Administered Medications  Medication Dose Route Frequency Provider Last Rate Last Admin   acetaminophen (TYLENOL) tablet 650 mg  650 mg Oral Q6H PRN [name], MD       aspirin EC tablet 81 mg  81 mg Oral Daily Amin, Ankit Chirag, MD       enoxaparin (LOVENOX) injection 40 mg  40 mg Subcutaneous Q24H [name], MD   40 mg at 01/29/21 1227   ezetimibe (ZETIA) tablet 10 mg  10 mg Oral Daily [name], MD   10 mg at 01/29/21 1101   gabapentin (NEURONTIN) capsule 300 mg  300 mg Oral TID Amin, Ankit Chirag, MD       hydrALAZINE (APRESOLINE) injection 10 mg  10 mg Intravenous Q4H PRN [name], MD       HYDROmorphone (DILAUDID) injection 0.5 mg  0.5 mg Intravenous Q4H PRN [name], MD   0.5 mg at 01/27/21 2110   ipratropium-albuterol (DUONEB) 0.5-2.5 (3) MG/3ML nebulizer solution 3 mL  3 mL Nebulization Q4H PRN [name], MD  lisinopril (ZESTRIL) tablet 5 mg  5 mg Oral Daily [name], MD   5 mg at 01/29/21 1101   metoprolol tartrate (LOPRESSOR) injection 5 mg  5 mg Intravenous Q4H PRN [name], MD       multivitamin with minerals tablet 1 tablet  1 tablet Oral Daily [name], MD   1 tablet at 01/29/21 1101   oxyCODONE (Oxy IR/ROXICODONE) immediate release tablet 10 mg  10 mg Oral Q4H PRN Amin, Ankit Chirag, MD   10 mg at 01/29/21 1102   protein supplement (ENSURE MAX) liquid  11 oz Oral QHS [name], MD   11 oz at 01/28/21 2154   rosuvastatin (CRESTOR) tablet 5 mg  5 mg Oral Once per day on Mon Thu Bokshan, Remo Lipps, MD   5 mg at 01/28/21 1219   sodium chloride 0.9 % bolus  500 mL  500 mL Intravenous Once [name], MD       ticagrelor [facility]) tablet 60 mg  60 mg Oral BID Amin, Ankit Chirag, MD       traZODone (DESYREL) tablet 50 mg  50 mg Oral QHS PRN [name], MD   50 mg at 01/28/21 2147     Discharge Medications: Please see discharge summary for a list of discharge medications.  Relevant Imaging Results:  Relevant Lab Results:   Additional Information SSN:  [id number]; Moderna COVID-19 Vaccine 09/10/2020 , 03/10/2020 , 06/10/2019 , 05/13/2019 , 5'8" [id number]  Paulene Floor Zareena Willis, LCSWA

## 2021-01-29 NOTE — Progress Notes (Signed)
Physical Therapy Treatment Patient Details Name: [name] MRN: [id number] DOB: December 07, 1945 Today's Date: 01/29/2021   History of Present Illness 75 yo male presenting to ED  on 9/25 with R calf unjiry after slamming it in a car door. S/p irrigation and debridement of RLE for compartment fasciotomy on 9/28.  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    PT Comments    Pt demonstrating improved tolerance for OOB mobility today, ambulating 90 ft with RW and mod cuing for form and safety throughout. Pt reports fatigue and feeling like he is going to "fall out" during gait training, but refuses seated rest. No overt LOB noted. Pt remains deconditioned and limited by RLE pain, current plan remains appropriate.    Recommendations for follow up therapy are one component of a multi-disciplinary discharge planning process, led by the attending physician.  Recommendations may be updated based on patient status, additional functional criteria and insurance authorization.  Follow Up Recommendations  SNF      Equipment Recommendations  Rolling walker with 5" wheels    Recommendations for Other Services       Precautions / Restrictions Precautions Precautions: Fall Restrictions Weight Bearing Restrictions: No RLE Weight Bearing: Weight bearing as tolerated     Mobility  Bed Mobility Overal bed mobility: Needs Assistance Bed Mobility: Supine to Sit     Supine to sit: Min assist;HOB elevated     General bed mobility comments: Min assist for trunk elevation off of bed, HOB elevated >40 deg. Increased time and effort, cues for hand placement and rail use.    Transfers Overall transfer level: Needs assistance Equipment used: Rolling walker (2 wheeled) Transfers: Sit to/from Stand Sit to Stand: Min assist         General transfer comment: Min assist for initial power up and steadying upon  standing.  Ambulation/Gait Ambulation/Gait assistance: Min guard Gait Distance (Feet): 90 Feet Assistive device: Rolling walker (2 wheeled) Gait Pattern/deviations: Step-through pattern;Decreased stride length;Trunk flexed;Antalgic Gait velocity: decr   General Gait Details: close guard for safety, verbal cuing for upright posture, placement in RW. Standing rest breaks x2 to recover UE and LE fatigue, pt stating he felt like he was going to "give out" but declined seated rest.   Stairs             Wheelchair Mobility    Modified Rankin (Stroke Patients Only)       Balance Overall balance assessment: Needs assistance Sitting-balance support: No upper extremity supported;Feet supported Sitting balance-Leahy Scale: Fair     Standing balance support: Bilateral upper extremity supported;During functional activity Standing balance-Leahy Scale: Poor Standing balance comment: reliant on UE support                            Cognition Arousal/Alertness: Awake/alert Behavior During Therapy: WFL for tasks assessed/performed Overall Cognitive Status: Within Functional Limits for tasks assessed Area of Impairment: Safety/judgement                         Safety/Judgement: Decreased awareness of deficits     General Comments: cues for safety during mobility      Exercises General Exercises - Lower Extremity Quad Sets: AROM;Right;10 reps;Seated    General Comments        Pertinent Vitals/Pain Pain Assessment: Faces Faces Pain Scale: Hurts little more Pain  Location: RLE Pain Descriptors / Indicators: Constant;Discomfort;Grimacing Pain Intervention(s): Limited activity within patient's tolerance;Monitored during session;Repositioned    Home Living                      Prior Function            PT Goals (current goals can now be found in the care plan section) Acute Rehab PT Goals Patient Stated Goal: rehab PT Goal Formulation:  With patient Time For Goal Achievement: 02/11/21 Potential to Achieve Goals: Good Progress towards PT goals: Progressing toward goals    Frequency    Min 3X/week      PT Plan Current plan remains appropriate    Co-evaluation              AM-PAC PT "6 Clicks" Mobility   Outcome Measure  Help needed turning from your back to your side while in a flat bed without using bedrails?: A Little Help needed moving from lying on your back to sitting on the side of a flat bed without using bedrails?: A Little Help needed moving to and from a bed to a chair (including a wheelchair)?: A Little Help needed standing up from a chair using your arms (e.g., wheelchair or bedside chair)?: A Little Help needed to walk in hospital room?: A Little Help needed climbing 3-5 steps with a railing? : A Lot 6 Click Score: 17    End of Session   Activity Tolerance: Patient tolerated treatment well Patient left: with call bell/phone within reach;in chair;with chair alarm set Nurse Communication: Mobility status PT Visit Diagnosis: Other abnormalities of gait and mobility (R26.89);Pain Pain - Right/Left: Right Pain - part of body: Leg     Time: 4315-4008 PT Time Calculation (min) (ACUTE ONLY): 26 min  Charges:  $Gait Training: 8-22 mins $Therapeutic Activity: 8-22 mins                     Stacie Glaze, PT DPT Acute Rehabilitation Services Pager [place] [phone]    [place] 01/29/2021, 10:14 AM

## 2021-01-30 DIAGNOSIS — T79A21D Traumatic compartment syndrome of right lower extremity, subsequent encounter: Secondary | ICD-10-CM | POA: Diagnosis not present

## 2021-01-30 LAB — CBC
HCT: 29.5 % — ABNORMAL LOW (ref 39.0–52.0)
Hemoglobin: 9.7 g/dL — ABNORMAL LOW (ref 13.0–17.0)
MCH: 29.2 pg (ref 26.0–34.0)
MCHC: 32.9 g/dL (ref 30.0–36.0)
MCV: 88.9 fL (ref 80.0–100.0)
Platelets: 280 10*3/uL (ref 150–400)
RBC: 3.32 MIL/uL — ABNORMAL LOW (ref 4.22–5.81)
RDW: 15 % (ref 11.5–15.5)
WBC: 13.5 10*3/uL — ABNORMAL HIGH (ref 4.0–10.5)
nRBC: 0.4 % — ABNORMAL HIGH (ref 0.0–0.2)

## 2021-01-30 LAB — BASIC METABOLIC PANEL
Anion gap: 6 (ref 5–15)
BUN: 33 mg/dL — ABNORMAL HIGH (ref 8–23)
CO2: 27 mmol/L (ref 22–32)
Calcium: 8.5 mg/dL — ABNORMAL LOW (ref 8.9–10.3)
Chloride: 100 mmol/L (ref 98–111)
Creatinine, Ser: 1.84 mg/dL — ABNORMAL HIGH (ref 0.61–1.24)
GFR, Estimated: 37 mL/min — ABNORMAL LOW (ref >60)
Glucose, Bld: 111 mg/dL — ABNORMAL HIGH (ref 70–99)
Potassium: 4.7 mmol/L (ref 3.5–5.1)
Sodium: 133 mmol/L — ABNORMAL LOW (ref 135–145)

## 2021-01-30 LAB — MAGNESIUM: Magnesium: 2.4 mg/dL (ref 1.7–2.4)

## 2021-01-30 MED ORDER — ALPRAZOLAM 0.5 MG PO TABS: 0.5000 mg | ORAL_TABLET | Freq: Once | ORAL | Status: DC

## 2021-01-30 MED ORDER — HYDROXYZINE HCL 25 MG PO TABS
25.0000 mg | ORAL_TABLET | Freq: Four times a day (QID) | ORAL | Status: DC | PRN
  Administered 2021-01-31: 25 mg via ORAL
  Filled 2021-01-30: qty 1

## 2021-01-30 MED ORDER — ALPRAZOLAM 0.5 MG PO TABS
0.5000 mg | ORAL_TABLET | Freq: Three times a day (TID) | ORAL | Status: DC | PRN
  Administered 2021-01-30: 0.5 mg via ORAL
  Filled 2021-01-30: qty 1

## 2021-01-30 MED ORDER — SODIUM CHLORIDE 0.9 % IV BOLUS
500.0000 mL | Freq: Once | INTRAVENOUS | Status: AC
  Administered 2021-01-30: 500 mL via INTRAVENOUS

## 2021-01-30 MED ORDER — CHLORHEXIDINE GLUCONATE CLOTH 2 % EX PADS
6.0000 | MEDICATED_PAD | Freq: Every day | CUTANEOUS | Status: DC
  Administered 2021-01-31 – 2021-02-11 (×9): 6 via TOPICAL

## 2021-01-30 NOTE — Progress Notes (Addendum)
Patient voided 50cc thus far around 1500. Patient stood at bedside, unable to void but feels pressure. Patient bladder scanned for 752cc.   Provider paged, told to I&O cath. Patient I&O cath once, unsuccessful attempt. Patient stated he has prostate problems. Coude cath ordered. Once coude arrived on the unit, RN went to bedside to perfrom I&O cath.   When entering room patient states, I have to go pee. Blood and blood clots start coming out of patients penis. Provider paged, states they will call urology on call.  Per urology, get a 3 way cath, 20-22 in and insert. Will be at bedside at some point today.   Awaiting arrival of 3 way cath.   1800: 3 way cath, still not to unit. Patient having 10/10 abd discomfort and hyperventilating. Given IV pain medication and anxiety meds.   Since 3 way catheter was taking a long time to get to unit, it was decided to place the coude cath in the mean time. Coude placed, yellow urine return then went to all blood. Foley emptied for 1000cc.  Urology is suppose to be coming to unit at someone point. Will pass on that they will need to place 3 way catheter if needed.

## 2021-01-30 NOTE — Consult Note (Signed)
Urology Consult  Referring physician: Dr. Reesa Chew Reason for referral: Gross hematuria  Chief Complaint: Gross hematuria  History of Present Illness: [name] is a 75yo with a history of nephrolithiasis and BPH admitted with compartment syndrome in his leg. He was having difficulty urinating and developed gross hematuria on the second day of admission. He had a foley placed on his hematuria worsened. The nursing staff placed a 3 way foley and the patient was started on CBI. He is currently on CBI and the urine is dark red. He is currently on Brilinta. No prior BPH meds. No history of UTI. Currently he is complaining of sharp, constant, severe suprapubic pain. No other associated symptoms. No exacerbating/alleviating events.  Past Medical History:  Diagnosis Date   Cancer [facility])    CHF (congestive heart failure) (HCC)    EF 45-50% on 08/2019 echo   Chronic kidney disease    RENAL STONES   Coronary artery disease    3 vessel disease: DES to RCA, DES to LCx 08/2019 and medical management   High frequency hearing loss    History of migraine headaches    Hx of colonic polyps    Hyperlipidemia    Hypertension    Obesity    Pulmonary hypertension ([place])    Seborrheic dermatitis    Vitiligo    Past Surgical History:  Procedure Laterality Date   APPLICATION OF WOUND VAC Right 01/24/2021   Procedure: APPLICATION OF WOUND VAC;  Surgeon: [name], MD;  Location: [place];  Service: Orthopedics;  Laterality: Right;   COLON SURGERY  01/30/2001   CORONARY STENT INTERVENTION N/A 08/12/2019   Procedure: CORONARY STENT INTERVENTION;  Surgeon: Nelva Bush, MD;  Location: [place] [facility];  Service: Cardiovascular;  Laterality: N/A;   CYSTOSCOPY/URETEROSCOPY/HOLMIUM LASER/STENT PLACEMENT Left 02/04/2019   Procedure: CYSTOSCOPY Left retrograde and attempted left ureteroscopy;  Surgeon: Raynelle Bring, MD;  Location: [facility];  Service: Urology;  Laterality: Left;  ONLY NEEDS 60 MIN    CYSTOSCOPY/URETEROSCOPY/HOLMIUM LASER/STENT PLACEMENT Left 11/07/2019   Procedure: CYSTOSCOPY/URETEROSCOPY/HOLMIUM LASER/STENT PLACEMENT;  Surgeon: Raynelle Bring, MD;  Location: [facility];  Service: Urology;  Laterality: Left;   FASCIOTOMY Right 01/24/2021   Procedure: FASCIOTOMY RIGHT LEG;  Surgeon: [name], MD;  Location: [place];  Service: Orthopedics;  Laterality: Right;   FRACTURE SURGERY     I & D EXTREMITY Right 01/27/2021   Procedure: IRRIGATION AND DEBRIDEMENT RIGHT LOWER EXTREMITY;  Surgeon: [name], MD;  Location: [place];  Service: Orthopedics;  Laterality: Right;   INTRAVASCULAR ULTRASOUND/IVUS N/A 08/12/2019   Procedure: Intravascular Ultrasound/IVUS;  Surgeon: Nelva Bush, MD;  Location: [place] [facility];  Service: Cardiovascular;  Laterality: N/A;   IR CONVERT LEFT NEPHROSTOMY TO NEPHROURETERAL CATH  09/17/2019   IR NEPHROSTOMY PLACEMENT LEFT  02/05/2019   IR NEPHROSTOMY PLACEMENT LEFT  08/06/2019   LEFT HEART CATH AND CORONARY ANGIOGRAPHY N/A 08/09/2019   Procedure: LEFT HEART CATH AND CORONARY ANGIOGRAPHY;  Surgeon: Jettie Booze, MD;  Location: [place] [facility];  Service: Cardiovascular;  Laterality: N/A;   SKIN BIOPSY     TONSILLECTOMY AND ADENOIDECTOMY      Medications: I have reviewed the patient's current medications. Allergies:  Allergies  Allergen Reactions   Codeine     Bad Headache    History reviewed. No pertinent family history. Social History:  reports that he quit smoking about 25 years ago. His smoking use included cigarettes. He has a 40.00 pack-year smoking history. He has never used  smokeless tobacco. He reports that he does not currently use alcohol. He reports that he does not use drugs.  Review of Systems  Genitourinary:  Positive for hematuria.  Musculoskeletal:  Positive for arthralgias.  All other systems reviewed and are negative.  Physical Exam:  Vital signs in last 24 hours: Temp:  [98.3 F (36.8 C)-98.7 F (37.1  C)] 98.6 F (37 C) (10/01 2124) Pulse Rate:  [73-76] 74 (10/01 2124) Resp:  [16-20] 18 (10/01 2124) BP: (109-129)/(60-64) 120/62 (10/01 2124) SpO2:  [99 %-100 %] 100 % (10/01 2124) Physical Exam Vitals reviewed.  Constitutional:      Appearance: Normal appearance.  HENT:     Head: Normocephalic and atraumatic.     Nose: Nose normal. No congestion.     Mouth/Throat:     Mouth: Mucous membranes are dry.  Eyes:     Extraocular Movements: Extraocular movements intact.     Pupils: Pupils are equal, round, and reactive to light.  Cardiovascular:     Rate and Rhythm: Normal rate and regular rhythm.  Pulmonary:     Effort: Pulmonary effort is normal. No respiratory distress.  Abdominal:     General: Abdomen is flat.     Palpations: Abdomen is soft.  Genitourinary:    Penis: Normal.      Testes: Normal.  Musculoskeletal:        General: Swelling present. Normal range of motion.  Skin:    General: Skin is warm and dry.  Neurological:     Mental Status: He is alert and oriented to person, place, and time. Mental status is at baseline.  Psychiatric:        Mood and Affect: Mood normal.        Behavior: Behavior normal.        Thought Content: Thought content normal.        Judgment: Judgment normal.    Laboratory Data:  Results for orders placed or performed during the hospital encounter of 01/24/21 (from the past 72 hour(s))  Basic metabolic panel     Status: Abnormal   Collection Time: 01/28/21  1:54 AM  Result Value Ref Range   Sodium 134 (L) 135 - 145 mmol/L   Potassium 5.0 3.5 - 5.1 mmol/L   Chloride 102 98 - 111 mmol/L   CO2 25 22 - 32 mmol/L   Glucose, Bld 137 (H) 70 - 99 mg/dL    Comment: Glucose reference range applies only to samples taken after fasting for at least 8 hours.   BUN 24 (H) 8 - 23 mg/dL   Creatinine, Ser 1.61 (H) 0.61 - 1.24 mg/dL   Calcium 8.9 8.9 - 10.3 mg/dL   GFR, Estimated 44 (L) >60 mL/min    Comment: (NOTE) Calculated using the CKD-EPI  Creatinine Equation (2021)    Anion gap 7 5 - 15    Comment: Performed at [facility] [address], [place], [place] [address]  CBC     Status: Abnormal   Collection Time: 01/28/21  1:54 AM  Result Value Ref Range   WBC 13.4 (H) 4.0 - 10.5 K/uL   RBC 3.45 (L) 4.22 - 5.81 MIL/uL   Hemoglobin 9.9 (L) 13.0 - 17.0 g/dL   HCT 30.4 (L) 39.0 - 52.0 %   MCV 88.1 80.0 - 100.0 fL   MCH 28.7 26.0 - 34.0 pg   MCHC 32.6 30.0 - 36.0 g/dL   RDW 14.2 11.5 - 15.5 %   Platelets 246 150 -  400 K/uL   nRBC 0.0 0.0 - 0.2 %    Comment: Performed at [facility], [place] [address], [place], [place] [address]  Magnesium     Status: None   Collection Time: 01/28/21  1:54 AM  Result Value Ref Range   Magnesium 2.1 1.7 - 2.4 mg/dL    Comment: Performed at [facility] [address] [place], [place] [address]  Basic metabolic panel     Status: Abnormal   Collection Time: 01/29/21  3:29 AM  Result Value Ref Range   Sodium 134 (L) 135 - 145 mmol/L   Potassium 4.6 3.5 - 5.1 mmol/L   Chloride 100 98 - 111 mmol/L   CO2 29 22 - 32 mmol/L   Glucose, Bld 113 (H) 70 - 99 mg/dL    Comment: Glucose reference range applies only to samples taken after fasting for at least 8 hours.   BUN 28 (H) 8 - 23 mg/dL   Creatinine, Ser 1.65 (H) 0.61 - 1.24 mg/dL   Calcium 8.8 (L) 8.9 - 10.3 mg/dL   GFR, Estimated 43 (L) >60 mL/min    Comment: (NOTE) Calculated using the CKD-EPI Creatinine Equation (2021)    Anion gap 5 5 - 15    Comment: Performed at [facility] [address], [place], [place] [address]  CBC     Status: Abnormal   Collection Time: 01/29/21  3:29 AM  Result Value Ref Range   WBC 12.8 (H) 4.0 - 10.5 K/uL   RBC 3.61 (L) 4.22 - 5.81 MIL/uL   Hemoglobin 10.3 (L) 13.0 - 17.0 g/dL   HCT 32.5 (L) 39.0 - 52.0 %   MCV 90.0 80.0 - 100.0 fL   MCH 28.5 26.0 - 34.0 pg   MCHC 31.7 30.0 - 36.0 g/dL   RDW 14.6 11.5 - 15.5 %   Platelets 275 150 - 400 K/uL   nRBC 0.2 0.0 - 0.2 %     Comment: Performed at [facility], [place] [address] [place], [place] [address]  Magnesium     Status: None   Collection Time: 01/29/21  3:29 AM  Result Value Ref Range   Magnesium 2.4 1.7 - 2.4 mg/dL    Comment: Performed at [facility] [address] [place], [place] [address]  Basic metabolic panel     Status: Abnormal   Collection Time: 01/30/21  2:57 AM  Result Value Ref Range   Sodium 133 (L) 135 - 145 mmol/L   Potassium 4.7 3.5 - 5.1 mmol/L   Chloride 100 98 - 111 mmol/L   CO2 27 22 - 32 mmol/L   Glucose, Bld 111 (H) 70 - 99 mg/dL    Comment: Glucose reference range applies only to samples taken after fasting for at least 8 hours.   BUN 33 (H) 8 - 23 mg/dL   Creatinine, Ser 1.84 (H) 0.61 - 1.24 mg/dL   Calcium 8.5 (L) 8.9 - 10.3 mg/dL   GFR, Estimated 37 (L) >60 mL/min    Comment: (NOTE) Calculated using the CKD-EPI Creatinine Equation (2021)    Anion gap 6 5 - 15    Comment: Performed at [facility] [address] [place] [address]  CBC     Status: Abnormal   Collection Time: 01/30/21  2:57 AM  Result Value Ref Range   WBC 13.5 (H) 4.0 - 10.5 K/uL   RBC 3.32 (L) 4.22 - 5.81 MIL/uL  Hemoglobin 9.7 (L) 13.0 - 17.0 g/dL   HCT 29.5 (L) 39.0 - 52.0 %   MCV 88.9 80.0 - 100.0 fL   MCH 29.2 26.0 - 34.0 pg   MCHC 32.9 30.0 - 36.0 g/dL   RDW 15.0 11.5 - 15.5 %   Platelets 280 150 - 400 K/uL   nRBC 0.4 (H) 0.0 - 0.2 %    Comment: Performed at [facility] [address], [place], [place] [address]  Magnesium     Status: None   Collection Time: 01/30/21  2:57 AM  Result Value Ref Range   Magnesium 2.4 1.7 - 2.4 mg/dL    Comment: Performed at [facility] [address] [place], [place] [address]   Recent Results (from the past 240 hour(s))  SARS Coronavirus 2 by RT PCR (hospital order, performed in [facility] hospital lab) Nasopharyngeal Nasopharyngeal Swab     Status: None   Collection Time: 01/24/21  5:05 PM   Specimen:  Nasopharyngeal Swab  Result Value Ref Range Status   SARS Coronavirus 2 NEGATIVE NEGATIVE Final    Comment: (NOTE) SARS-CoV-2 target nucleic acids are NOT DETECTED.  The SARS-CoV-2 RNA is generally detectable in upper and lower respiratory specimens during the acute phase of infection. The lowest concentration of SARS-CoV-2 viral copies this assay can detect is 250 copies / mL. A negative result does not preclude SARS-CoV-2 infection and should not be used as the sole basis for treatment or other patient management decisions.  A negative result may occur with improper specimen collection / handling, submission of specimen other than nasopharyngeal swab, presence of viral mutation(s) within the areas targeted by this assay, and inadequate number of viral copies (<250 copies / mL). A negative result must be combined with clinical observations, patient history, and epidemiological information.  Fact Sheet for Patients:   StrictlyIdeas.no  Fact Sheet for Healthcare Providers: BankingDealers.co.za  This test is not yet approved or  cleared by the [place] FDA and has been authorized for detection and/or diagnosis of SARS-CoV-2 by FDA under an Emergency Use Authorization (EUA).  This EUA will remain in effect (meaning this test can be used) for the duration of the COVID-19 declaration under Section 564(b)(1) of the Act, 21 U.S.C. section 360bbb-3(b)(1), unless the authorization is terminated or revoked sooner.  Performed at [facility], [place] [address] [place], [place] [address]   Surgical pcr screen     Status: None   Collection Time: 01/27/21  4:00 AM   Specimen: Nasal Mucosa; Nasal Swab  Result Value Ref Range Status   MRSA, PCR NEGATIVE NEGATIVE Final   Staphylococcus aureus NEGATIVE NEGATIVE Final    Comment: (NOTE) The Xpert SA Assay (FDA approved for NASAL specimens in patients 39 years of age and older), is one  component of a comprehensive surveillance program. It is not intended to diagnose infection nor to guide or monitor treatment. Performed at [facility], [place] [address] [place], [place] [address]    Creatinine: Recent Labs    01/24/21 1242 01/25/21 0445 01/26/21 0039 01/27/21 0355 01/28/21 0154 01/29/21 0329 01/30/21 0257  CREATININE 1.36* 1.45* 1.72* 1.50* 1.61* 1.65* 1.84*   Baseline Creatinine: 1.3  Impression/Assessment:  75yo with gross hematuria  Plan:  I replaced the foley with a 24 french 3 way hematuria catheter and 100cc of clot was evacuated from the bladder. The CBI was restarted. We will start finasteride 5mg  daily. Please continue to wean CBI  to off and please hold brilinta if possible  Nicolette Bang 01/30/2021, 11:09 PM

## 2021-01-30 NOTE — Plan of Care (Signed)

## 2021-01-30 NOTE — TOC Progression Note (Signed)
Transition of Care [facility]) - Progression Note    Patient Details  Name: [name] MRN: [id number] Date of Birth: 23-Sep-1945  Transition of Care [facility]) CM/SW [place], [place] Phone Number: 01/30/2021, 12:39 PM  Clinical Narrative:     SW met with pt at bedside to inform of current BO's. Pt reports he is only interested in [facility]. If they decline, pt wants to return home with [facility].   Expected Discharge Plan: Skilled Nursing Facility Barriers to Discharge: SNF Authorization Denied, SNF Pending bed offer  Expected Discharge Plan and Services Expected Discharge Plan: [place] arrangements for the past 2 months: Single Family Home                                       Social Determinants of Health (SDOH) Interventions    Readmission Risk Interventions No flowsheet data found.

## 2021-01-30 NOTE — Progress Notes (Signed)
PROGRESS NOTE    [name]  QZE:[id number] DOB: [birth year] DOA: 01/24/2021 PCP: [name], MD   Brief Narrative:  75 y.o. 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  In the hospital he was diagnosed with right lower extremity compartment syndrome and underwent emergent fasciotomy.  He is status post OR again on 2/26 for I&D, application of dermal skin graft and wound VAC placement.  Patient was seen by physical therapy who recommended SNF.   Assessment & Plan:   Active Problems:   Compartment syndrome (HCC)   Hematoma     Right lower extremity compartment syndrome -Status post emergent fasciotomy on 9/25 with repeat I&D and skin grafting with wound VAC placement on 9/28.  Increase his pain medications today.  Continue bowel regimen.  Will need outpatient follow-up and wound care management per orthopedic.  Resume aspirin and Brilinta -Gabapentin 300mg  tid  - PT-recommending SNF.  TOC consulted  Acute blood loss anemia - Baseline hemoglobin 13.0, slowly drifted down to 9.9.  Continue to monitor this   Syncope -This is resolved.   CKD IIIa -Creatinine is stable and at baseline 1.5. Gentle 500cc NS bolus today.    CAD with stenting Will resume aspirin and Brilinta (Discussed with Cardiology, Dr Percival Spanish, who prefers continuing Brilinta as well unless if he remains high risk).    HTN -Lisinopril 5 mg daily, metoprolol    Dyslipidemia -Daily Zetia    DVT prophylaxis: Lovenox Code Status: Full code Family Communication:    Status is: Inpatient  Remains inpatient appropriate because:Inpatient level of care appropriate due to severity of illness.  Awaiting SNF placement; available on Monday  Dispo: The patient is from: Home              Anticipated d/c is to: SNF              Patient  currently Is medically ready to be placed at SNF, he has a bed for Monday   Difficult to place patient No       Nutritional status  Nutrition Problem: Increased nutrient needs Etiology: wound healing, post-op healing  Signs/Symptoms: estimated needs  Interventions: MVI, Refer to RD note for recommendations, Magic cup, Premier Protein  Body mass index is 40.14 kg/m.           Subjective: Seen and examined at bedside, no new complaints.  Tells me his burning sensation is slightly better.  Review of Systems Otherwise negative except as per HPI, including: General: Denies fever, chills, night sweats or unintended weight loss. Resp: Denies cough, wheezing, shortness of breath. Cardiac: Denies chest pain, palpitations, orthopnea, paroxysmal nocturnal dyspnea. GI: Denies abdominal pain, nausea, vomiting, diarrhea or constipation GU: Denies dysuria, frequency, hesitancy or incontinence MS: Denies muscle aches, joint pain or swelling Neuro: Denies headache, neurologic deficits (focal weakness, numbness, tingling), abnormal gait Psych: Denies anxiety, depression, SI/HI/AVH Skin: Denies new rashes or lesions ID: Denies sick contacts, exotic exposures, travel  Physical Exam Constitutional: Not in acute distress Respiratory: Clear to auscultation bilaterally Cardiovascular: Normal sinus rhythm, no rubs Abdomen: Nontender nondistended good bowel sounds Musculoskeletal: No edema noted Skin: Right lower extremity dressing in place.  Swelling noted but he does have decent circulation and sensation Neurologic: CN 2-12 grossly intact.  And nonfocal Psychiatric: Normal judgment and insight. Alert and oriented x  3. Normal mood.  Objective: Vitals:   01/29/21 0818 01/29/21 1547 01/29/21 2100 01/30/21 0805  BP: (!) 113/53 (!) 128/59 (!) 96/57 109/64  Pulse: 63 87 88 73  Resp: 15 16 18 16   Temp: 97.6 F (36.4 C) 98.2 F (36.8 C) 98.4 F (36.9 C) 98.7 F (37.1 C)  TempSrc:  Oral Oral Oral Oral  SpO2: 99% 97% 96% 99%  Weight:      Height:        Intake/Output Summary (Last 24 hours) at 01/30/2021 0942 Last data filed at 01/29/2021 1700 Gross per 24 hour  Intake 360 ml  Output 500 ml  Net -140 ml   Filed Weights   01/24/21 1229  Weight: 119.7 kg     Data Reviewed:   CBC: Recent Labs  Lab 01/24/21 1242 01/25/21 0445 01/26/21 0039 01/27/21 0355 01/28/21 0154 01/29/21 0329 01/30/21 0257  WBC 9.1 13.3* 11.4* 9.4 13.4* 12.8* 13.5*  NEUTROABS 6.8 11.0* 7.4  --   --   --   --   HGB 14.8 12.5* 12.0* 11.8* 9.9* 10.3* 9.7*  HCT 44.2 38.2* 36.9* 36.5* 30.4* 32.5* 29.5*  MCV 85.5 87.4 88.5 89.2 88.1 90.0 88.9  PLT 248 255 217 241 246 275 854   Basic Metabolic Panel: Recent Labs  Lab 01/26/21 0039 01/27/21 0355 01/28/21 0154 01/29/21 0329 01/30/21 0257  NA 137 135 134* 134* 133*  K 4.6 4.2 5.0 4.6 4.7  CL 105 101 102 100 100  CO2 24 25 25 29 27   GLUCOSE 120* 88 137* 113* 111*  BUN 21 24* 24* 28* 33*  CREATININE 1.72* 1.50* 1.61* 1.65* 1.84*  CALCIUM 9.2 9.4 8.9 8.8* 8.5*  MG 2.1 2.0 2.1 2.4 2.4  PHOS 3.6 4.3  --   --   --    GFR: Estimated Creatinine Clearance: 42.3 mL/min (A) (by C-G formula based on SCr of 1.84 mg/dL (H)). Liver Function Tests: Recent Labs  Lab 01/26/21 0039  AST 15  ALT 16  ALKPHOS 55  BILITOT 0.7  PROT 5.6*  ALBUMIN 2.9*   No results for input(s): LIPASE, AMYLASE in the last 168 hours. No results for input(s): AMMONIA in the last 168 hours. Coagulation Profile: No results for input(s): INR, PROTIME in the last 168 hours. Cardiac Enzymes: Recent Labs  Lab 01/24/21 1242  CKTOTAL 77   BNP (last 3 results) No results for input(s): PROBNP in the last 8760 hours. HbA1C: No results for input(s): HGBA1C in the last 72 hours. CBG: No results for input(s): GLUCAP in the last 168 hours. Lipid Profile: No results for input(s): CHOL, HDL, LDLCALC, TRIG, CHOLHDL, LDLDIRECT in the last 72 hours. Thyroid  Function Tests: No results for input(s): TSH, T4TOTAL, FREET4, T3FREE, THYROIDAB in the last 72 hours. Anemia Panel: No results for input(s): VITAMINB12, FOLATE, FERRITIN, TIBC, IRON, RETICCTPCT in the last 72 hours. Sepsis Labs: No results for input(s): PROCALCITON, LATICACIDVEN in the last 168 hours.  Recent Results (from the past 240 hour(s))  SARS Coronavirus 2 by RT PCR (hospital order, performed in [facility] hospital lab) Nasopharyngeal Nasopharyngeal Swab     Status: None   Collection Time: 01/24/21  5:05 PM   Specimen: Nasopharyngeal Swab  Result Value Ref Range Status   SARS Coronavirus 2 NEGATIVE NEGATIVE Final    Comment: (NOTE) SARS-CoV-2 target nucleic acids are NOT DETECTED.  The SARS-CoV-2 RNA is generally detectable in upper and lower respiratory specimens during the acute phase of infection. The lowest concentration of SARS-CoV-2  viral copies this assay can detect is 250 copies / mL. A negative result does not preclude SARS-CoV-2 infection and should not be used as the sole basis for treatment or other patient management decisions.  A negative result may occur with improper specimen collection / handling, submission of specimen other than nasopharyngeal swab, presence of viral mutation(s) within the areas targeted by this assay, and inadequate number of viral copies (<250 copies / mL). A negative result must be combined with clinical observations, patient history, and epidemiological information.  Fact Sheet for Patients:   StrictlyIdeas.no  Fact Sheet for Healthcare Providers: BankingDealers.co.za  This test is not yet approved or  cleared by the [place] FDA and has been authorized for detection and/or diagnosis of SARS-CoV-2 by FDA under an Emergency Use Authorization (EUA).  This EUA will remain in effect (meaning this test can be used) for the duration of the COVID-19 declaration under Section 564(b)(1)  of the Act, 21 U.S.C. section 360bbb-3(b)(1), unless the authorization is terminated or revoked sooner.  Performed at [facility], [place] [address], [place], [place] [address]   Surgical pcr screen     Status: None   Collection Time: 01/27/21  4:00 AM   Specimen: Nasal Mucosa; Nasal Swab  Result Value Ref Range Status   MRSA, PCR NEGATIVE NEGATIVE Final   Staphylococcus aureus NEGATIVE NEGATIVE Final    Comment: (NOTE) The Xpert SA Assay (FDA approved for NASAL specimens in patients 16 years of age and older), is one component of a comprehensive surveillance program. It is not intended to diagnose infection nor to guide or monitor treatment. Performed at [facility], [place] [address] [place], [place] [address]          Radiology Studies: No results found.      Scheduled Meds:  aspirin EC  81 mg Oral Daily   enoxaparin (LOVENOX) injection  40 mg Subcutaneous Q24H   ezetimibe  10 mg Oral Daily   gabapentin  300 mg Oral TID   lisinopril  5 mg Oral Daily   multivitamin with minerals  1 tablet Oral Daily   Ensure Max Protein  11 oz Oral QHS   rosuvastatin  5 mg Oral Once per day on Mon Thu   ticagrelor  60 mg Oral BID   Continuous Infusions:  sodium chloride     sodium chloride       LOS: 6 days   Time spent= 35 mins    Jalil Lorusso Arsenio Loader, MD [facility]  If 7PM-7AM, please contact night-coverage  01/30/2021, 9:42 AM

## 2021-01-31 DIAGNOSIS — R310 Gross hematuria: Secondary | ICD-10-CM | POA: Diagnosis not present

## 2021-01-31 DIAGNOSIS — T79A21D Traumatic compartment syndrome of right lower extremity, subsequent encounter: Secondary | ICD-10-CM | POA: Diagnosis not present

## 2021-01-31 LAB — CBC
HCT: 28 % — ABNORMAL LOW (ref 39.0–52.0)
Hemoglobin: 9.2 g/dL — ABNORMAL LOW (ref 13.0–17.0)
MCH: 29.2 pg (ref 26.0–34.0)
MCHC: 32.9 g/dL (ref 30.0–36.0)
MCV: 88.9 fL (ref 80.0–100.0)
Platelets: 272 10*3/uL (ref 150–400)
RBC: 3.15 MIL/uL — ABNORMAL LOW (ref 4.22–5.81)
RDW: 15.2 % (ref 11.5–15.5)
WBC: 12.2 10*3/uL — ABNORMAL HIGH (ref 4.0–10.5)
nRBC: 0.2 % (ref 0.0–0.2)

## 2021-01-31 LAB — BASIC METABOLIC PANEL
Anion gap: 6 (ref 5–15)
BUN: 39 mg/dL — ABNORMAL HIGH (ref 8–23)
CO2: 27 mmol/L (ref 22–32)
Calcium: 8.6 mg/dL — ABNORMAL LOW (ref 8.9–10.3)
Chloride: 103 mmol/L (ref 98–111)
Creatinine, Ser: 1.6 mg/dL — ABNORMAL HIGH (ref 0.61–1.24)
GFR, Estimated: 44 mL/min — ABNORMAL LOW (ref >60)
Glucose, Bld: 117 mg/dL — ABNORMAL HIGH (ref 70–99)
Potassium: 4.6 mmol/L (ref 3.5–5.1)
Sodium: 136 mmol/L (ref 135–145)

## 2021-01-31 LAB — MAGNESIUM: Magnesium: 2.3 mg/dL (ref 1.7–2.4)

## 2021-01-31 MED ORDER — FINASTERIDE 5 MG PO TABS
5.0000 mg | ORAL_TABLET | Freq: Every day | ORAL | Status: DC
  Administered 2021-01-31 – 2021-02-11 (×11): 5 mg via ORAL
  Filled 2021-01-31 (×11): qty 1

## 2021-01-31 MED ORDER — SODIUM CHLORIDE 0.9 % IR SOLN
3000.0000 mL | Status: DC
  Administered 2021-01-31 – 2021-02-02 (×19): 3000 mL

## 2021-01-31 MED ORDER — FUROSEMIDE 10 MG/ML IJ SOLN
40.0000 mg | Freq: Once | INTRAMUSCULAR | Status: AC
  Administered 2021-01-31: 40 mg via INTRAVENOUS
  Filled 2021-01-31: qty 4

## 2021-01-31 NOTE — Progress Notes (Signed)
4 Days Post-Op Subjective: Foley catheter was intermitently draining this morning. 150cc of clot was irrigated from the bladder.  Objective: Vital signs in last 24 hours: Temp:  [98.3 F (36.8 C)-98.6 F (37 C)] 98.3 F (36.8 C) (10/02 0816) Pulse Rate:  [73-74] 73 (10/02 0816) Resp:  [18] 18 (10/02 0816) BP: (120-121)/(62-71) 121/71 (10/02 0816) SpO2:  [100 %] 100 % (10/02 0816)  Intake/Output from previous day: 10/01 0701 - 10/02 0700 In: 500 [IV Piggyback:500] Out: 1850 [Urine:1850] Intake/Output this shift: No intake/output data recorded.  Physical Exam:  General:alert, cooperative, and appears stated age GI: soft, non tender, normal bowel sounds, no palpable masses, no organomegaly, no inguinal hernia Male genitalia: not done Extremities: edema bilateral lower extremities  Lab Results: Recent Labs    01/29/21 0329 01/30/21 0257 01/31/21 0605  HGB 10.3* 9.7* 9.2*  HCT 32.5* 29.5* 28.0*   BMET Recent Labs    01/30/21 0257 01/31/21 0605  NA 133* 136  K 4.7 4.6  CL 100 103  CO2 27 27  GLUCOSE 111* 117*  BUN 33* 39*  CREATININE 1.84* 1.60*  CALCIUM 8.5* 8.6*   No results for input(s): LABPT, INR in the last 72 hours. No results for input(s): LABURIN in the last 72 hours. Results for orders placed or performed during the hospital encounter of 01/24/21  SARS Coronavirus 2 by RT PCR (hospital order, performed in [facility] hospital lab) Nasopharyngeal Nasopharyngeal Swab     Status: None   Collection Time: 01/24/21  5:05 PM   Specimen: Nasopharyngeal Swab  Result Value Ref Range Status   SARS Coronavirus 2 NEGATIVE NEGATIVE Final    Comment: (NOTE) SARS-CoV-2 target nucleic acids are NOT DETECTED.  The SARS-CoV-2 RNA is generally detectable in upper and lower respiratory specimens during the acute phase of infection. The lowest concentration of SARS-CoV-2 viral copies this assay can detect is 250 copies / mL. A negative result does not preclude  SARS-CoV-2 infection and should not be used as the sole basis for treatment or other patient management decisions.  A negative result may occur with improper specimen collection / handling, submission of specimen other than nasopharyngeal swab, presence of viral mutation(s) within the areas targeted by this assay, and inadequate number of viral copies (<250 copies / mL). A negative result must be combined with clinical observations, patient history, and epidemiological information.  Fact Sheet for Patients:   StrictlyIdeas.no  Fact Sheet for Healthcare Providers: BankingDealers.co.za  This test is not yet approved or  cleared by the [place] FDA and has been authorized for detection and/or diagnosis of SARS-CoV-2 by FDA under an Emergency Use Authorization (EUA).  This EUA will remain in effect (meaning this test can be used) for the duration of the COVID-19 declaration under Section 564(b)(1) of the Act, 21 U.S.C. section 360bbb-3(b)(1), unless the authorization is terminated or revoked sooner.  Performed at [facility], [place] [address], [place], [place] [address]   Surgical pcr screen     Status: None   Collection Time: 01/27/21  4:00 AM   Specimen: Nasal Mucosa; Nasal Swab  Result Value Ref Range Status   MRSA, PCR NEGATIVE NEGATIVE Final   Staphylococcus aureus NEGATIVE NEGATIVE Final    Comment: (NOTE) The Xpert SA Assay (FDA approved for NASAL specimens in patients 68 years of age and older), is one component of a comprehensive surveillance program. It is not intended to diagnose infection nor to guide or monitor treatment. Performed at [facility], [address] [place], [place] [address]     Studies/Results: No results found.  Assessment/Plan: 75yo with BPH and gross  hematuria Continue to wean CBI to off. Urology to continue to follow   LOS: 7 days   Nicolette Bang 01/31/2021, 7:38  PM

## 2021-01-31 NOTE — Progress Notes (Signed)
0600: 3 way cath placed with immediate return of 800 ccs of blood. Attempted to page Urology

## 2021-01-31 NOTE — Progress Notes (Signed)
Patient with no output from foley in several hours. Informed hospitalist and given orders to flush catheter and if this did not work to change it out. Still waiting on 3 way cath after several attempts

## 2021-01-31 NOTE — Progress Notes (Signed)
PROGRESS NOTE    [name]  RXV:[id number] DOB: 08-22-1945 DOA: 01/24/2021 PCP: [name], MD   Brief Narrative:  75 y.o. 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  In the hospital he was diagnosed with right lower extremity compartment syndrome and underwent emergent fasciotomy.  He is status post OR again on 5/09 for I&D, application of dermal skin graft and wound VAC placement.  Patient was seen by physical therapy who recommended SNF.  While awaiting SNF placement he developed hematuria with urinary retention, Foley catheter has to be placed.  Urology team consulted.   Assessment & Plan:   Active Problems:   Compartment syndrome (HCC)   Hematoma  Persistent hematuria with acute urinary retention - Three-way Foley catheter placed.  Difficult to flush this, urology consulted.  Will need to start CBI.  Hold Brilinta   Right lower extremity compartment syndrome -Status post emergent fasciotomy on 9/25 with repeat I&D and skin grafting with wound VAC placement on 9/28.  Increase his pain medications today.  Continue bowel regimen.  Will need outpatient follow-up and wound care management per orthopedic.  Resume aspirin and Brilinta -Gabapentin 300mg  tid  - PT-recommending SNF.  TOC consulted  Acute blood loss anemia - Baseline hemoglobin 13.0, slowly drifted down to 9.9.  Continue to monitor this   Syncope -This is resolved.   CKD IIIa -Creatinine is stable and at baseline 1.5. Gentle 500cc NS bolus today.    CAD with stenting Initially resumed aspirin and Brilinta (Discussed with Cardiology, Dr Percival Spanish, who prefers continuing Brilinta as well unless if he remains high risk).  But now due to hematuria, I will discontinue Brilinta, will also hold subcu Lovenox   HTN -Lisinopril 5 mg daily,  metoprolol    Dyslipidemia -Daily Zetia    DVT prophylaxis: Lovenox, subcu Lovenox on hold.  Unable to place SCDs due to his surgery. Code Status: Full code Family Communication:    Status is: Inpatient  Remains inpatient appropriate because:Inpatient level of care appropriate due to severity of illness.  Awaiting SNF placement; available on Monday  Dispo: The patient is from: Home              Anticipated d/c is to: SNF              Patient currently Is medically ready to be placed at SNF, he has a bed for Monday   Difficult to place patient No       Nutritional status  Nutrition Problem: Increased nutrient needs Etiology: wound healing, post-op healing  Signs/Symptoms: estimated needs  Interventions: MVI, Refer to RD note for recommendations, Magic cup, Premier Protein  Body mass index is 40.14 kg/m.           Subjective: Seen and examined at bedside, having some lower abdominal discomfort.  Yesterday evening patient started having some hematuria, passing clots followed by urinary retention.  Foley catheter has to be placed with difficulty.  Eventually it was changed to three-way catheter.  Urology was consulted.  This morning it is difficult to flush, urology team is aware who will assist further. Patient states his right lower extremity discomfort has improved. Review of Systems Otherwise negative except as per HPI, including: General: Denies fever, chills, night sweats or unintended weight loss. Resp: Denies cough, wheezing, shortness of breath.  Cardiac: Denies chest pain, palpitations, orthopnea, paroxysmal nocturnal dyspnea. GI: Denies abdominal pain, nausea, vomiting, diarrhea or constipation GU: Urinary retention with hematuria MS: Denies muscle aches, joint pain or swelling Neuro: Denies headache, neurologic deficits (focal weakness, numbness, tingling), abnormal gait Psych: Denies anxiety, depression, SI/HI/AVH Skin: Denies new rashes or  lesions ID: Denies sick contacts, exotic exposures, travel  Physical Exam Constitutional: Not in acute distress Respiratory: Clear to auscultation bilaterally Cardiovascular: Normal sinus rhythm, no rubs Abdomen: Nontender nondistended good bowel sounds Musculoskeletal: No edema noted Skin: Right lower extremity dressing in place Neurologic: CN 2-12 grossly intact.  And nonfocal Psychiatric: Normal judgment and insight. Alert and oriented x 3. Normal mood. Three-way Foley catheter in place, difficult to flush.  Bladder scan shows > 375cc  Objective: Vitals:   01/30/21 0805 01/30/21 1610 01/30/21 2124 01/31/21 0816  BP: 109/64 129/60 120/62 121/71  Pulse: 73 76 74   Resp: 16 20 18    Temp: 98.7 F (37.1 C) 98.3 F (36.8 C) 98.6 F (37 C)   TempSrc: Oral Oral Oral   SpO2: 99% 100% 100%   Weight:      Height:        Intake/Output Summary (Last 24 hours) at 01/31/2021 0901 Last data filed at 01/31/2021 2993 Gross per 24 hour  Intake 500 ml  Output 1850 ml  Net -1350 ml   Filed Weights   01/24/21 1229  Weight: 119.7 kg     Data Reviewed:   CBC: Recent Labs  Lab 01/24/21 1242 01/25/21 0445 01/26/21 0039 01/27/21 0355 01/28/21 0154 01/29/21 0329 01/30/21 0257 01/31/21 0605  WBC 9.1 13.3* 11.4* 9.4 13.4* 12.8* 13.5* 12.2*  NEUTROABS 6.8 11.0* 7.4  --   --   --   --   --   HGB 14.8 12.5* 12.0* 11.8* 9.9* 10.3* 9.7* 9.2*  HCT 44.2 38.2* 36.9* 36.5* 30.4* 32.5* 29.5* 28.0*  MCV 85.5 87.4 88.5 89.2 88.1 90.0 88.9 88.9  PLT 248 255 217 241 246 275 280 716   Basic Metabolic Panel: Recent Labs  Lab 01/26/21 0039 01/27/21 0355 01/28/21 0154 01/29/21 0329 01/30/21 0257 01/31/21 0605  NA 137 135 134* 134* 133* 136  K 4.6 4.2 5.0 4.6 4.7 4.6  CL 105 101 102 100 100 103  CO2 24 25 25 29 27 27   GLUCOSE 120* 88 137* 113* 111* 117*  BUN 21 24* 24* 28* 33* 39*  CREATININE 1.72* 1.50* 1.61* 1.65* 1.84* 1.60*  CALCIUM 9.2 9.4 8.9 8.8* 8.5* 8.6*  MG 2.1 2.0 2.1 2.4  2.4 2.3  PHOS 3.6 4.3  --   --   --   --    GFR: Estimated Creatinine Clearance: 48.7 mL/min (A) (by C-G formula based on SCr of 1.6 mg/dL (H)). Liver Function Tests: Recent Labs  Lab 01/26/21 0039  AST 15  ALT 16  ALKPHOS 55  BILITOT 0.7  PROT 5.6*  ALBUMIN 2.9*   No results for input(s): LIPASE, AMYLASE in the last 168 hours. No results for input(s): AMMONIA in the last 168 hours. Coagulation Profile: No results for input(s): INR, PROTIME in the last 168 hours. Cardiac Enzymes: Recent Labs  Lab 01/24/21 1242  CKTOTAL 77   BNP (last 3 results) No results for input(s): PROBNP in the last 8760 hours. HbA1C: No results for input(s): HGBA1C in the last 72 hours. CBG: No results for input(s): GLUCAP in the last 168 hours. Lipid Profile: No results for input(s): CHOL, HDL, LDLCALC, TRIG, CHOLHDL, LDLDIRECT in the  last 72 hours. Thyroid Function Tests: No results for input(s): TSH, T4TOTAL, FREET4, T3FREE, THYROIDAB in the last 72 hours. Anemia Panel: No results for input(s): VITAMINB12, FOLATE, FERRITIN, TIBC, IRON, RETICCTPCT in the last 72 hours. Sepsis Labs: No results for input(s): PROCALCITON, LATICACIDVEN in the last 168 hours.  Recent Results (from the past 240 hour(s))  SARS Coronavirus 2 by RT PCR (hospital order, performed in [facility] hospital lab) Nasopharyngeal Nasopharyngeal Swab     Status: None   Collection Time: 01/24/21  5:05 PM   Specimen: Nasopharyngeal Swab  Result Value Ref Range Status   SARS Coronavirus 2 NEGATIVE NEGATIVE Final    Comment: (NOTE) SARS-CoV-2 target nucleic acids are NOT DETECTED.  The SARS-CoV-2 RNA is generally detectable in upper and lower respiratory specimens during the acute phase of infection. The lowest concentration of SARS-CoV-2 viral copies this assay can detect is 250 copies / mL. A negative result does not preclude SARS-CoV-2 infection and should not be used as the sole basis for treatment or other patient  management decisions.  A negative result may occur with improper specimen collection / handling, submission of specimen other than nasopharyngeal swab, presence of viral mutation(s) within the areas targeted by this assay, and inadequate number of viral copies (<250 copies / mL). A negative result must be combined with clinical observations, patient history, and epidemiological information.  Fact Sheet for Patients:   StrictlyIdeas.no  Fact Sheet for Healthcare Providers: BankingDealers.co.za  This test is not yet approved or  cleared by the [place] FDA and has been authorized for detection and/or diagnosis of SARS-CoV-2 by FDA under an Emergency Use Authorization (EUA).  This EUA will remain in effect (meaning this test can be used) for the duration of the COVID-19 declaration under Section 564(b)(1) of the Act, 21 U.S.C. section 360bbb-3(b)(1), unless the authorization is terminated or revoked sooner.  Performed at [facility], [place] [address] [place], [place] [address]   Surgical pcr screen     Status: None   Collection Time: 01/27/21  4:00 AM   Specimen: Nasal Mucosa; Nasal Swab  Result Value Ref Range Status   MRSA, PCR NEGATIVE NEGATIVE Final   Staphylococcus aureus NEGATIVE NEGATIVE Final    Comment: (NOTE) The Xpert SA Assay (FDA approved for NASAL specimens in patients 11 years of age and older), is one component of a comprehensive surveillance program. It is not intended to diagnose infection nor to guide or monitor treatment. Performed at [facility], [place] [address] [place],  [address]          Radiology Studies: No results found.      Scheduled Meds:  ALPRAZolam  0.5 mg Oral Once   aspirin EC  81 mg Oral Daily   Chlorhexidine Gluconate Cloth  6 each Topical Daily   enoxaparin (LOVENOX) injection  40 mg Subcutaneous Q24H   ezetimibe  10 mg Oral Daily   gabapentin  300 mg Oral  TID   lisinopril  5 mg Oral Daily   multivitamin with minerals  1 tablet Oral Daily   Ensure Max Protein  11 oz Oral QHS   rosuvastatin  5 mg Oral Once per day on Mon Thu   ticagrelor  60 mg Oral BID   Continuous Infusions:  sodium chloride     sodium chloride irrigation       LOS: 7 days   Time spent= 35 mins    Niobe Dick Arsenio Loader, MD [facility]  If  7PM-7AM, please contact night-coverage  01/31/2021, 9:01 AM

## 2021-02-01 DIAGNOSIS — R319 Hematuria, unspecified: Secondary | ICD-10-CM

## 2021-02-01 DIAGNOSIS — T79A21D Traumatic compartment syndrome of right lower extremity, subsequent encounter: Secondary | ICD-10-CM | POA: Diagnosis not present

## 2021-02-01 DIAGNOSIS — N390 Urinary tract infection, site not specified: Secondary | ICD-10-CM

## 2021-02-01 DIAGNOSIS — T148XXA Other injury of unspecified body region, initial encounter: Secondary | ICD-10-CM | POA: Diagnosis not present

## 2021-02-01 LAB — CBC
HCT: 28.1 % — ABNORMAL LOW (ref 39.0–52.0)
Hemoglobin: 9.2 g/dL — ABNORMAL LOW (ref 13.0–17.0)
MCH: 29.4 pg (ref 26.0–34.0)
MCHC: 32.7 g/dL (ref 30.0–36.0)
MCV: 89.8 fL (ref 80.0–100.0)
Platelets: 314 10*3/uL (ref 150–400)
RBC: 3.13 MIL/uL — ABNORMAL LOW (ref 4.22–5.81)
RDW: 15.1 % (ref 11.5–15.5)
WBC: 14.7 10*3/uL — ABNORMAL HIGH (ref 4.0–10.5)
nRBC: 0 % (ref 0.0–0.2)

## 2021-02-01 LAB — MAGNESIUM: Magnesium: 2.3 mg/dL (ref 1.7–2.4)

## 2021-02-01 LAB — BASIC METABOLIC PANEL
Anion gap: 6 (ref 5–15)
BUN: 39 mg/dL — ABNORMAL HIGH (ref 8–23)
CO2: 26 mmol/L (ref 22–32)
Calcium: 8.4 mg/dL — ABNORMAL LOW (ref 8.9–10.3)
Chloride: 101 mmol/L (ref 98–111)
Creatinine, Ser: 1.77 mg/dL — ABNORMAL HIGH (ref 0.61–1.24)
GFR, Estimated: 39 mL/min — ABNORMAL LOW (ref >60)
Glucose, Bld: 123 mg/dL — ABNORMAL HIGH (ref 70–99)
Potassium: 5.2 mmol/L — ABNORMAL HIGH (ref 3.5–5.1)
Sodium: 133 mmol/L — ABNORMAL LOW (ref 135–145)

## 2021-02-01 MED ORDER — SODIUM ZIRCONIUM CYCLOSILICATE 10 G PO PACK
10.0000 g | PACK | Freq: Once | ORAL | Status: AC
  Administered 2021-02-01: 10 g via ORAL
  Filled 2021-02-01: qty 1

## 2021-02-01 NOTE — Progress Notes (Signed)
Called to bedside due to catheter obstruction despite nursing's best efforts to irrigate.  I was able to irrigate a small clot with no other significant clot burden or debris.  Patient felt immediate relief.  Extensively hand-irrigated until urine returned clear to light pink.  CBI restarted.    Ellison Hughs, MD  Urology

## 2021-02-01 NOTE — TOC Progression Note (Addendum)
Transition of Care [facility]) - Initial/Assessment Note    Patient Details  Name: [name] MRN: [id number] Date of Birth: Jul 11, 1945  Transition of Care [facility]) CM/SW Contact:    [name], [place] Phone Number: 02/01/2021, 10:26 AM  Clinical Narrative:                 10:26-  [facility]'s ability to accept the patient.  The patient reports that he will only go to [place] or home.  The facility is reviewing the patient and will call CSW with the decision.    11:00-   CSW notified that the facility can accept if the patient will be discharged with a Prevena wound Vac  CSW spoke with the RN who confirmed that the patient will be going with a Prevena wound vac.  CSW notified the facility  and they can accept the patient.  CSW requested that [facility] CMA's begin insurance auth.    TOC barriers to d/c- insurance authorization   Patient Goals and CMS Choice Patient states their goals for this hospitalization and ongoing recovery are:: To go to rehab and get better so that he can go home to his wife CMS Medicare.gov Compare Post Acute Care list provided to:: Patient Choice offered to / list presented to : Patient  Expected Discharge Plan and Services Expected Discharge Plan: [place]       Living arrangements for the past 2 months: Single Family Home                                      Prior Living Arrangements/Services Living arrangements for the past 2 months: Single Family Home Lives with:: Adult Children, Spouse Patient language and need for interpreter reviewed:: Yes Do you feel safe going back to the place where you live?: Yes      Need for Family Participation in Patient Care: Yes (Comment) Care giver support system in place?: Yes (comment)   Criminal Activity/Legal Involvement Pertinent to Current Situation/Hospitalization: No - Comment as needed  Activities of Daily Living Home Assistive  Devices/Equipment: None ADL Screening (condition at time of admission) Patient's cognitive ability adequate to safely complete daily activities?: Yes Is the patient deaf or have difficulty hearing?: No Does the patient have difficulty seeing, even when wearing glasses/contacts?: No Does the patient have difficulty concentrating, remembering, or making decisions?: No Patient able to express need for assistance with ADLs?: Yes Does the patient have difficulty dressing or bathing?: No Independently performs ADLs?: Yes (appropriate for developmental age) Does the patient have difficulty walking or climbing stairs?: No Weakness of Legs: None Weakness of Arms/Hands: None  Permission Sought/Granted   Permission granted to share information with : Yes, Verbal Permission Granted     Permission granted to share info w AGENCY: SNF        Emotional Assessment Appearance:: Appears stated age Attitude/Demeanor/Rapport: Engaged Affect (typically observed): Accepting, Pleasant Orientation: : Oriented to  Time, Oriented to Situation, Oriented to Place, Oriented to Self Alcohol / Substance Use: Not Applicable Psych Involvement: No (comment)  Admission diagnosis:  Hematoma [T14.8XXA] Compartment syndrome ([place]) [T79.A0XA] Patient Active Problem List   Diagnosis Date Noted   Hematoma    Compartment syndrome ([place]) 01/24/2021   Myalgia due to statin 03/12/2020   Coronary artery disease due to lipid rich plaque    Non-ST elevation (NSTEMI) myocardial  infarction [facility])    Hypertensive emergency    Elevated troponin    Leg edema    Nephrolithiasis 08/04/2019   AKI (acute kidney injury) ([place]) 08/04/2019   Hypokalemia 08/04/2019   infected Renal calculus, left 02/04/2019   PCP:  [name], MD Pharmacy:   [facility] [place], [place] - [address] [place] [address] Phone: [phone] Fax:  [place], [place] - [address] [place] [address] Phone: [phone] Fax: [phone]     Social Determinants of Health (SDOH) Interventions    Readmission Risk Interventions No flowsheet data found.

## 2021-02-01 NOTE — Progress Notes (Signed)
PT Cancellation Note  Patient Details Name: [name] MRN: [id number] DOB: February 01, 1946   Cancelled Treatment:    Reason Eval/Treat Not Completed: Pain limiting ability to participate Attempted to see pt this PM however not able to participate due to worsening pain in lower abdomen despite being premedicated. RN needing to irrigate bladder. Will follow.   Marguarite Arbour A Domini Vandehei 02/01/2021, 4:37 PM Marisa Severin, PT, DPT Acute Rehabilitation Services Pager [phone] Office [phone]

## 2021-02-01 NOTE — Progress Notes (Signed)
PROGRESS NOTE    [name]  VPX:[id number] DOB: [birth year] DOA: 01/24/2021 PCP: [name], MD   Brief Narrative:  75 y.o. 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  In the hospital he was diagnosed with right lower extremity compartment syndrome and underwent emergent fasciotomy.  He is status post OR again on 4/62 for I&D, application of dermal skin graft and wound VAC placement.  Patient was seen by physical therapy who recommended SNF.  While awaiting SNF placement he developed hematuria with urinary retention, Foley catheter has to be placed.  Urology team consulted and patient was started on continuous bladder irrigation.   Assessment & Plan:   Active Problems:   Compartment syndrome ([place])   Hematoma  Persistent hematuria with acute urinary retention -Urology consulted, started on CBI.  Holding Brilinta.   Right lower extremity compartment syndrome -Status post emergent fasciotomy on 9/25 with repeat I&D and skin grafting with wound VAC placement on 9/28.  Increase his pain medications today.  Continue bowel regimen.  Will need outpatient follow-up and wound care management per orthopedic.  Resume aspirin and Brilinta -Gabapentin 300mg  tid  - PT-recommending SNF.  TOC consulted  Acute blood loss anemia - Baseline hemoglobin 13.0, slowly drifted down to 9.9.  Continue to monitor this   Syncope -This is resolved.   CKD IIIa -Creatinine is stable and at baseline 1.5.    CAD with stenting Initially resumed aspirin and Brilinta (Discussed with Cardiology, Dr Percival Spanish, who prefers continuing Brilinta as well unless if he remains high risk).  But now due to hematuria, I will discontinue Brilinta, will also hold subcu Lovenox   HTN -Lisinopril 5 mg daily, metoprolol    Dyslipidemia -Daily  Zetia    DVT prophylaxis: Lovenox, subcu Lovenox on hold.  Unable to place SCDs due to his surgery. Code Status: Full code Family Communication:    Status is: Inpatient  Remains inpatient appropriate because:Inpatient level of care appropriate due to severity of illness.  Ongoing hematuria, maintain hospital stay  Dispo: The patient is from: Home              Anticipated d/c is to: SNF              Patient currently Is not medically ready, ongoing hematuria.  Maintain hospital stay   Difficult to place patient No       Nutritional status  Nutrition Problem: Increased nutrient needs Etiology: wound healing, post-op healing  Signs/Symptoms: estimated needs  Interventions: MVI, Refer to RD note for recommendations, Magic cup, Premier Protein  Body mass index is 40.14 kg/m.           Subjective: Continuous bladder irrigation started yesterday's, still having hematuria.  Review of Systems Otherwise negative except as per HPI, including: General: Denies fever, chills, night sweats or unintended weight loss. Resp: Denies cough, wheezing, shortness of breath. Cardiac: Denies chest pain, palpitations, orthopnea, paroxysmal nocturnal dyspnea. GI: Denies abdominal pain, nausea, vomiting, diarrhea or constipation GU: Denies dysuria, frequency, hesitancy or incontinence MS: Denies muscle aches, joint pain or swelling Neuro: Denies headache, neurologic deficits (focal weakness, numbness, tingling), abnormal gait Psych: Denies anxiety, depression, SI/HI/AVH Skin: Denies new rashes or lesions ID: Denies sick contacts, exotic exposures, travel  Physical Exam Constitutional: Not in acute distress Respiratory: Clear to auscultation bilaterally Cardiovascular:  Normal sinus rhythm, no rubs Abdomen: Nontender nondistended good bowel sounds Musculoskeletal: No edema noted Skin: Right lower extremity dressing in place Neurologic: CN 2-12 grossly intact.  And  nonfocal Psychiatric: Normal judgment and insight. Alert and oriented x 3. Normal mood. Still has hematuria in the Foley catheter  Objective: Vitals:   01/30/21 2124 01/31/21 0816 01/31/21 2100 02/01/21 0837  BP: 120/62 121/71 (!) 98/57 (!) 104/52  Pulse: 74 73 88 72  Resp: 18 18 18 17   Temp: 98.6 F (37 C) 98.3 F (36.8 C) 98.6 F (37 C) 97.8 F (36.6 C)  TempSrc: Oral Oral Oral Oral  SpO2: 100% 100% 95% 97%  Weight:      Height:        Intake/Output Summary (Last 24 hours) at 02/01/2021 1236 Last data filed at 02/01/2021 0658 Gross per 24 hour  Intake 24000 ml  Output 26715 ml  Net -2715 ml   Filed Weights   01/24/21 1229  Weight: 119.7 kg     Data Reviewed:   CBC: Recent Labs  Lab 01/26/21 0039 01/27/21 0355 01/28/21 0154 01/29/21 0329 01/30/21 0257 01/31/21 0605 02/01/21 0244  WBC 11.4*   < > 13.4* 12.8* 13.5* 12.2* 14.7*  NEUTROABS 7.4  --   --   --   --   --   --   HGB 12.0*   < > 9.9* 10.3* 9.7* 9.2* 9.2*  HCT 36.9*   < > 30.4* 32.5* 29.5* 28.0* 28.1*  MCV 88.5   < > 88.1 90.0 88.9 88.9 89.8  PLT 217   < > 246 275 280 272 314   < > = values in this interval not displayed.   Basic Metabolic Panel: Recent Labs  Lab 01/26/21 0039 01/27/21 0355 01/28/21 0154 01/29/21 0329 01/30/21 0257 01/31/21 0605 02/01/21 0244  NA 137 135 134* 134* 133* 136 133*  K 4.6 4.2 5.0 4.6 4.7 4.6 5.2*  CL 105 101 102 100 100 103 101  CO2 24 25 25 29 27 27 26   GLUCOSE 120* 88 137* 113* 111* 117* 123*  BUN 21 24* 24* 28* 33* 39* 39*  CREATININE 1.72* 1.50* 1.61* 1.65* 1.84* 1.60* 1.77*  CALCIUM 9.2 9.4 8.9 8.8* 8.5* 8.6* 8.4*  MG 2.1 2.0 2.1 2.4 2.4 2.3 2.3  PHOS 3.6 4.3  --   --   --   --   --    GFR: Estimated Creatinine Clearance: 44 mL/min (A) (by C-G formula based on SCr of 1.77 mg/dL (H)). Liver Function Tests: Recent Labs  Lab 01/26/21 0039  AST 15  ALT 16  ALKPHOS 55  BILITOT 0.7  PROT 5.6*  ALBUMIN 2.9*   No results for input(s): LIPASE,  AMYLASE in the last 168 hours. No results for input(s): AMMONIA in the last 168 hours. Coagulation Profile: No results for input(s): INR, PROTIME in the last 168 hours. Cardiac Enzymes: No results for input(s): CKTOTAL, CKMB, CKMBINDEX, TROPONINI in the last 168 hours.  BNP (last 3 results) No results for input(s): PROBNP in the last 8760 hours. HbA1C: No results for input(s): HGBA1C in the last 72 hours. CBG: No results for input(s): GLUCAP in the last 168 hours. Lipid Profile: No results for input(s): CHOL, HDL, LDLCALC, TRIG, CHOLHDL, LDLDIRECT in the last 72 hours. Thyroid Function Tests: No results for input(s): TSH, T4TOTAL, FREET4, T3FREE, THYROIDAB in the last 72 hours. Anemia Panel: No results for input(s): VITAMINB12, FOLATE, FERRITIN, TIBC, IRON, RETICCTPCT in the last 72 hours. Sepsis Labs:  No results for input(s): PROCALCITON, LATICACIDVEN in the last 168 hours.  Recent Results (from the past 240 hour(s))  SARS Coronavirus 2 by RT PCR (hospital order, performed in [facility] hospital lab) Nasopharyngeal Nasopharyngeal Swab     Status: None   Collection Time: 01/24/21  5:05 PM   Specimen: Nasopharyngeal Swab  Result Value Ref Range Status   SARS Coronavirus 2 NEGATIVE NEGATIVE Final    Comment: (NOTE) SARS-CoV-2 target nucleic acids are NOT DETECTED.  The SARS-CoV-2 RNA is generally detectable in upper and lower respiratory specimens during the acute phase of infection. The lowest concentration of SARS-CoV-2 viral copies this assay can detect is 250 copies / mL. A negative result does not preclude SARS-CoV-2 infection and should not be used as the sole basis for treatment or other patient management decisions.  A negative result may occur with improper specimen collection / handling, submission of specimen other than nasopharyngeal swab, presence of viral mutation(s) within the areas targeted by this assay, and inadequate number of viral copies (<250 copies / mL).  A negative result must be combined with clinical observations, patient history, and epidemiological information.  Fact Sheet for Patients:   StrictlyIdeas.no  Fact Sheet for Healthcare Providers: BankingDealers.co.za  This test is not yet approved or  cleared by the [place] FDA and has been authorized for detection and/or diagnosis of SARS-CoV-2 by FDA under an Emergency Use Authorization (EUA).  This EUA will remain in effect (meaning this test can be used) for the duration of the COVID-19 declaration under Section 564(b)(1) of the Act, 21 U.S.C. section 360bbb-3(b)(1), unless the authorization is terminated or revoked sooner.  Performed at [facility], [place] [address] [place], [place] [address]   Surgical pcr screen     Status: None   Collection Time: 01/27/21  4:00 AM   Specimen: Nasal Mucosa; Nasal Swab  Result Value Ref Range Status   MRSA, PCR NEGATIVE NEGATIVE Final   Staphylococcus aureus NEGATIVE NEGATIVE Final    Comment: (NOTE) The Xpert SA Assay (FDA approved for NASAL specimens in patients 24 years of age and older), is one component of a comprehensive surveillance program. It is not intended to diagnose infection nor to guide or monitor treatment. Performed at [facility], [place] [address] [place], [place] [address]          Radiology Studies: No results found.      Scheduled Meds:  ALPRAZolam  0.5 mg Oral Once   aspirin EC  81 mg Oral Daily   Chlorhexidine Gluconate Cloth  6 each Topical Daily   ezetimibe  10 mg Oral Daily   finasteride  5 mg Oral Daily   gabapentin  300 mg Oral TID   lisinopril  5 mg Oral Daily   multivitamin with minerals  1 tablet Oral Daily   Ensure Max Protein  11 oz Oral QHS   rosuvastatin  5 mg Oral Once per day on Mon Thu   Continuous Infusions:  sodium chloride     sodium chloride irrigation       LOS: 8 days   Time spent= 35 mins    Yiselle Babich  Arsenio Loader, MD [facility]  If 7PM-7AM, please contact night-coverage  02/01/2021, 12:36 PM

## 2021-02-01 NOTE — Progress Notes (Signed)
Patient ID: [name], male   DOB: [birth year], 75 y.o.   MRN: [id number]  5 Days Post-Op Subjective: Pt well known to me admitted over the weekend with RLE compartment syndrome.  He had trauma with initial attempts at urethral catheter placement with subsequent hematuria.  Denies hematuria prior to his hospitalization.  24 Fr hematuria catheter placed by Dr. Alyson Ingles and catheter irrigated.  Brillinta being held.  Objective: Vital signs in last 24 hours: Temp:  [97.8 F (36.6 C)-98.6 F (37 C)] 97.8 F (36.6 C) (10/03 0837) Pulse Rate:  [72-88] 72 (10/03 0837) Resp:  [17-18] 17 (10/03 0837) BP: (98-104)/(52-57) 104/52 (10/03 0837) SpO2:  [95 %-97 %] 97 % (10/03 0837)  Intake/Output from previous day: 10/02 0701 - 10/03 0700 In: 25800  Out: 29240 [Urine:29240] Intake/Output this shift: No intake/output data recorded.  Physical Exam:  GU: Urine dark red in catheter tubing c/w old blood.  I irrigated catheter and removed multiple clots.  CBI restarted with urine light pink.  Lab Results: Recent Labs    01/30/21 0257 01/31/21 0605 02/01/21 0244  HGB 9.7* 9.2* 9.2*  HCT 29.5* 28.0* 28.1*   BMET Recent Labs    01/31/21 0605 02/01/21 0244  NA 136 133*  K 4.6 5.2*  CL 103 101  CO2 27 26  GLUCOSE 117* 123*  BUN 39* 39*  CREATININE 1.60* 1.77*  CALCIUM 8.6* 8.4*     Studies/Results: No results found.  Assessment/Plan: Hematuria related to urethral catheter trauma: Continue to wean CBI as tolerated to keep urine light pink.  Ok to hand irrigate catheter prn if urine becoming red or dark or not draining well.  This should resolve with catheter management but will consider cystoscopy with clot evacuation in OR if not improving over next 24-48 hrs.   LOS: 8 days   [name] 02/01/2021, 11:50 AM

## 2021-02-02 DIAGNOSIS — T148XXA Other injury of unspecified body region, initial encounter: Secondary | ICD-10-CM | POA: Diagnosis not present

## 2021-02-02 DIAGNOSIS — R310 Gross hematuria: Secondary | ICD-10-CM | POA: Diagnosis not present

## 2021-02-02 DIAGNOSIS — T79A21D Traumatic compartment syndrome of right lower extremity, subsequent encounter: Secondary | ICD-10-CM | POA: Diagnosis not present

## 2021-02-02 MED ORDER — SODIUM ZIRCONIUM CYCLOSILICATE 10 G PO PACK
10.0000 g | PACK | Freq: Once | ORAL | Status: AC
  Administered 2021-02-02: 10 g via ORAL
  Filled 2021-02-02: qty 1

## 2021-02-02 MED ORDER — DM-GUAIFENESIN ER 30-600 MG PO TB12
1.0000 | ORAL_TABLET | Freq: Two times a day (BID) | ORAL | Status: AC
  Administered 2021-02-02 (×2): 1 via ORAL
  Filled 2021-02-02 (×2): qty 1

## 2021-02-02 MED ORDER — DM-GUAIFENESIN ER 30-600 MG PO TB12: 1.0000 | ORAL_TABLET | Freq: Two times a day (BID) | ORAL | Status: DC

## 2021-02-02 NOTE — Progress Notes (Signed)
Physical Therapy Treatment Patient Details Name: [name] MRN: [id number] DOB: 1945-11-27 Today's Date: 02/02/2021   History of Present Illness 75 yo male presenting to ED  on 9/25 with Rt calf unjiry after slamming it in a car door. S/p irrigation and debridement of RLE for compartment fasciotomy on 9/28.  Found to have hematuria due to cathter trauma during admission now requiring bladder irrigation. 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    PT Comments    Patient progressing very slowly with mobility. Noted to have worsening debility due to being in bed the last 2 days. Pain seems improved today. Pt now requires Mod A of 2 for bed mobility and standing from low surfaces. Mildly unsteady during short distance ambulation to/from bathroom. Fatigues quickly. LOB sitting EOB posteriorly and standing. Encouraged OOB and walking to bathroom with nursing as able to improve strength/mobility. Continues to be appropriate for SNF. Will follow.    Recommendations for follow up therapy are one component of a multi-disciplinary discharge planning process, led by the attending physician.  Recommendations may be updated based on patient status, additional functional criteria and insurance authorization.  Follow Up Recommendations  SNF     Equipment Recommendations  Rolling walker with 5" wheels    Recommendations for Other Services       Precautions / Restrictions Precautions Precautions: Fall;Other (comment) Precaution Comments: wound vac, bladder irrigation Restrictions Weight Bearing Restrictions: Yes RLE Weight Bearing: Weight bearing as tolerated     Mobility  Bed Mobility Overal bed mobility: Needs Assistance Bed Mobility: Sit to Supine     Supine to sit: Mod assist;HOB elevated Sit to supine: Mod assist;HOB elevated   General bed mobility comments: Assist to elevate trunk and scoot bottom  to EOB, increased time/effort despite using rails. Assist to bring LEs into bed and to reposition.    Transfers Overall transfer level: Needs assistance Equipment used: Rolling walker (2 wheeled) Transfers: Sit to/from Stand Sit to Stand: Mod assist;+2 physical assistance         General transfer comment: Assist of 2 to power to standing with use of momentum and cues for hand placement, posterior bias once upright with LOB x2 initially, stood from EOB x1, from toilet x1.  Ambulation/Gait Ambulation/Gait assistance: Min assist Gait Distance (Feet): 15 Feet (x2 bouts) Assistive device: Rolling walker (2 wheeled) Gait Pattern/deviations: Step-through pattern;Decreased stride length;Trunk flexed;Antalgic Gait velocity: decreased Gait velocity interpretation: <1.31 ft/sec, indicative of household ambulator General Gait Details: Slow, mildly unsteady gait with instability noted throughout, posterior bias initially which improved. Assist with RW management esp with turns. Fatigues.   Stairs             Wheelchair Mobility    Modified Rankin (Stroke Patients Only)       Balance Overall balance assessment: Needs assistance Sitting-balance support: Feet supported;No upper extremity supported Sitting balance-Leahy Scale: Poor Sitting balance - Comments: LOB posteriorly sitting EOB needing UE support on RW to maintain upright   Standing balance support: During functional activity Standing balance-Leahy Scale: Poor Standing balance comment: reliant on UE support and close min guard-Min A for dynamic tasks.                            Cognition Arousal/Alertness: Awake/alert Behavior During Therapy: WFL for tasks assessed/performed Overall Cognitive Status: Within Functional Limits for tasks assessed  General Comments: for basic mobility tasks; cues for safety at home.      Exercises      General Comments General  comments (skin integrity, edema, etc.): Wound vac in place.      Pertinent Vitals/Pain Pain Assessment: Faces Faces Pain Scale: Hurts a little bit Pain Location: RLE Pain Descriptors / Indicators: Constant;Discomfort Pain Intervention(s): Monitored during session;Repositioned    Home Living                      Prior Function            PT Goals (current goals can now be found in the care plan section) Progress towards PT goals: Not progressing toward goals - comment (due to weakness/pain)    Frequency    Min 3X/week      PT Plan Current plan remains appropriate    Co-evaluation              AM-PAC PT "6 Clicks" Mobility   Outcome Measure  Help needed turning from your back to your side while in a flat bed without using bedrails?: A Little Help needed moving from lying on your back to sitting on the side of a flat bed without using bedrails?: A Lot Help needed moving to and from a bed to a chair (including a wheelchair)?: A Lot Help needed standing up from a chair using your arms (e.g., wheelchair or bedside chair)?: A Lot Help needed to walk in hospital room?: A Little Help needed climbing 3-5 steps with a railing? : A Lot 6 Click Score: 14    End of Session Equipment Utilized During Treatment: Gait belt Activity Tolerance: Patient limited by fatigue Patient left: in bed;with call bell/phone within reach;with bed alarm set Nurse Communication: Mobility status PT Visit Diagnosis: Other abnormalities of gait and mobility (R26.89);Pain Pain - Right/Left: Right Pain - part of body: Leg     Time: 4158-3094 PT Time Calculation (min) (ACUTE ONLY): 36 min  Charges:  $Therapeutic Activity: 23-37 mins                     Marisa Severin, PT, DPT Acute Rehabilitation Services Pager [phone] Office [place] 02/02/2021, 11:40 AM

## 2021-02-02 NOTE — Progress Notes (Signed)
PROGRESS NOTE    [name]  SVX:[id number] DOB: [birth year] DOA: 01/24/2021 PCP: [name], MD   Brief Narrative:  75 y.o. 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  In the hospital he was diagnosed with right lower extremity compartment syndrome and underwent emergent fasciotomy.  He is status post OR again on 2/33 for I&D, application of dermal skin graft and wound VAC placement.  Patient was seen by physical therapy who recommended SNF.  While awaiting SNF placement he developed hematuria with urinary retention, Foley catheter has to be placed.  Urology team consulted and patient was started on continuous bladder irrigation.   Assessment & Plan:   Active Problems:   Compartment syndrome (HCC)   Hematoma  Persistent hematuria with acute urinary retention -Urology following, currently on continuous bladder irrigation.  Brilinta is on hold, hemoglobin is stable for now.   Right lower extremity compartment syndrome -Status post emergent fasciotomy on 9/25 with repeat I&D and skin grafting with wound VAC placement on 9/28.  Increase his pain medications today.  Continue bowel regimen.  Will need outpatient follow-up and wound care management per orthopedic.  On aspirin, Brilinta on hold -Gabapentin 300mg  tid  - PT-recommending SNF.  TOC consulted, he does have a bed at [facility] SN[facility] per TOC  Acute blood loss anemia - Baseline hemoglobin 13.0, hemoglobin stable at 9.2 continue to monitor this   Syncope -This is resolved.   CKD IIIa -Creatinine is stable and at baseline 1.5.    CAD with stenting Initially resumed aspirin and Brilinta (Discussed with Cardiology, Dr Percival Spanish, who prefers continuing Brilinta as well unless if he remains high risk).  But now due to hematuria, I will discontinue Brilinta, will  also hold subcu Lovenox   HTN -Lisinopril 5 mg daily, metoprolol    Dyslipidemia -Daily Zetia    DVT prophylaxis: Lovenox held due to hematuria.  Unable to place SCDs due to his surgery Code Status: Full code Family Communication: Says he will update his family  Status is: Inpatient  Remains inpatient appropriate because:Inpatient level of care appropriate due to severity of illness.  O ongoing hematuria with continuous bladder irrigation Dispo: The patient is from: Home              Anticipated d/c is to: SNF              Patient currently Is not medically ready, ongoing hematuria.  Maintain hospital stay   Difficult to place patient No       Nutritional status  Nutrition Problem: Increased nutrient needs Etiology: wound healing, post-op healing  Signs/Symptoms: estimated needs  Interventions: MVI, Refer to RD note for recommendations, Magic cup, Premier Protein  Body mass index is 40.14 kg/m.           Subjective: Still having hematuria with pink urine.  Tells me his right lower extremity feels little better.  No other complaints at this time    Physical Exam Constitutional: Not in acute distress Respiratory: Clear to auscultation bilaterally Cardiovascular: Normal sinus rhythm, no rubs Abdomen: Nontender nondistended good bowel sounds Musculoskeletal: No edema noted Skin: Right lower extremity dressing in place without any evidence of active bleeding Neurologic: CN 2-12 grossly intact.  And nonfocal Psychiatric: Normal judgment and insight. Alert and oriented x 3. Normal mood. Hematuria still  noted in his Foley bag  Objective: Vitals:   02/01/21 1239 02/01/21 2115 02/01/21 2218 02/02/21 0803  BP: 139/64 (!) 102/49  (!) 101/54  Pulse: 93 86  91  Resp: 14 18  19   Temp: 97.7 F (36.5 C) 98.3 F (36.8 C)  97.8 F (36.6 C)  TempSrc: Oral Oral  Oral  SpO2: 97% 94% 95% 96%  Weight:      Height:        Intake/Output Summary (Last 24 hours) at  02/02/2021 1157 Last data filed at 02/02/2021 0725 Gross per 24 hour  Intake 30600 ml  Output 17960 ml  Net 12640 ml   Filed Weights   01/24/21 1229  Weight: 119.7 kg     Data Reviewed:   CBC: Recent Labs  Lab 01/28/21 0154 01/29/21 0329 01/30/21 0257 01/31/21 0605 02/01/21 0244  WBC 13.4* 12.8* 13.5* 12.2* 14.7*  HGB 9.9* 10.3* 9.7* 9.2* 9.2*  HCT 30.4* 32.5* 29.5* 28.0* 28.1*  MCV 88.1 90.0 88.9 88.9 89.8  PLT 246 275 280 272 875   Basic Metabolic Panel: Recent Labs  Lab 01/27/21 0355 01/28/21 0154 01/29/21 0329 01/30/21 0257 01/31/21 0605 02/01/21 0244  NA 135 134* 134* 133* 136 133*  K 4.2 5.0 4.6 4.7 4.6 5.2*  CL 101 102 100 100 103 101  CO2 25 25 29 27 27 26   GLUCOSE 88 137* 113* 111* 117* 123*  BUN 24* 24* 28* 33* 39* 39*  CREATININE 1.50* 1.61* 1.65* 1.84* 1.60* 1.77*  CALCIUM 9.4 8.9 8.8* 8.5* 8.6* 8.4*  MG 2.0 2.1 2.4 2.4 2.3 2.3  PHOS 4.3  --   --   --   --   --    GFR: Estimated Creatinine Clearance: 44 mL/min (A) (by C-G formula based on SCr of 1.77 mg/dL (H)). Liver Function Tests: No results for input(s): AST, ALT, ALKPHOS, BILITOT, PROT, ALBUMIN in the last 168 hours.  No results for input(s): LIPASE, AMYLASE in the last 168 hours. No results for input(s): AMMONIA in the last 168 hours. Coagulation Profile: No results for input(s): INR, PROTIME in the last 168 hours. Cardiac Enzymes: No results for input(s): CKTOTAL, CKMB, CKMBINDEX, TROPONINI in the last 168 hours.  BNP (last 3 results) No results for input(s): PROBNP in the last 8760 hours. HbA1C: No results for input(s): HGBA1C in the last 72 hours. CBG: No results for input(s): GLUCAP in the last 168 hours. Lipid Profile: No results for input(s): CHOL, HDL, LDLCALC, TRIG, CHOLHDL, LDLDIRECT in the last 72 hours. Thyroid Function Tests: No results for input(s): TSH, T4TOTAL, FREET4, T3FREE, THYROIDAB in the last 72 hours. Anemia Panel: No results for input(s): VITAMINB12,  FOLATE, FERRITIN, TIBC, IRON, RETICCTPCT in the last 72 hours. Sepsis Labs: No results for input(s): PROCALCITON, LATICACIDVEN in the last 168 hours.  Recent Results (from the past 240 hour(s))  SARS Coronavirus 2 by RT PCR (hospital order, performed in [facility] hospital lab) Nasopharyngeal Nasopharyngeal Swab     Status: None   Collection Time: 01/24/21  5:05 PM   Specimen: Nasopharyngeal Swab  Result Value Ref Range Status   SARS Coronavirus 2 NEGATIVE NEGATIVE Final    Comment: (NOTE) SARS-CoV-2 target nucleic acids are NOT DETECTED.  The SARS-CoV-2 RNA is generally detectable in upper and lower respiratory specimens during the acute phase of infection. The lowest concentration of SARS-CoV-2 viral copies this assay can detect is 250 copies / mL. A negative result does not preclude SARS-CoV-2 infection and should not be used  as the sole basis for treatment or other patient management decisions.  A negative result may occur with improper specimen collection / handling, submission of specimen other than nasopharyngeal swab, presence of viral mutation(s) within the areas targeted by this assay, and inadequate number of viral copies (<250 copies / mL). A negative result must be combined with clinical observations, patient history, and epidemiological information.  Fact Sheet for Patients:   StrictlyIdeas.no  Fact Sheet for Healthcare Providers: BankingDealers.co.za  This test is not yet approved or  cleared by the [place] FDA and has been authorized for detection and/or diagnosis of SARS-CoV-2 by FDA under an Emergency Use Authorization (EUA).  This EUA will remain in effect (meaning this test can be used) for the duration of the COVID-19 declaration under Section 564(b)(1) of the Act, 21 U.S.C. section 360bbb-3(b)(1), unless the authorization is terminated or revoked sooner.  Performed at [facility], [place] [address] [place], [place] [address]   Surgical pcr screen     Status: None   Collection Time: 01/27/21  4:00 AM   Specimen: Nasal Mucosa; Nasal Swab  Result Value Ref Range Status   MRSA, PCR NEGATIVE NEGATIVE Final   Staphylococcus aureus NEGATIVE NEGATIVE Final    Comment: (NOTE) The Xpert SA Assay (FDA approved for NASAL specimens in patients 68 years of age and older), is one component of a comprehensive surveillance program. It is not intended to diagnose infection nor to guide or monitor treatment. Performed at [facility], [place] [address], [place], [place] [address]          Radiology Studies: No results found.      Scheduled Meds:  ALPRAZolam  0.5 mg Oral Once   aspirin EC  81 mg Oral Daily   Chlorhexidine Gluconate Cloth  6 each Topical Daily   ezetimibe  10 mg Oral Daily   finasteride  5 mg Oral Daily   gabapentin  300 mg Oral TID   lisinopril  5 mg Oral Daily   multivitamin with minerals  1 tablet Oral Daily   Ensure Max Protein  11 oz Oral QHS   rosuvastatin  5 mg Oral Once per day on Mon Thu   Continuous Infusions:  sodium chloride     sodium chloride irrigation       LOS: 9 days   Time spent= 35 mins    [name] Vanderschaaf Arsenio Loader, MD [facility]  If 7PM-7AM, please contact night-coverage  02/02/2021, 11:57 AM

## 2021-02-02 NOTE — Progress Notes (Addendum)
Called to assess patient.  His Foley catheter was not draining on continuous bladder irrigation and he was having extreme pain.  I irrigated the patient's catheter with return of moderate amount of clot.  I irrigated until I could not irrigate any more clot and restarted his continuous bladder irrigation  After irrigation, abdomen was soft, nontender, nondistended.  CBI was draining light red on moderate drip.

## 2021-02-02 NOTE — Progress Notes (Signed)
Pt's urine output for the last 2 hrs is 100 ml. Pt has abdominal pain, foley is leaking. RN performed bladder irrigation with 60 cc. No blood clots; pt is still in pain. MD Gloriann Loan  is notified and at the bedside.

## 2021-02-02 NOTE — TOC Progression Note (Signed)
Transition of Care [facility]) - Initial/Assessment Note    Patient Details  Name: [name] MRN: [id number] Date of Birth: [birth year]  [facility]) CM/SW Contact:    [name], [place] Phone Number: 02/02/2021, 11:39 AM  Clinical Narrative:                 CSW notified that insurance auth for SNF placement was approved.  (Reference ID: [id number], approved 10/4 - 10/6.  Arist Dagout is SICC for follow up.)  CSW called admissions at [facility] and informed them that the attending reported that the patient is not medically ready today, possible d/c tomorrow, and provided insurance auth information.    Pending: continued medical work up   Expected Discharge Plan: [place] Barriers to Discharge: SNF Authorization Denied, SNF Pending bed offer   Patient Goals and CMS Choice Patient states their goals for this hospitalization and ongoing recovery are:: To go to rehab and get better so that he can go home to his wife CMS Medicare.gov Compare Post Acute Care list provided to:: Patient Choice offered to / list presented to : Patient  Expected Discharge Plan and Services Expected Discharge Plan: [place]       Living arrangements for the past 2 months: Single Family Home                                      Prior Living Arrangements/Services Living arrangements for the past 2 months: Single Family Home Lives with:: Adult Children, Spouse Patient language and need for interpreter reviewed:: Yes Do you feel safe going back to the place where you live?: Yes      Need for Family Participation in Patient Care: Yes (Comment) Care giver support system in place?: Yes (comment)   Criminal Activity/Legal Involvement Pertinent to Current Situation/Hospitalization: No - Comment as needed  Activities of Daily Living Home Assistive Devices/Equipment: None ADL Screening (condition at time of admission) Patient's cognitive ability adequate  to safely complete daily activities?: Yes Is the patient deaf or have difficulty hearing?: No Does the patient have difficulty seeing, even when wearing glasses/contacts?: No Does the patient have difficulty concentrating, remembering, or making decisions?: No Patient able to express need for assistance with ADLs?: Yes Does the patient have difficulty dressing or bathing?: No Independently performs ADLs?: Yes (appropriate for developmental age) Does the patient have difficulty walking or climbing stairs?: No Weakness of Legs: None Weakness of Arms/Hands: None  Permission Sought/Granted   Permission granted to share information with : Yes, Verbal Permission Granted     Permission granted to share info w AGENCY: SNF        Emotional Assessment Appearance:: Appears stated age Attitude/Demeanor/Rapport: Engaged Affect (typically observed): Accepting, Pleasant Orientation: : Oriented to  Time, Oriented to Situation, Oriented to Place, Oriented to Self Alcohol / Substance Use: Not Applicable Psych Involvement: No (comment)  Admission diagnosis:  Hematoma [T14.8XXA] Compartment syndrome ([place]) [T79.A0XA] Patient Active Problem List   Diagnosis Date Noted   Hematoma    Compartment syndrome ([place]) 01/24/2021   Myalgia due to statin 03/12/2020   Coronary artery disease due to lipid rich plaque    Non-ST elevation (NSTEMI) myocardial infarction [facility])    Hypertensive emergency    Elevated troponin    Leg edema    Nephrolithiasis 08/04/2019   AKI (acute kidney injury) ([place]) 08/04/2019   Hypokalemia 08/04/2019   infected  Renal calculus, left 02/04/2019   PCP:  [name], MD Pharmacy:   [facility], [place] - [address] [place] [address] [place] [address] Phone: [phone] Fax: [phone]  [place], [place] - [address]  [place] [address] Phone: [phone] Fax: [phone]     Social Determinants of Health (SDOH) Interventions    Readmission Risk Interventions No flowsheet data found.

## 2021-02-02 NOTE — Plan of Care (Signed)
  Problem: Education: Goal: Knowledge of General Education information will improve Description Including pain rating scale, medication(s)/side effects and non-pharmacologic comfort measures Outcome: Progressing   Problem: Health Behavior/Discharge Planning: Goal: Ability to manage health-related needs will improve Outcome: Progressing   

## 2021-02-02 NOTE — Progress Notes (Signed)
Nutrition Follow-up  DOCUMENTATION CODES:   Morbid obesity  INTERVENTION:   -Continue Ensure Max po daily, each supplement provides 150 kcal and 30 grams of protein.   -Continue MVI with minerals daily -Continue double protein portions with meals -Continue Magic cup TID with meals, each supplement provides 290 kcal and 9 grams of protein   NUTRITION DIAGNOSIS:   Increased nutrient needs related to wound healing, post-op healing as evidenced by estimated needs.  Ongoing  GOAL:   Patient will meet greater than or equal to 90% of their needs  Progressing   MONITOR:   PO intake, Supplement acceptance, Diet advancement, Labs, Weight trends, Skin, I & O's  REASON FOR ASSESSMENT:   Consult Assessment of nutrition requirement/status, Hip fracture protocol  ASSESSMENT:   [name] is a 75 y.o. male who presents status post hitting his right leg in a car door at approximately 730 this morning.  He states that he did not initially have pain although developed pain in the ensuing hours.  [facility] and subsequently was evaluated.  CT scan showed a large hematoma in the posterior gastrocs.  He is subsequently consulted me to discuss possible compartment syndrome.  I requested emergent transfer to [facility] for [facility].  I saw the patient immediately upon her arrival and believe the patient has compartment syndrome.  Of note he is on Ticagrelor for his cardiac disease.  Per the EMS he did develop significant blistering of the leg in route.  9/25- s/p PROCEDURE: 1.  Right lower leg 4 compartment fasciotomy 2.  Stryker pressure measurements of right anterior, lateral, superficial posterior, deep posterior compartments 3.  Application of deep wound VAC wound greater than 50 cm 9/28- s/p PROCEDURE: 1.  Irrigation and debridement bone fascia muscle and subcutaneous tissue of anterior lateral wound measuring 20 x 5 cm. 2. Irrigation and  debridement bone fascia muscle and subcutaneous tissue of posteromedial wound measuring 20 x 5 cm. 3. Application of allograft dermal skin matrix 5x5 cm anterolateral wound 4. Application of wound vac area > 50 cm 10/1- 3 way cath placed due to gross hematuria  Reviewed I/O's: +10 L x 24 hours and +6.5 L since admission  Pt unavailable at time of visit. Attempted to speak with pt via call to hospital room phone, however, unable to reach.   Per urology notes, clot irrigated last night. Plan for voiding trial tomorrow if no further bleeding.   Pt with good appetite. Noted meal completion 100%.   Per TOC notes, pt awaiting insurance authorization for SNF  Medications reviewed and include lokelma.   Labs reviewed: Na: 133, K: 5.2.    Diet Order:   Diet Order             Diet Heart Room service appropriate? Yes; Fluid consistency: Thin  Diet effective now                   EDUCATION NEEDS:   No education needs have been identified at this time  Skin:  Skin Assessment: Skin Integrity Issues: Skin Integrity Issues:: Wound VAC Wound Vac: rt leg s/p fasciostomy  Last BM:  02/01/21  Height:   Ht Readings from Last 1 Encounters:  01/24/21 5\' 8"  (1.727 m)    Weight:   Wt Readings from Last 1 Encounters:  01/24/21 119.7 kg    Ideal Body Weight:  67.3 kg  BMI:  Body mass index is 40.14 kg/m.  Estimated Nutritional Needs:  Kcal:  2250-2450  Protein:  125-140 grams  Fluid:  > 2 L    Loistine Chance, RD, LDN, [place] Registered Dietitian II Certified Diabetes Care and Education Specialist Please refer to [facility] for RD and/or RD on-call/weekend/after hours pager

## 2021-02-02 NOTE — Progress Notes (Signed)
Patient ID: Cyril Woodmansee, male   DOB: 04-16-1946, 75 y.o.   MRN: [id number]  6 Days Post-Op Subjective: Pt with one episode of clot retention last night.  Clot irrigated by Dr. Lovena Neighbours with subsequent relief and no other problems throughout the night according to the patient.  Objective: Vital signs in last 24 hours: Temp:  [97.7 F (36.5 C)-98.3 F (36.8 C)] 98.3 F (36.8 C) (10/03 2115) Pulse Rate:  [72-93] 86 (10/03 2115) Resp:  [14-18] 18 (10/03 2115) BP: (102-139)/(49-64) 102/49 (10/03 2115) SpO2:  [94 %-97 %] 95 % (10/03 2218)  Intake/Output from previous day: 10/03 0701 - 10/04 0700 In: 66440 [P.O.:480] Out: 17760 [Urine:17760] Intake/Output this shift: No intake/output data recorded.  Physical Exam:  General: Alert and oriented GU: Urine clear  Lab Results: Recent Labs    01/31/21 0605 02/01/21 0244  HGB 9.2* 9.2*  HCT 28.0* 28.1*   BMET Recent Labs    01/31/21 0605 02/01/21 0244  NA 136 133*  K 4.6 5.2*  CL 103 101  CO2 27 26  GLUCOSE 117* 123*  BUN 39* 39*  CREATININE 1.60* 1.77*  CALCIUM 8.6* 8.4*     Studies/Results: No results found.  Assessment/Plan: Hematuria due to catheter trauma: Urine now clear.  I have turned down CBI and urine still clear.  Likely has not ongoing bleeding considering stability of Hgb.  Hopefully, no further clots in bladder.  Will aggressively attempt to get CBI off today (should be able to if no active bleeding).  If urine still clear off CBI by tomorrow morning, will plan to removed catheter for voiding trial tomorrow.  Will need to then consider when to restart Brillinta but would wait to see what urine looks like after catheter removal.   LOS: 9 days   [name] 02/02/2021, 7:15 AM

## 2021-02-03 ENCOUNTER — Encounter (HOSPITAL_COMMUNITY)
Admission: EM | Disposition: A | Discharge status: Skilled Nursing Facility | Payer: Self-pay | Source: Home / Self Care | Attending: Urology

## 2021-02-03 ENCOUNTER — Encounter (HOSPITAL_COMMUNITY): Payer: Self-pay | Admitting: Internal Medicine

## 2021-02-03 ENCOUNTER — Inpatient Hospital Stay (HOSPITAL_COMMUNITY): Payer: Medicare Other | Admitting: Certified Registered"

## 2021-02-03 DIAGNOSIS — T79A21D Traumatic compartment syndrome of right lower extremity, subsequent encounter: Secondary | ICD-10-CM | POA: Diagnosis not present

## 2021-02-03 HISTORY — PX: CYSTOSCOPY: SHX5120

## 2021-02-03 LAB — CBC
HCT: 26.2 % — ABNORMAL LOW (ref 39.0–52.0)
Hemoglobin: 8.5 g/dL — ABNORMAL LOW (ref 13.0–17.0)
MCH: 29.2 pg (ref 26.0–34.0)
MCHC: 32.4 g/dL (ref 30.0–36.0)
MCV: 90 fL (ref 80.0–100.0)
Platelets: 365 10*3/uL (ref 150–400)
RBC: 2.91 MIL/uL — ABNORMAL LOW (ref 4.22–5.81)
RDW: 15.2 % (ref 11.5–15.5)
WBC: 16.7 10*3/uL — ABNORMAL HIGH (ref 4.0–10.5)
nRBC: 0 % (ref 0.0–0.2)

## 2021-02-03 LAB — BASIC METABOLIC PANEL
Anion gap: 6 (ref 5–15)
BUN: 56 mg/dL — ABNORMAL HIGH (ref 8–23)
CO2: 25 mmol/L (ref 22–32)
Calcium: 8.3 mg/dL — ABNORMAL LOW (ref 8.9–10.3)
Chloride: 101 mmol/L (ref 98–111)
Creatinine, Ser: 1.83 mg/dL — ABNORMAL HIGH (ref 0.61–1.24)
GFR, Estimated: 38 mL/min — ABNORMAL LOW (ref >60)
Glucose, Bld: 140 mg/dL — ABNORMAL HIGH (ref 70–99)
Potassium: 5.4 mmol/L — ABNORMAL HIGH (ref 3.5–5.1)
Sodium: 132 mmol/L — ABNORMAL LOW (ref 135–145)

## 2021-02-03 LAB — MAGNESIUM: Magnesium: 2.6 mg/dL — ABNORMAL HIGH (ref 1.7–2.4)

## 2021-02-03 SURGERY — CYSTOSCOPY
Anesthesia: General

## 2021-02-03 MED ORDER — FENTANYL CITRATE (PF) 250 MCG/5ML IJ SOLN
INTRAMUSCULAR | Status: DC | PRN
  Administered 2021-02-03 (×3): 50 ug via INTRAVENOUS

## 2021-02-03 MED ORDER — STERILE WATER FOR IRRIGATION IR SOLN
Status: DC | PRN
  Administered 2021-02-03: 3000 mL via INTRAVESICAL

## 2021-02-03 MED ORDER — SODIUM ZIRCONIUM CYCLOSILICATE 10 G PO PACK
10.0000 g | PACK | Freq: Once | ORAL | Status: AC
  Administered 2021-02-03: 10 g via ORAL
  Filled 2021-02-03: qty 1

## 2021-02-03 MED ORDER — PHENYLEPHRINE 40 MCG/ML (10ML) SYRINGE FOR IV PUSH (FOR BLOOD PRESSURE SUPPORT)
PREFILLED_SYRINGE | INTRAVENOUS | Status: DC | PRN
  Administered 2021-02-03: 120 ug via INTRAVENOUS
  Administered 2021-02-03: 160 ug via INTRAVENOUS

## 2021-02-03 MED ORDER — DEXAMETHASONE SODIUM PHOSPHATE 10 MG/ML IJ SOLN
INTRAMUSCULAR | Status: DC | PRN
  Administered 2021-02-03: 10 mg via INTRAVENOUS

## 2021-02-03 MED ORDER — EPHEDRINE 5 MG/ML INJ
INTRAVENOUS | Status: AC
  Filled 2021-02-03: qty 20

## 2021-02-03 MED ORDER — DEXAMETHASONE SODIUM PHOSPHATE 10 MG/ML IJ SOLN
INTRAMUSCULAR | Status: AC
  Filled 2021-02-03: qty 3

## 2021-02-03 MED ORDER — FENTANYL CITRATE (PF) 100 MCG/2ML IJ SOLN: 25.0000 ug | INTRAMUSCULAR | Status: DC | PRN

## 2021-02-03 MED ORDER — FENTANYL CITRATE (PF) 250 MCG/5ML IJ SOLN
INTRAMUSCULAR | Status: AC
  Filled 2021-02-03: qty 5

## 2021-02-03 MED ORDER — LIDOCAINE 2% (20 MG/ML) 5 ML SYRINGE
INTRAMUSCULAR | Status: AC
  Filled 2021-02-03: qty 5

## 2021-02-03 MED ORDER — ONDANSETRON HCL 4 MG/2ML IJ SOLN
INTRAMUSCULAR | Status: AC
  Filled 2021-02-03: qty 6

## 2021-02-03 MED ORDER — CHLORHEXIDINE GLUCONATE 0.12 % MT SOLN
15.0000 mL | Freq: Once | OROMUCOSAL | Status: AC
  Administered 2021-02-03: 15 mL via OROMUCOSAL
  Filled 2021-02-03: qty 15

## 2021-02-03 MED ORDER — LIDOCAINE 2% (20 MG/ML) 5 ML SYRINGE
INTRAMUSCULAR | Status: DC | PRN
  Administered 2021-02-03: 60 mg via INTRAVENOUS

## 2021-02-03 MED ORDER — SODIUM CHLORIDE 0.9 % IR SOLN
Status: DC | PRN
  Administered 2021-02-03: 3000 mL via INTRAVESICAL

## 2021-02-03 MED ORDER — PROPOFOL 10 MG/ML IV BOLUS
INTRAVENOUS | Status: AC
  Filled 2021-02-03: qty 20

## 2021-02-03 MED ORDER — CEFAZOLIN SODIUM-DEXTROSE 2-4 GM/100ML-% IV SOLN
INTRAVENOUS | Status: AC
  Filled 2021-02-03: qty 100

## 2021-02-03 MED ORDER — ONDANSETRON HCL 4 MG/2ML IJ SOLN
INTRAMUSCULAR | Status: DC | PRN
  Administered 2021-02-03: 4 mg via INTRAVENOUS

## 2021-02-03 MED ORDER — PHENYLEPHRINE 40 MCG/ML (10ML) SYRINGE FOR IV PUSH (FOR BLOOD PRESSURE SUPPORT)
PREFILLED_SYRINGE | INTRAVENOUS | Status: AC
  Filled 2021-02-03: qty 30

## 2021-02-03 MED ORDER — CEFAZOLIN SODIUM-DEXTROSE 2-3 GM-%(50ML) IV SOLR
INTRAVENOUS | Status: DC | PRN
  Administered 2021-02-03: 2 g via INTRAVENOUS

## 2021-02-03 MED ORDER — LACTATED RINGERS IV SOLN: INTRAVENOUS | Status: DC

## 2021-02-03 MED ORDER — PROPOFOL 10 MG/ML IV BOLUS
INTRAVENOUS | Status: DC | PRN
  Administered 2021-02-03: 150 mg via INTRAVENOUS

## 2021-02-03 MED ORDER — ORAL CARE MOUTH RINSE: 15.0000 mL | Freq: Once | OROMUCOSAL | Status: AC

## 2021-02-03 SURGICAL SUPPLY — 25 items
BAG URINE DRAIN 2000ML AR STRL (UROLOGICAL SUPPLIES) ×2 IMPLANT
BAG URINE DRAINAGE (UROLOGICAL SUPPLIES) ×2 IMPLANT
BAG URO CATCHER STRL LF (MISCELLANEOUS) ×2 IMPLANT
CANISTER PREVENA PLUS 150 (CANNISTER) ×2 IMPLANT
CATH HEMA 3WAY 30CC 22FR COUDE (CATHETERS) IMPLANT
CATH HEMA 3WAY 30CC 24FR COUDE (CATHETERS) ×2 IMPLANT
ELECT REM PT RETURN 9FT ADLT (ELECTROSURGICAL) ×2
ELECTRODE REM PT RTRN 9FT ADLT (ELECTROSURGICAL) ×1 IMPLANT
GLOVE SURG ENC TEXT LTX SZ7.5 (GLOVE) ×2 IMPLANT
GOWN STRL REUS W/ TWL XL LVL3 (GOWN DISPOSABLE) ×1 IMPLANT
GOWN STRL REUS W/TWL XL LVL3 (GOWN DISPOSABLE) ×2
GUIDEWIRE STR DUAL SENSOR (WIRE) ×2 IMPLANT
IV NS IRRIG 3000ML ARTHROMATIC (IV SOLUTION) ×2 IMPLANT
KIT TURNOVER KIT B (KITS) ×4 IMPLANT
LOOP CUT BIPOLAR 24F LRG (ELECTROSURGICAL) IMPLANT
MANIFOLD NEPTUNE II (INSTRUMENTS) ×2 IMPLANT
PACK CYSTO (CUSTOM PROCEDURE TRAY) ×2 IMPLANT
PAD ARMBOARD 7.5X6 YLW CONV (MISCELLANEOUS) ×4 IMPLANT
SYR TOOMEY IRRIG 70ML (MISCELLANEOUS) ×2
SYRINGE IRR TOOMEY STRL 70CC (SYRINGE) ×2 IMPLANT
SYRINGE TOOMEY IRRIG 70ML (MISCELLANEOUS) ×1 IMPLANT
TRAY IRRIG W/60CC SYR STRL (SET/KITS/TRAYS/PACK) IMPLANT
TUBE CONNECTING 12X1/4 (SUCTIONS) ×2 IMPLANT
WATER STERILE IRR 1000ML POUR (IV SOLUTION) ×4 IMPLANT
WATER STERILE IRR 3000ML UROMA (IV SOLUTION) ×2 IMPLANT

## 2021-02-03 NOTE — Op Note (Signed)
Preoperative diagnosis: Hematuria and clot retention  Postoperative diagnosis: Hematuria and clot retention  Procedures: 1.  Cystoscopy 2.  Clot evacuation 3.  Complex catheter placement  Surgeon: Pryor Curia MD  Anesthesia: General  Complications: None  EBL: 25 cc  Specimens: None  Indication: [name] is a 75 year old gentleman with an extremely large prostate.  He recently presented to the hospital with compartment syndrome of his right lower extremity requiring fasciotomy.  A urethral catheter was placed into his prostatic urethra causing Foley catheter trauma.  He subsequently developed developed hematuria which was complicated by the fact that he was on antiplatelet therapy.  He subsequently received a urologic consultation and a hematuria catheter was placed.  He was placed on continuous bladder irrigation and his bladder was periodically irrigated.  He has continued to have significant clot in his bladder that has not been able to be managed with a urethral catheter and presents now for cystoscopy and clot evacuation.  The potential risks, complications, and expected recovery process were discussed in detail.  Informed consent was obtained.  Description of procedure: The patient was taken to the operating room and a general anesthetic was administered.  He was given preoperative antibiotics, placed in the dorsolithotomy position, and prepped and draped in the usual sterile fashion.  Next, a preoperative timeout was performed.  Cystourethroscopy was then performed.  The urethra appeared normal.  Again, he was confirmed to have a very large prostate.  There was noted to be some mild oozing within the prostatic urethra.  Due to poor visualization with the 30 degree lens that was available, it was extremely difficult to navigate into the bladder.  Despite attempts to perform clot evacuation, this was not initially successful and I was not convinced that I was actually in the  bladder.  After further attempts, a large amount of clot was removed.  Additional attempts to visualize the bladder were unsuccessful with the rigid resectoscope.  As such, I then performed flexible cystoscopy and was able to navigate easily into the bladder.  There did not appear to be further significant clot within the bladder.  I placed a 0.38 sensor guidewire through the flexible cystoscope and advanced a 24 [place] three-way hematuria coud catheter over the wire.  This irrigated well and again there were no additional clots noted in the urine appeared relatively clear.  He was restarted on continuous bladder irrigation on a light drip with clear urine noted.  He tolerated the procedure well without complications.  He was able to be awakened and transferred to the recovery unit in satisfactory condition.

## 2021-02-03 NOTE — Anesthesia Procedure Notes (Signed)
Procedure Name: LMA Insertion Date/Time: 02/03/2021 5:31 PM Performed by: Lance Coon, CRNA Pre-anesthesia Checklist: Patient identified, Emergency Drugs available, Suction available, Patient being monitored and Timeout performed Patient Re-evaluated:Patient Re-evaluated prior to induction Oxygen Delivery Method: Circle system utilized Preoxygenation: Pre-oxygenation with 100% oxygen Induction Type: IV induction LMA: LMA inserted LMA Size: 4.0 Number of attempts: 1 Placement Confirmation: positive ETCO2 and breath sounds checked- equal and bilateral Tube secured with: Tape Dental Injury: Teeth and Oropharynx as per pre-operative assessment

## 2021-02-03 NOTE — Anesthesia Postprocedure Evaluation (Signed)
Anesthesia Post Note  Patient: [name]  Procedure(s) Performed: Juliane Poot EVAUATION     Patient location during evaluation: PACU Anesthesia Type: General Level of consciousness: awake Pain management: pain level controlled Vital Signs Assessment: post-procedure vital signs reviewed and stable Respiratory status: spontaneous breathing Cardiovascular status: stable Postop Assessment: no apparent nausea or vomiting Anesthetic complications: no   No notable events documented.  Last Vitals:  Vitals:   02/03/21 1910 02/03/21 1925  BP: 109/64 109/64  Pulse: 77 79  Resp: 18 14  Temp: 36.9 C 36.9 C  SpO2: 96% 100%    Last Pain:  Vitals:   02/03/21 1925  TempSrc:   PainSc: 0-No pain                 Kerry Odonohue

## 2021-02-03 NOTE — Progress Notes (Signed)
Pacu RN Report to floor given  Gave report to RN. 5N06. Discussed surgery, meds given in OR and Pacu, VS, IV fluids given, EBL, urine output, pain and other pertinent information. Also discussed if pt had any family or friends here or belongings with them.   Discussed pt has 3 way foley #24 fr with CBI, emptied 2400 from 3000 ml bag. No pain. RLE wound vac is still on and to continuous 168mm suction.   Pt exits my care.

## 2021-02-03 NOTE — Progress Notes (Signed)
Patient ID: [name], male   DOB: [birth year], 75 y.o.   MRN: [id number]  7 Days Post-Op Subjective: Pt again with an episode of clot retention last night requiring irrigation by Dr. Gloriann Loan.  Urine then clear overnight.  Objective: Vital signs in last 24 hours: Temp:  [97.8 F (36.6 C)] 97.8 F (36.6 C) (10/04 2038) Pulse Rate:  [80-91] 80 (10/04 2038) Resp:  [16-19] 16 (10/04 2038) BP: (101-114)/(53-54) 114/53 (10/04 2038) SpO2:  [94 %-96 %] 94 % (10/04 2038)  Intake/Output from previous day: 10/04 0701 - 10/05 0700 In: 6000  Out: 12105 [Urine:12100; Drains:5] Intake/Output this shift: Total I/O In: 3000 [Other:3000] Out: 7105 [Urine:7100; Drains:5]  Physical Exam:  General: Alert and oriented GU: Urine clear but clot still noted upon irrigation.  Lab Results: Recent Labs    02/01/21 0244 02/03/21 0212  HGB 9.2* 8.5*  HCT 28.1* 26.2*   BMET Recent Labs    02/01/21 0244 02/03/21 0212  NA 133* 132*  K 5.2* 5.4*  CL 101 101  CO2 26 25  GLUCOSE 123* 140*  BUN 39* 56*  CREATININE 1.77* 1.83*  CALCIUM 8.4* 8.3*     Studies/Results: No results found.  Assessment/Plan: Hematuria related to catheter trauma during placement: No active bleeding but still evidence of clot within bladder clearly.  Will plan to proceed to the OR later this afternoon for cystoscopy and clot evacuation.  Hope to be able to leave catheter out after procedure.  If urine then remains clear, can discuss when to consider restarting Brillinta. I discussed the potential benefits and risks of the procedure, side effects of the proposed treatment, the likelihood of the patient achieving the goals of the procedure, and any potential problems that might occur during the procedure or recuperation.  He gives informed consent.    LOS: 10 days   [name] 02/03/2021, 6:48 AM

## 2021-02-03 NOTE — Anesthesia Preprocedure Evaluation (Signed)
Anesthesia Evaluation  Patient identified by MRN, date of birth, ID band Patient awake    Reviewed: Allergy & Precautions, NPO status , Patient's Chart, lab work & pertinent test results  Airway Mallampati: III  TM Distance: >3 FB Neck ROM: Full    Dental  (+) Dental Advisory Given   Pulmonary former smoker,    breath sounds clear to auscultation       Cardiovascular hypertension, Pt. on medications and Pt. on home beta blockers + CAD, + Past MI, + Cardiac Stents and +CHF   Rhythm:Regular Rate:Normal     Neuro/Psych negative neurological ROS     GI/Hepatic negative GI ROS, Neg liver ROS,   Endo/Other  Morbid obesity  Renal/GU CRFRenal disease     Musculoskeletal   Abdominal   Peds  Hematology  (+) anemia ,   Anesthesia Other Findings   Reproductive/Obstetrics                             Lab Results  Component Value Date   WBC 16.7 (H) 02/03/2021   HGB 8.5 (L) 02/03/2021   HCT 26.2 (L) 02/03/2021   MCV 90.0 02/03/2021   PLT 365 02/03/2021   Lab Results  Component Value Date   CREATININE 1.83 (H) 02/03/2021   BUN 56 (H) 02/03/2021   NA 132 (L) 02/03/2021   K 5.4 (H) 02/03/2021   CL 101 02/03/2021   CO2 25 02/03/2021    Anesthesia Physical Anesthesia Plan  ASA: 4  Anesthesia Plan: General   Post-op Pain Management:    Induction: Intravenous  PONV Risk Score and Plan: 2 and Dexamethasone, Ondansetron and Treatment may vary due to age or medical condition  Airway Management Planned: LMA  Additional Equipment: None  Intra-op Plan:   Post-operative Plan: Extubation in OR  Informed Consent: I have reviewed the patients History and Physical, chart, labs and discussed the procedure including the risks, benefits and alternatives for the proposed anesthesia with the patient or authorized representative who has indicated his/her understanding and acceptance.     Dental  advisory given  Plan Discussed with: CRNA  Anesthesia Plan Comments:         Anesthesia Quick Evaluation

## 2021-02-03 NOTE — Progress Notes (Signed)
   Subjective:  Leg continues to feel well. Improving every day. Currently be treated by Urology for urinary clot.   Objective:   VITALS:   Vitals:   02/01/21 2218 02/02/21 0803 02/02/21 2038 02/03/21 0700  BP:  (!) 101/54 (!) 114/53 (!) 113/42  Pulse:  91 80 77  Resp:  19 16 18   Temp:  97.8 F (36.6 C) 97.8 F (36.6 C) 98.4 F (36.9 C)  TempSrc:  Oral Oral Oral  SpO2: 95% 96% 94% 98%  Weight:      Height:        Wound VAC in place over the right wounds with clean dressings.  The wound VAC canister is approximately three quarters full with serosanguineous fluid.  His right foot is warm and well-perfused.  Again hard to palpate DP due to the swelling over the right foot.  All toes have less than 2-second cap refill.  He is able to dorsiflex at the right ankle and extend all of his right toes against resistance.  Full strength in plantar flexion.  Sensation is intact and equal to the contralateral side in all distributions of the right foot   Lab Results  Component Value Date   WBC 16.7 (H) 02/03/2021   HGB 8.5 (L) 02/03/2021   HCT 26.2 (L) 02/03/2021   MCV 90.0 02/03/2021   PLT 365 02/03/2021     Assessment/Plan:  7 Days Post-Op   -Wound vac in place, please maintain. Will perform dressing change at bedside on Oct 7th if remains in hospital -PT consult - DVT ppx - SCD, ambulation, - WBAT operative extremity  [name] 02/03/2021, 8:20 AM

## 2021-02-03 NOTE — Transfer of Care (Signed)
Immediate Anesthesia Transfer of Care Note  Patient: [name]  Procedure(s) Performed: Juliane Poot EVAUATION  Patient Location: PACU  Anesthesia Type:General  Level of Consciousness: drowsy and patient cooperative  Airway & Oxygen Therapy: Patient Spontanous Breathing  Post-op Assessment: Report given to RN and Post -op Vital signs reviewed and stable  Post vital signs: Reviewed and stable  Last Vitals:  Vitals Value Taken Time  BP    Temp    Pulse    Resp 18 02/03/21 1833  SpO2    Vitals shown include unvalidated device data.  Last Pain:  Vitals:   02/03/21 1500  TempSrc: Oral  PainSc:       Patients Stated Pain Goal: 0 (10/23/74 2831)  Complications: No notable events documented.

## 2021-02-03 NOTE — Progress Notes (Signed)
OT Cancellation Note  Patient Details Name: [name] MRN: [id number] DOB: 1945-09-23   Cancelled Treatment:    Reason Eval/Treat Not Completed: Patient at procedure or test/ unavailable  Latrecia Capito C, OT/L  [facility] 02/03/2021, 4:01 PM

## 2021-02-03 NOTE — Progress Notes (Signed)
PROGRESS NOTE  [name] [name]:[id number] DOB: [birth year] DOA: 01/24/2021 PCP: [name], MD  HPI/Recap of past 24 hours:  75 y.o. 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  In the hospital he was diagnosed with right lower extremity compartment syndrome and underwent emergent fasciotomy.  He is status post OR again on 3/23 for I&D, application of dermal skin graft and wound VAC placement.  Patient was seen by physical therapy who recommended SNF.  While awaiting SNF placement he developed hematuria with urinary retention, Foley catheter had to be placed.  He was started on CBI.  Home Brilinta is on hold.  02/03/21:  Seen at his bedside.  Some discomfort in his RLE with wound vac in place.    Assessment/Plan: Active Problems:   Compartment syndrome (HCC)   Hematoma  Gross hematuria with acute urinary retention -Urology following, currently on continuous bladder irrigation.  Brilinta is on hold, hemoglobin is stable for now.   Right lower extremity compartment syndrome -Status post emergent fasciotomy on 9/25 with repeat I&D and skin grafting with wound VAC placement on 9/28.   Pain management in place. Continue bowel regimen.  Will need outpatient follow-up and wound care management per orthopedic.  On aspirin, Brilinta on hold -Gabapentin 300mg  tid  - PT-recommending SNF.  TOC consulted, he does have a bed at [facility] per TOC   AKI on CKD IIIB -Baseline cr. 1.5.  Cr uprising 1.8 Avoid nephrotoxic meds and hypotension. Monitor UO Repeat BMP  Hyperkalemia in the setting of AKI Lokelma 10 mg x 1    Acute blood loss anemia - Baseline hemoglobin 13.0 Hg is downtrending, 8.5 Transfuse Hg less than 8 Monitor H&H   Resolved Syncope -This is resolved.   CAD with stenting No anginal symptoms at  the time of this visit. Initially resumed aspirin and Brilinta (Discussed with Cardiology, Dr Percival Spanish, who prefers continuing Brilinta as well unless if he remains high risk).  But due to hematuria, Brilinta, and subcu Lovenox were discontinued.   HTN, Bps are soft -Lisinopril 5 mg daily, metoprolol  Continue to monitor vITAL SIGNS   Dyslipidemia -Daily Zetia       DVT prophylaxis: Lovenox held due to hematuria.  Unable to place SCDs due to his surgery Code Status: Full code Family Communication: Says he will update his family   Status is: Inpatient   Remains inpatient appropriate because:Inpatient level of care appropriate due to severity of illness.  O ongoing hematuria with continuous bladder irrigation Dispo: The patient is from: Home              Anticipated d/c is to: SNF              Patient currently Is not medically ready, ongoing hematuria.  Maintain hospital stay              Difficult to place patient No             Nutritional status   Nutrition Problem: Increased nutrient needs Etiology: wound healing, post-op healing   Signs/Symptoms: estimated needs   Interventions: MVI, Refer to RD note for recommendations, Magic cup, Premier Protein   Body mass index is 40.14 kg/m.     Objective: Vitals:   02/01/21 2218 02/02/21 0803 02/02/21 2038 02/03/21 0700  BP:  Marland Kitchen)  101/54 (!) 114/53 (!) 113/42  Pulse:  91 80 77  Resp:  19 16 18   Temp:  97.8 F (36.6 C) 97.8 F (36.6 C) 98.4 F (36.9 C)  TempSrc:  Oral Oral Oral  SpO2: 95% 96% 94% 98%  Weight:      Height:        Intake/Output Summary (Last 24 hours) at 02/03/2021 1231 Last data filed at 02/03/2021 1123 Gross per 24 hour  Intake 6000 ml  Output 15005 ml  Net -9005 ml   Filed Weights   01/24/21 1229  Weight: 119.7 kg    Exam:  General: 75 y.o. year-old male Obese in no acute distress.  Alert and oriented x3. Cardiovascular: Regular rate and rhythm with no rubs or gallops.  No thyromegaly  or JVD noted.   Respiratory: Clear to auscultation with no wheezes or rales. Good inspiratory effort. Abdomen: Soft nontender nondistended with normal bowel sounds. Musculoskeletal: Lower extremity edema.  Skin: RLE with wound vac in place. Psychiatry: Mood is appropriate for condition and setting   Data Reviewed: CBC: Recent Labs  Lab 01/29/21 0329 01/30/21 0257 01/31/21 0605 02/01/21 0244 02/03/21 0212  WBC 12.8* 13.5* 12.2* 14.7* 16.7*  HGB 10.3* 9.7* 9.2* 9.2* 8.5*  HCT 32.5* 29.5* 28.0* 28.1* 26.2*  MCV 90.0 88.9 88.9 89.8 90.0  PLT 275 280 272 314 976   Basic Metabolic Panel: Recent Labs  Lab 01/29/21 0329 01/30/21 0257 01/31/21 0605 02/01/21 0244 02/03/21 0212  NA 134* 133* 136 133* 132*  K 4.6 4.7 4.6 5.2* 5.4*  CL 100 100 103 101 101  CO2 29 27 27 26 25   GLUCOSE 113* 111* 117* 123* 140*  BUN 28* 33* 39* 39* 56*  CREATININE 1.65* 1.84* 1.60* 1.77* 1.83*  CALCIUM 8.8* 8.5* 8.6* 8.4* 8.3*  MG 2.4 2.4 2.3 2.3 2.6*   GFR: Estimated Creatinine Clearance: 42.6 mL/min (A) (by C-G formula based on SCr of 1.83 mg/dL (H)). Liver Function Tests: No results for input(s): AST, ALT, ALKPHOS, BILITOT, PROT, ALBUMIN in the last 168 hours. No results for input(s): LIPASE, AMYLASE in the last 168 hours. No results for input(s): AMMONIA in the last 168 hours. Coagulation Profile: No results for input(s): INR, PROTIME in the last 168 hours. Cardiac Enzymes: No results for input(s): CKTOTAL, CKMB, CKMBINDEX, TROPONINI in the last 168 hours. BNP (last 3 results) No results for input(s): PROBNP in the last 8760 hours. HbA1C: No results for input(s): HGBA1C in the last 72 hours. CBG: No results for input(s): GLUCAP in the last 168 hours. Lipid Profile: No results for input(s): CHOL, HDL, LDLCALC, TRIG, CHOLHDL, LDLDIRECT in the last 72 hours. Thyroid Function Tests: No results for input(s): TSH, T4TOTAL, FREET4, T3FREE, THYROIDAB in the last 72 hours. Anemia Panel: No  results for input(s): VITAMINB12, FOLATE, FERRITIN, TIBC, IRON, RETICCTPCT in the last 72 hours. Urine analysis:    Component Value Date/Time   COLORURINE YELLOW 01/26/2021 [place] 01/26/2021 1407   LABSPEC 1.018 01/26/2021 1407   PHURINE 6.0 01/26/2021 1407   GLUCOSEU NEGATIVE 01/26/2021 1407   HGBUR SMALL (A) 01/26/2021 1407   BILIRUBINUR NEGATIVE 01/26/2021 1407   KETONESUR NEGATIVE 01/26/2021 1407   PROTEINUR NEGATIVE 01/26/2021 1407   NITRITE NEGATIVE 01/26/2021 1407   LEUKOCYTESUR NEGATIVE 01/26/2021 1407   Sepsis Labs: @LABRCNTIP (procalcitonin:4,lacticidven:4)  ) Recent Results (from the past 240 hour(s))  SARS Coronavirus 2 by RT PCR (hospital order, performed in [facility] hospital lab) Nasopharyngeal Nasopharyngeal Swab  Status: None   Collection Time: 01/24/21  5:05 PM   Specimen: Nasopharyngeal Swab  Result Value Ref Range Status   SARS Coronavirus 2 NEGATIVE NEGATIVE Final    Comment: (NOTE) SARS-CoV-2 target nucleic acids are NOT DETECTED.  The SARS-CoV-2 RNA is generally detectable in upper and lower respiratory specimens during the acute phase of infection. The lowest concentration of SARS-CoV-2 viral copies this assay can detect is 250 copies / mL. A negative result does not preclude SARS-CoV-2 infection and should not be used as the sole basis for treatment or other patient management decisions.  A negative result may occur with improper specimen collection / handling, submission of specimen other than nasopharyngeal swab, presence of viral mutation(s) within the areas targeted by this assay, and inadequate number of viral copies (<250 copies / mL). A negative result must be combined with clinical observations, patient history, and epidemiological information.  Fact Sheet for Patients:   StrictlyIdeas.no  Fact Sheet for Healthcare Providers: BankingDealers.co.za  This test is not yet  approved or  cleared by the [place] FDA and has been authorized for detection and/or diagnosis of SARS-CoV-2 by FDA under an Emergency Use Authorization (EUA).  This EUA will remain in effect (meaning this test can be used) for the duration of the COVID-19 declaration under Section 564(b)(1) of the Act, 21 U.S.C. section 360bbb-3(b)(1), unless the authorization is terminated or revoked sooner.  Performed at [facility], [place] [address] [place], [place] [address]   Surgical pcr screen     Status: None   Collection Time: 01/27/21  4:00 AM   Specimen: Nasal Mucosa; Nasal Swab  Result Value Ref Range Status   MRSA, PCR NEGATIVE NEGATIVE Final   Staphylococcus aureus NEGATIVE NEGATIVE Final    Comment: (NOTE) The Xpert SA Assay (FDA approved for NASAL specimens in patients 19 years of age and older), is one component of a comprehensive surveillance program. It is not intended to diagnose infection nor to guide or monitor treatment. Performed at [facility], [place] [address], [place], [place] [address]       Studies: No results found.  Scheduled Meds:  ALPRAZolam  0.5 mg Oral Once   aspirin EC  81 mg Oral Daily   Chlorhexidine Gluconate Cloth  6 each Topical Daily   ezetimibe  10 mg Oral Daily   finasteride  5 mg Oral Daily   gabapentin  300 mg Oral TID   lisinopril  5 mg Oral Daily   multivitamin with minerals  1 tablet Oral Daily   Ensure Max Protein  11 oz Oral QHS   rosuvastatin  5 mg Oral Once per day on Mon Thu    Continuous Infusions:  sodium chloride     sodium chloride irrigation       LOS: 10 days     Kayleen Memos, MD [facility] Pager [phone]  If 7PM-7AM, please contact night-coverage [email or web address] Password [facility] 02/03/2021, 12:31 PM

## 2021-02-04 ENCOUNTER — Encounter (HOSPITAL_COMMUNITY): Payer: Self-pay | Admitting: Urology

## 2021-02-04 DIAGNOSIS — T79A21D Traumatic compartment syndrome of right lower extremity, subsequent encounter: Secondary | ICD-10-CM | POA: Diagnosis not present

## 2021-02-04 DIAGNOSIS — R310 Gross hematuria: Secondary | ICD-10-CM | POA: Diagnosis not present

## 2021-02-04 DIAGNOSIS — I2510 Atherosclerotic heart disease of native coronary artery without angina pectoris: Secondary | ICD-10-CM | POA: Diagnosis not present

## 2021-02-04 LAB — CBC
HCT: 26.9 % — ABNORMAL LOW (ref 39.0–52.0)
Hemoglobin: 8.4 g/dL — ABNORMAL LOW (ref 13.0–17.0)
MCH: 28.1 pg (ref 26.0–34.0)
MCHC: 31.2 g/dL (ref 30.0–36.0)
MCV: 90 fL (ref 80.0–100.0)
Platelets: 376 10*3/uL (ref 150–400)
RBC: 2.99 MIL/uL — ABNORMAL LOW (ref 4.22–5.81)
RDW: 14.7 % (ref 11.5–15.5)
WBC: 12.2 10*3/uL — ABNORMAL HIGH (ref 4.0–10.5)
nRBC: 0 % (ref 0.0–0.2)

## 2021-02-04 LAB — BASIC METABOLIC PANEL
Anion gap: 8 (ref 5–15)
BUN: 39 mg/dL — ABNORMAL HIGH (ref 8–23)
CO2: 24 mmol/L (ref 22–32)
Calcium: 8.2 mg/dL — ABNORMAL LOW (ref 8.9–10.3)
Chloride: 103 mmol/L (ref 98–111)
Creatinine, Ser: 1.51 mg/dL — ABNORMAL HIGH (ref 0.61–1.24)
GFR, Estimated: 47 mL/min — ABNORMAL LOW (ref >60)
Glucose, Bld: 162 mg/dL — ABNORMAL HIGH (ref 70–99)
Potassium: 5.6 mmol/L — ABNORMAL HIGH (ref 3.5–5.1)
Sodium: 135 mmol/L (ref 135–145)

## 2021-02-04 LAB — MAGNESIUM: Magnesium: 2.3 mg/dL (ref 1.7–2.4)

## 2021-02-04 MED ORDER — SODIUM CHLORIDE 0.9 % IV SOLN: Freq: Once | INTRAVENOUS | Status: DC

## 2021-02-04 MED ORDER — CALCIUM GLUCONATE-NACL 1-0.675 GM/50ML-% IV SOLN
1.0000 g | Freq: Once | INTRAVENOUS | Status: AC
  Administered 2021-02-04: 1000 mg via INTRAVENOUS
  Filled 2021-02-04: qty 50

## 2021-02-04 MED ORDER — MAGNESIUM SULFATE IN D5W 1-5 GM/100ML-% IV SOLN
1.0000 g | Freq: Once | INTRAVENOUS | Status: AC
  Administered 2021-02-04: 1 g via INTRAVENOUS
  Filled 2021-02-04: qty 100

## 2021-02-04 MED ORDER — CEFAZOLIN SODIUM-DEXTROSE 2-4 GM/100ML-% IV SOLN: 2.0000 g | Freq: Three times a day (TID) | INTRAVENOUS | Status: AC

## 2021-02-04 MED ORDER — SODIUM ZIRCONIUM CYCLOSILICATE 10 G PO PACK
10.0000 g | PACK | Freq: Three times a day (TID) | ORAL | Status: AC
  Administered 2021-02-04 (×3): 10 g via ORAL
  Filled 2021-02-04 (×3): qty 1

## 2021-02-04 NOTE — Progress Notes (Signed)
Patient ID: [name], male   DOB: 1945-08-08, 75 y.o.   MRN: [id number]  1 Day Post-Op Subjective: Pt doing well. S/P cystoscopy and clot evacuation last night.  Objective: Vital signs in last 24 hours: Temp:  [97.6 F (36.4 C)-98.6 F (37 C)] 98 F (36.7 C) (10/06 0026) Pulse Rate:  [75-84] 84 (10/06 0026) Resp:  [14-19] 16 (10/06 0026) BP: (104-158)/(46-84) 130/60 (10/06 0026) SpO2:  [94 %-100 %] 97 % (10/06 0026)  Intake/Output from previous day: 10/05 0701 - 10/06 0700 In: 8520 [P.O.:120] Out: 15400 [Urine:15400] Intake/Output this shift: No intake/output data recorded.  Physical Exam:  General: Alert and oriented GU: Urine mostly clear on minimal CBI now  Lab Results: Recent Labs    02/03/21 0212 02/04/21 0239  HGB 8.5* 8.4*  HCT 26.2* 26.9*   BMET Recent Labs    02/03/21 0212 02/04/21 0239  NA 132* 135  K 5.4* 5.6*  CL 101 103  CO2 25 24  GLUCOSE 140* 162*  BUN 56* 39*  CREATININE 1.83* 1.51*  CALCIUM 8.3* 8.2*     Studies/Results: No results found.  Assessment/Plan: 1) Hematuria due to Foley catheter trauma: Cystoscopy was very difficult last evening due to inadequate equipment and patient's challenging anatomy.  However, most all clot appeared to be removed from the bladder.  I did replace a catheter due to evidence of the prostate still prone to bleed easily.  Will titrate CBI off hopefully today but will likely recommend keeping catheter for a week or so even upon discharge to allow for complete resolution of acute trauma.  Would be best to continue ASA 81 mg and hold Brillinta for even a couple more weeks if ok with Cardiology.  Continue finasteride 5 mg daily upon discharge.   LOS: 11 days   [name] 02/04/2021, 7:19 AM

## 2021-02-04 NOTE — Progress Notes (Signed)
Physical Therapy Treatment Patient Details Name: [name] MRN: [id number] DOB: [birth year] Today's Date: 02/04/2021   History of Present Illness 75 yo male presenting to ED  on 9/25 with Rt calf unjiry after slamming it in a car door. S/p irrigation and debridement of RLE for compartment fasciotomy on 9/28.  Found to have hematuria due to cathter trauma during admission now requiring bladder irrigation. Underwent clot evacuation on 10/5.  Placed on tele on 10/6 due to hyperkalemia. 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    PT Comments    Pt. Demos improved strength with bed mobility and transfers during this tx session and is able to complete activity with only 1 person assist, compared with 2 person assist earlier in week.  Pt. Continues to demo poor standing balance requiring increased time to correct prior to taking steps.  Pt. Able to amb short distance to chair with min A, but gait distance limited compared to prior tx session due to c/o fatigue.  Pt. Would benefit from continued skilled PT in acute care to address dec LE strength, transfer difficulty, dec standing balance and dec activity tolerance.  Current safest D/C plan remains SNF.  Recommendations for follow up therapy are one component of a multi-disciplinary discharge planning process, led by the attending physician.  Recommendations may be updated based on patient status, additional functional criteria and insurance authorization.  Follow Up Recommendations  SNF     Equipment Recommendations  Rolling walker with 5" wheels    Recommendations for Other Services       Precautions / Restrictions Restrictions Weight Bearing Restrictions: Yes RLE Weight Bearing: Weight bearing as tolerated     Mobility  Bed Mobility Overal bed mobility: Needs Assistance Bed Mobility: Supine to Sit     Supine to sit: Mod assist     General bed  mobility comments: Pt. able to negotiate LEs to EOB with independence, req's mod A for HH support/trunk support to sit upright.  Pt. takes inc time and has difficulty with scooting pelvis towards EOB.  Educated on placing UEs behind him to assist with pushing pelvis forward, however, pt. does better with pulling on PTs arm to complete transfer.  Demos good sitting balance once EOB with B feet supported. Patient Response: Cooperative  Transfers Overall transfer level: Needs assistance Equipment used: Rolling walker (2 wheeled) Transfers: Sit to/from [facility] Sit to Stand: Mod assist Stand pivot transfers: Min assist       General transfer comment: Pt. performs sit > stand with mod A and use of RW.  VCs to push up with at least one hand from bed.  Utilizes momentum/rocking technique to assist with power up.  Demos poor balance initially upon standing needing VCs to lift head and take a couple small steps backwards to better position feet under BOS.  Pt. able to correct balance with VCs and maintain standing with min A.  Ambulation/Gait Ambulation/Gait assistance: Min assist Gait Distance (Feet): 2 Feet   Gait Pattern/deviations: Shuffle;Decreased step length - right;Decreased step length - left     General Gait Details: Pt. takes small steps over to chair with min A only.  Encouraged to amb further, however, pt. c/o fatigue at this time from transfer and would like to sit.  Of note, pt. did have abnormal labs this morning that could be contributing to feelings of fatigue/SOB with activity.   Stairs  Wheelchair Mobility    Modified Rankin (Stroke Patients Only)       Balance Overall balance assessment: Needs assistance Sitting-balance support: Feet supported Sitting balance-Leahy Scale: Good       Standing balance-Leahy Scale: Poor                              Cognition Arousal/Alertness: Awake/alert Behavior During Therapy:  WFL for tasks assessed/performed Overall Cognitive Status: Within Functional Limits for tasks assessed                                 General Comments: Pt. agreeable to work with PT.  Wants to get OOB but is worried he will need more help today.      Exercises General Exercises - Lower Extremity Ankle Circles/Pumps: AROM;Both;20 reps Heel Slides: AROM;Left;20 reps (VC/TC to limited hip ER) Hip ABduction/ADduction: AROM;Left;20 reps Straight Leg Raises: AAROM;Left;20 reps    General Comments        Pertinent Vitals/Pain Pain Assessment: 0-10 Pain Score: 5  Pain Location: R LE - states like he feels there is something underneath the ace wrap pressing on the back of his calf.  NSG notified. Pain Descriptors / Indicators: Aching;Discomfort;Tender;Tightness Pain Intervention(s): Limited activity within patient's tolerance;Monitored during session;Repositioned    Home Living                      Prior Function            PT Goals (current goals can now be found in the care plan section) Progress towards PT goals: Progressing toward goals (Pt. concerned that he is doing worse today, but reminded that despite amb dec distance, he was able to transfer with only 1 person assist today compared to 2 person assist earlier in week.)    Frequency           PT Plan Current plan remains appropriate    Co-evaluation              AM-PAC PT "6 Clicks" Mobility   Outcome Measure  Help needed turning from your back to your side while in a flat bed without using bedrails?: A Lot Help needed moving from lying on your back to sitting on the side of a flat bed without using bedrails?: A Lot Help needed moving to and from a bed to a chair (including a wheelchair)?: A Lot Help needed standing up from a chair using your arms (e.g., wheelchair or bedside chair)?: A Lot Help needed to walk in hospital room?: A Little Help needed climbing 3-5 steps with a railing?  : A Lot 6 Click Score: 13    End of Session Equipment Utilized During Treatment: Gait belt Activity Tolerance: Patient tolerated treatment well;Patient limited by fatigue Patient left: in chair;with call bell/phone within reach;with chair alarm set Nurse Communication: Mobility status       Time: 8453-6468 PT Time Calculation (min) (ACUTE ONLY): 33 min  Charges:  $Therapeutic Exercise: 8-22 mins $Therapeutic Activity: 8-22 mins                     Tayona Sarnowski A. Akeelah Seppala, PT, DPT Acute Rehabilitation Services Office: [email or web address] 02/04/2021, 10:43 AM

## 2021-02-04 NOTE — Progress Notes (Signed)
Patient was placed on telemetry due to hyperkalemia. MD [place] Crane paged for orders

## 2021-02-04 NOTE — Progress Notes (Addendum)
PROGRESS NOTE  [name] DVV:[id number] DOB: January 19, 1946 DOA: 01/24/2021 PCP: [name], MD  HPI/Recap of past 24 hours: 75 y.o. 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  In the hospital he was diagnosed with right lower extremity compartment syndrome and underwent emergent fasciotomy.  He is status post OR again on 6/26 for I&D, application of dermal skin graft and wound VAC placement.  Patient was seen by physical therapy who recommended SNF.  While awaiting SNF placement he developed hematuria with urinary retention, possibly due to traumatic Foley catheter placement.  He was seen by urology and started on CBI.  Home Brilinta has been on hold.    02/03/21:  Seen at his bedside.  Some discomfort in his RLE with wound vac in place.    Assessment/Plan: Active Problems:   Compartment syndrome (HCC)   Hematoma  Gross hematuria with acute urinary retention status post cystoscopy and clot evacuation on 02/03/2021 by urology, Dr. [name] Ongoing CBI. Appreciate urology's assistance. Continue to monitor H&H He is currently on aspirin 81 mg daily, home Brilinta is on hold.  Severe three-vessel coronary artery disease status post PCI with stenting in April 2021. Surgery was consulted during that admission but felt PCI along with medical management was best option.   -s/p PCI to pRCA and pLCX with residual disease noted with 99% CTO mLAD.  -continue ASA 81mg  daily, Crestor, he is on 5 mg twice weekly once per day on Monday and Thursday.  Brilinta dose was decreased to 60 mg twice daily by cardiology. Cardiology consulted to assist with management of his coronary artery disease and to advise on antiplatelets in the setting of his hematuria.   Right lower extremity compartment syndrome -Status post emergent fasciotomy on 9/25 with  repeat I&D and skin grafting with wound VAC placement on 9/28.   Continue pain management -Gabapentin 300mg  tid  - PT-recommending SNF.  TOC consulted, he does have a bed at [facility] per [facility]  Resolved AKI on CKD IIIB suspect prerenal in the setting of dehydration -Baseline cr. 1.5.  Creatinine downtrending, down to baseline creatinine 1.5 with GFR 47. Continue to avoid nephrotoxic agents and dehydration, hypotension.  Hyperkalemia in the setting of AKI Potassium 5.6 on 02/04/2021 Lokelma 10 mg 3 times daily x3 doses.  Acute blood loss anemia - Baseline hemoglobin 13.0 Hg is downtrending, 8.5 Transfuse Hg less than 8 Monitor H&H   Resolved Syncope -This is resolved.   HTN, now at goal. Continue lisinopril 5 mg daily. IV metoprolol as needed with parameters.   Dyslipidemia -Daily Zetia and Crestor  Generalized weakness/ambulatory dysfunction PT assessed and recommending SNF TOC with DC planning       DVT prophylaxis: Defer chemical DVT prophylaxis to urology. Code Status: Full code Family Communication: None at bedside   Status is: Inpatient   Remains inpatient appropriate because:Inpatient level of care appropriate due to severity of illness.  O ongoing hematuria with continuous bladder irrigation Dispo: The patient is from: Home              Anticipated d/c is to: SNF              Patient currently Is not medically ready, ongoing hematuria.  Maintain hospital stay              Difficult  to place patient No             Nutritional status   Nutrition Problem: Increased nutrient needs Etiology: wound healing, post-op healing   Signs/Symptoms: estimated needs   Interventions: MVI, Refer to RD note for recommendations, Magic cup, Premier Protein   Body mass index is 40.14 kg/m.     Objective: Vitals:   02/04/21 0026 02/04/21 0821 02/04/21 1131 02/04/21 1404  BP: 130/60 (!) 115/52 (!) 110/54 129/63  Pulse: 84 73 81 79  Resp: 16 15 16 17   Temp:  98 F (36.7 C) 98.8 F (37.1 C) 97.8 F (36.6 C) 98.7 F (37.1 C)  TempSrc: Oral Oral Oral Oral  SpO2: 97% 98% 97% 100%  Weight:      Height:        Intake/Output Summary (Last 24 hours) at 02/04/2021 1419 Last data filed at 02/04/2021 1127 Gross per 24 hour  Intake 8640 ml  Output 9600 ml  Net -960 ml   Filed Weights   01/24/21 1229  Weight: 119.7 kg    Exam:  General: 75 y.o. year-old male obese no acute distress.  He is alert oriented x3. Cardiovascular: Regular rate and rhythm no rubs or gallops.   Respiratory: Clear to auscultation with no wheezes or rales.  Good inspiratory effort.   Abdomen: Soft nontender normal bowel sounds present.   Musculoskeletal: She is a extremity edema bilaterally.   Skin: Right lower extremity wound VAC in place. Psychiatry: Mood is appropriate for condition and setting.   Data Reviewed: CBC: Recent Labs  Lab 01/30/21 0257 01/31/21 0605 02/01/21 0244 02/03/21 0212 02/04/21 0239  WBC 13.5* 12.2* 14.7* 16.7* 12.2*  HGB 9.7* 9.2* 9.2* 8.5* 8.4*  HCT 29.5* 28.0* 28.1* 26.2* 26.9*  MCV 88.9 88.9 89.8 90.0 90.0  PLT 280 272 314 365 269   Basic Metabolic Panel: Recent Labs  Lab 01/30/21 0257 01/31/21 0605 02/01/21 0244 02/03/21 0212 02/04/21 0239  NA 133* 136 133* 132* 135  K 4.7 4.6 5.2* 5.4* 5.6*  CL 100 103 101 101 103  CO2 27 27 26 25 24   GLUCOSE 111* 117* 123* 140* 162*  BUN 33* 39* 39* 56* 39*  CREATININE 1.84* 1.60* 1.77* 1.83* 1.51*  CALCIUM 8.5* 8.6* 8.4* 8.3* 8.2*  MG 2.4 2.3 2.3 2.6* 2.3   GFR: Estimated Creatinine Clearance: 51.6 mL/min (A) (by C-G formula based on SCr of 1.51 mg/dL (H)). Liver Function Tests: No results for input(s): AST, ALT, ALKPHOS, BILITOT, PROT, ALBUMIN in the last 168 hours. No results for input(s): LIPASE, AMYLASE in the last 168 hours. No results for input(s): AMMONIA in the last 168 hours. Coagulation Profile: No results for input(s): INR, PROTIME in the last 168 hours. Cardiac  Enzymes: No results for input(s): CKTOTAL, CKMB, CKMBINDEX, TROPONINI in the last 168 hours. BNP (last 3 results) No results for input(s): PROBNP in the last 8760 hours. HbA1C: No results for input(s): HGBA1C in the last 72 hours. CBG: No results for input(s): GLUCAP in the last 168 hours. Lipid Profile: No results for input(s): CHOL, HDL, LDLCALC, TRIG, CHOLHDL, LDLDIRECT in the last 72 hours. Thyroid Function Tests: No results for input(s): TSH, T4TOTAL, FREET4, T3FREE, THYROIDAB in the last 72 hours. Anemia Panel: No results for input(s): VITAMINB12, FOLATE, FERRITIN, TIBC, IRON, RETICCTPCT in the last 72 hours. Urine analysis:    Component Value Date/Time   COLORURINE YELLOW 01/26/2021 [place] 01/26/2021 1407   LABSPEC 1.018 01/26/2021 1407   PHURINE  6.0 01/26/2021 1407   GLUCOSEU NEGATIVE 01/26/2021 1407   HGBUR SMALL (A) 01/26/2021 1407   BILIRUBINUR NEGATIVE 01/26/2021 1407   [place] 01/26/2021 1407   PROTEINUR NEGATIVE 01/26/2021 1407   NITRITE NEGATIVE 01/26/2021 1407   LEUKOCYTESUR NEGATIVE 01/26/2021 1407   Sepsis Labs: @LABRCNTIP (procalcitonin:4,lacticidven:4)  ) Recent Results (from the past 240 hour(s))  Surgical pcr screen     Status: None   Collection Time: 01/27/21  4:00 AM   Specimen: Nasal Mucosa; Nasal Swab  Result Value Ref Range Status   MRSA, PCR NEGATIVE NEGATIVE Final   Staphylococcus aureus NEGATIVE NEGATIVE Final    Comment: (NOTE) The Xpert SA Assay (FDA approved for NASAL specimens in patients 28 years of age and older), is one component of a comprehensive surveillance program. It is not intended to diagnose infection nor to guide or monitor treatment. Performed at [facility], [place] [address], [place], [place] [address]       Studies: No results found.  Scheduled Meds:  ALPRAZolam  0.5 mg Oral Once   aspirin EC  81 mg Oral Daily   Chlorhexidine Gluconate Cloth  6 each Topical Daily   ezetimibe   10 mg Oral Daily   finasteride  5 mg Oral Daily   gabapentin  300 mg Oral TID   lisinopril  5 mg Oral Daily   multivitamin with minerals  1 tablet Oral Daily   Ensure Max Protein  11 oz Oral QHS   rosuvastatin  5 mg Oral Once per day on Mon Thu   sodium zirconium cyclosilicate  10 g Oral TID    Continuous Infusions:  sodium chloride     sodium chloride irrigation       LOS: 11 days     Kayleen Memos, MD [facility] Pager [phone]  If 7PM-7AM, please contact night-coverage [email or web address] Password [email or web address] 02/04/2021, 2:19 PM

## 2021-02-04 NOTE — Plan of Care (Signed)

## 2021-02-04 NOTE — Consult Note (Signed)
Cardiology Consultation:   Patient ID: [name] MRN: [id number]; DOB: 07-09-1945  Admit date: 01/24/2021 Date of Consult: 02/04/2021  PCP:  [name] Noon, MD   [facility] Providers Cardiologist:  Fransico Him, MD   {    Patient Profile:   [name] is a 75 y.o. male with a hx of CAD, s/p PCI who is being seen 02/04/2021 for the evaluation of traumatic hematuria to make recommendations regarding the stopping of his anti-platelet drugs at the request of Dr. [name].  History of Present Illness:   [name] has a h/o an enlarged prostate s/p TURP. He presented to the hospital with a painful swollen leg and found to have a compartment syndrome and underwent fasciotomy. He required foley catheter insertion which was difficult and resulted in gross hematuria presumably due to trauma. The patient had his brilinta stopped. He thinks that his urine is clearing. He denies chest pain or sob. He denies palpitations. His right leg is still painful.   Past Medical History:  Diagnosis Date   Cancer [facility])    CHF (congestive heart failure) (HCC)    EF 45-50% on 08/2019 echo   Chronic kidney disease    RENAL STONES   Coronary artery disease    3 vessel disease: DES to RCA, DES to LCx 08/2019 and medical management   High frequency hearing loss    History of migraine headaches    Hx of colonic polyps    Hyperlipidemia    Hypertension    Obesity    Pulmonary hypertension ([place])    Seborrheic dermatitis    Vitiligo     Past Surgical History:  Procedure Laterality Date   APPLICATION OF WOUND VAC Right 01/24/2021   Procedure: APPLICATION OF WOUND VAC;  Surgeon: [name], MD;  Location: [facility];  Service: Orthopedics;  Laterality: Right;   COLON SURGERY  01/30/2001   CORONARY STENT INTERVENTION N/A 08/12/2019   Procedure: CORONARY STENT INTERVENTION;  Surgeon: Nelva Bush, MD;  Location: [place] [facility] LAB;  Service: Cardiovascular;  Laterality: N/A;    CYSTOSCOPY N/A 02/03/2021   Procedure: CYSTOSCOPY, CLOT EVAUATION;  Surgeon: Raynelle Bring, MD;  Location: [place];  Service: Urology;  Laterality: N/A;   CYSTOSCOPY/URETEROSCOPY/HOLMIUM LASER/STENT PLACEMENT Left 02/04/2019   Procedure: CYSTOSCOPY Left retrograde and attempted left ureteroscopy;  Surgeon: Raynelle Bring, MD;  Location: [facility];  Service: Urology;  Laterality: Left;  ONLY NEEDS 60 MIN   CYSTOSCOPY/URETEROSCOPY/HOLMIUM LASER/STENT PLACEMENT Left 11/07/2019   Procedure: CYSTOSCOPY/URETEROSCOPY/HOLMIUM LASER/STENT PLACEMENT;  Surgeon: Raynelle Bring, MD;  Location: [facility];  Service: Urology;  Laterality: Left;   FASCIOTOMY Right 01/24/2021   Procedure: FASCIOTOMY RIGHT LEG;  Surgeon: [name], MD;  Location: [place];  Service: Orthopedics;  Laterality: Right;   FRACTURE SURGERY     I & D EXTREMITY Right 01/27/2021   Procedure: IRRIGATION AND DEBRIDEMENT RIGHT LOWER EXTREMITY;  Surgeon: [name], MD;  Location: [place];  Service: Orthopedics;  Laterality: Right;   INTRAVASCULAR ULTRASOUND/IVUS N/A 08/12/2019   Procedure: Intravascular Ultrasound/IVUS;  Surgeon: Nelva Bush, MD;  Location: [place] [facility];  Service: Cardiovascular;  Laterality: N/A;   IR CONVERT LEFT NEPHROSTOMY TO NEPHROURETERAL CATH  09/17/2019   IR NEPHROSTOMY PLACEMENT LEFT  02/05/2019   IR NEPHROSTOMY PLACEMENT LEFT  08/06/2019   LEFT HEART CATH AND CORONARY ANGIOGRAPHY N/A 08/09/2019   Procedure: LEFT HEART CATH AND CORONARY ANGIOGRAPHY;  Surgeon: Jettie Booze, MD;  Location: [place] [facility];  Service: Cardiovascular;  Laterality:  N/A;   SKIN BIOPSY     TONSILLECTOMY AND ADENOIDECTOMY         Inpatient Medications: Scheduled Meds:  ALPRAZolam  0.5 mg Oral Once   aspirin EC  81 mg Oral Daily   Chlorhexidine Gluconate Cloth  6 each Topical Daily   ezetimibe  10 mg Oral Daily   finasteride  5 mg Oral Daily   gabapentin  300 mg Oral TID   lisinopril  5 mg Oral Daily    multivitamin with minerals  1 tablet Oral Daily   Ensure Max Protein  11 oz Oral QHS   rosuvastatin  5 mg Oral Once per day on Mon Thu   Continuous Infusions:  sodium chloride     sodium chloride irrigation     PRN Meds: hydrALAZINE, HYDROmorphone (DILAUDID) injection, hydrOXYzine, ipratropium-albuterol, metoprolol tartrate, ondansetron (ZOFRAN) IV, oxyCODONE, polyethylene glycol, senna-docusate, simethicone, traZODone  Allergies:    Allergies  Allergen Reactions   Codeine     Bad Headache    Social History:   Social History   Socioeconomic History   Marital status: Married    Spouse name: Not on file   Number of children: Not on file   Years of education: Not on file   Highest education level: Not on file  Occupational History   Not on file  Tobacco Use   Smoking status: Former    Packs/day: 1.00    Years: 40.00    Pack years: 40.00    Types: Cigarettes    Quit date: 63    Years since quitting: 25.7   Smokeless tobacco: Never  Vaping Use   Vaping Use: Never used  Substance and Sexual Activity   Alcohol use: Not Currently   Drug use: Never   Sexual activity: Not on file  Other Topics Concern   Not on file  Social History Narrative   Not on file   Social Determinants of Health   Financial Resource Strain: Not on file  Food Insecurity: Not on file  Transportation Needs: Not on file  Physical Activity: Not on file  Stress: Not on file  Social Connections: Not on file  Intimate Partner Violence: Not on file    Family History:   History reviewed. No pertinent family history.   ROS:  Please see the history of present illness.   All other ROS reviewed and negative.     Physical Exam/Data:   Vitals:   02/04/21 0026 02/04/21 0821 02/04/21 1131 02/04/21 1404  BP: 130/60 (!) 115/52 (!) 110/54 129/63  Pulse: 84 73 81 79  Resp: 16 15 16 17   Temp: 98 F (36.7 C) 98.8 F (37.1 C) 97.8 F (36.6 C) 98.7 F (37.1 C)  TempSrc: Oral Oral Oral Oral  SpO2:  97% 98% 97% 100%  Weight:      Height:        Intake/Output Summary (Last 24 hours) at 02/04/2021 1647 Last data filed at 02/04/2021 1500 Gross per 24 hour  Intake 8640 ml  Output 8600 ml  Net 40 ml   Last 3 Weights 01/24/2021 08/07/2020 12/31/2019  Weight (lbs) 264 lb 263 lb 12.8 oz 240 lb  Weight (kg) 119.75 kg 119.659 kg 108.863 kg     Body mass index is 40.14 kg/m.  General:  Well nourished, overweight, well developed, in no acute distress HEENT: normal Neck: 6 cm JVD Vascular: No carotid bruits; Distal pulses 2+ bilaterally Cardiac:  normal S1, S2; RRR; no murmur  Lungs:  clear to  auscultation bilaterally, no wheezing, rhonchi or rales  Abd: soft, nontender, no hepatomegaly  Ext: right leg is bandaged which is clean and dry. Musculoskeletal:  No deformities, BUE and BLE strength normal and equal Skin: warm and dry  Neuro:  CNs 2-12 intact, no focal abnormalities noted Psych:  Normal affect   EKG:  The EKG was personally reviewed and demonstrates:  none Telemetry:  Telemetry was personally reviewed and demonstrates:  nsr  Relevant CV Studies: none  Laboratory Data:  High Sensitivity Troponin:   Recent Labs  Lab 01/24/21 2157  TROPONINIHS 7     Chemistry Recent Labs  Lab 02/01/21 0244 02/03/21 0212 02/04/21 0239  NA 133* 132* 135  K 5.2* 5.4* 5.6*  CL 101 101 103  CO2 26 25 24   GLUCOSE 123* 140* 162*  BUN 39* 56* 39*  CREATININE 1.77* 1.83* 1.51*  CALCIUM 8.4* 8.3* 8.2*  MG 2.3 2.6* 2.3  GFRNONAA 39* 38* 47*  ANIONGAP 6 6 8     No results for input(s): PROT, ALBUMIN, AST, ALT, ALKPHOS, BILITOT in the last 168 hours. Lipids No results for input(s): CHOL, TRIG, HDL, LABVLDL, LDLCALC, CHOLHDL in the last 168 hours.  Hematology Recent Labs  Lab 02/01/21 0244 02/03/21 0212 02/04/21 0239  WBC 14.7* 16.7* 12.2*  RBC 3.13* 2.91* 2.99*  HGB 9.2* 8.5* 8.4*  HCT 28.1* 26.2* 26.9*  MCV 89.8 90.0 90.0  MCH 29.4 29.2 28.1  MCHC 32.7 32.4 31.2  RDW 15.1 15.2  14.7  PLT 314 365 376   Thyroid No results for input(s): TSH, FREET4 in the last 168 hours.  BNPNo results for input(s): BNP, PROBNP in the last 168 hours.  DDimer No results for input(s): DDIMER in the last 168 hours.   Radiology/Studies:  No results found.   Assessment and Plan:   Hematuria - agree with holding Brilinta. I doubt low dose ASA is adding much to his hematuria. I would continue the ASA. If hematuria persists, I'll defer additional eval of hematuria to Dr. [name]. CAD - he is s/p PCI 18 months ago. He has been maintained on Brilinta and ASA. There is very little data suggesting we cannot hold Brilinta. Continue low dose ASA. Compartment syndrome - as per surgical service.   Carleene Overlie Kayia Billinger,MD   Risk Assessment/Risk Scores:    [facility] will sign off.   Medication Recommendations:  hold brilinta until 03/02/21 Other recommendations (labs, testing, etc):  none Follow up as an outpatient:  Dr. Radford Pax  For questions or updates, please contact [place] Please consult [email or web address] for contact info under   Signed, Cristopher Peru, MD  02/04/2021 4:47 PM

## 2021-02-05 ENCOUNTER — Encounter (HOSPITAL_BASED_OUTPATIENT_CLINIC_OR_DEPARTMENT_OTHER): Payer: Medicare Other | Admitting: Orthopaedic Surgery

## 2021-02-05 DIAGNOSIS — I25118 Atherosclerotic heart disease of native coronary artery with other forms of angina pectoris: Secondary | ICD-10-CM

## 2021-02-05 DIAGNOSIS — R319 Hematuria, unspecified: Secondary | ICD-10-CM | POA: Diagnosis not present

## 2021-02-05 DIAGNOSIS — T79A21D Traumatic compartment syndrome of right lower extremity, subsequent encounter: Secondary | ICD-10-CM | POA: Diagnosis not present

## 2021-02-05 LAB — BASIC METABOLIC PANEL
Anion gap: 9 (ref 5–15)
BUN: 42 mg/dL — ABNORMAL HIGH (ref 8–23)
CO2: 28 mmol/L (ref 22–32)
Calcium: 8.5 mg/dL — ABNORMAL LOW (ref 8.9–10.3)
Chloride: 99 mmol/L (ref 98–111)
Creatinine, Ser: 1.92 mg/dL — ABNORMAL HIGH (ref 0.61–1.24)
GFR, Estimated: 35 mL/min — ABNORMAL LOW (ref >60)
Glucose, Bld: 119 mg/dL — ABNORMAL HIGH (ref 70–99)
Potassium: 4.7 mmol/L (ref 3.5–5.1)
Sodium: 136 mmol/L (ref 135–145)

## 2021-02-05 LAB — CBC
HCT: 26.3 % — ABNORMAL LOW (ref 39.0–52.0)
Hemoglobin: 8.3 g/dL — ABNORMAL LOW (ref 13.0–17.0)
MCH: 28.5 pg (ref 26.0–34.0)
MCHC: 31.6 g/dL (ref 30.0–36.0)
MCV: 90.4 fL (ref 80.0–100.0)
Platelets: 413 10*3/uL — ABNORMAL HIGH (ref 150–400)
RBC: 2.91 MIL/uL — ABNORMAL LOW (ref 4.22–5.81)
RDW: 14.7 % (ref 11.5–15.5)
WBC: 13.7 10*3/uL — ABNORMAL HIGH (ref 4.0–10.5)
nRBC: 0.1 % (ref 0.0–0.2)

## 2021-02-05 LAB — MAGNESIUM: Magnesium: 2.2 mg/dL (ref 1.7–2.4)

## 2021-02-05 NOTE — Progress Notes (Signed)
   Subjective:  Leg continues to feel well although there is some pain.   Objective:   VITALS:   Vitals:   02/04/21 1600 02/04/21 2036 02/05/21 0920 02/05/21 1431  BP: 129/63 (!) 120/50 (!) 108/51 130/60  Pulse: 79 79 72 86  Resp: 17 16 16 18   Temp: 98.7 F (37.1 C) 98.3 F (36.8 C) 97.8 F (36.6 C) 97.9 F (36.6 C)  TempSrc: Oral Oral Oral   SpO2: 100% 99% 97% 93%  Weight:      Height:        Right leg medial wounds are well-healing.  Lateral wound skin graft is well healing with good granulation.  There is no surrounding erythema or drainage.  All of his wound blisters have defervesced.  Strongly able to dorsiflex the right ankle and plantarflex of the right ankle.  Sensation is intact in all distributions of the right foot.   Lab Results  Component Value Date   WBC 13.7 (H) 02/05/2021   HGB 8.3 (L) 02/05/2021   HCT 26.3 (L) 02/05/2021   MCV 90.4 02/05/2021   PLT 413 (H) 02/05/2021     Assessment/Plan:  2 Days Post-Op   -Dressing change performed today.  Wound vacs removed.  Wounds remain well appearing.  Plan to transition to a dry dressing.  We will plan for weekly dressing changes in the office - WBAT operative extremity  Arantxa Piercey 02/05/2021, 3:51 PM

## 2021-02-05 NOTE — Progress Notes (Signed)
PROGRESS NOTE  [name] ATF:[id number] DOB: Jan 22, 1946 DOA: 01/24/2021 PCP: [name], MD  HPI/Recap of past 24 hours: 75 y.o. 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  In the hospital he was diagnosed with right lower extremity compartment syndrome and underwent emergent fasciotomy.  He is status post OR again on 2/70 for I&D, application of dermal skin graft and wound VAC placement.  Patient was seen by physical therapy who recommended SNF.  While awaiting SNF placement he developed hematuria with urinary retention, possibly due to traumatic Foley catheter placement.  He was seen by urology and started on CBI.  Home Brilinta has been on hold.    02/05/21: Hematuria appears to be improving.  He has no new complaints today.  Assessment/Plan: Active Problems:   Compartment syndrome (HCC)   Hematoma  Gross hematuria with acute urinary retention status post cystoscopy and clot evacuation on 02/03/2021 by urology, Dr. [name] Ongoing CBI. Appreciate urology's assistance. Continue to monitor H&H He is currently on aspirin 81 mg daily, home Brilinta is on hold.  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 1.5.  Creatinine uprising 1.92 Avoid nephrotoxic agent, dehydration and hypotension. Hold off lisinopril due to AKI. Repeat renal panel in the morning  Leukocytosis, suspect reactive Afebrile nontoxic-appearing Denies having any respiratory or GI symptoms Monitor for now  Severe three-vessel coronary artery disease status post PCI with stenting in April 2021. Surgery was consulted during that admission but felt PCI along with medical management was best option.   -s/p PCI to pRCA and pLCX with residual disease noted with 99% CTO mLAD.  -continue ASA 81mg  daily, Crestor, he  is on 5 mg twice weekly once per day on Monday and Thursday.  Brilinta dose was decreased to 60 mg twice daily by cardiology. Cardiology consulted to assist with management of his coronary artery disease and to advise on antiplatelets in the setting of his hematuria.   Right lower extremity compartment syndrome -Status post emergent fasciotomy on 9/25 with repeat I&D and skin grafting with wound VAC placement on 9/28.   Continue pain management -Gabapentin 300mg  tid  - PT-recommending SNF.  TOC consulted, he does have a bed at [facility] per [facility]  Resolved posttreatment: Hyperkalemia in the setting of AKI Potassium 5.6 on 02/04/2021, received Lokelma Repeat potassium 4.7 on 02/05/2021. Continue to monitor  Acute blood loss anemia - Baseline hemoglobin 13.0 Hg is downtrending, 8.5, 8.3. Transfuse Hg less than 8 Monitor H&H   Resolved Syncope -This is resolved.   HTN, now at goal. Continue lisinopril 5 mg daily. IV metoprolol as needed with parameters.   Dyslipidemia -Daily Zetia and Crestor  Generalized weakness/ambulatory dysfunction PT assessed and recommending SNF TOC with DC planning       DVT prophylaxis: Defer chemical DVT prophylaxis to urology. Code Status: Full code Family Communication: None at bedside   Status is: Inpatient   Remains inpatient appropriate because:Inpatient level of care appropriate due to severity of illness.  O ongoing hematuria with continuous bladder irrigation Dispo: The patient is from: Home              Anticipated d/c is to: SNF              Patient currently Is not medically ready, ongoing hematuria.  Maintain hospital stay              Difficult to place patient No             Nutritional status   Nutrition Problem: Increased nutrient needs Etiology: wound healing, post-op healing   Signs/Symptoms: estimated needs   Interventions: MVI, Refer to RD note for recommendations, Magic cup, Premier Protein   Body mass index is  40.14 kg/m.     Objective: Vitals:   02/04/21 1600 02/04/21 2036 02/05/21 0920 02/05/21 1431  BP: 129/63 (!) 120/50 (!) 108/51 130/60  Pulse: 79 79 72 86  Resp: 17 16 16 18   Temp: 98.7 F (37.1 C) 98.3 F (36.8 C) 97.8 F (36.6 C) 97.9 F (36.6 C)  TempSrc: Oral Oral Oral   SpO2: 100% 99% 97% 93%  Weight:      Height:        Intake/Output Summary (Last 24 hours) at 02/05/2021 1804 Last data filed at 02/05/2021 1300 Gross per 24 hour  Intake 3480 ml  Output 5700 ml  Net -2220 ml   Filed Weights   01/24/21 1229  Weight: 119.7 kg    Exam:  General: 75 y.o. year-old male obese in no acute distress.  He is alert and oriented x3. Cardiovascular: Regular rate and rhythm with no rubs or gallops. Respiratory: Clear to Auscultation with No Wheezes or Rales.  Good inspiratory effort.   Abdomen: Soft nontender normal bowel sounds present. Musculoskeletal: Trace lower extremity edema bilaterally. Skin: Right lower extremity in surgical dressings.   Psychiatry: Mood is appropriate for condition and setting.   Data Reviewed: CBC: Recent Labs  Lab 01/31/21 0605 02/01/21 0244 02/03/21 0212 02/04/21 0239 02/05/21 0156  WBC 12.2* 14.7* 16.7* 12.2* 13.7*  HGB 9.2* 9.2* 8.5* 8.4* 8.3*  HCT 28.0* 28.1* 26.2* 26.9* 26.3*  MCV 88.9 89.8 90.0 90.0 90.4  PLT 272 314 365 376 659*   Basic Metabolic Panel: Recent Labs  Lab 01/31/21 0605 02/01/21 0244 02/03/21 0212 02/04/21 0239 02/05/21 0156  NA 136 133* 132* 135 136  K 4.6 5.2* 5.4* 5.6* 4.7  CL 103 101 101 103 99  CO2 27 26 25 24 28   GLUCOSE 117* 123* 140* 162* 119*  BUN 39* 39* 56* 39* 42*  CREATININE 1.60* 1.77* 1.83* 1.51* 1.92*  CALCIUM 8.6* 8.4* 8.3* 8.2* 8.5*  MG 2.3 2.3 2.6* 2.3 2.2   GFR: Estimated Creatinine Clearance: 40.6 mL/min (A) (by C-G formula based on SCr of 1.92 mg/dL (H)). Liver Function Tests: No results for input(s): AST, ALT, ALKPHOS, BILITOT, PROT, ALBUMIN in the last 168 hours. No results  for input(s): LIPASE, AMYLASE in the last 168 hours. No results for input(s): AMMONIA in the last 168 hours. Coagulation Profile: No results for input(s): INR, PROTIME in the last 168 hours. Cardiac Enzymes: No results for input(s): CKTOTAL, CKMB, CKMBINDEX, TROPONINI in the last 168 hours. BNP (last 3 results) No results for input(s): PROBNP in the last 8760 hours. HbA1C: No results for input(s): HGBA1C in the last 72 hours. CBG: No results for input(s): GLUCAP in the last 168 hours. Lipid Profile: No results for input(s): CHOL, HDL, LDLCALC, TRIG, CHOLHDL, LDLDIRECT in the last 72 hours. Thyroid Function Tests: No results for input(s): TSH, T4TOTAL, FREET4, T3FREE, THYROIDAB in the last 72 hours. Anemia Panel: No results for input(s): VITAMINB12, FOLATE, FERRITIN, TIBC, IRON, RETICCTPCT in the last 72 hours. Urine analysis:    Component Value Date/Time   COLORURINE YELLOW 01/26/2021 1407  APPEARANCEUR CLEAR 01/26/2021 1407   LABSPEC 1.018 01/26/2021 1407   PHURINE 6.0 01/26/2021 1407   GLUCOSEU NEGATIVE 01/26/2021 1407   HGBUR SMALL (A) 01/26/2021 1407   BILIRUBINUR NEGATIVE 01/26/2021 1407   [place] 01/26/2021 1407   PROTEINUR NEGATIVE 01/26/2021 1407   NITRITE NEGATIVE 01/26/2021 1407   LEUKOCYTESUR NEGATIVE 01/26/2021 1407   Sepsis Labs: @LABRCNTIP (procalcitonin:4,lacticidven:4)  ) Recent Results (from the past 240 hour(s))  Surgical pcr screen     Status: None   Collection Time: 01/27/21  4:00 AM   Specimen: Nasal Mucosa; Nasal Swab  Result Value Ref Range Status   MRSA, PCR NEGATIVE NEGATIVE Final   Staphylococcus aureus NEGATIVE NEGATIVE Final    Comment: (NOTE) The Xpert SA Assay (FDA approved for NASAL specimens in patients 68 years of age and older), is one component of a comprehensive surveillance program. It is not intended to diagnose infection nor to guide or monitor treatment. Performed at [facility], [place] [address],  [place], [place] [address]       Studies: No results found.  Scheduled Meds:  ALPRAZolam  0.5 mg Oral Once   aspirin EC  81 mg Oral Daily   Chlorhexidine Gluconate Cloth  6 each Topical Daily   ezetimibe  10 mg Oral Daily   finasteride  5 mg Oral Daily   gabapentin  300 mg Oral TID   lisinopril  5 mg Oral Daily   multivitamin with minerals  1 tablet Oral Daily   Ensure Max Protein  11 oz Oral QHS   rosuvastatin  5 mg Oral Once per day on Mon Thu    Continuous Infusions:  sodium chloride     sodium chloride irrigation       LOS: 12 days     Kayleen Memos, MD [facility] Pager [phone]  If 7PM-7AM, please contact night-coverage [email or web address] Password [email or web address] 02/05/2021, 6:04 PM

## 2021-02-05 NOTE — Progress Notes (Signed)
Patient ID: [name], male   DOB: 04-May-1945, 75 y.o.   MRN: [id number]  2 Days Post-Op Subjective: Pt doing well over last 24 hrs with no further hematuria/clots, etc.    Objective: Vital signs in last 24 hours: Temp:  [97.8 F (36.6 C)-98.8 F (37.1 C)] 98.3 F (36.8 C) (10/06 2036) Pulse Rate:  [73-81] 79 (10/06 2036) Resp:  [15-17] 16 (10/06 2036) BP: (110-129)/(50-63) 120/50 (10/06 2036) SpO2:  [97 %-100 %] 99 % (10/06 2036)  Intake/Output from previous day: 10/06 0701 - 10/07 0700 In: 6360 [P.O.:360] Out: 7800 [Urine:7800] Intake/Output this shift: Total I/O In: -  Out: 2000 [Urine:2000]  Physical Exam:  General: Alert and oriented GU: Urine clear on minimal CBI  Lab Results: Recent Labs    02/03/21 0212 02/04/21 0239 02/05/21 0156  HGB 8.5* 8.4* 8.3*  HCT 26.2* 26.9* 26.3*   BMET Recent Labs    02/04/21 0239 02/05/21 0156  NA 135 136  K 5.6* 4.7  CL 103 99  CO2 24 28  GLUCOSE 162* 119*  BUN 39* 42*  CREATININE 1.51* 1.92*  CALCIUM 8.2* 8.5*     Studies/Results: No results found.  Assessment/Plan: 1) Hematuria due to Foley catheter trauma: Will hold CBI today but will keep connected in case it needs to be restarted.  Will re-evaluate later today and see if CBI can be completely stopped/disconnected.  Recommend keeping catheter for a week or so even upon discharge to allow for complete resolution of acute trauma.  Would be best to continue ASA 81 mg and hold Brillinta for the next couple of weeks.  This appears to be ok with Cardiology per Dr. Tanna Furry assessment.  Continue finasteride 5 mg daily upon discharge.     LOS: 12 days   [name] 02/05/2021, 6:53 AM

## 2021-02-05 NOTE — Progress Notes (Signed)
Progress Note  Patient Name: [name] Date of Encounter: 02/05/2021  [facility] Cardiologist: Fransico Him, MD   Subjective   Doing well today. No cardiac complaints.  Inpatient Medications    Scheduled Meds:  ALPRAZolam  0.5 mg Oral Once   aspirin EC  81 mg Oral Daily   Chlorhexidine Gluconate Cloth  6 each Topical Daily   ezetimibe  10 mg Oral Daily   finasteride  5 mg Oral Daily   gabapentin  300 mg Oral TID   lisinopril  5 mg Oral Daily   multivitamin with minerals  1 tablet Oral Daily   Ensure Max Protein  11 oz Oral QHS   rosuvastatin  5 mg Oral Once per day on Mon Thu   Continuous Infusions:  sodium chloride     sodium chloride irrigation     PRN Meds: hydrALAZINE, HYDROmorphone (DILAUDID) injection, hydrOXYzine, ipratropium-albuterol, metoprolol tartrate, ondansetron (ZOFRAN) IV, oxyCODONE, polyethylene glycol, senna-docusate, simethicone, traZODone   Vital Signs    Vitals:   02/04/21 1404 02/04/21 1600 02/04/21 2036 02/05/21 0920  BP:  129/63 (!) 120/50 (!) 108/51  Pulse: 79 79 79 72  Resp:  17 16 16   Temp:  98.7 F (37.1 C) 98.3 F (36.8 C) 97.8 F (36.6 C)  TempSrc:  Oral Oral Oral  SpO2:  100% 99% 97%  Weight:      Height:        Intake/Output Summary (Last 24 hours) at 02/05/2021 1013 Last data filed at 02/05/2021 0815 Gross per 24 hour  Intake 3480 ml  Output 8550 ml  Net -5070 ml   Last 3 Weights 01/24/2021 08/07/2020 12/31/2019  Weight (lbs) 264 lb 263 lb 12.8 oz 240 lb  Weight (kg) 119.75 kg 119.659 kg 108.863 kg      Telemetry    NSR - Personally Reviewed  ECG    none - Personally Reviewed  Physical Exam   GEN: No acute distress.  obese Neck: No JVD Cardiac: RRR, no murmurs, rubs, or gallops.  Respiratory: Clear to auscultation bilaterally. GI: Soft, nontender, non-distended  MS: No edema; No deformity. RLE with ACE wrap Neuro:  Nonfocal  Psych: Normal affect   Labs    High Sensitivity Troponin:   Recent  Labs  Lab 01/24/21 2157  TROPONINIHS 7     Chemistry Recent Labs  Lab 02/03/21 0212 02/04/21 0239 02/05/21 0156  NA 132* 135 136  K 5.4* 5.6* 4.7  CL 101 103 99  CO2 25 24 28   GLUCOSE 140* 162* 119*  BUN 56* 39* 42*  CREATININE 1.83* 1.51* 1.92*  CALCIUM 8.3* 8.2* 8.5*  MG 2.6* 2.3 2.2  GFRNONAA 38* 47* 35*  ANIONGAP 6 8 9     Lipids No results for input(s): CHOL, TRIG, HDL, LABVLDL, LDLCALC, CHOLHDL in the last 168 hours.  Hematology Recent Labs  Lab 02/03/21 0212 02/04/21 0239 02/05/21 0156  WBC 16.7* 12.2* 13.7*  RBC 2.91* 2.99* 2.91*  HGB 8.5* 8.4* 8.3*  HCT 26.2* 26.9* 26.3*  MCV 90.0 90.0 90.4  MCH 29.2 28.1 28.5  MCHC 32.4 31.2 31.6  RDW 15.2 14.7 14.7  PLT 365 376 413*   Thyroid No results for input(s): TSH, FREET4 in the last 168 hours.  BNPNo results for input(s): BNP, PROBNP in the last 168 hours.  DDimer No results for input(s): DDIMER in the last 168 hours.   Radiology    No results found.  Cardiac Studies   none  Patient Profile  75 y.o. male admitted for TURP. History of CAD with prior stents  Assessment & Plan    Hematuria - agree with holding Brilinta.  I would continue the ASA. He does not need to resume Brilinta since he is > 1 year from stent. CAD - he is s/p PCI 18 months ago.  Compartment syndrome - as per surgical service.  [facility] will sign off.   Medication Recommendations:  per MAR. Discontinue Brilinta Other recommendations (labs, testing, etc):  none Follow up as an outpatient:  as scheduled with Dr Radford Pax  For questions or updates, please contact [facility] Please consult [email or web address] for contact info under        Signed, Saliah Crisp [place], MD  02/05/2021, 10:13 AM

## 2021-02-05 NOTE — Progress Notes (Signed)
Patient ID: [name], male   DOB: May 10, 1945, 75 y.o.   MRN: [id number]  Urine has remained clear all day without irrigation.  Will disconnect CBI.  Continue Foley.  Per cardiology, ok to hold Brillinta indefinitely and continue ASA 81 mg.  Patient is ok to be discharged from a urologic standpoint with his catheter but if he is still here on Monday, I will see him and consider a voiding trial at that time.  Otherwise, I will arrange as an outpatient.

## 2021-02-05 NOTE — Plan of Care (Signed)

## 2021-02-05 NOTE — Progress Notes (Signed)
Occupational Therapy Treatment Patient Details Name: [name] MRN: [id number] DOB: [birth year] Today's Date: 02/05/2021   History of present illness 75 yo male presenting to ED  on 9/25 with Rt calf unjiry after slamming it in a car door. S/p irrigation and debridement of RLE for compartment fasciotomy on 9/28.  Found to have hematuria due to cathter trauma during admission now requiring bladder irrigation. Underwent clot evacuation on 10/5.  Placed on tele on 10/6 due to hyperkalemia. 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   OT comments  Patient with continued progress toward patient focused goals.  Sit to stand is still a struggle, Min A with bed elevated, and up to Mod A to scoot hips to edge of bed.  Once up, he was closer to [facility] with RW.  Patient still requires up to Mod a for lower body ADL.  OT to continue to follow in the acute setting, but given level of assist needed currently, SNF is recommended for post acute rehab.     Recommendations for follow up therapy are one component of a multi-disciplinary discharge planning process, led by the attending physician.  Recommendations may be updated based on patient status, additional functional criteria and insurance authorization.    Follow Up Recommendations  SNF    Equipment Recommendations  3 in 1 bedside commode    Recommendations for Other Services PT consult    Precautions / Restrictions Precautions Precautions: Fall;Other (comment) Precaution Comments: wound vac, bladder irrigation Restrictions Weight Bearing Restrictions: Yes RLE Weight Bearing: Weight bearing as tolerated       Mobility Bed Mobility Overal bed mobility: Needs Assistance Bed Mobility: Supine to Sit     Supine to sit: Mod assist     General bed mobility comments: bring hips forward    Transfers Overall transfer level: Needs assistance Equipment used:  Rolling walker (2 wheeled) Transfers: Sit to/from Stand Sit to Stand: Min assist;From elevated surface              Balance Overall balance assessment: Needs assistance Sitting-balance support: Feet supported Sitting balance-Leahy Scale: Good     Standing balance support: During functional activity Standing balance-Leahy Scale: Poor                             ADL either performed or assessed with clinical judgement   ADL       Grooming: Oral care;Wash/dry face;Min guard;Standing               Lower Body Dressing: Sit to/from stand;Moderate assistance Lower Body Dressing Details (indicate cue type and reason): continued assist for socks. Toilet Transfer: Ambulation;RW;Min Psychiatric nurse Details (indicate cue type and reason): moved RW forward to shift his weight forward.         Functional mobility during ADLs: Min guard;Rolling walker                         Cognition Arousal/Alertness: Awake/alert Behavior During Therapy: WFL for tasks assessed/performed Overall Cognitive Status: Within Functional Limits for tasks assessed  Pertinent Vitals/ Pain       Pain Assessment: Faces Faces Pain Scale: Hurts little more Pain Location: back of his calf Pain Descriptors / Indicators: Tender Pain Intervention(s): Monitored during session                                                          Frequency  Min 2X/week        Progress Toward Goals  OT Goals(current goals can now be found in the care plan section)  Progress towards OT goals: Progressing toward goals  Acute Rehab OT Goals Patient Stated Goal: I'd like to go home, we were supposed to go on a cruise. OT Goal Formulation: With patient Time For Goal Achievement: 02/12/21 Potential to Achieve Goals: Good  Plan Discharge plan remains appropriate     Co-evaluation                 AM-PAC OT "6 Clicks" Daily Activity     Outcome Measure   Help from another person eating meals?: None Help from another person taking care of personal grooming?: A Little Help from another person toileting, which includes using toliet, bedpan, or urinal?: A Little Help from another person bathing (including washing, rinsing, drying)?: A Little Help from another person to put on and taking off regular upper body clothing?: A Little Help from another person to put on and taking off regular lower body clothing?: A Lot 6 Click Score: 18    End of Session Equipment Utilized During Treatment: Rolling walker;Gait belt  OT Visit Diagnosis: Unsteadiness on feet (R26.81);Other abnormalities of gait and mobility (R26.89);Muscle weakness (generalized) (M62.81);Pain Pain - Right/Left: Right Pain - part of body: Leg   Activity Tolerance Patient tolerated treatment well   Patient Left in chair;with call bell/phone within reach;with chair alarm set   Nurse Communication          Time: 4599-7741 OT Time Calculation (min): 25 min  Charges: OT General Charges $OT Visit: 1 Visit OT Treatments $Self Care/Home Management : 23-37 mins  02/05/2021  RP, OTR/L  Acute Rehabilitation Services  Office:  [phone]   Metta Clines 02/05/2021, 11:24 AM

## 2021-02-06 DIAGNOSIS — T79A21D Traumatic compartment syndrome of right lower extremity, subsequent encounter: Secondary | ICD-10-CM | POA: Diagnosis not present

## 2021-02-06 LAB — BASIC METABOLIC PANEL
Anion gap: 4 — ABNORMAL LOW (ref 5–15)
BUN: 36 mg/dL — ABNORMAL HIGH (ref 8–23)
CO2: 29 mmol/L (ref 22–32)
Calcium: 8.5 mg/dL — ABNORMAL LOW (ref 8.9–10.3)
Chloride: 102 mmol/L (ref 98–111)
Creatinine, Ser: 1.68 mg/dL — ABNORMAL HIGH (ref 0.61–1.24)
GFR, Estimated: 42 mL/min — ABNORMAL LOW (ref >60)
Glucose, Bld: 120 mg/dL — ABNORMAL HIGH (ref 70–99)
Potassium: 5 mmol/L (ref 3.5–5.1)
Sodium: 135 mmol/L (ref 135–145)

## 2021-02-06 LAB — CBC
HCT: 25.8 % — ABNORMAL LOW (ref 39.0–52.0)
Hemoglobin: 8.1 g/dL — ABNORMAL LOW (ref 13.0–17.0)
MCH: 28.4 pg (ref 26.0–34.0)
MCHC: 31.4 g/dL (ref 30.0–36.0)
MCV: 90.5 fL (ref 80.0–100.0)
Platelets: 429 10*3/uL — ABNORMAL HIGH (ref 150–400)
RBC: 2.85 MIL/uL — ABNORMAL LOW (ref 4.22–5.81)
RDW: 14.7 % (ref 11.5–15.5)
WBC: 13.7 10*3/uL — ABNORMAL HIGH (ref 4.0–10.5)
nRBC: 0.2 % (ref 0.0–0.2)

## 2021-02-06 LAB — MAGNESIUM: Magnesium: 2.1 mg/dL (ref 1.7–2.4)

## 2021-02-06 MED ORDER — BISACODYL 10 MG RE SUPP
10.0000 mg | Freq: Once | RECTAL | Status: AC
  Administered 2021-02-06: 10 mg via RECTAL
  Filled 2021-02-06: qty 1

## 2021-02-06 MED ORDER — BISACODYL 10 MG RE SUPP: 10.0000 mg | Freq: Once | RECTAL | Status: DC

## 2021-02-06 MED ORDER — SENNOSIDES-DOCUSATE SODIUM 8.6-50 MG PO TABS
2.0000 | ORAL_TABLET | Freq: Every day | ORAL | Status: DC
  Administered 2021-02-06 – 2021-02-10 (×5): 2 via ORAL
  Filled 2021-02-06 (×6): qty 2

## 2021-02-06 NOTE — Plan of Care (Signed)

## 2021-02-06 NOTE — Plan of Care (Signed)

## 2021-02-06 NOTE — Progress Notes (Signed)
PROGRESS NOTE  Binyomin Brann KZL:[id number] DOB: [birth year] DOA: 01/24/2021 PCP: [name], MD  HPI/Recap of past 24 hours: 75 y.o. male with medical history significant of NSTEMI, CAD status post PCI with stenting 08/2019(on ASA and Brilinta), HTN, HLD, CKD3B, hearing loss, morbid obesity, came with injury on right calf from a car door.  He was found to have right lower extremity compartment syndrome and underwent emergent fasciotomy.  He is status post OR again on 3/90/30 for I&D, application of dermal skin graft and wound VAC placement.  Patient was seen by physical therapy who recommended SNF.  While awaiting SNF placement he developed hematuria with urinary retention, possibly due to traumatic Foley catheter placement.  He was seen by urology and started on CBI.  Home Brilinta has been held.  Seen by cardiology, okay to continue to hold off Brilinta for now.  Patient is more than a year out of PCI with stent placement.  02/06/21: Seen and examined at his bedside.  He has no new complaints.  His gross hematuria is improving.   Assessment/Plan: Active Problems:   Compartment syndrome (HCC)   Hematoma  Gross hematuria with acute urinary retention status post cystoscopy and clot evacuation on 02/03/2021 by urology, Dr. [name] Ongoing CBI. Continue to monitor H&H He is currently on aspirin 81 mg daily, home Brilinta is on hold.  Okay to continue to hold off Brilinta per Cardiology.  Improving AKI on CKD IIIB suspect prerenal in the setting of dehydration -Baseline cr. 1.5.  Creatinine downtrending 1.68 from 1.92 Continue to avoid nephrotoxic agent, dehydration and hypotension. Hold off lisinopril due to AKI. Repeat renal panel in the morning  Leukocytosis, suspect reactive WBC plateaued 13.7 from 13.7. Afebrile nontoxic-appearing Denies having any respiratory or GI symptoms Monitor for now  Severe three-vessel coronary artery disease status post PCI with stenting in April  2021. Surgery was consulted during that admission but felt PCI along with medical management was best option.   -s/p PCI to pRCA and pLCX with residual disease noted with 99% CTO mLAD.  -continue ASA 81mg  daily, Crestor, he is on 5 mg twice weekly once per day on Monday and Thursday.  Brilinta dose was decreased to 60 mg twice daily by cardiology. Cardiology consulted to assist with management of his coronary artery disease and to advise on antiplatelets in the setting of his hematuria.  Okay to continue to hold off on Brilinta for now per cardiology.   Right lower extremity compartment syndrome -Status post emergent fasciotomy on 9/25 with repeat I&D and skin grafting with wound VAC placement on 9/28.   Continue pain management -Gabapentin 300mg  tid  - PT-recommending SNF.  TOC consulted, he does have a bed at [facility] SN[facility]  Resolved posttreatment: Hyperkalemia in the setting of AKI Potassium 5.6 on 02/04/2021, received Lokelma Serum potassium 5.0 Continue to monitor  Acute blood loss anemia - Baseline hemoglobin 13.0 Hg is downtrending, 8.1 from 8.5, 8.3. Transfuse Hg less than 8 Monitor H&H   Resolved Syncope -This is resolved.   HTN, now at goal. Continue lisinopril 5 mg daily. IV metoprolol as needed with parameters.   Dyslipidemia -Daily Zetia and Crestor  Generalized weakness/ambulatory dysfunction PT assessed and recommending SNF TOC with DC planning Continue PT OT with assistance and fall precautions       DVT prophylaxis: Defer chemical DVT prophylaxis to urology. Code Status: Full code Family Communication: None at bedside   Status is: Inpatient   Remains inpatient appropriate  because:Inpatient level of care appropriate due to severity of illness.  O ongoing hematuria with continuous bladder irrigation Dispo: The patient is from: Home              Anticipated d/c is to: SNF              Patient currently Is not medically ready, ongoing hematuria.   Maintain hospital stay              Difficult to place patient No             Nutritional status   Nutrition Problem: Increased nutrient needs Etiology: wound healing, post-op healing   Signs/Symptoms: estimated needs   Interventions: MVI, Refer to RD note for recommendations, Magic cup, Premier Protein   Body mass index is 40.14 kg/m.     Objective: Vitals:   02/05/21 1431 02/05/21 2049 02/06/21 0817 02/06/21 1446  BP: 130/60 (!) 104/51 129/70 (!) 142/67  Pulse: 86 80 73 74  Resp: 18 16 14 19   Temp: 97.9 F (36.6 C) 99.2 F (37.3 C) 98.6 F (37 C) 98.1 F (36.7 C)  TempSrc:  Oral Oral Oral  SpO2: 93% 96% 99% 100%  Weight:      Height:        Intake/Output Summary (Last 24 hours) at 02/06/2021 1505 Last data filed at 02/06/2021 0800 Gross per 24 hour  Intake 120 ml  Output 900 ml  Net -780 ml   Filed Weights   01/24/21 1229  Weight: 119.7 kg    Exam:  General: 75 y.o. year-old male obese in no acute distress.  He is alert oriented x3.   Cardiovascular: Regular rate and rhythm no rubs or gallops. Respiratory: Clear to auscultation with no wheezes or rales.  Abdomen: Soft nontender normal bowel sounds present.  Musculoskeletal: Trace lower extremity edema bilaterally.   Skin: Right lower extremity in surgical dressing.   Psychiatry: Mood is appropriate for condition and setting.   Data Reviewed: CBC: Recent Labs  Lab 02/01/21 0244 02/03/21 0212 02/04/21 0239 02/05/21 0156 02/06/21 0108  WBC 14.7* 16.7* 12.2* 13.7* 13.7*  HGB 9.2* 8.5* 8.4* 8.3* 8.1*  HCT 28.1* 26.2* 26.9* 26.3* 25.8*  MCV 89.8 90.0 90.0 90.4 90.5  PLT 314 365 376 413* 470*   Basic Metabolic Panel: Recent Labs  Lab 02/01/21 0244 02/03/21 0212 02/04/21 0239 02/05/21 0156 02/06/21 0108  NA 133* 132* 135 136 135  K 5.2* 5.4* 5.6* 4.7 5.0  CL 101 101 103 99 102  CO2 26 25 24 28 29   GLUCOSE 123* 140* 162* 119* 120*  BUN 39* 56* 39* 42* 36*  CREATININE 1.77* 1.83* 1.51*  1.92* 1.68*  CALCIUM 8.4* 8.3* 8.2* 8.5* 8.5*  MG 2.3 2.6* 2.3 2.2 2.1   GFR: Estimated Creatinine Clearance: 46.4 mL/min (A) (by C-G formula based on SCr of 1.68 mg/dL (H)). Liver Function Tests: No results for input(s): AST, ALT, ALKPHOS, BILITOT, PROT, ALBUMIN in the last 168 hours. No results for input(s): LIPASE, AMYLASE in the last 168 hours. No results for input(s): AMMONIA in the last 168 hours. Coagulation Profile: No results for input(s): INR, PROTIME in the last 168 hours. Cardiac Enzymes: No results for input(s): CKTOTAL, CKMB, CKMBINDEX, TROPONINI in the last 168 hours. BNP (last 3 results) No results for input(s): PROBNP in the last 8760 hours. HbA1C: No results for input(s): HGBA1C in the last 72 hours. CBG: No results for input(s): GLUCAP in the last 168 hours. Lipid Profile: No results  for input(s): CHOL, HDL, LDLCALC, TRIG, CHOLHDL, LDLDIRECT in the last 72 hours. Thyroid Function Tests: No results for input(s): TSH, T4TOTAL, FREET4, T3FREE, THYROIDAB in the last 72 hours. Anemia Panel: No results for input(s): VITAMINB12, FOLATE, FERRITIN, TIBC, IRON, RETICCTPCT in the last 72 hours. Urine analysis:    Component Value Date/Time   COLORURINE YELLOW 01/26/2021 [place] 01/26/2021 1407   LABSPEC 1.018 01/26/2021 1407   PHURINE 6.0 01/26/2021 1407   GLUCOSEU NEGATIVE 01/26/2021 1407   HGBUR SMALL (A) 01/26/2021 1407   BILIRUBINUR NEGATIVE 01/26/2021 1407   KETONESUR NEGATIVE 01/26/2021 1407   PROTEINUR NEGATIVE 01/26/2021 1407   NITRITE NEGATIVE 01/26/2021 1407   LEUKOCYTESUR NEGATIVE 01/26/2021 1407   Sepsis Labs: @LABRCNTIP (procalcitonin:4,lacticidven:4)  ) No results found for this or any previous visit (from the past 240 hour(s)).     Studies: No results found.  Scheduled Meds:  ALPRAZolam  0.5 mg Oral Once   aspirin EC  81 mg Oral Daily   bisacodyl  10 mg Rectal Once   Chlorhexidine Gluconate Cloth  6 each Topical Daily    ezetimibe  10 mg Oral Daily   finasteride  5 mg Oral Daily   gabapentin  300 mg Oral TID   multivitamin with minerals  1 tablet Oral Daily   Ensure Max Protein  11 oz Oral QHS   rosuvastatin  5 mg Oral Once per day on Mon Thu   senna-docusate  2 tablet Oral Q0600    Continuous Infusions:  sodium chloride     sodium chloride irrigation       LOS: 13 days     Kayleen Memos, MD [facility] Pager [phone]  If 7PM-7AM, please contact night-coverage [email or web address] Password [email or web address] 02/06/2021, 3:05 PM

## 2021-02-07 DIAGNOSIS — T79A21D Traumatic compartment syndrome of right lower extremity, subsequent encounter: Secondary | ICD-10-CM | POA: Diagnosis not present

## 2021-02-07 LAB — CBC
HCT: 25.4 % — ABNORMAL LOW (ref 39.0–52.0)
Hemoglobin: 8.1 g/dL — ABNORMAL LOW (ref 13.0–17.0)
MCH: 28.4 pg (ref 26.0–34.0)
MCHC: 31.9 g/dL (ref 30.0–36.0)
MCV: 89.1 fL (ref 80.0–100.0)
Platelets: 410 10*3/uL — ABNORMAL HIGH (ref 150–400)
RBC: 2.85 MIL/uL — ABNORMAL LOW (ref 4.22–5.81)
RDW: 14.9 % (ref 11.5–15.5)
WBC: 14.2 10*3/uL — ABNORMAL HIGH (ref 4.0–10.5)
nRBC: 0.4 % — ABNORMAL HIGH (ref 0.0–0.2)

## 2021-02-07 LAB — IRON AND TIBC
Iron: 41 ug/dL — ABNORMAL LOW (ref 45–182)
Saturation Ratios: 13 % — ABNORMAL LOW (ref 17.9–39.5)
TIBC: 312 ug/dL (ref 250–450)
UIBC: 271 ug/dL

## 2021-02-07 LAB — BASIC METABOLIC PANEL
Anion gap: 6 (ref 5–15)
BUN: 32 mg/dL — ABNORMAL HIGH (ref 8–23)
CO2: 27 mmol/L (ref 22–32)
Calcium: 8.4 mg/dL — ABNORMAL LOW (ref 8.9–10.3)
Chloride: 101 mmol/L (ref 98–111)
Creatinine, Ser: 1.61 mg/dL — ABNORMAL HIGH (ref 0.61–1.24)
GFR, Estimated: 44 mL/min — ABNORMAL LOW (ref >60)
Glucose, Bld: 112 mg/dL — ABNORMAL HIGH (ref 70–99)
Potassium: 4.7 mmol/L (ref 3.5–5.1)
Sodium: 134 mmol/L — ABNORMAL LOW (ref 135–145)

## 2021-02-07 LAB — RETICULOCYTES
Immature Retic Fract: 36.8 % — ABNORMAL HIGH (ref 2.3–15.9)
RBC.: 2.87 MIL/uL — ABNORMAL LOW (ref 4.22–5.81)
Retic Count, Absolute: 153.3 10*3/uL (ref 19.0–186.0)
Retic Ct Pct: 5.3 % — ABNORMAL HIGH (ref 0.4–3.1)

## 2021-02-07 LAB — MAGNESIUM: Magnesium: 2.1 mg/dL (ref 1.7–2.4)

## 2021-02-07 LAB — FERRITIN: Ferritin: 68 ng/mL (ref 24–336)

## 2021-02-07 LAB — VITAMIN B12: Vitamin B-12: 281 pg/mL (ref 180–914)

## 2021-02-07 MED ORDER — HYDROCORTISONE (PERIANAL) 2.5 % EX CREA
1.0000 "application " | TOPICAL_CREAM | Freq: Two times a day (BID) | CUTANEOUS | Status: DC | PRN
  Filled 2021-02-07: qty 28.35

## 2021-02-07 NOTE — Progress Notes (Addendum)
   Subjective: 4 Days Post-Op Procedure(s) (LRB): CYSTOSCOPY, CLOT EVAUATION (N/A) Patient reports pain as mild.    Objective: Vital signs in last 24 hours: Temp:  [98.1 F (36.7 C)-99.1 F (37.3 C)] 98.5 F (36.9 C) (10/09 0833) Pulse Rate:  [74-79] 79 (10/09 0833) Resp:  [16-19] 16 (10/09 0833) BP: (120-142)/(47-67) 120/55 (10/09 0833) SpO2:  [96 %-100 %] 100 % (10/09 0833)  Intake/Output from previous day: 10/08 0701 - 10/09 0700 In: 720 [P.O.:720] Out: 3400 [Urine:3400] Intake/Output this shift: Total I/O In: -  Out: 800 [Urine:800]  Recent Labs    02/05/21 0156 02/06/21 0108 02/07/21 0311  HGB 8.3* 8.1* 8.1*   Recent Labs    02/06/21 0108 02/07/21 0311  WBC 13.7* 14.2*  RBC 2.85* 2.85*  HCT 25.8* 25.4*  PLT 429* 410*   Recent Labs    02/06/21 0108 02/07/21 0311  NA 135 134*  K 5.0 4.7  CL 102 101  CO2 29 27  BUN 36* 32*  CREATININE 1.68* 1.61*  GLUCOSE 120* 112*  CALCIUM 8.5* 8.4*   No results for input(s): LABPT, INR in the last 72 hours.  Slight lateral drainage on Kerlex. Moving toes well No results found.  Assessment/Plan: 4 Days Post-Op Procedure(s) (LRB): CYSTOSCOPY, CLOT EVAUATION (N/A) Plan:  continue treatment plan. WBAT  [name] 02/07/2021, 9:13 AM

## 2021-02-07 NOTE — Progress Notes (Signed)
PROGRESS NOTE  [name] WYO:[id number] DOB: 11-13-1945 DOA: 01/24/2021 PCP: [name], MD  HPI/Recap of past 24 hours: 75 y.o. male with medical history significant of NSTEMI, CAD status post PCI with stenting 08/2019(on ASA and Brilinta), HTN, HLD, CKD3B, hearing loss, morbid obesity, came with injury on right calf from a car door.  He was found to have right lower extremity compartment syndrome and underwent emergent fasciotomy.  He is status post OR again on 7/74/12 for I&D, application of dermal skin graft and wound VAC placement.  Patient was seen by physical therapy who recommended SNF.  While awaiting SNF placement he developed hematuria with urinary retention, possibly due to traumatic Foley catheter placement.  He was seen by urology and started on CBI.  Home Brilinta has been held.  Seen by cardiology, okay to continue to hold off Brilinta for now.  Patient is more than a year out of PCI with stent placement.  02/07/21: Seen and examined at his bedside.  He reports pain with defecation.  Preparation H ordered.  Bedside RN reports blood around the stool.  Iron studies ordered.  Assessment/Plan: Active Problems:   Compartment syndrome (HCC)   Hematoma  Gross hematuria, improving, with acute urinary retention status post cystoscopy and clot evacuation on 02/03/2021 by urology, Dr. [name] Ongoing CBI. Continue to monitor H&H He is currently on aspirin 81 mg daily, home Brilinta is on hold.  Okay to continue to hold off Brilinta per Cardiology.  Improving, AKI on CKD IIIB suspect prerenal in the setting of dehydration -Baseline cr. 1.5.  Creatinine downtrending 1.68 from 1.92 Continue to avoid nephrotoxic agent, dehydration and hypotension. Hold off lisinopril due to AKI. Repeat renal panel in the morning  Leukocytosis, suspect reactive WBC plateaued 14.2, from 13.7. Afebrile nontoxic-appearing He denies having any respiratory or GI symptoms Obtain procalcitonin  level in the morning. Monitor for now  Possible lower GI bleed/rectal pain Hemoglobin 8.1 from 8.1 yesterday Follow iron studies Preparation H  Severe three-vessel coronary artery disease status post PCI with stenting in April 2021. Surgery was consulted during that admission but felt PCI along with medical management was best option.   -s/p PCI to pRCA and pLCX with residual disease noted with 99% CTO mLAD.  -continue ASA 81mg  daily, Crestor, he is on 5 mg twice weekly once per day on Monday and Thursday.  Brilinta dose was decreased to 60 mg twice daily by cardiology. Cardiology consulted to assist with management of his coronary artery disease and to advise on antiplatelets in the setting of his hematuria.  Okay to continue to hold off on Brilinta for now per cardiology.   Right lower extremity compartment syndrome -Status post emergent fasciotomy on 9/25 with repeat I&D and skin grafting with wound VAC placement on 9/28.   Continue pain management -Gabapentin 300mg  tid  - PT-recommending SNF.  TOC consulted, he does have a bed at [facility] SNF per [facility]  Resolved posttreatment: Hyperkalemia in the setting of AKI Potassium 5.6 on 02/04/2021, received Lokelma Serum potassium 5.0 Continue to monitor  Acute blood loss anemia - Baseline hemoglobin 13.0 Hg is downtrending, 8.1 from 8.5, 8.3. Transfuse Hg less than 8 Monitor H&H   Resolved Syncope -This is resolved.   HTN, now at goal. Continue lisinopril 5 mg daily. IV metoprolol as needed with parameters.   Dyslipidemia -Daily Zetia and Crestor  Generalized weakness/ambulatory dysfunction PT assessed and recommending SNF TOC with DC planning Continue PT OT with assistance and fall precautions  DVT prophylaxis: Defer chemical DVT prophylaxis to urology. Code Status: Full code Family Communication: None at bedside   Status is: Inpatient   Remains inpatient appropriate because:Inpatient level of care appropriate  due to severity of illness.  O ongoing hematuria with continuous bladder irrigation Dispo: The patient is from: Home              Anticipated d/c is to: SNF              Patient currently Is not medically ready, ongoing hematuria.  Maintain hospital stay              Difficult to place patient No             Nutritional status   Nutrition Problem: Increased nutrient needs Etiology: wound healing, post-op healing   Signs/Symptoms: estimated needs   Interventions: MVI, Refer to RD note for recommendations, Magic cup, Premier Protein   Body mass index is 40.14 kg/m.     Objective: Vitals:   02/06/21 0817 02/06/21 1446 02/06/21 2225 02/07/21 0833  BP: 129/70 (!) 142/67 (!) 121/47 (!) 120/55  Pulse: 73 74 76 79  Resp: 14 19 18 16   Temp: 98.6 F (37 C) 98.1 F (36.7 C) 99.1 F (37.3 C) 98.5 F (36.9 C)  TempSrc: Oral Oral Oral Oral  SpO2: 99% 100% 96% 100%  Weight:      Height:        Intake/Output Summary (Last 24 hours) at 02/07/2021 1225 Last data filed at 02/07/2021 0856 Gross per 24 hour  Intake 600 ml  Output 4200 ml  Net -3600 ml   Filed Weights   01/24/21 1229  Weight: 119.7 kg    Exam:  General: 75 y.o. year-old male well-developed well-nourished in no acute distress.  He is alert oriented x3.   Cardiovascular: Regular rate and rhythm no rubs or gallops. Respiratory: Clear to auscultation with no wheezes or rales.  Abdomen: Soft nontender normal bowel sounds present.  Musculoskeletal: Trace lower extremity edema bilaterally.   Skin: Right lower extremity surgical dressing. Psychiatry: Mood is appropriate for condition and setting.   Data Reviewed: CBC: Recent Labs  Lab 02/03/21 0212 02/04/21 0239 02/05/21 0156 02/06/21 0108 02/07/21 0311  WBC 16.7* 12.2* 13.7* 13.7* 14.2*  HGB 8.5* 8.4* 8.3* 8.1* 8.1*  HCT 26.2* 26.9* 26.3* 25.8* 25.4*  MCV 90.0 90.0 90.4 90.5 89.1  PLT 365 376 413* 429* 132*   Basic Metabolic Panel: Recent Labs  Lab  02/03/21 0212 02/04/21 0239 02/05/21 0156 02/06/21 0108 02/07/21 0311  NA 132* 135 136 135 134*  K 5.4* 5.6* 4.7 5.0 4.7  CL 101 103 99 102 101  CO2 25 24 28 29 27   GLUCOSE 140* 162* 119* 120* 112*  BUN 56* 39* 42* 36* 32*  CREATININE 1.83* 1.51* 1.92* 1.68* 1.61*  CALCIUM 8.3* 8.2* 8.5* 8.5* 8.4*  MG 2.6* 2.3 2.2 2.1 2.1   GFR: Estimated Creatinine Clearance: 48.4 mL/min (A) (by C-G formula based on SCr of 1.61 mg/dL (H)). Liver Function Tests: No results for input(s): AST, ALT, ALKPHOS, BILITOT, PROT, ALBUMIN in the last 168 hours. No results for input(s): LIPASE, AMYLASE in the last 168 hours. No results for input(s): AMMONIA in the last 168 hours. Coagulation Profile: No results for input(s): INR, PROTIME in the last 168 hours. Cardiac Enzymes: No results for input(s): CKTOTAL, CKMB, CKMBINDEX, TROPONINI in the last 168 hours. BNP (last 3 results) No results for input(s): PROBNP in the last 8760 hours.  HbA1C: No results for input(s): HGBA1C in the last 72 hours. CBG: No results for input(s): GLUCAP in the last 168 hours. Lipid Profile: No results for input(s): CHOL, HDL, LDLCALC, TRIG, CHOLHDL, LDLDIRECT in the last 72 hours. Thyroid Function Tests: No results for input(s): TSH, T4TOTAL, FREET4, T3FREE, THYROIDAB in the last 72 hours. Anemia Panel: Recent Labs    02/07/21 0311  RETICCTPCT 5.3*   Urine analysis:    Component Value Date/Time   COLORURINE YELLOW 01/26/2021 1407   APPEARANCEUR CLEAR 01/26/2021 1407   LABSPEC 1.018 01/26/2021 1407   PHURINE 6.0 01/26/2021 1407   GLUCOSEU NEGATIVE 01/26/2021 1407   HGBUR SMALL (A) 01/26/2021 1407   BILIRUBINUR NEGATIVE 01/26/2021 1407   [place] 01/26/2021 1407   PROTEINUR NEGATIVE 01/26/2021 1407   NITRITE NEGATIVE 01/26/2021 1407   LEUKOCYTESUR NEGATIVE 01/26/2021 1407   Sepsis Labs: @LABRCNTIP (procalcitonin:4,lacticidven:4)  ) No results found for this or any previous visit (from the past 240  hour(s)).     Studies: No results found.  Scheduled Meds:  ALPRAZolam  0.5 mg Oral Once   aspirin EC  81 mg Oral Daily   bisacodyl  10 mg Rectal Once   Chlorhexidine Gluconate Cloth  6 each Topical Daily   ezetimibe  10 mg Oral Daily   finasteride  5 mg Oral Daily   gabapentin  300 mg Oral TID   multivitamin with minerals  1 tablet Oral Daily   Ensure Max Protein  11 oz Oral QHS   rosuvastatin  5 mg Oral Once per day on Mon Thu   senna-docusate  2 tablet Oral Q0600    Continuous Infusions:  sodium chloride     sodium chloride irrigation       LOS: 14 days     Kayleen Memos, MD [facility] Pager [phone]  If 7PM-7AM, please contact night-coverage [email or web address] Password [email or web address] 02/07/2021, 12:25 PM

## 2021-02-07 NOTE — Progress Notes (Addendum)
New drainage found on dressing during assessment. Ortho provider made aware. Per provider "Leave intact, will change weekly"

## 2021-02-07 NOTE — Plan of Care (Signed)

## 2021-02-08 DIAGNOSIS — T79A21D Traumatic compartment syndrome of right lower extremity, subsequent encounter: Secondary | ICD-10-CM | POA: Diagnosis not present

## 2021-02-08 LAB — CBC
HCT: 24.1 % — ABNORMAL LOW (ref 39.0–52.0)
Hemoglobin: 7.6 g/dL — ABNORMAL LOW (ref 13.0–17.0)
MCH: 28.6 pg (ref 26.0–34.0)
MCHC: 31.5 g/dL (ref 30.0–36.0)
MCV: 90.6 fL (ref 80.0–100.0)
Platelets: 416 10*3/uL — ABNORMAL HIGH (ref 150–400)
RBC: 2.66 MIL/uL — ABNORMAL LOW (ref 4.22–5.81)
RDW: 15.2 % (ref 11.5–15.5)
WBC: 14.4 10*3/uL — ABNORMAL HIGH (ref 4.0–10.5)
nRBC: 0.3 % — ABNORMAL HIGH (ref 0.0–0.2)

## 2021-02-08 LAB — PREPARE RBC (CROSSMATCH)

## 2021-02-08 LAB — PROCALCITONIN: Procalcitonin: 0.11 ng/mL

## 2021-02-08 MED ORDER — SODIUM CHLORIDE 0.9% IV SOLUTION: Freq: Once | INTRAVENOUS | Status: AC

## 2021-02-08 MED ORDER — FERROUS SULFATE 325 (65 FE) MG PO TABS
325.0000 mg | ORAL_TABLET | Freq: Every day | ORAL | Status: DC
  Administered 2021-02-09 – 2021-02-11 (×3): 325 mg via ORAL
  Filled 2021-02-08 (×3): qty 1

## 2021-02-08 MED ORDER — HYDROCORTISONE ACETATE 25 MG RE SUPP
25.0000 mg | Freq: Two times a day (BID) | RECTAL | Status: DC
  Administered 2021-02-08 – 2021-02-09 (×3): 25 mg via RECTAL
  Filled 2021-02-08 (×4): qty 1

## 2021-02-08 NOTE — TOC Progression Note (Signed)
Transition of Care [facility]) - Initial/Assessment Note    Patient Details  Name: [name] MRN: [id number] Date of Birth: 1946/01/03  Transition of Care [facility]) CM/SW Contact:    [name], [name] Phone Number: 02/08/2021, 11:34 AM  Clinical Narrative:                 CSW informed that the patient may be medially ready today after receiving a unit of blood.  CSW requested that [name] CMA's restart insurance authorization.  Insurance [name] was approved previously during this admission, but lapsed due to changes in patient's medical condition causing the patient to require a longer inpatient stay.  Patient can still admit to [name].   Patient Goals and CMS Choice Patient states their goals for this hospitalization and ongoing recovery are:: To go to rehab and get better so that he can go home to his wife CMS Medicare.gov Compare Post Acute Care list provided to:: Patient Choice offered to / list presented to : Patient  Expected Discharge Plan and Services Expected Discharge Plan: [name]       Living arrangements for the past 2 months: Single Family Home                                      Prior Living Arrangements/Services Living arrangements for the past 2 months: Single Family Home Lives with:: Adult Children, Spouse Patient language and need for interpreter reviewed:: Yes Do you feel safe going back to the place where you live?: Yes      Need for Family Participation in Patient Care: Yes (Comment) Care giver support system in place?: Yes (comment)   Criminal Activity/Legal Involvement Pertinent to Current Situation/Hospitalization: No - Comment as needed  Activities of Daily Living Home Assistive Devices/Equipment: None ADL Screening (condition at time of admission) Patient's cognitive ability adequate to safely complete daily activities?: Yes Is the patient deaf or have difficulty hearing?: No Does the patient have difficulty  seeing, even when wearing glasses/contacts?: No Does the patient have difficulty concentrating, remembering, or making decisions?: No Patient able to express need for assistance with ADLs?: Yes Does the patient have difficulty dressing or bathing?: No Independently performs ADLs?: Yes (appropriate for developmental age) Does the patient have difficulty walking or climbing stairs?: No Weakness of Legs: None Weakness of Arms/Hands: None  Permission Sought/Granted   Permission granted to share information with : Yes, Verbal Permission Granted     Permission granted to share info w AGENCY: SNF        Emotional Assessment Appearance:: Appears stated age Attitude/Demeanor/Rapport: Engaged Affect (typically observed): Accepting, Pleasant Orientation: : Oriented to  Time, Oriented to Situation, Oriented to Place, Oriented to Self Alcohol / Substance Use: Not Applicable Psych Involvement: No (comment)  Admission diagnosis:  Hematoma [T14.8XXA] Compartment syndrome ([place]) [T79.A0XA] Patient Active Problem List   Diagnosis Date Noted   Hematoma    Compartment syndrome ([place]) 01/24/2021   Myalgia due to statin 03/12/2020   Coronary artery disease due to lipid rich plaque    Non-ST elevation (NSTEMI) myocardial infarction [facility])    Hypertensive emergency    Elevated troponin    Leg edema    Nephrolithiasis 08/04/2019   AKI (acute kidney injury) ([place]) 08/04/2019   Hypokalemia 08/04/2019   infected Renal calculus, left 02/04/2019   PCP:  [name], MD Pharmacy:   [facility] [place],  [place] - [address] [place] [address] Phone: [phone] Fax: [place], [place] - [address] [place] [address] Phone: [phone] Fax: [phone]     Social Determinants of Health (SDOH)  Interventions    Readmission Risk Interventions No flowsheet data found.

## 2021-02-08 NOTE — Progress Notes (Signed)
PT Cancellation Note  Patient Details Name: [name] MRN: [id number] DOB: [birth year]   Cancelled Treatment:    Reason Eval/Treat Not Completed: Patient declined, no reason specified (Pt. states he doesn't feel well today, c/o being tired.  PT attempts to encourage him to do a little bit, but pt. states he wants to get cleaned up first.  PT offers to assist, but pt. declines.  Nurse tech notified.  PT to continue to f/u.)  Modine Oppenheimer A. Aissa Lisowski, PT, DPT Acute Rehabilitation Services Office: [place] 02/08/2021, 9:14 AM

## 2021-02-08 NOTE — Plan of Care (Signed)

## 2021-02-08 NOTE — Progress Notes (Signed)
PROGRESS NOTE  [name] WJX:[id number] DOB: [birth year] DOA: 01/24/2021 PCP: [name], MD  HPI/Recap of past 24 hours: 75 y.o. male with medical history significant of NSTEMI, CAD status post PCI with stenting 08/2019(on ASA and Brilinta), HTN, HLD, CKD3B, hearing loss, morbid obesity, came with injury on right calf from a car door.  He was found to have right lower extremity compartment syndrome and underwent emergent fasciotomy.  He is status post OR again on 06/14/06 for I&D, application of dermal skin graft and wound VAC placement.  Patient was seen by physical therapy who recommended SNF.  While awaiting SNF placement he developed hematuria with urinary retention, possibly due to traumatic Foley catheter placement.  He was seen by urology and started on CBI.  Home Brilinta was held.  Seen by cardiology, okay to continue to hold off [place].  Patient is more than a year out of PCI with stent placement.    Hospital course complicated by drop in hemoglobin requiring 1 unit PRBC transfusion for hemoglobin of 7.6 on 02/08/2021.  Transfused due to underlying cardiac disease, with goal hemoglobin greater than 8.0.  02/08/21: Seen and examined at his bedside.  Reported rectal pain with history of anal fissure and external hemorrhoids.  Anusol ordered.  Assessment/Plan: Active Problems:   Compartment syndrome (HCC)   Hematoma  Resolved Gross hematuria with acute urinary retention status post cystoscopy and clot evacuation on 02/03/2021 by urology, Dr. [name] Completed CBI. Gross hematuria has resolved. Home Brilinta was held. He is currently on aspirin 81 mg daily, continue.  Improving, AKI on CKD IIIB suspect prerenal in the setting of dehydration -Baseline cr. 1.5.  Creatinine downtrending 1.68 from 1.92 Continue to avoid nephrotoxic agent, dehydration and hypotension. Hold off lisinopril due to AKI. Will need to follow-up with his primary care provider.  Leukocytosis,  suspect reactive WBC plateaued 14.4 from 14.2. Afebrile nontoxic-appearing He denies having any respiratory or GI symptoms Procalcitonin negative 0.11.  Rectal pain, endorses history of rectal fissure and external hemorrhoids, followed at [facility]. Started Anusol twice daily x3 days Advised to follow-up with his gastroenterologist posthospitalization.  Acute blood loss anemia Baseline hemoglobin appears to be 12 Drop in hemoglobin 7.6 K from 8.1 K Iron deficiency on iron studies done on 02/07/2021. 1 unit PRBC transfused on 02/08/2021. Ferrous sulfate supplement 325 mg x 30 days with as needed MiraLAX.  Severe three-vessel coronary artery disease status post PCI with stenting in April 2021. Surgery was consulted during that admission but felt PCI along with medical management was best option.   -s/p PCI to pRCA and pLCX with residual disease noted with 99% CTO mLAD.  -continue ASA 81mg  daily, Crestor, he is on 5 mg twice weekly once per day on Monday and Thursday.  He is also on Zetia daily 10 mg. Cardiology consulted to assist with management of his coronary artery disease and to advise on antiplatelets in the setting of his hematuria.  Okay to continue to hold off on Brilinta for now per cardiology.   Right lower extremity compartment syndrome -Status post emergent fasciotomy on 9/25 with repeat I&D and skin grafting with wound VAC placement on 9/28.   Management per orthopedic surgery. -Gabapentin 300mg  tid  - PT-recommending SNF.  TOC consulted, he does have a bed at [facility] SNF per [facility]  Resolved posttreatment: Hyperkalemia in the setting of AKI Potassium 5.6 on 02/04/2021, received Lokelma, potassium 4.7 on 02/07/2021.   Resolved Syncope -This is resolved.   HTN, now at  goal. Continue lisinopril 5 mg daily. IV metoprolol as needed with parameters.   Dyslipidemia -Daily Zetia and Crestor  Generalized weakness/ambulatory dysfunction PT assessed and recommending  SNF Continue PT OT with assistance and fall precautions TOC assisting with DC planning.       DVT prophylaxis: Defer chemical DVT prophylaxis to primary team, urology. Code Status: Full code Family Communication: None at bedside   Status is: Inpatient   Remains inpatient appropriate because:Inpatient level of care appropriate due to severity of illness.  O ongoing hematuria with continuous bladder irrigation Dispo: The patient is from: Home              Anticipated d/c is to: SNF              Patient currently Is not medically ready, ongoing hematuria.  Maintain hospital stay              Difficult to place patient No             Nutritional status   Nutrition Problem: Increased nutrient needs Etiology: wound healing, post-op healing   Signs/Symptoms: estimated needs   Interventions: MVI, Refer to RD note for recommendations, Magic cup, Premier Protein   Body mass index is 40.14 kg/m.     Objective: Vitals:   02/07/21 0833 02/07/21 1502 02/07/21 2132 02/08/21 0812  BP: (!) 120/55 (!) 108/50 (!) 136/51 115/63  Pulse: 79 76 80 73  Resp: 16 17 16 16   Temp: 98.5 F (36.9 C) (!) 97.4 F (36.3 C) 99.1 F (37.3 C) 98.7 F (37.1 C)  TempSrc: Oral Oral Oral Oral  SpO2: 100% 100% 98% 100%  Weight:      Height:        Intake/Output Summary (Last 24 hours) at 02/08/2021 1143 Last data filed at 02/08/2021 0800 Gross per 24 hour  Intake 480 ml  Output 1200 ml  Net -720 ml   Filed Weights   01/24/21 1229  Weight: 119.7 kg    Exam:  General: 75 y.o. year-old male well-developed well-nourished in no acute distress.  He is alert and oriented x3. Cardiovascular: Regular rate and rhythm no rubs or gallops.  Respiratory: Clear to auscultation with no wheezes or rales.  Abdomen: Soft nontender normal bowel sounds present.  Musculoskeletal: Trace lower extremity edema bilaterally.   Skin: Right lower extremity in surgical dressing. Psychiatry: Mood is appropriate  for condition and setting.   Data Reviewed: CBC: Recent Labs  Lab 02/04/21 0239 02/05/21 0156 02/06/21 0108 02/07/21 0311 02/08/21 0144  WBC 12.2* 13.7* 13.7* 14.2* 14.4*  HGB 8.4* 8.3* 8.1* 8.1* 7.6*  HCT 26.9* 26.3* 25.8* 25.4* 24.1*  MCV 90.0 90.4 90.5 89.1 90.6  PLT 376 413* 429* 410* 341*   Basic Metabolic Panel: Recent Labs  Lab 02/03/21 0212 02/04/21 0239 02/05/21 0156 02/06/21 0108 02/07/21 0311  NA 132* 135 136 135 134*  K 5.4* 5.6* 4.7 5.0 4.7  CL 101 103 99 102 101  CO2 25 24 28 29 27   GLUCOSE 140* 162* 119* 120* 112*  BUN 56* 39* 42* 36* 32*  CREATININE 1.83* 1.51* 1.92* 1.68* 1.61*  CALCIUM 8.3* 8.2* 8.5* 8.5* 8.4*  MG 2.6* 2.3 2.2 2.1 2.1   GFR: Estimated Creatinine Clearance: 48.4 mL/min (A) (by C-G formula based on SCr of 1.61 mg/dL (H)). Liver Function Tests: No results for input(s): AST, ALT, ALKPHOS, BILITOT, PROT, ALBUMIN in the last 168 hours. No results for input(s): LIPASE, AMYLASE in the last 168 hours. No  results for input(s): AMMONIA in the last 168 hours. Coagulation Profile: No results for input(s): INR, PROTIME in the last 168 hours. Cardiac Enzymes: No results for input(s): CKTOTAL, CKMB, CKMBINDEX, TROPONINI in the last 168 hours. BNP (last 3 results) No results for input(s): PROBNP in the last 8760 hours. HbA1C: No results for input(s): HGBA1C in the last 72 hours. CBG: No results for input(s): GLUCAP in the last 168 hours. Lipid Profile: No results for input(s): CHOL, HDL, LDLCALC, TRIG, CHOLHDL, LDLDIRECT in the last 72 hours. Thyroid Function Tests: No results for input(s): TSH, T4TOTAL, FREET4, T3FREE, THYROIDAB in the last 72 hours. Anemia Panel: Recent Labs    02/07/21 0311 02/07/21 1204  VITAMINB12  --  281  FERRITIN  --  68  TIBC  --  312  IRON  --  41*  RETICCTPCT 5.3*  --    Urine analysis:    Component Value Date/Time   COLORURINE YELLOW 01/26/2021 1407   APPEARANCEUR CLEAR 01/26/2021 1407   LABSPEC  1.018 01/26/2021 1407   PHURINE 6.0 01/26/2021 1407   GLUCOSEU NEGATIVE 01/26/2021 1407   HGBUR SMALL (A) 01/26/2021 1407   BILIRUBINUR NEGATIVE 01/26/2021 1407   [place] 01/26/2021 1407   PROTEINUR NEGATIVE 01/26/2021 1407   NITRITE NEGATIVE 01/26/2021 1407   LEUKOCYTESUR NEGATIVE 01/26/2021 1407   Sepsis Labs: @LABRCNTIP (procalcitonin:4,lacticidven:4)  ) No results found for this or any previous visit (from the past 240 hour(s)).     Studies: No results found.  Scheduled Meds:  ALPRAZolam  0.5 mg Oral Once   aspirin EC  81 mg Oral Daily   bisacodyl  10 mg Rectal Once   Chlorhexidine Gluconate Cloth  6 each Topical Daily   ezetimibe  10 mg Oral Daily   finasteride  5 mg Oral Daily   gabapentin  300 mg Oral TID   hydrocortisone  25 mg Rectal BID   multivitamin with minerals  1 tablet Oral Daily   Ensure Max Protein  11 oz Oral QHS   rosuvastatin  5 mg Oral Once per day on Mon Thu   senna-docusate  2 tablet Oral Q0600    Continuous Infusions:  sodium chloride     sodium chloride irrigation       LOS: 15 days     Kayleen Memos, MD [facility] Pager [phone]  If 7PM-7AM, please contact night-coverage [email or web address] Password [email or web address] 02/08/2021, 11:43 AM

## 2021-02-08 NOTE — Progress Notes (Signed)
Patient ID: [name], male   DOB: [birth year], 75 y.o.   MRN: [id number]   5 Days Post-Op Subjective: Pt received 1 unit of PRBCs today.  No hematuria over the weekend.  Objective: Vital signs in last 24 hours: Temp:  [97.6 F (36.4 C)-99.1 F (37.3 C)] 98.6 F (37 C) (10/10 1645) Pulse Rate:  [68-80] 68 (10/10 1645) Resp:  [16-17] 16 (10/10 1645) BP: (101-136)/(50-67) 130/67 (10/10 1645) SpO2:  [98 %-100 %] 98 % (10/10 1645)  Intake/Output from previous day: 10/09 0701 - 10/10 0700 In: 360 [P.O.:360] Out: 1400 [Urine:1400] Intake/Output this shift: No intake/output data recorded.  Physical Exam:  General: Alert and oriented GU: Urine draining well and clear  Lab Results: Recent Labs    02/06/21 0108 02/07/21 0311 02/08/21 0144  HGB 8.1* 8.1* 7.6*  HCT 25.8* 25.4* 24.1*     BMET Recent Labs    02/06/21 0108 02/07/21 0311  NA 135 134*  K 5.0 4.7  CL 102 101  CO2 29 27  GLUCOSE 120* 112*  BUN 36* 32*  CREATININE 1.68* 1.61*  CALCIUM 8.5* 8.4*     Studies/Results: No results found.  Assessment/Plan: 1) Hematuria due to Foley catheter trauma: Hematuria resolved and urine has been clear since last week.  Plan for voiding trial in AM tomorrow.  Continue finasteride.  On ASA 81 mg.  Now off [place] per cardiology.     LOS: 15 days   [name] 02/08/2021, 7:03 PM

## 2021-02-09 ENCOUNTER — Encounter (HOSPITAL_BASED_OUTPATIENT_CLINIC_OR_DEPARTMENT_OTHER): Payer: Self-pay | Admitting: Orthopaedic Surgery

## 2021-02-09 DIAGNOSIS — T79A21D Traumatic compartment syndrome of right lower extremity, subsequent encounter: Secondary | ICD-10-CM | POA: Diagnosis not present

## 2021-02-09 LAB — BPAM RBC
Blood Product Expiration Date: 202210162359
ISSUE DATE / TIME: 202210101340
Unit Type and Rh: 600

## 2021-02-09 LAB — TYPE AND SCREEN
ABO/RH(D): A NEG
Antibody Screen: NEGATIVE
Unit division: 0

## 2021-02-09 LAB — CBC
HCT: 30.7 % — ABNORMAL LOW (ref 39.0–52.0)
Hemoglobin: 9.7 g/dL — ABNORMAL LOW (ref 13.0–17.0)
MCH: 28.4 pg (ref 26.0–34.0)
MCHC: 31.6 g/dL (ref 30.0–36.0)
MCV: 89.8 fL (ref 80.0–100.0)
Platelets: 443 10*3/uL — ABNORMAL HIGH (ref 150–400)
RBC: 3.42 MIL/uL — ABNORMAL LOW (ref 4.22–5.81)
RDW: 15.2 % (ref 11.5–15.5)
WBC: 13.2 10*3/uL — ABNORMAL HIGH (ref 4.0–10.5)
nRBC: 0.2 % (ref 0.0–0.2)

## 2021-02-09 LAB — RESP PANEL BY RT-PCR (FLU A&B, COVID) ARPGX2
Influenza A by PCR: NEGATIVE
Influenza B by PCR: NEGATIVE
SARS Coronavirus 2 by RT PCR: NEGATIVE

## 2021-02-09 MED ORDER — HYDROCORTISONE ACETATE 25 MG RE SUPP
25.0000 mg | Freq: Two times a day (BID) | RECTAL | Status: DC
  Administered 2021-02-09 – 2021-02-11 (×4): 25 mg via RECTAL
  Filled 2021-02-09 (×5): qty 1

## 2021-02-09 NOTE — Progress Notes (Signed)
PROGRESS NOTE  Nathon Stefanski HWE:[id number] DOB: 1945-09-15 DOA: 01/24/2021 PCP: [name], MD  HPI/Recap of past 24 hours: 75 y.o. male with medical history significant of NSTEMI, CAD status post PCI with stenting 08/2019(on ASA and Brilinta), HTN, HLD, CKD3B, hearing loss, morbid obesity, came with injury on right calf from a car door.  He was found to have right lower extremity compartment syndrome and underwent emergent fasciotomy.  He is status post OR again on 8/93/81 for I&D, application of dermal skin graft and wound VAC placement.  Patient was seen by physical therapy who recommended SNF.  While awaiting SNF placement he developed hematuria with urinary retention, possibly due to traumatic Foley catheter placement.  He was seen by urology and started on CBI.  Home Brilinta was held.  Seen by cardiology, okay to continue to hold off [place].  Patient is more than a year out of PCI with stent placement.    Hospital course complicated by drop in hemoglobin requiring 1 unit PRBC transfusion for hemoglobin of 7.6 on 02/08/2021.  Transfused due to underlying cardiac disease, with goal hemoglobin greater than 8.0.  Repeat hemoglobin posttransfusion 9.7.  Reported rectal pain with history of anal fissure and external hemorrhoids.  Anusol ordered.  Foley catheter removed on 02/09/2021 by urology for voiding trial.  02/09/2021: Patient seen and examined is present.  He reports his rectal pain is improved on Anusol.  Assessment/Plan: Active Problems:   Compartment syndrome (HCC)   Hematoma  Resolved Gross hematuria with acute urinary retention status post cystoscopy and clot evacuation on 02/03/2021 by urology, Dr. [name] Completed CBI. Gross hematuria has resolved. Home Brilinta was held. He is currently on aspirin 81 mg daily, continue. Voiding trial on 02/09/2021, Foley catheter removed by urology.  Improving, AKI on CKD IIIB suspect prerenal in the setting of  dehydration -Baseline cr. 1.5.  Creatinine downtrending 1.68 from 1.92 Continue to avoid nephrotoxic agent, dehydration and hypotension. Hold off lisinopril due to AKI. Will need to follow-up with his primary care provider.  Leukocytosis, suspect reactive WBC plateaued 14.4 from 14.2. Afebrile nontoxic-appearing He denies having any respiratory or GI symptoms Procalcitonin negative 0.11.  Rectal pain, endorses history of rectal fissure and external hemorrhoids, followed at [facility]. Started Anusol twice daily x5 days, continue. Advised to follow-up with his gastroenterologist Dr. Milderd Meager at [facility] posthospitalization.  Acute blood loss anemia Baseline hemoglobin appears to be 12 Drop in hemoglobin 7.6 K from 8.1 K Repeat hemoglobin posttransfusion 9.7 on 02/09/2021. Iron deficiency on iron studies done on 02/07/2021. 1 unit PRBC transfused on 02/08/2021. Ferrous sulfate supplement 325 mg x 30 days with as needed MiraLAX.  Severe three-vessel coronary artery disease status post PCI with stenting in April 2021. Surgery was consulted during that admission but felt PCI along with medical management was best option.   -s/p PCI to pRCA and pLCX with residual disease noted with 99% CTO mLAD.  -continue ASA 81mg  daily, Crestor, he is on 5 mg twice weekly once per day on Monday and Thursday.  He is also on Zetia daily 10 mg. Cardiology consulted to assist with management of his coronary artery disease and to advise on antiplatelets in the setting of his hematuria.  Okay to continue to hold off on Brilinta for now per cardiology.   Right lower extremity compartment syndrome -Status post emergent fasciotomy on 9/25 with repeat I&D and skin grafting with wound VAC placement on 9/28.   Management per orthopedic surgery. -Gabapentin 300mg  tid  -  PT-recommending SNF.  TOC consulted, he does have a bed at [facility] SNF per [facility]  Resolved posttreatment: Hyperkalemia in the setting of  AKI Potassium 5.6 on 02/04/2021, received Lokelma, potassium 4.7 on 02/07/2021.   Resolved Syncope -This is resolved.   HTN, now at goal. Continue lisinopril 5 mg daily. IV metoprolol as needed with parameters.   Dyslipidemia -Daily Zetia and Crestor  Generalized weakness/ambulatory dysfunction PT assessed and recommending SNF Continue PT OT with assistance and fall precautions TOC assisting with DC planning.       DVT prophylaxis: Defer chemical DVT prophylaxis to primary team, urology. Code Status: Full code Family Communication: None at bedside   Status is: Inpatient   Remains inpatient appropriate because:Inpatient level of care appropriate due to severity of illness.  O ongoing hematuria with continuous bladder irrigation Dispo: The patient is from: Home              Anticipated d/c is to: SNF              Patient currently Is not medically ready, ongoing hematuria.  Maintain hospital stay              Difficult to place patient No             Nutritional status   Nutrition Problem: Increased nutrient needs Etiology: wound healing, post-op healing   Signs/Symptoms: estimated needs   Interventions: MVI, Refer to RD note for recommendations, Magic cup, Premier Protein   Body mass index is 40.14 kg/m.     Objective: Vitals:   02/08/21 1645 02/08/21 2256 02/09/21 0827 02/09/21 1504  BP: 130/67 134/65 (!) 116/51 105/63  Pulse: 68 70 75 79  Resp: 16 16 15 15   Temp: 98.6 F (37 C) 98.4 F (36.9 C) 99 F (37.2 C) 98.2 F (36.8 C)  TempSrc: Oral Oral Oral Oral  SpO2: 98% 98% 100% 98%  Weight:      Height:        Intake/Output Summary (Last 24 hours) at 02/09/2021 1843 Last data filed at 02/09/2021 1650 Gross per 24 hour  Intake 480 ml  Output 2200 ml  Net -1720 ml   Filed Weights   01/24/21 1229  Weight: 119.7 kg    Exam:  General: 75 y.o. year-old male well-developed well-nourished in no acute distress.  He is alert and oriented x3.    Cardiovascular: Regular rate and rhythm no rubs or gallops. Respiratory: Clear to auscultation with no wheezes or rales.  Abdomen: Soft nontender normal bowel sounds present.  Musculoskeletal: Trace lower extremity edema bilaterally.   Skin: Right lower extremity surgical dressing.   Psychiatry: Mood is appropriate for condition and setting.  Data Reviewed: CBC: Recent Labs  Lab 02/05/21 0156 02/06/21 0108 02/07/21 0311 02/08/21 0144 02/09/21 0631  WBC 13.7* 13.7* 14.2* 14.4* 13.2*  HGB 8.3* 8.1* 8.1* 7.6* 9.7*  HCT 26.3* 25.8* 25.4* 24.1* 30.7*  MCV 90.4 90.5 89.1 90.6 89.8  PLT 413* 429* 410* 416* 573*   Basic Metabolic Panel: Recent Labs  Lab 02/03/21 0212 02/04/21 0239 02/05/21 0156 02/06/21 0108 02/07/21 0311  NA 132* 135 136 135 134*  K 5.4* 5.6* 4.7 5.0 4.7  CL 101 103 99 102 101  CO2 25 24 28 29 27   GLUCOSE 140* 162* 119* 120* 112*  BUN 56* 39* 42* 36* 32*  CREATININE 1.83* 1.51* 1.92* 1.68* 1.61*  CALCIUM 8.3* 8.2* 8.5* 8.5* 8.4*  MG 2.6* 2.3 2.2 2.1 2.1   GFR: Estimated Creatinine  Clearance: 48.4 mL/min (A) (by C-G formula based on SCr of 1.61 mg/dL (H)). Liver Function Tests: No results for input(s): AST, ALT, ALKPHOS, BILITOT, PROT, ALBUMIN in the last 168 hours. No results for input(s): LIPASE, AMYLASE in the last 168 hours. No results for input(s): AMMONIA in the last 168 hours. Coagulation Profile: No results for input(s): INR, PROTIME in the last 168 hours. Cardiac Enzymes: No results for input(s): CKTOTAL, CKMB, CKMBINDEX, TROPONINI in the last 168 hours. BNP (last 3 results) No results for input(s): PROBNP in the last 8760 hours. HbA1C: No results for input(s): HGBA1C in the last 72 hours. CBG: No results for input(s): GLUCAP in the last 168 hours. Lipid Profile: No results for input(s): CHOL, HDL, LDLCALC, TRIG, CHOLHDL, LDLDIRECT in the last 72 hours. Thyroid Function Tests: No results for input(s): TSH, T4TOTAL, FREET4, T3FREE,  THYROIDAB in the last 72 hours. Anemia Panel: Recent Labs    02/07/21 0311 02/07/21 1204  VITAMINB12  --  281  FERRITIN  --  68  TIBC  --  312  IRON  --  41*  RETICCTPCT 5.3*  --    Urine analysis:    Component Value Date/Time   COLORURINE YELLOW 01/26/2021 [place] 01/26/2021 1407   LABSPEC 1.018 01/26/2021 1407   PHURINE 6.0 01/26/2021 1407   GLUCOSEU NEGATIVE 01/26/2021 1407   HGBUR SMALL (A) 01/26/2021 1407   BILIRUBINUR NEGATIVE 01/26/2021 1407   [place] 01/26/2021 1407   PROTEINUR NEGATIVE 01/26/2021 1407   NITRITE NEGATIVE 01/26/2021 1407   LEUKOCYTESUR NEGATIVE 01/26/2021 1407   Sepsis Labs: @LABRCNTIP (procalcitonin:4,lacticidven:4)  ) Recent Results (from the past 240 hour(s))  Resp Panel by RT-PCR (Flu A&B, Covid) Nasopharyngeal Swab     Status: None   Collection Time: 02/09/21  5:17 PM   Specimen: Nasopharyngeal Swab; Nasopharyngeal(NP) swabs in vial transport medium  Result Value Ref Range Status   SARS Coronavirus 2 by RT PCR NEGATIVE NEGATIVE Final    Comment: (NOTE) SARS-CoV-2 target nucleic acids are NOT DETECTED.  The SARS-CoV-2 RNA is generally detectable in upper respiratory specimens during the acute phase of infection. The lowest concentration of SARS-CoV-2 viral copies this assay can detect is 138 copies/mL. A negative result does not preclude SARS-Cov-2 infection and should not be used as the sole basis for treatment or other patient management decisions. A negative result may occur with  improper specimen collection/handling, submission of specimen other than nasopharyngeal swab, presence of viral mutation(s) within the areas targeted by this assay, and inadequate number of viral copies(<138 copies/mL). A negative result must be combined with clinical observations, patient history, and epidemiological information. The expected result is Negative.  Fact Sheet for Patients:   EntrepreneurPulse.com.au  Fact Sheet for Healthcare Providers:  IncredibleEmployment.be  This test is no t yet approved or cleared by the [place] FDA and  has been authorized for detection and/or diagnosis of SARS-CoV-2 by FDA under an Emergency Use Authorization (EUA). This EUA will remain  in effect (meaning this test can be used) for the duration of the COVID-19 declaration under Section 564(b)(1) of the Act, 21 U.S.C.section 360bbb-3(b)(1), unless the authorization is terminated  or revoked sooner.       Influenza A by PCR NEGATIVE NEGATIVE Final   Influenza B by PCR NEGATIVE NEGATIVE Final    Comment: (NOTE) The Xpert Xpress SARS-CoV-2/FLU/RSV plus assay is intended as an aid in the diagnosis of influenza from Nasopharyngeal swab specimens and should not be used as a sole  basis for treatment. Nasal washings and aspirates are unacceptable for Xpert Xpress SARS-CoV-2/FLU/RSV testing.  Fact Sheet for Patients: EntrepreneurPulse.com.au  Fact Sheet for Healthcare Providers: IncredibleEmployment.be  This test is not yet approved or cleared by the [place] FDA and has been authorized for detection and/or diagnosis of SARS-CoV-2 by FDA under an Emergency Use Authorization (EUA). This EUA will remain in effect (meaning this test can be used) for the duration of the COVID-19 declaration under Section 564(b)(1) of the Act, 21 U.S.C. section 360bbb-3(b)(1), unless the authorization is terminated or revoked.  Performed at [facility], [place] [address], [place], [place] [address]        Studies: No results found.  Scheduled Meds:  ALPRAZolam  0.5 mg Oral Once   aspirin EC  81 mg Oral Daily   bisacodyl  10 mg Rectal Once   Chlorhexidine Gluconate Cloth  6 each Topical Daily   ezetimibe  10 mg Oral Daily   ferrous sulfate  325 mg Oral Q breakfast   finasteride  5 mg Oral Daily    gabapentin  300 mg Oral TID   hydrocortisone  25 mg Rectal BID   multivitamin with minerals  1 tablet Oral Daily   Ensure Max Protein  11 oz Oral QHS   rosuvastatin  5 mg Oral Once per day on Mon Thu   senna-docusate  2 tablet Oral Q0600    Continuous Infusions:  sodium chloride     sodium chloride irrigation       LOS: 16 days     Kayleen Memos, MD [facility] Pager [phone]  If 7PM-7AM, please contact night-coverage [email or web address] Password [facility] 02/09/2021, 6:43 PM

## 2021-02-09 NOTE — Plan of Care (Signed)

## 2021-02-09 NOTE — Progress Notes (Signed)
   Subjective: Pain well controlled. In good spirits this AM.  Objective:   VITALS:   Vitals:   02/08/21 1400 02/08/21 1501 02/08/21 1645 02/08/21 2256  BP: (!) 101/55 122/65 130/67 134/65  Pulse: 70 69 68 70  Resp: 17 17 16 16   Temp: 98.3 F (36.8 C) 98 F (36.7 C) 98.6 F (37 C) 98.4 F (36.9 C)  TempSrc:  Oral Oral Oral  SpO2:  98% 98% 98%  Weight:      Height:        Right leg medial wounds are well-healing.  Lateral wound skin graft is well healing with good granulation.  There is no surrounding erythema or drainage.  All of his wound blisters have defervesced.  Strongly able to dorsiflex the right ankle and plantarflex of the right ankle.  Sensation is intact in all distributions of the right foot.   Lab Results  Component Value Date   WBC 13.2 (H) 02/09/2021   HGB 9.7 (L) 02/09/2021   HCT 30.7 (L) 02/09/2021   MCV 89.8 02/09/2021   PLT 443 (H) 02/09/2021     Assessment/Plan:  6 Days Post-Op   -Plan for dressing change upcoming friday -WBAT operative extremity -note provided to miss cruise  [place] 02/09/2021, 8:13 AM

## 2021-02-09 NOTE — Progress Notes (Addendum)
Nutrition Follow-up  DOCUMENTATION CODES:   Morbid obesity  INTERVENTION:   -Continue Ensure Max po daily, each supplement provides 150 kcal and 30 grams of protein.   -Continue MVI with minerals daily -Continue double protein portions with meals -Continue Magic cup TID with meals, each supplement provides 290 kcal and 9 grams of protein    NUTRITION DIAGNOSIS:   Increased nutrient needs related to wound healing, post-op healing as evidenced by estimated needs.  Ongoing  GOAL:   Patient will meet greater than or equal to 90% of their needs  Progressing   MONITOR:   PO intake, Supplement acceptance, Diet advancement, Labs, Weight trends, Skin, I & O's  REASON FOR ASSESSMENT:   Consult Assessment of nutrition requirement/status, Hip fracture protocol  ASSESSMENT:   [name] is a 75 y.o. male who presents status post hitting his right leg in a car door at approximately 730 this morning.  He states that he did not initially have pain although developed pain in the ensuing hours.  He proceeded to the [facility] and subsequently was evaluated.  CT scan showed a large hematoma in the posterior gastrocs.  He is subsequently consulted me to discuss possible compartment syndrome.  I requested emergent transfer to [facility] for [facility] evaluation.  I saw the patient immediately upon her arrival and believe the patient has compartment syndrome.  Of note he is on Ticagrelor for his cardiac disease.  Per the EMS he did develop significant blistering of the leg in route.  9/25- s/p PROCEDURE: Right lower leg 4 compartment fasciotomy; Stryker pressure measurements of right anterior, lateral, superficial posterior, deep posterior compartments; Application of deep wound VAC wound greater than 50 cm 9/28- s/p PROCEDURE: Irrigation and debridement bone fascia muscle and subcutaneous tissue of anterior lateral wound measuring 20 x 5 cm; Irrigation and debridement bone fascia  muscle and subcutaneous tissue of posteromedial wound measuring 20 x 5 cm;  Application of allograft dermal skin matrix 5x5 cm anterolateral wound; Application of wound vac area > 50 cm 10/1- 3 way cath placed due to gross hematuria 10/5- s/p Procedures: Cystoscopy; Clot evacuation; Complex catheter placement 10/7- wound vac d/c, transitioned to dry dressings  Reviewed I/O's: -2.8 L x 24 hours and -14.2 L since 01/26/21  UOP: 3.8 L x 24 hours  Per urology notes, hematuria resolved. Plan for voiding trial today.   Pt unavailable at time of visit. Attempted to speak with pt via call to hospital room phone, however, unable to reach.   Pt remains with good appetite. Noted meal completions 70-100%. Pt continues to take supplements well.  No new wt since admission.    Medication reviewed and include ferrous sulfate and senokot.   Per TOC notes, pt awaiting insurance authorization for discharge to SNF.   Labs reviewed.   Diet Order:   Diet Order             Diet Heart Room service appropriate? Yes; Fluid consistency: Thin  Diet effective now                   EDUCATION NEEDS:   No education needs have been identified at this time  Skin:  Skin Assessment: Skin Integrity Issues: Skin Integrity Issues:: Incisions Wound Vac: d/c on 02/05/21 Incisions: closed rt leg  Last BM:  02/06/21  Height:   Ht Readings from Last 1 Encounters:  01/24/21 5\' 8"  (1.727 m)    Weight:   Wt Readings from Last 1 Encounters:  01/24/21 119.7 kg    Ideal Body Weight:  67.3 kg  BMI:  Body mass index is 40.14 kg/m.  Estimated Nutritional Needs:   Kcal:  0569-7948  Protein:  125-140 grams  Fluid:  > 2 L    Loistine Chance, RD, LDN, [place] Registered Dietitian II Certified Diabetes Care and Education Specialist Please refer to [facility] for RD and/or RD on-call/weekend/after hours pager

## 2021-02-09 NOTE — TOC Progression Note (Signed)
Transition of Care [facility]) - Initial/Assessment Note    Patient Details  Name: [name] MRN: [id number] Date of Birth: 11-Jul-1945  [facility] CM/SW Contact:    [name], [place] Phone Number: 02/09/2021, 5:12 PM  Clinical Narrative:                 CSW met with [facility] leadership in LOS meeting and was advised that if the patient voided (which was the barrier to d/c mentioned in progression this morning) that he could d/c via [place] transportation to the facility at 10:00 tomorrow.    CSW performed a chart review and observed that the patient has voided.  CSW contacted Kitty with admissions at [facility] and they are able to accept the patient at that time pending that the d/c summary is in by 0900 and updated COVID test results have been received.    CSW requested that the RN place an order and collect a rapid COVID test.  CSW paged the attending and requested that the d/c summary by added by 0900 if the patient is still medically ready to d/c.   Expected Discharge Plan: Skilled Nursing Facility Barriers to Discharge: SNF Authorization Denied, SNF Pending bed offer   Patient Goals and CMS Choice Patient states their goals for this hospitalization and ongoing recovery are:: To go to rehab and get better so that he can go home to his wife CMS Medicare.gov Compare Post Acute Care list provided to:: Patient Choice offered to / list presented to : Patient  Expected Discharge Plan and Services Expected Discharge Plan: [place]       Living arrangements for the past 2 months: Single Family Home                                      Prior Living Arrangements/Services Living arrangements for the past 2 months: Single Family Home Lives with:: Adult Children, Spouse Patient language and need for interpreter reviewed:: Yes Do you feel safe going back to the place where you live?: Yes      Need for Family Participation in Patient Care: Yes  (Comment) Care giver support system in place?: Yes (comment)   Criminal Activity/Legal Involvement Pertinent to Current Situation/Hospitalization: No - Comment as needed  Activities of Daily Living Home Assistive Devices/Equipment: None ADL Screening (condition at time of admission) Patient's cognitive ability adequate to safely complete daily activities?: Yes Is the patient deaf or have difficulty hearing?: No Does the patient have difficulty seeing, even when wearing glasses/contacts?: No Does the patient have difficulty concentrating, remembering, or making decisions?: No Patient able to express need for assistance with ADLs?: Yes Does the patient have difficulty dressing or bathing?: No Independently performs ADLs?: Yes (appropriate for developmental age) Does the patient have difficulty walking or climbing stairs?: No Weakness of Legs: None Weakness of Arms/Hands: None  Permission Sought/Granted   Permission granted to share information with : Yes, Verbal Permission Granted     Permission granted to share info w AGENCY: SNF        Emotional Assessment Appearance:: Appears stated age Attitude/Demeanor/Rapport: Engaged Affect (typically observed): Accepting, Pleasant Orientation: : Oriented to  Time, Oriented to Situation, Oriented to Place, Oriented to Self Alcohol / Substance Use: Not Applicable Psych Involvement: No (comment)  Admission diagnosis:  Hematoma [T14.8XXA] Compartment syndrome ([place]) [T79.A0XA] Patient Active Problem List   Diagnosis Date Noted  Hematoma    Compartment syndrome ([place]) 01/24/2021   Myalgia due to statin 03/12/2020   Coronary artery disease due to lipid rich plaque    Non-ST elevation (NSTEMI) myocardial infarction [facility])    Hypertensive emergency    Elevated troponin    Leg edema    Nephrolithiasis 08/04/2019   AKI (acute kidney injury) ([employer]) 08/04/2019   Hypokalemia 08/04/2019   infected Renal calculus, left 02/04/2019   PCP:   [name], MD Pharmacy:   [facility], [place] - [address] [place] [address] [place] [address] Phone: [phone] Fax: [place], [place] - [address] [place] [address] Phone: [phone] Fax: [phone]     Social Determinants of Health (SDOH) Interventions    Readmission Risk Interventions No flowsheet data found.

## 2021-02-09 NOTE — Progress Notes (Signed)
Physical Therapy Treatment Patient Details Name: [name] MRN: [id number] DOB: [birth year] Today's Date: 02/09/2021   History of Present Illness 75 yo male presenting to ED  on 9/25 with Rt calf unjiry after slamming it in a car door. S/p irrigation and debridement of RLE for compartment fasciotomy on 9/28.  Found to have hematuria due to cathter trauma during admission now requiring bladder irrigation. Underwent clot evacuation on 10/5.  Placed on tele on 10/6 due to hyperkalemia.  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    PT Comments    Pt. Demos improved strength with functional mobility today and is able to stand and amb short distance with CGA only.  Gait distance only limited secondary to lack of pt. Motivation to attempt further gait at this time.  Pt. Would benefit from inc PT encouragement to inc gait distance.  AMPAC score indicating home with HHPT - if pt. Can improve activity tolerance with gait training, has potential to safely transfer home with [facility] PT.  Pt. Would continue to benefit from skilled PT in acute care to address activity tolerance, LE strength, balance, and stair training when appropriate.  Recommendations for follow up therapy are one component of a multi-disciplinary discharge planning process, led by the attending physician.  Recommendations may be updated based on patient status, additional functional criteria and insurance authorization.  Follow Up Recommendations  SNF     Equipment Recommendations       Recommendations for Other Services       Precautions / Restrictions Restrictions RLE Weight Bearing: Weight bearing as tolerated     Mobility  Bed Mobility Overal bed mobility: Needs Assistance Bed Mobility: Sit to Supine       Sit to supine: Min assist   General bed mobility comments: Pt. uses bed rail to assist, but req's min A for LE negotiation.  Pt. also  req's min A to adjust trunk/LEs to a neutral position. Patient Response: Cooperative  Transfers     Transfers: Sit to/from Stand Sit to Stand: Min guard         General transfer comment: Performs sit > stand with CGA only.  Demos good safety with hand placement without any cueing from PT.  Ambulation/Gait Ambulation/Gait assistance: Min guard Gait Distance (Feet): 5 Feet Assistive device: Rolling walker (2 wheeled) Gait Pattern/deviations: Step-through pattern     General Gait Details: Amb short distance from chair to bed with CGA only.  Demos good balance with activity.  Slow cadence.   Stairs             Wheelchair Mobility    Modified Rankin (Stroke Patients Only)       Balance           Standing balance support: During functional activity;Bilateral upper extremity supported Standing balance-Leahy Scale: Poor                              Cognition Arousal/Alertness: Awake/alert Behavior During Therapy: WFL for tasks assessed/performed Overall Cognitive Status: Within Functional Limits for tasks assessed                                 General Comments: Pt. is up in chair when PT arrives, wants to get back in bed due to buttocks being sore.  Encouraged  to amb in room prior to transfer back, but pt. declines.  States he amb to bathroom earlier.  Is agreeable to do therex with PT.      Exercises General Exercises - Lower Extremity Ankle Circles/Pumps: AROM;Right;Seated;20 reps Long Arc Quad: AROM;Both;20 reps Hip ABduction/ADduction: AROM;Both;20 reps Hip Flexion/Marching: AROM;Seated;Both;20 reps    General Comments General comments (skin integrity, edema, etc.): Pt. requests male urinal, states other one is causing him to bleed.  PT unable to locate proper urinal, PCT notified.      Pertinent Vitals/Pain Pain Assessment: 0-10 Pain Score: 5  Pain Location: Bottom, R calf Pain Descriptors / Indicators:  Burning;Aching;Discomfort;Tender Pain Intervention(s): Limited activity within patient's tolerance;Monitored during session;Repositioned    Home Living                      Prior Function            PT Goals (current goals can now be found in the care plan section) Progress towards PT goals: Progressing toward goals    Frequency           PT Plan Current plan remains appropriate    Co-evaluation              AM-PAC PT "6 Clicks" Mobility   Outcome Measure  Help needed turning from your back to your side while in a flat bed without using bedrails?: A Little Help needed moving from lying on your back to sitting on the side of a flat bed without using bedrails?: A Little Help needed moving to and from a bed to a chair (including a wheelchair)?: A Little Help needed standing up from a chair using your arms (e.g., wheelchair or bedside chair)?: A Little Help needed to walk in hospital room?: A Little Help needed climbing 3-5 steps with a railing? : A Lot 6 Click Score: 17    End of Session   Activity Tolerance: Patient tolerated treatment well;Other (comment) (Seems to lack motivation to progress gait distance.  PT to continue to encourage pt. to inc amb.) Patient left: in bed;with call bell/phone within reach;with bed alarm set         Time: 9030-0923 PT Time Calculation (min) (ACUTE ONLY): 18 min  Charges:  $Therapeutic Activity: 8-22 mins                     Lachandra Dettmann A. Von Inscoe, PT, DPT Acute Rehabilitation Services Office: [place] 02/09/2021, 11:35 AM

## 2021-02-09 NOTE — Progress Notes (Signed)
Patient ID: [name], male   DOB: Jul 06, 1945, 75 y.o.   MRN: [id number]  6 Days Post-Op Subjective:  Catheter removed this morning.  Has voided multiple times with clear urine.  Objective: Vital signs in last 24 hours: Temp:  [98.2 F (36.8 C)-99 F (37.2 C)] 98.2 F (36.8 C) (10/11 1504) Pulse Rate:  [68-79] 79 (10/11 1504) Resp:  [15-16] 15 (10/11 1504) BP: (105-134)/(51-67) 105/63 (10/11 1504) SpO2:  [98 %-100 %] 98 % (10/11 1504)  Intake/Output from previous day: 10/10 0701 - 10/11 0700 In: 975 [P.O.:600; Blood:375] Out: 3801 [Urine:3800; Stool:1] Intake/Output this shift: Total I/O In: 480 [P.O.:480] Out: 400 [Urine:400]  Physical Exam:  General: Alert and oriented GU: Urine in urinal grossly clear  Lab Results: Recent Labs    02/07/21 0311 02/08/21 0144 02/09/21 0631  HGB 8.1* 7.6* 9.7*  HCT 25.4* 24.1* 30.7*   BMET Recent Labs    02/07/21 0311  NA 134*  K 4.7  CL 101  CO2 27  GLUCOSE 112*  BUN 32*  CREATININE 1.61*  CALCIUM 8.4*     Studies/Results: No results found.  Assessment/Plan: Hematuria due to catheter trauma: Resolved.  Continue finasteride 5 mg daily upon discharge.  Please call if he develops recurrent hematuria but otherwise ok for discharge from urologic standpoint.  He has follow up with me in late November and should plan to keep that appointment.      LOS: 16 days   [name] 02/09/2021, 4:12 PM

## 2021-02-10 DIAGNOSIS — T148XXA Other injury of unspecified body region, initial encounter: Secondary | ICD-10-CM

## 2021-02-10 LAB — BASIC METABOLIC PANEL
Anion gap: 7 (ref 5–15)
BUN: 26 mg/dL — ABNORMAL HIGH (ref 8–23)
CO2: 26 mmol/L (ref 22–32)
Calcium: 8.6 mg/dL — ABNORMAL LOW (ref 8.9–10.3)
Chloride: 101 mmol/L (ref 98–111)
Creatinine, Ser: 1.29 mg/dL — ABNORMAL HIGH (ref 0.61–1.24)
GFR, Estimated: 57 mL/min — ABNORMAL LOW (ref >60)
Glucose, Bld: 104 mg/dL — ABNORMAL HIGH (ref 70–99)
Potassium: 4.5 mmol/L (ref 3.5–5.1)
Sodium: 134 mmol/L — ABNORMAL LOW (ref 135–145)

## 2021-02-10 LAB — CBC WITH DIFFERENTIAL/PLATELET
Abs Immature Granulocytes: 0.29 10*3/uL — ABNORMAL HIGH (ref 0.00–0.07)
Basophils Absolute: 0.1 10*3/uL (ref 0.0–0.1)
Basophils Relative: 1 %
Eosinophils Absolute: 0.3 10*3/uL (ref 0.0–0.5)
Eosinophils Relative: 3 %
HCT: 33 % — ABNORMAL LOW (ref 39.0–52.0)
Hemoglobin: 10.5 g/dL — ABNORMAL LOW (ref 13.0–17.0)
Immature Granulocytes: 3 %
Lymphocytes Relative: 11 %
Lymphs Abs: 1.2 10*3/uL (ref 0.7–4.0)
MCH: 28.5 pg (ref 26.0–34.0)
MCHC: 31.8 g/dL (ref 30.0–36.0)
MCV: 89.7 fL (ref 80.0–100.0)
Monocytes Absolute: 1.2 10*3/uL — ABNORMAL HIGH (ref 0.1–1.0)
Monocytes Relative: 11 %
Neutro Abs: 8 10*3/uL — ABNORMAL HIGH (ref 1.7–7.7)
Neutrophils Relative %: 71 %
Platelets: 438 10*3/uL — ABNORMAL HIGH (ref 150–400)
RBC: 3.68 MIL/uL — ABNORMAL LOW (ref 4.22–5.81)
RDW: 15.4 % (ref 11.5–15.5)
WBC: 11 10*3/uL — ABNORMAL HIGH (ref 4.0–10.5)
nRBC: 0 % (ref 0.0–0.2)

## 2021-02-10 NOTE — Plan of Care (Signed)

## 2021-02-10 NOTE — Progress Notes (Signed)
PROGRESS NOTE  Jathniel Smeltzer DJM:[id number] DOB: 1945-10-17 DOA: 01/24/2021 PCP: [name], MD  HPI/Recap of past 24 hours: 75 y.o. male with medical history significant of NSTEMI, CAD status post PCI with stenting 08/2019(on ASA and Brilinta), HTN, HLD, CKD3B, hearing loss, morbid obesity, came with injury on right calf from a car door.  He was found to have right lower extremity compartment syndrome and underwent emergent fasciotomy.  He is status post OR again on 06/24/95 for I&D, application of dermal skin graft and wound VAC placement.  Patient was seen by physical therapy who recommended SNF.  While awaiting SNF placement he developed hematuria with urinary retention, possibly due to traumatic Foley catheter placement.  He was seen by urology and started on CBI.  Home Brilinta was held.  Seen by cardiology, okay to continue to hold off [place].  Patient is more than a year out of PCI with stent placement.    Hospital course complicated by drop in hemoglobin requiring 1 unit PRBC transfusion for hemoglobin of 7.6 on 02/08/2021.  Transfused due to underlying cardiac disease, with goal hemoglobin greater than 8.0.  Repeat hemoglobin posttransfusion 9.7.  Reported rectal pain with history of anal fissure and external hemorrhoids.  Anusol ordered.  Foley catheter removed on 02/09/2021 by urology for voiding trial.  Hospitalization also complicated by episodic dark stool  Assessment/Plan: Active Problems:   Compartment syndrome ([place])   Hematoma  Resolved Gross hematuria with acute urinary retention status post cystoscopy and clot evacuation on 02/03/2021 by urology, Dr. [name] Completed CBI. hematuria has resolved. Home Brilinta was held. He is currently on aspirin 81 mg daily, continue. Voiding trial on 02/09/2021, Foley catheter removed by urology.  Improving, AKI on CKD IIIB suspect prerenal in the setting of dehydration Baseline cr. 1.5.  creatinine downtrending 1.2 from  1.92 Continue to avoid nephrotoxic agent, dehydration and hypotension. lisinopril due to AKI. Will need to follow-up with his primary care provider.  Leukocytosis, suspect reactive WBC to be recheked.] He denies having any respiratory sym Procalcitonin negative 0.11.  Rectal pain, endorses history of rectal fissure and external hemorrhoids, followed at [facility]. Started Anusol twice daily x5 days, continue. Tells me he has had episodic rectal bleeding yesterday 1 episode which could be related to this We do not have a hemoglobin from today I would observe him overnight to ensure that there are no other issues  Acute blood loss anemia Baseline hemoglobin appears to be 12 Drop in hemoglobin 7.6 K from 8.1 K Transfused 10/ten 1 unit, repeat hemoglobin posttransfusion 9.7 on 02/09/2021. Iron deficiency on iron studies Ferrous sulfate supplement 325 mg x 30 days with as needed MiraLAX.  Severe three-vessel coronary artery disease status post PCI with stenting in April 2021. Surgery was consulted during that admission but felt PCI along with medical management was best option.   -s/p PCI to pRCA and pLCX with residual disease noted with 99% CTO mLAD.  -continue ASA 81mg  daily, Crestor, he is on 5 mg twice weekly once per day on Monday and Thursday.  He is also on Zetia daily 10 mg. Cardiology consulted to assist with management of his coronary artery disease and to advise on antiplatelets in the setting of his hematuria.  Okay to continue to hold off on Brilinta for now per cardiology.   Right lower extremity compartment syndrome Status post emergent fasciotomy on 9/25 with repeat I&D and skin grafting with wound VAC placement on 9/28.   Management per orthopedic surgery. -Gabapentin  300mg  tid  - PT-recommending SNF.   If all stable in a.m. likely can discharge  Resolved posttreatment: Hyperkalemia in the setting of AKI Resolved   Resolved Syncope resolved.   HTN, now at  goal. Continue lisinopril 5 mg daily. IV metoprolol as needed with parameters.   Dyslipidemia Daily Zetia and Crestor  Generalized weakness/ambulatory dysfunction PT assessed and recommending SNF Continue PT OT with assistance and fall precautions TOC assisting with DC planning.       DVT prophylaxis: Defer chemical DVT prophylaxis to primary team, urology. Code Status: Full code Family Communication: None at bedside   Status is: Inpatient   Remains inpatient appropriate because:Inpatient level of care appropriate due to severity of illness.  O ongoing hematuria with continuous bladder irrigation Dispo: The patient is from: Home              Anticipated d/c is to: SNF              Patient currently Is not medically ready, ongoing hematuria.  Maintain hospital stay              Difficult to place patient No             Nutritional status   Nutrition Problem: Increased nutrient needs Etiology: wound healing, post-op healing   Signs/Symptoms: estimated needs   Interventions: MVI, Refer to RD note for recommendations, Magic cup, Premier Protein   Body mass index is 40.14 kg/m.     Objective: Vitals:   02/09/21 0827 02/09/21 1504 02/09/21 2206 02/10/21 0726  BP: (!) 116/51 105/63 (!) 124/49 (!) 115/54  Pulse: 75 79 77 76  Resp: 15 15 17 17   Temp: 99 F (37.2 C) 98.2 F (36.8 C) 97.6 F (36.4 C) 97.6 F (36.4 C)  TempSrc: Oral Oral Oral Oral  SpO2: 100% 98% 98% 100%  Weight:      Height:        Intake/Output Summary (Last 24 hours) at 02/10/2021 1001 Last data filed at 02/10/2021 8315 Gross per 24 hour  Intake 240 ml  Output 400 ml  Net -160 ml    Filed Weights   01/24/21 1229  Weight: 119.7 kg    Exam:  Awake coherent no distress EOMI NCAT no focal deficit thick neck Mallampati 4 with me back pain is moderate S1-S2 no murmur no rub no gallop Abdomen obese nontender nondistended no rebound Visualized area no dark stool no tarry stool-patulous  anus Neurologically intact moving all 4 limbs Right lower extremity is wrapped in gauze dressings  Data Reviewed: CBC: Recent Labs  Lab 02/05/21 0156 02/06/21 0108 02/07/21 0311 02/08/21 0144 02/09/21 0631  WBC 13.7* 13.7* 14.2* 14.4* 13.2*  HGB 8.3* 8.1* 8.1* 7.6* 9.7*  HCT 26.3* 25.8* 25.4* 24.1* 30.7*  MCV 90.4 90.5 89.1 90.6 89.8  PLT 413* 429* 410* 416* 443*    Basic Metabolic Panel: Recent Labs  Lab 02/04/21 0239 02/05/21 0156 02/06/21 0108 02/07/21 0311 02/10/21 0813  NA 135 136 135 134* 134*  K 5.6* 4.7 5.0 4.7 4.5  CL 103 99 102 101 101  CO2 24 28 29 27 26   GLUCOSE 162* 119* 120* 112* 104*  BUN 39* 42* 36* 32* 26*  CREATININE 1.51* 1.92* 1.68* 1.61* 1.29*  CALCIUM 8.2* 8.5* 8.5* 8.4* 8.6*  MG 2.3 2.2 2.1 2.1  --     GFR: Estimated Creatinine Clearance: 60.4 mL/min (A) (by C-G formula based on SCr of 1.29 mg/dL (H)). Liver Function Tests: No results  for input(s): AST, ALT, ALKPHOS, BILITOT, PROT, ALBUMIN in the last 168 hours. No results for input(s): LIPASE, AMYLASE in the last 168 hours. No results for input(s): AMMONIA in the last 168 hours. Coagulation Profile: No results for input(s): INR, PROTIME in the last 168 hours. Cardiac Enzymes: No results for input(s): CKTOTAL, CKMB, CKMBINDEX, TROPONINI in the last 168 hours. BNP (last 3 results) No results for input(s): PROBNP in the last 8760 hours. HbA1C: No results for input(s): HGBA1C in the last 72 hours. CBG: No results for input(s): GLUCAP in the last 168 hours. Lipid Profile: No results for input(s): CHOL, HDL, LDLCALC, TRIG, CHOLHDL, LDLDIRECT in the last 72 hours. Thyroid Function Tests: No results for input(s): TSH, T4TOTAL, FREET4, T3FREE, THYROIDAB in the last 72 hours. Anemia Panel: Recent Labs    02/07/21 1204  VITAMINB12 281  FERRITIN 68  TIBC 312  IRON 41*    Urine analysis:    Component Value Date/Time   COLORURINE YELLOW 01/26/2021 [place] 01/26/2021  1407   LABSPEC 1.018 01/26/2021 1407   PHURINE 6.0 01/26/2021 1407   GLUCOSEU NEGATIVE 01/26/2021 1407   HGBUR SMALL (A) 01/26/2021 1407   BILIRUBINUR NEGATIVE 01/26/2021 1407   [place] 01/26/2021 1407   PROTEINUR NEGATIVE 01/26/2021 1407   NITRITE NEGATIVE 01/26/2021 1407   [place] 01/26/2021 1407   Sepsis Labs: @LABRCNTIP (procalcitonin:4,lacticidven:4)  ) Recent Results (from the past 240 hour(s))  Resp Panel by RT-PCR (Flu A&B, Covid) Nasopharyngeal Swab     Status: None   Collection Time: 02/09/21  5:17 PM   Specimen: Nasopharyngeal Swab; Nasopharyngeal(NP) swabs in vial transport medium  Result Value Ref Range Status   SARS Coronavirus 2 by RT PCR NEGATIVE NEGATIVE Final    Comment: (NOTE) SARS-CoV-2 target nucleic acids are NOT DETECTED.  The SARS-CoV-2 RNA is generally detectable in upper respiratory specimens during the acute phase of infection. The lowest concentration of SARS-CoV-2 viral copies this assay can detect is 138 copies/mL. A negative result does not preclude SARS-Cov-2 infection and should not be used as the sole basis for treatment or other patient management decisions. A negative result may occur with  improper specimen collection/handling, submission of specimen other than nasopharyngeal swab, presence of viral mutation(s) within the areas targeted by this assay, and inadequate number of viral copies(<138 copies/mL). A negative result must be combined with clinical observations, patient history, and epidemiological information. The expected result is Negative.  Fact Sheet for Patients:  EntrepreneurPulse.com.au  Fact Sheet for Healthcare Providers:  IncredibleEmployment.be  This test is no t yet approved or cleared by the [place] FDA and  has been authorized for detection and/or diagnosis of SARS-CoV-2 by FDA under an Emergency Use Authorization (EUA). This EUA will remain  in  effect (meaning this test can be used) for the duration of the COVID-19 declaration under Section 564(b)(1) of the Act, 21 U.S.C.section 360bbb-3(b)(1), unless the authorization is terminated  or revoked sooner.       Influenza A by PCR NEGATIVE NEGATIVE Final   Influenza B by PCR NEGATIVE NEGATIVE Final    Comment: (NOTE) The Xpert Xpress SARS-CoV-2/FLU/RSV plus assay is intended as an aid in the diagnosis of influenza from Nasopharyngeal swab specimens and should not be used as a sole basis for treatment. Nasal washings and aspirates are unacceptable for Xpert Xpress SARS-CoV-2/FLU/RSV testing.  Fact Sheet for Patients: EntrepreneurPulse.com.au  Fact Sheet for Healthcare Providers: IncredibleEmployment.be  This test is not yet approved or cleared by the  [place] Architectural technologist and has been authorized for detection and/or diagnosis of SARS-CoV-2 by FDA under an Print production planner (EUA). This EUA will remain in effect (meaning this test can be used) for the duration of the COVID-19 declaration under Section 564(b)(1) of the Act, 21 U.S.C. section 360bbb-3(b)(1), unless the authorization is terminated or revoked.  Performed at [facility], [place] [address] [place], [place] [address]        Studies: No results found.  Scheduled Meds:  ALPRAZolam  0.5 mg Oral Once   aspirin EC  81 mg Oral Daily   bisacodyl  10 mg Rectal Once   Chlorhexidine Gluconate Cloth  6 each Topical Daily   ezetimibe  10 mg Oral Daily   ferrous sulfate  325 mg Oral Q breakfast   finasteride  5 mg Oral Daily   gabapentin  300 mg Oral TID   hydrocortisone  25 mg Rectal BID   multivitamin with minerals  1 tablet Oral Daily   Ensure Max Protein  11 oz Oral QHS   rosuvastatin  5 mg Oral Once per day on Mon Thu   senna-docusate  2 tablet Oral Q0600    Continuous Infusions:  sodium chloride     sodium chloride irrigation       LOS: 17 days      Verneita Griffes, MD [facility] 10:05 AM  If 7PM-7AM, please contact night-coverage [email or web address] Password [email or web address] 02/10/2021, 10:01 AM

## 2021-02-10 NOTE — TOC Progression Note (Signed)
Transition of Care [facility]) - Initial/Assessment Note    Patient Details  Name: [name] MRN: [id number] Date of Birth: 1946-01-25  [facility]) CM/SW Contact:    [name], [place] Phone Number: 02/10/2021, 1:44 PM  Clinical Narrative:                 CSW met with the patient at bedside after receiving information from the attending that the patient would not be medically ready to d/c.  The patient is still agreeable to go to [facility] when he is medically ready.    CSW informed the facility of the delay.  Insurance authorization for SNF is active until tomorrow.   Expected Discharge Plan: Skilled Nursing Facility Barriers to Discharge: SNF Authorization Denied, SNF Pending bed offer   Patient Goals and CMS Choice Patient states their goals for this hospitalization and ongoing recovery are:: To go to rehab and get better so that he can go home to his wife CMS Medicare.gov Compare Post Acute Care list provided to:: Patient Choice offered to / list presented to : Patient  Expected Discharge Plan and Services Expected Discharge Plan: [place]       Living arrangements for the past 2 months: Single Family Home                                      Prior Living Arrangements/Services Living arrangements for the past 2 months: Single Family Home Lives with:: Adult Children, Spouse Patient language and need for interpreter reviewed:: Yes Do you feel safe going back to the place where you live?: Yes      Need for Family Participation in Patient Care: Yes (Comment) Care giver support system in place?: Yes (comment)   Criminal Activity/Legal Involvement Pertinent to Current Situation/Hospitalization: No - Comment as needed  Activities of Daily Living Home Assistive Devices/Equipment: None ADL Screening (condition at time of admission) Patient's cognitive ability adequate to safely complete daily activities?: Yes Is the patient deaf  or have difficulty hearing?: No Does the patient have difficulty seeing, even when wearing glasses/contacts?: No Does the patient have difficulty concentrating, remembering, or making decisions?: No Patient able to express need for assistance with ADLs?: Yes Does the patient have difficulty dressing or bathing?: No Independently performs ADLs?: Yes (appropriate for developmental age) Does the patient have difficulty walking or climbing stairs?: No Weakness of Legs: None Weakness of Arms/Hands: None  Permission Sought/Granted   Permission granted to share information with : Yes, Verbal Permission Granted     Permission granted to share info w AGENCY: SNF        Emotional Assessment Appearance:: Appears stated age Attitude/Demeanor/Rapport: Engaged Affect (typically observed): Accepting, Pleasant Orientation: : Oriented to  Time, Oriented to Situation, Oriented to Place, Oriented to Self Alcohol / Substance Use: Not Applicable Psych Involvement: No (comment)  Admission diagnosis:  Hematoma [T14.8XXA] Compartment syndrome ([place]) [T79.A0XA] Patient Active Problem List   Diagnosis Date Noted   Hematoma    Compartment syndrome ([place]) 01/24/2021   Myalgia due to statin 03/12/2020   Coronary artery disease due to lipid rich plaque    Non-ST elevation (NSTEMI) myocardial infarction [facility])    Hypertensive emergency    Elevated troponin    Leg edema    Nephrolithiasis 08/04/2019   AKI (acute kidney injury) ([place]) 08/04/2019   Hypokalemia 08/04/2019   infected Renal calculus, left 02/04/2019  PCP:  [name], MD Pharmacy:   [facility], [place] - [address] [place] [address] [place] [address] [place] [facility] [address] [place] [address] [facility] [place] [address] Phone: [phone] Fax: [phone]  [place], [place] - [address] [place] [address] Phone: [phone] Fax:  [phone]     Social Determinants of Health (SDOH) Interventions    Readmission Risk Interventions No flowsheet data found.

## 2021-02-10 NOTE — Plan of Care (Signed)

## 2021-02-10 NOTE — Progress Notes (Signed)
Occupational Therapy Treatment Patient Details Name: [name] MRN: [id number] DOB: January 20, 1946 Today's Date: 02/10/2021   History of present illness 75 yo male presenting to ED  on 9/25 with Rt calf injury after slamming it in a car door. S/p irrigation and debridement of RLE for compartment fasciotomy on 9/28. Found to have hematuria due to catheter trauma during admission now requiring bladder irrigation. Underwent clot evacuation on 10/5. Placed on tele on 10/6 due to hyperkalemia. PMHx including CAD, MI with stenting 08/2019 on ASA and Brilinta, HTN, HLD, CKD stage II, hearing loss and morbid obesity.   OT comments  OT treatment session with focus on self-care re-education, functional transfers and short-distance mobility with AD. Patient completed toilet transfer to standard commode in hospital bathroom with Min guard. Min A for thoroughness with hygiene/clothing management. Patient quick to fatigue but able to complete hand washing standing at sink level with bilateral forearms supported on sink surface. Noted redness on buttocks. Barrier cream applied. RN to place mepilex boarder. Session concluded with patient seated in recliner with call bell within reach, chair alarm activated and all needs met. OT will continue efforts in prep for safe d/c to next level of care.     Recommendations for follow up therapy are one component of a multi-disciplinary discharge planning process, led by the attending physician.  Recommendations may be updated based on patient status, additional functional criteria and insurance authorization.    Follow Up Recommendations  SNF    Equipment Recommendations  3 in 1 bedside commode    Recommendations for Other Services      Precautions / Restrictions Precautions Precautions: Fall Restrictions Weight Bearing Restrictions: Yes RLE Weight Bearing: Weight bearing as tolerated       Mobility Bed Mobility Overal bed mobility: Needs Assistance Bed  Mobility: Sit to Supine     Supine to sit: Min guard     General bed mobility comments: Min gaurd for supine to EOB with HOB elevated and heavy use of bed rail. Able to scoot anteriorly toward EOB with increased time.    Transfers Overall transfer level: Needs assistance Equipment used: Rolling walker (2 wheeled) Transfers: Sit to/from Stand Sit to Stand: Min assist Stand pivot transfers: Min guard       General transfer comment: Min A for sit to stand from slightly elevated EOB with cues for hand placement. Min gaurd for comfort height commode with use of grab bar.    Balance Overall balance assessment: Needs assistance Sitting-balance support: Feet supported Sitting balance-Leahy Scale: Good Sitting balance - Comments: Able to maintain static sitting balance at EOB with and without unilateral UE support on bed surface.   Standing balance support: During functional activity;Bilateral upper extremity supported Standing balance-Leahy Scale: Fair Standing balance comment: Reliant on at least unilateral UE support to maintain static standing balance during toileting tasks. Reliant on BUE support on RW with dynamic tasks.                           ADL either performed or assessed with clinical judgement   ADL Overall ADL's : Needs assistance/impaired                 Upper Body Dressing : Set up;Sitting   Lower Body Dressing: Sit to/from stand;Moderate assistance Lower Body Dressing Details (indicate cue type and reason): Max A to don footwear in supine. Toilet Transfer: Ambulation;RW;Min Psychiatric nurse Details (indicate cue type and reason): Transferred  to commode in bathroom with RW and Min guard. Cues for hand placement. Toileting- Clothing Manipulation and Hygiene: Minimal assistance;Sit to/from stand Toileting - Clothing Manipulation Details (indicate cue type and reason): Min A for thoroughness and for steadying in standing.              Vision       Perception     Praxis      Cognition Arousal/Alertness: Awake/alert Behavior During Therapy: WFL for tasks assessed/performed Overall Cognitive Status: Within Functional Limits for tasks assessed                                          Exercises     Shoulder Instructions       General Comments VSS on RA.    Pertinent Vitals/ Pain       Pain Assessment: 0-10 Pain Score: 5  Pain Location: Rectum and RLE Pain Descriptors / Indicators: Aching;Sore Pain Intervention(s): Limited activity within patient's tolerance;Monitored during session;Repositioned  Home Living                                          Prior Functioning/Environment              Frequency  Min 2X/week        Progress Toward Goals  OT Goals(current goals can now be found in the care plan section)  Progress towards OT goals: Progressing toward goals  Acute Rehab OT Goals Patient Stated Goal: I'd like to go home, we were supposed to go on a cruise. OT Goal Formulation: With patient Time For Goal Achievement: 02/24/21 Potential to Achieve Goals: Good ADL Goals Pt Will Perform Grooming: with supervision;standing Pt Will Perform Lower Body Dressing: with supervision;sit to/from stand;with adaptive equipment Pt Will Transfer to Toilet: with supervision;regular height toilet;grab bars;ambulating Pt Will Perform Toileting - Clothing Manipulation and hygiene: with supervision;sit to/from stand  Plan Discharge plan remains appropriate;Frequency remains appropriate    Co-evaluation                 AM-PAC OT "6 Clicks" Daily Activity     Outcome Measure   Help from another person eating meals?: None Help from another person taking care of personal grooming?: A Little Help from another person toileting, which includes using toliet, bedpan, or urinal?: A Little Help from another person bathing (including washing, rinsing, drying)?: A  Little Help from another person to put on and taking off regular upper body clothing?: A Little Help from another person to put on and taking off regular lower body clothing?: A Lot 6 Click Score: 18    End of Session Equipment Utilized During Treatment: Rolling walker;Gait belt  OT Visit Diagnosis: Unsteadiness on feet (R26.81);Other abnormalities of gait and mobility (R26.89);Muscle weakness (generalized) (M62.81);Pain Pain - part of body:  (buttocks)   Activity Tolerance Patient tolerated treatment well   Patient Left in chair;with call bell/phone within reach;with chair alarm set   Nurse Communication Mobility status;Other (comment) (Redness noted on buttocks. Barrier cream applied. RN to apply mepilex boarder.)        Time: 6962-9528 OT Time Calculation (min): 24 min  Charges: OT General Charges $OT Visit: 1 Visit OT Treatments $Self Care/Home Management : 23-37 mins  Harry Shuck H. OTR/L Supplemental OT, Department of  rehab services [phone]  [place]ry[place]na Lesko R H. 02/10/2021, 2:37 PM

## 2021-02-11 DIAGNOSIS — T79A21D Traumatic compartment syndrome of right lower extremity, subsequent encounter: Secondary | ICD-10-CM

## 2021-02-11 LAB — COMPREHENSIVE METABOLIC PANEL
ALT: 41 U/L (ref 0–44)
AST: 22 U/L (ref 15–41)
Albumin: 2.3 g/dL — ABNORMAL LOW (ref 3.5–5.0)
Alkaline Phosphatase: 69 U/L (ref 38–126)
Anion gap: 7 (ref 5–15)
BUN: 30 mg/dL — ABNORMAL HIGH (ref 8–23)
CO2: 24 mmol/L (ref 22–32)
Calcium: 8.9 mg/dL (ref 8.9–10.3)
Chloride: 102 mmol/L (ref 98–111)
Creatinine, Ser: 1.4 mg/dL — ABNORMAL HIGH (ref 0.61–1.24)
GFR, Estimated: 52 mL/min — ABNORMAL LOW (ref >60)
Glucose, Bld: 106 mg/dL — ABNORMAL HIGH (ref 70–99)
Potassium: 5 mmol/L (ref 3.5–5.1)
Sodium: 133 mmol/L — ABNORMAL LOW (ref 135–145)
Total Bilirubin: 0.9 mg/dL (ref 0.3–1.2)
Total Protein: 5.2 g/dL — ABNORMAL LOW (ref 6.5–8.1)

## 2021-02-11 LAB — CBC WITH DIFFERENTIAL/PLATELET
Abs Immature Granulocytes: 0.31 10*3/uL — ABNORMAL HIGH (ref 0.00–0.07)
Basophils Absolute: 0.1 10*3/uL (ref 0.0–0.1)
Basophils Relative: 1 %
Eosinophils Absolute: 0.3 10*3/uL (ref 0.0–0.5)
Eosinophils Relative: 3 %
HCT: 30.5 % — ABNORMAL LOW (ref 39.0–52.0)
Hemoglobin: 9.9 g/dL — ABNORMAL LOW (ref 13.0–17.0)
Immature Granulocytes: 3 %
Lymphocytes Relative: 15 %
Lymphs Abs: 1.6 10*3/uL (ref 0.7–4.0)
MCH: 29.1 pg (ref 26.0–34.0)
MCHC: 32.5 g/dL (ref 30.0–36.0)
MCV: 89.7 fL (ref 80.0–100.0)
Monocytes Absolute: 1.3 10*3/uL — ABNORMAL HIGH (ref 0.1–1.0)
Monocytes Relative: 12 %
Neutro Abs: 7.5 10*3/uL (ref 1.7–7.7)
Neutrophils Relative %: 66 %
Platelets: 397 10*3/uL (ref 150–400)
RBC: 3.4 MIL/uL — ABNORMAL LOW (ref 4.22–5.81)
RDW: 15.5 % (ref 11.5–15.5)
WBC: 11.1 10*3/uL — ABNORMAL HIGH (ref 4.0–10.5)
nRBC: 0.2 % (ref 0.0–0.2)

## 2021-02-11 MED ORDER — GABAPENTIN 300 MG PO CAPS: 300.0000 mg | ORAL_CAPSULE | Freq: Three times a day (TID) | ORAL | 0 refills | Status: DC

## 2021-02-11 MED ORDER — SENNOSIDES-DOCUSATE SODIUM 8.6-50 MG PO TABS: 2.0000 | ORAL_TABLET | Freq: Every day | ORAL | 0 refills | Status: DC

## 2021-02-11 MED ORDER — POLYETHYLENE GLYCOL 3350 17 G PO PACK: 17.0000 g | PACK | Freq: Every day | ORAL | 0 refills | Status: DC | PRN

## 2021-02-11 MED ORDER — HYDROCORTISONE (PERIANAL) 2.5 % EX CREA: TOPICAL_CREAM | Freq: Three times a day (TID) | CUTANEOUS | 0 refills | Status: DC

## 2021-02-11 MED ORDER — OXYCODONE HCL 10 MG PO TABS: 10.0000 mg | ORAL_TABLET | ORAL | 0 refills | Status: DC | PRN

## 2021-02-11 MED ORDER — METOPROLOL SUCCINATE ER 25 MG PO TB24: 12.5000 mg | ORAL_TABLET | Freq: Every day | ORAL | Status: DC

## 2021-02-11 MED ORDER — FERROUS SULFATE 325 (65 FE) MG PO TABS: 325.0000 mg | ORAL_TABLET | Freq: Every day | ORAL | 3 refills | Status: DC

## 2021-02-11 MED ORDER — FINASTERIDE 5 MG PO TABS: 5.0000 mg | ORAL_TABLET | Freq: Every day | ORAL | 3 refills | Status: AC

## 2021-02-11 NOTE — Plan of Care (Signed)

## 2021-02-11 NOTE — TOC Transition Note (Signed)
Transition of Care [facility]) - CM/SW Discharge Note   Patient Details  Name: [name] MRN: [id number] Date of Birth: [birth year]  Transition of Care [facility]) CM/SW Contact:  [name], LCSWA Phone Number: 02/11/2021, 11:41 AM   Clinical Narrative:    Patient will DC to: [facility] Anticipated DC date: 02/11/2021 Transport by:  Yes   Per MD patient ready for DC to SNF. RN to call report prior to discharge [phone]. RN, patient, patient's family, and facility notified of DC. Discharge Summary and FL2 sent to facility. DC packet on chart. Ambulance transport will be requested for patient when RN reports that the patient is ready..   CSW will sign off for now as social work intervention is no longer needed. Please consult [place] again if new needs arise.       Barriers to Discharge: SNF Authorization Denied, SNF Pending bed offer   Patient Goals and CMS Choice Patient states their goals for this hospitalization and ongoing recovery are:: To go to rehab and get better so that he can go home to his wife CMS Medicare.gov Compare Post Acute Care list provided to:: Patient Choice offered to / list presented to : Patient  Discharge Placement                       Discharge Plan and Services                                     Social Determinants of Health (SDOH) Interventions     Readmission Risk Interventions No flowsheet data found.

## 2021-02-11 NOTE — Progress Notes (Signed)
Physical Therapy Treatment Patient Details Name: [name] MRN: [id number] DOB: Nov 09, 1945 Today's Date: 02/11/2021   History of Present Illness Pt is a 75 y.o. male admitted 01/24/21 with R calf injury after slamming it in car door. S/p RLE I&D for compartment fasciotomy on 9/28. Course complicated by hematuria from catheter trauma requiring bladder irrigation. S/p clot evacuation 10/5. PMH includes CAD, HTN, CKD 2, HLD, hearing loss, morbid obesity.   PT Comments    Pt progressing with mobility. Today's session focused on transfer and gait training with RW, as well as completing bathroom/toileting tasks. Pt requiring up to modA to stand from low surfaces with RW, currently unable to tolerate ambulating household distances. Pt remains limited by pain, generalized weakness, decreased activity tolerance, and impaired balance strategies/postural reactions. Continue to recommend SNF-level therapies to maximize functional mobility and independence prior to return home.    Recommendations for follow up therapy are one component of a multi-disciplinary discharge planning process, led by the attending physician.  Recommendations may be updated based on patient status, additional functional criteria and insurance authorization.  Follow Up Recommendations  SNF;Supervision for mobility/OOB     Equipment Recommendations  Rolling walker with 5" wheels    Recommendations for Other Services       Precautions / Restrictions Precautions Precautions: Fall Restrictions Weight Bearing Restrictions: Yes RLE Weight Bearing: Weight bearing as tolerated     Mobility  Bed Mobility Overal bed mobility: Needs Assistance Bed Mobility: Supine to Sit     Supine to sit: Supervision;HOB elevated     General bed mobility comments: Increased time and effort with heavy use of bed rail    Transfers Overall transfer level: Needs assistance Equipment used: Rolling walker (2 wheeled) Transfers: Sit  to/from Stand Sit to Stand: Mod assist;Min guard         General transfer comment: Initial modA for HHA to elevate trunk standing from low EOB to RW; additional stand from toilet with min guard, heavy use of grab bar to pull to stand  Ambulation/Gait Ambulation/Gait assistance: Min guard Gait Distance (Feet): 20 Feet Assistive device: Rolling walker (2 wheeled) Gait Pattern/deviations: Step-through pattern;Decreased stride length;Decreased weight shift to right;Antalgic Gait velocity: Decreased   General Gait Details: Slow, antalgic gait with RW and min guard for balance; seated rest to void on toilet; pt endorses fatigue   Stairs             Wheelchair Mobility    Modified Rankin (Stroke Patients Only)       Balance Overall balance assessment: Needs assistance Sitting-balance support: Feet supported Sitting balance-Leahy Scale: Good     Standing balance support: During functional activity;Bilateral upper extremity supported;No upper extremity supported Standing balance-Leahy Scale: Fair Standing balance comment: Can briefly static stand without UE support; reliant on single UE support for toileting task; reliant on BUE support with ambulation                            Cognition Arousal/Alertness: Awake/alert Behavior During Therapy: WFL for tasks assessed/performed Overall Cognitive Status: Within Functional Limits for tasks assessed                                        Exercises      General Comments General comments (skin integrity, edema, etc.): Increased time discussing SNF vs. HHPT services - pt not near  baseline functional mobility also requiring increased assist, pt ultimately reports desire for SNF and hopeful for d/c today      Pertinent Vitals/Pain Pain Assessment: Faces Faces Pain Scale: Hurts a little bit Pain Location: RLE Pain Descriptors / Indicators: Sore Pain Intervention(s): Monitored during session     Home Living                      Prior Function            PT Goals (current goals can now be found in the care plan section) Acute Rehab PT Goals Patient Stated Goal: "I'd happily go to rehab before home" PT Goal Formulation: With patient Time For Goal Achievement: 02/25/21 Potential to Achieve Goals: Good Progress towards PT goals: Progressing toward goals    Frequency    Min 3X/week      PT Plan Current plan remains appropriate    Co-evaluation              AM-PAC PT "6 Clicks" Mobility   Outcome Measure  Help needed turning from your back to your side while in a flat bed without using bedrails?: A Little Help needed moving from lying on your back to sitting on the side of a flat bed without using bedrails?: A Little Help needed moving to and from a bed to a chair (including a wheelchair)?: A Little Help needed standing up from a chair using your arms (e.g., wheelchair or bedside chair)?: A Lot Help needed to walk in hospital room?: A Little Help needed climbing 3-5 steps with a railing? : A Lot 6 Click Score: 16    End of Session Equipment Utilized During Treatment: Gait belt Activity Tolerance: Patient tolerated treatment well Patient left: in chair;with call bell/phone within reach;with nursing/sitter in room Nurse Communication: Mobility status PT Visit Diagnosis: Other abnormalities of gait and mobility (R26.89);Pain Pain - Right/Left: Right     Time: 4503-8882 PT Time Calculation (min) (ACUTE ONLY): 19 min  Charges:  $Therapeutic Activity: 8-22 mins                     Mabeline Caras, PT, DPT Acute Rehabilitation Services  Pager [phone] Office [phone]  Derry Lory 02/11/2021, 11:25 AM

## 2021-02-11 NOTE — Discharge Summary (Signed)
Physician Discharge Summary  [name] UMP:[id number] DOB: Aug 24, 1945 DOA: 01/24/2021  PCP: [name], MD  Admit date: 01/24/2021 Discharge date: 02/11/2021  Time spent: 27 minutes  Recommendations for Outpatient Follow-up:  Needs CBC Chem-12 1 week Weekly dressing changes in office at Dr. [name]  office with orthopedics Will need skilled placement on discharge Giving prescription of oxycodone for discharge  Discharge Diagnoses:  MAIN problem for hospitalization   Compartment syndrome right lower extremity Gross hematuria with retention  Please see below for itemized issues addressed in [place]- refer to other progress notes for clarity if needed  Discharge Condition: Improved  Diet recommendation: Heart healthy  Filed Weights   01/24/21 1229  Weight: 119.7 kg    History of present illness:  75 y.o. male with medical history significant of NSTEMI, CAD status post PCI with stenting 08/2019(on ASA and Brilinta), HTN, HLD, CKD3B, hearing loss, morbid obesity, came with injury on right calf from a car door.  He was found to have right lower extremity compartment syndrome and underwent emergent fasciotomy.  He is status post OR again on 4/00/86 for I&D, application of dermal skin graft and wound VAC placement.  Patient was seen by physical therapy who recommended SNF.  While awaiting SNF placement he developed hematuria with urinary retention, possibly due to traumatic Foley catheter placement.  He was seen by urology and started on CBI.  Home Brilinta was held.  Seen by cardiology, okay to continue to hold off [facility].  Patient is more than a year out of PCI with stent placement.     Hospital course complicated by drop in hemoglobin requiring 1 unit PRBC transfusion for hemoglobin of 7.6 on 02/08/2021.  Transfused due to underlying cardiac disease, with goal hemoglobin greater than 8.0.  Repeat hemoglobin posttransfusion 9.7.   Reported rectal pain with history of  anal fissure and external hemorrhoids.  Anusol ordered.   Foley catheter removed on 02/09/2021 by urology for voiding trial.   Hospitalization also complicated by episodic dark stool which was felt to be nonsevere and likely secondary to hemorrhoidal bleeding  Hospital Course:  Resolved Gross hematuria with acute urinary retention status post cystoscopy and clot evacuation on 02/03/2021 by urology, Dr. [name] Completed CBI. hematuria has resolved. Home Brilinta was held. He is currently on aspirin 81 mg daily, continue. Voiding trial on 02/09/2021, Foley catheter removed by urology.  And can have as needed outpatient follow-up   Improving, AKI on CKD IIIB suspect prerenal in the setting of dehydration Baseline cr. 1.5.  creatinine downtrending 1.4 from 1.92 Continue to avoid nephrotoxic agent, dehydration and hypotension. lisinopril due to AKI. Will need to follow-up with his primary care provider  And have repeat labs and discuss his blood pressure control   Leukocytosis, suspect reactive WBC resolving well no fevers or constitutional symptoms He denies having any respiratory sym Procalcitonin negative 0.11.   Rectal pain, endorses history of rectal fissure and external hemorrhoids, followed at [facility]. Started Anusol twice daily x5 days, continue. Tells me he has had episodic rectal bleeding yesterday 1 episode which could be related to this S2 was seen by myself on day of discharge and noted to be brown he has episodic bleeding which is likely hemorrhoidal and will need outpatient follow-up  Acute blood loss anemia Baseline hemoglobin appears to be 12 Drop in hemoglobin 7.6 K from 8.1 K Transfused 1 unit, repeat hemoglobin posttransfusion 9.7 on 02/09/2021. Iron deficiency on iron studies Ferrous sulfate supplement 325 mg x 30 days  with as needed MiraLAX.   Severe three-vessel coronary artery disease status post PCI with stenting in April 2021. Surgery was consulted during  that admission but felt PCI along with medical management was best option.  resumed metoprolol this admit but at veyr low dose 12.5 XL given low pressure sin the past -s/p PCI to pRCA and pLCX with residual disease.  -continue ASA 81mg  daily, Crestor, he is on 5 mg twice weekly once per day on Monday and Thursday.  He is also on Zetia daily 10 mg. Cardiology consulted to assist with management of his coronary artery disease and to advise on antiplatelets in the setting of his hematuria.  Okay to continue to hold off on Brilinta for now per cardiology who discussed this with prior physician   Right lower extremity compartment syndrome Status post emergent fasciotomy on 9/25 with repeat I&D and skin grafting with wound VAC placement on 9/28.   Management per orthopedic surgery. -Gabapentin 300mg  tid  - PT-recommending SNF.   If all stable in a.m. likely can discharge   Resolved posttreatment: Hyperkalemia in the setting of AKI Resolved--stop ACE--peridoic rechecks   Discharge Exam: Vitals:   02/10/21 2055 02/11/21 0740  BP: (!) 101/55 (!) 129/55  Pulse: 83 69  Resp: 17 17  Temp: 98.4 F (36.9 C) 98 F (36.7 C)  SpO2: 98% 97%    Subj on day of d/c   Awake coherent moving around fair and room Pain manageable no distress no shortness of breath  General Exam on discharge  EOMI NCAT no focal deficit S1-S2 no murmur Chest clear Abdomen soft obese nontender no rebound Rectal deferred ROM intact Neurologically grossly intact moving 4 limbs equally Dressing on right lower extremity  Discharge Instructions   Discharge Instructions     Diet - low sodium heart healthy   Complete by: As directed    Increase activity slowly   Complete by: As directed    No wound care   Complete by: As directed       Allergies as of 02/11/2021       Reactions   Codeine    Bad Headache        Medication List     STOP taking these medications    metoprolol succinate 50 MG 24 hr  tablet Commonly known as: TOPROL-XL   ticagrelor 60 MG Tabs tablet Commonly known as: Brilinta       TAKE these medications    aspirin 81 MG EC tablet Take 1 tablet (81 mg total) by mouth daily.   CO Q 10 PO Take 1 tablet by mouth daily.   ezetimibe 10 MG tablet Commonly known as: ZETIA TAKE 1 TABLET(10 MG) BY MOUTH DAILY   ferrous sulfate 325 (65 FE) MG tablet Take 1 tablet (325 mg total) by mouth daily with breakfast.   finasteride 5 MG tablet Commonly known as: PROSCAR Take 1 tablet (5 mg total) by mouth daily.   Fish Oil 1000 MG Cpdr Take 2,000 mg by mouth daily.   gabapentin 300 MG capsule Commonly known as: NEURONTIN Take 1 capsule (300 mg total) by mouth 3 (three) times daily.   hydrocortisone 2.5 % rectal cream Commonly known as: ANUSOL-HC Place rectally 3 (three) times daily.   lisinopril 5 MG tablet Commonly known as: ZESTRIL Take 1 tablet (5 mg total) by mouth daily.   nitroGLYCERIN 0.4 MG SL tablet Commonly known as: NITROSTAT Place 1 tablet (0.4 mg total) under the tongue every 5 (five) minutes  as needed for chest pain.   Oxycodone HCl 10 MG Tabs Take 1 tablet (10 mg total) by mouth every 4 (four) hours as needed for moderate pain.   polyethylene glycol 17 g packet Commonly known as: MIRALAX / GLYCOLAX Take 17 g by mouth daily as needed for severe constipation.   rosuvastatin 5 MG tablet Commonly known as: CRESTOR Take 1 tablet (5 mg total) by mouth 2 (two) times a week.   senna-docusate 8.6-50 MG tablet Commonly known as: Senokot-S Take 2 tablets by mouth daily at 6 (six) AM.               Durable Medical Equipment  (From admission, onward)           Start     Ordered   02/04/21 1446  For home use only DME Walker  Once       Comments: 5" wheels  Question:  Patient needs a walker to treat with the following condition  Answer:  Ambulatory dysfunction   02/04/21 1446           Allergies  Allergen Reactions   Codeine      Bad Headache    Follow-up Information     [name], MD. Go on 02/05/2021.   Specialty: Orthopedic Surgery Why: 10 AM appt Contact information: [place] [address] [phone]         Richmond Burich, MD. Call in 1 day(s).   Specialty: Gastroenterology Why: please call for a post hospital follow up appointment for your iron deficiency anemia within a week. Contact information: [place] [address] [phone]                  The results of significant diagnostics from this hospitalization (including imaging, microbiology, ancillary and laboratory) are listed below for reference.    Significant Diagnostic Studies: CT HEAD WO CONTRAST (5MM)  Result Date: 01/24/2021 CLINICAL DATA:  Seizure.  Leg injury. EXAM: CT HEAD WITHOUT CONTRAST TECHNIQUE: Contiguous axial images were obtained from the base of the skull through the vertex without intravenous contrast. COMPARISON:  None. FINDINGS: Brain: No evidence of acute infarction, hemorrhage, hydrocephalus, extra-axial collection or mass lesion/mass effect. Ventricular and sulcal enlargement reflecting mild to moderate diffuse atrophy. Patchy areas of white matter hypoattenuation are noted consistent with mild chronic microvascular ischemic change. Vascular: No hyperdense vessel or unexpected calcification. Skull: Normal. Negative for fracture or focal lesion. Sinuses/Orbits: Globes and orbits are unremarkable. Visualized sinuses are clear. Other: None. IMPRESSION: 1. No acute intracranial abnormalities. Electronically Signed   By: Lajean Manes M.D.   On: 01/24/2021 15:18   [place] RT LOWER EXTREM LTD SOFT TISSUE NON VASCULAR  Result Date: 01/24/2021 CLINICAL DATA:  Right leg injury.  Hit car door this morning. EXAM: ULTRASOUND RIGHT LOWER EXTREMITY LIMITED TECHNIQUE: Ultrasound examination of the lower extremity soft tissues was performed in the area of clinical concern.  COMPARISON:  None. FINDINGS: Complex collection in the right posterolateral calf, with no internal blood flow on color Doppler analysis, measuring approximately 16 x 5 x 5 cm, consistent with a hematoma. IMPRESSION: 1. Complex collection in the posterolateral right calf consistent with a hematoma, given the history of trauma. Electronically Signed   By: Lajean Manes M.D.   On: 01/24/2021 14:23   CT EXTREMITY LOWER RIGHT W CONTRAST  Result Date: 01/24/2021 CLINICAL DATA:  Right lower extremity pain and swelling after closed leg in car door. Patient on anticoagulation. EXAM:  CT OF THE LOWER RIGHT EXTREMITY WITH CONTRAST TECHNIQUE: Multidetector CT imaging of the lower right extremity was performed according to the standard protocol following intravenous contrast administration. CONTRAST:  49mL OMNIPAQUE IOHEXOL 350 MG/ML SOLN COMPARISON:  Same-day soft tissue ultrasound FINDINGS: Bones/Joint/Cartilage No acute fracture of the right tibia or fibula. Normal bony alignment without dislocation. No significant arthropathy of the right knee or ankle. No periostitis. No lytic or sclerotic bone lesion. Ligaments Suboptimally assessed by CT. Muscles and Tendons No acute musculotendinous abnormality by CT. No intramuscular fluid collection. The extensor mechanism of the knee is intact. Soft tissues Very large heterogeneously hyperdense fluid collection overlies the posterior compartment of the right calf measuring approximally 26 x 4 x 11 cm. No rim enhancement. There is surrounding stranding and edema within the adjacent soft tissues. Multifocal areas of cutaneous blistering. No soft tissue gas. IMPRESSION: 1. Very large heterogeneously hyperdense fluid collection overlying the posterior compartment of the right calf measuring approximally 26 x 4 x 11 cm compatible with a large hematoma. Multifocal areas of cutaneous blistering, which can be seen in the setting of compartment syndrome. 2. No acute fracture or dislocation  of the right tibia or fibula. These results were called by telephone at the time of interpretation on 01/24/2021 at 3:27 pm to provider MATTHEW TRIFAN , who verbally acknowledged these results. Electronically Signed   By: Davina Poke D.O.   On: 01/24/2021 15:35    Microbiology: Recent Results (from the past 240 hour(s))  Resp Panel by RT-PCR (Flu A&B, Covid) Nasopharyngeal Swab     Status: None   Collection Time: 02/09/21  5:17 PM   Specimen: Nasopharyngeal Swab; Nasopharyngeal(NP) swabs in vial transport medium  Result Value Ref Range Status   SARS Coronavirus 2 by RT PCR NEGATIVE NEGATIVE Final    Comment: (NOTE) SARS-CoV-2 target nucleic acids are NOT DETECTED.  The SARS-CoV-2 RNA is generally detectable in upper respiratory specimens during the acute phase of infection. The lowest concentration of SARS-CoV-2 viral copies this assay can detect is 138 copies/mL. A negative result does not preclude SARS-Cov-2 infection and should not be used as the sole basis for treatment or other patient management decisions. A negative result may occur with  improper specimen collection/handling, submission of specimen other than nasopharyngeal swab, presence of viral mutation(s) within the areas targeted by this assay, and inadequate number of viral copies(<138 copies/mL). A negative result must be combined with clinical observations, patient history, and epidemiological information. The expected result is Negative.  Fact Sheet for Patients:  EntrepreneurPulse.com.au  Fact Sheet for Healthcare Providers:  IncredibleEmployment.be  This test is no t yet approved or cleared by the [place] FDA and  has been authorized for detection and/or diagnosis of SARS-CoV-2 by FDA under an Emergency Use Authorization (EUA). This EUA will remain  in effect (meaning this test can be used) for the duration of the COVID-19 declaration under Section 564(b)(1) of the  Act, 21 U.S.C.section 360bbb-3(b)(1), unless the authorization is terminated  or revoked sooner.       Influenza A by PCR NEGATIVE NEGATIVE Final   Influenza B by PCR NEGATIVE NEGATIVE Final    Comment: (NOTE) The Xpert Xpress SARS-CoV-2/FLU/RSV plus assay is intended as an aid in the diagnosis of influenza from Nasopharyngeal swab specimens and should not be used as a sole basis for treatment. Nasal washings and aspirates are unacceptable for Xpert Xpress SARS-CoV-2/FLU/RSV testing.  Fact Sheet for Patients: EntrepreneurPulse.com.au  Fact Sheet for Healthcare Providers: IncredibleEmployment.be  This test is not yet approved or cleared by the [place] and has been authorized for detection and/or diagnosis of SARS-CoV-2 by FDA under an Emergency Use Authorization (EUA). This EUA will remain in effect (meaning this test can be used) for the duration of the COVID-19 declaration under Section 564(b)(1) of the Act, 21 U.S.C. section 360bbb-3(b)(1), unless the authorization is terminated or revoked.  Performed at [facility], [place] [address], [place], [place] [address]      Labs: Basic Metabolic Panel: Recent Labs  Lab 02/05/21 0156 02/06/21 0108 02/07/21 0311 02/10/21 0813 02/11/21 0328  NA 136 135 134* 134* 133*  K 4.7 5.0 4.7 4.5 5.0  CL 99 102 101 101 102  CO2 28 29 27 26 24   GLUCOSE 119* 120* 112* 104* 106*  BUN 42* 36* 32* 26* 30*  CREATININE 1.92* 1.68* 1.61* 1.29* 1.40*  CALCIUM 8.5* 8.5* 8.4* 8.6* 8.9  MG 2.2 2.1 2.1  --   --    Liver Function Tests: Recent Labs  Lab 02/11/21 0328  AST 22  ALT 41  ALKPHOS 69  BILITOT 0.9  PROT 5.2*  ALBUMIN 2.3*   No results for input(s): LIPASE, AMYLASE in the last 168 hours. No results for input(s): AMMONIA in the last 168 hours. CBC: Recent Labs  Lab 02/07/21 0311 02/08/21 0144 02/09/21 0631 02/10/21 0813 02/11/21 0328  WBC 14.2* 14.4* 13.2* 11.0* 11.1*   NEUTROABS  --   --   --  8.0* 7.5  HGB 8.1* 7.6* 9.7* 10.5* 9.9*  HCT 25.4* 24.1* 30.7* 33.0* 30.5*  MCV 89.1 90.6 89.8 89.7 89.7  PLT 410* 416* 443* 438* 397   Cardiac Enzymes: No results for input(s): CKTOTAL, CKMB, CKMBINDEX, TROPONINI in the last 168 hours. BNP: BNP (last 3 results) No results for input(s): BNP in the last 8760 hours.  ProBNP (last 3 results) No results for input(s): PROBNP in the last 8760 hours.  CBG: No results for input(s): GLUCAP in the last 168 hours.     Signed:  Nita Sells MD   [facility] 02/11/2021, 8:40 AM

## 2021-02-11 NOTE — Progress Notes (Signed)
Pt. IV taken out and belongings placed together, Ready for discharge

## 2021-02-18 ENCOUNTER — Ambulatory Visit (INDEPENDENT_AMBULATORY_CARE_PROVIDER_SITE_OTHER): Payer: Medicare Other | Admitting: Orthopaedic Surgery

## 2021-02-18 ENCOUNTER — Other Ambulatory Visit (HOSPITAL_BASED_OUTPATIENT_CLINIC_OR_DEPARTMENT_OTHER): Payer: Self-pay

## 2021-02-18 ENCOUNTER — Other Ambulatory Visit: Payer: Self-pay

## 2021-02-18 DIAGNOSIS — Z9889 Other specified postprocedural states: Secondary | ICD-10-CM

## 2021-02-18 MED ORDER — CEPHALEXIN 500 MG PO CAPS
500.0000 mg | ORAL_CAPSULE | Freq: Four times a day (QID) | ORAL | 0 refills | Status: DC
  Filled 2021-02-18: qty 28, 7d supply, fill #0

## 2021-02-18 NOTE — Progress Notes (Signed)
Post Operative Evaluation    Procedure/Date of Surgery: 01/27/21, right leg fasciotomy and subsequent wound closure with skin grafting  Interval History:   Presents today for follow-up of the above procedures.  He has been weightbearing on the right leg with a walker.  His dressing has been in place.  He is now back on Brilinta.   PMH/PSH/Family History/Social History/Meds/Allergies:    Past Medical History:  Diagnosis Date   Cancer ([place])    CHF (congestive heart failure) (HCC)    EF 45-50% on 08/2019 echo   Chronic kidney disease    RENAL STONES   Coronary artery disease    3 vessel disease: DES to RCA, DES to LCx 08/2019 and medical management   High frequency hearing loss    History of migraine headaches    Hx of colonic polyps    Hyperlipidemia    Hypertension    Obesity    Pulmonary hypertension ([place])    Seborrheic dermatitis    Vitiligo    Past Surgical History:  Procedure Laterality Date   APPLICATION OF WOUND VAC Right 01/24/2021   Procedure: APPLICATION OF WOUND VAC;  Surgeon: [name], MD;  Location: [facility];  Service: Orthopedics;  Laterality: Right;   COLON SURGERY  01/30/2001   CORONARY STENT INTERVENTION N/A 08/12/2019   Procedure: CORONARY STENT INTERVENTION;  Surgeon: Nelva Bush, MD;  Location: [facility];  Service: Cardiovascular;  Laterality: N/A;   CYSTOSCOPY N/A 02/03/2021   Procedure: CYSTOSCOPY, CLOT EVAUATION;  Surgeon: Raynelle Bring, MD;  Location: [facility];  Service: Urology;  Laterality: N/A;   CYSTOSCOPY/URETEROSCOPY/HOLMIUM LASER/STENT PLACEMENT Left 02/04/2019   Procedure: CYSTOSCOPY Left retrograde and attempted left ureteroscopy;  Surgeon: Raynelle Bring, MD;  [facility];  Service: Urology;  Laterality: Left;  ONLY NEEDS 60 MIN   CYSTOSCOPY/URETEROSCOPY/HOLMIUM LASER/STENT PLACEMENT Left 11/07/2019   Procedure: CYSTOSCOPY/URETEROSCOPY/HOLMIUM LASER/STENT PLACEMENT;  Surgeon: Raynelle Bring, MD;  [facility];  Service: Urology;  Laterality: Left;   FASCIOTOMY Right 01/24/2021   Procedure: FASCIOTOMY RIGHT LEG;  Surgeon: [name], MD;  Location: [place];  Service: Orthopedics;  Laterality: Right;   FRACTURE SURGERY     I & D EXTREMITY Right 01/27/2021   Procedure: IRRIGATION AND DEBRIDEMENT RIGHT LOWER EXTREMITY;  Surgeon: [name], MD;  Location: [facility];  Service: Orthopedics;  Laterality: Right;   INTRAVASCULAR ULTRASOUND/IVUS N/A 08/12/2019   Procedure: Intravascular Ultrasound/IVUS;  Surgeon: Nelva Bush, MD;  Location: [facility];  Service: Cardiovascular;  Laterality: N/A;   IR CONVERT LEFT NEPHROSTOMY TO NEPHROURETERAL CATH  09/17/2019   IR NEPHROSTOMY PLACEMENT LEFT  02/05/2019   IR NEPHROSTOMY PLACEMENT LEFT  08/06/2019   LEFT HEART CATH AND CORONARY ANGIOGRAPHY N/A 08/09/2019   Procedure: LEFT HEART CATH AND CORONARY ANGIOGRAPHY;  Surgeon: Jettie Booze, MD;  Location: [facility];  Service: Cardiovascular;  Laterality: N/A;   SKIN BIOPSY     TONSILLECTOMY AND ADENOIDECTOMY     Social History   Socioeconomic History   Marital status: Married    Spouse name: Not on file   Number of children: Not on file   Years of education: Not on file   Highest education level: Not on file  Occupational History   Not on file  Tobacco Use   Smoking status: Former  Packs/day: 1.00    Years: 40.00    Pack years: 40.00    Types: Cigarettes    Quit date: 82    Years since quitting: 25.8   Smokeless tobacco: Never  Vaping Use   Vaping Use: Never used  Substance and Sexual Activity   Alcohol use: Not Currently   Drug use: Never   Sexual activity: Not on file  Other Topics Concern   Not on file  Social History Narrative   Not on file   Social Determinants of Health   Financial Resource Strain: Not on file  Food Insecurity: Not on file  Transportation Needs: Not on file  Physical Activity: Not on file  Stress: Not  on file  Social Connections: Not on file   No family history on file. Allergies  Allergen Reactions   Codeine     Bad Headache   Current Outpatient Medications  Medication Sig Dispense Refill   aspirin EC 81 MG EC tablet Take 1 tablet (81 mg total) by mouth daily. 30 tablet 11   Coenzyme Q10 (CO Q 10 PO) Take 1 tablet by mouth daily.     ezetimibe (ZETIA) 10 MG tablet TAKE 1 TABLET(10 MG) BY MOUTH DAILY 90 tablet 2   ferrous sulfate 325 (65 FE) MG tablet Take 1 tablet (325 mg total) by mouth daily with breakfast. 30 tablet 3   finasteride (PROSCAR) 5 MG tablet Take 1 tablet (5 mg total) by mouth daily. 30 tablet 3   gabapentin (NEURONTIN) 300 MG capsule Take 1 capsule (300 mg total) by mouth 3 (three) times daily. 15 capsule 0   hydrocortisone (ANUSOL-HC) 2.5 % rectal cream Place rectally 3 (three) times daily. 30 g 0   metoprolol succinate (TOPROL-XL) 25 MG 24 hr tablet Take 0.5 tablets (12.5 mg total) by mouth daily. Take with or immediately following a meal.     nitroGLYCERIN (NITROSTAT) 0.4 MG SL tablet Place 1 tablet (0.4 mg total) under the tongue every 5 (five) minutes as needed for chest pain. 25 tablet 3   Omega-3 Fatty Acids (FISH OIL) 1000 MG CPDR Take 2,000 mg by mouth daily.     oxyCODONE 10 MG TABS Take 1 tablet (10 mg total) by mouth every 4 (four) hours as needed for moderate pain. 30 tablet 0   polyethylene glycol (MIRALAX / GLYCOLAX) 17 g packet Take 17 g by mouth daily as needed for severe constipation. 14 each 0   rosuvastatin (CRESTOR) 5 MG tablet Take 1 tablet (5 mg total) by mouth 2 (two) times a week. 26 tablet 3   senna-docusate (SENOKOT-S) 8.6-50 MG tablet Take 2 tablets by mouth daily at 6 (six) AM. 12 tablet 0   No current facility-administered medications for this visit.   No results found.  Review of Systems:   A ROS was performed including pertinent positives and negatives as documented in the HPI.   Musculoskeletal Exam:    There were no vitals  taken for this visit.  Right leg medial incision is well-healed.  Staples removed.  Lateral skin graft wound is well-healing.  There is some exudate over the wound consistent with ongoing normal healing.  He continues to have significant swelling about the right leg with blisters.  He is able to dorsiflex and plantarflex of the right foot.  Sensation is intact light touch in all distributions of the right foot.  2+ dorsalis pedis pulse  Imaging:    None  I personally reviewed and interpreted the radiographs.  Assessment:   75 year old male with right leg compartment syndrome treated with fasciotomy and subsequent closure with skin grafting.  He continues to have swelling.  I have advised him on icing and elevating significantly.  I will also order Keflex for him as there is some induration about the wounds.  I will see him back in 1 week for weekly wound check  Plan :    -7-day course of Keflex prescribed      I personally saw and evaluated the patient, and participated in the management and treatment plan.  [name], MD Attending Physician, Orthopedic Surgery  This document was dictated using Dragon voice recognition software. A reasonable attempt at proof reading has been made to minimize errors.

## 2021-02-25 ENCOUNTER — Ambulatory Visit (INDEPENDENT_AMBULATORY_CARE_PROVIDER_SITE_OTHER): Payer: Medicare Other | Admitting: Orthopaedic Surgery

## 2021-02-25 ENCOUNTER — Other Ambulatory Visit: Payer: Self-pay

## 2021-02-25 DIAGNOSIS — Z9889 Other specified postprocedural states: Secondary | ICD-10-CM

## 2021-02-25 NOTE — Progress Notes (Signed)
Post Operative Evaluation    Procedure/Date of Surgery: 01/27/21, right leg fasciotomy and subsequent wound closure with skin grafting  Interval History:   Presents today for follow-up of the above procedures.  Initially a 5-day course of Keflex at this time.  This caused a mild rash on his neck.  He states the swelling continues to improve.  He continues to use his right leg as tolerated.  Denies any real pain in the leg.   PMH/PSH/Family History/Social History/Meds/Allergies:    Past Medical History:  Diagnosis Date   Cancer ([place])    CHF (congestive heart failure) (HCC)    EF 45-50% on 08/2019 echo   Chronic kidney disease    RENAL STONES   Coronary artery disease    3 vessel disease: DES to RCA, DES to LCx 08/2019 and medical management   High frequency hearing loss    History of migraine headaches    Hx of colonic polyps    Hyperlipidemia    Hypertension    Obesity    Pulmonary hypertension ([place])    Seborrheic dermatitis    Vitiligo    Past Surgical History:  Procedure Laterality Date   APPLICATION OF WOUND VAC Right 01/24/2021   Procedure: APPLICATION OF WOUND VAC;  Surgeon: [name], MD;  Location: [facility];  Service: Orthopedics;  Laterality: Right;   COLON SURGERY  01/30/2001   CORONARY STENT INTERVENTION N/A 08/12/2019   Procedure: CORONARY STENT INTERVENTION;  Surgeon: Nelva Bush, MD;  Location: [facility];  Service: Cardiovascular;  Laterality: N/A;   CYSTOSCOPY N/A 02/03/2021   Procedure: CYSTOSCOPY, CLOT EVAUATION;  Surgeon: Raynelle Bring, MD;  Location: [facility];  Service: Urology;  Laterality: N/A;   CYSTOSCOPY/URETEROSCOPY/HOLMIUM LASER/STENT PLACEMENT Left 02/04/2019   Procedure: CYSTOSCOPY Left retrograde and attempted left ureteroscopy;  Surgeon: Raynelle Bring, MD;  [facility];  Service: Urology;  Laterality: Left;  ONLY NEEDS 60 MIN   CYSTOSCOPY/URETEROSCOPY/HOLMIUM LASER/STENT PLACEMENT Left  11/07/2019   Procedure: CYSTOSCOPY/URETEROSCOPY/HOLMIUM LASER/STENT PLACEMENT;  Surgeon: Raynelle Bring, MD;  [facility];  Service: Urology;  Laterality: Left;   FASCIOTOMY Right 01/24/2021   Procedure: FASCIOTOMY RIGHT LEG;  Surgeon: [name], MD;  Location: [facility];  Service: Orthopedics;  Laterality: Right;   FRACTURE SURGERY     I & D EXTREMITY Right 01/27/2021   Procedure: IRRIGATION AND DEBRIDEMENT RIGHT LOWER EXTREMITY;  Surgeon: [name], MD;  Location: [facility];  Service: Orthopedics;  Laterality: Right;   INTRAVASCULAR ULTRASOUND/IVUS N/A 08/12/2019   Procedure: Intravascular Ultrasound/IVUS;  Surgeon: Nelva Bush, MD;  Location: [facility];  Service: Cardiovascular;  Laterality: N/A;   IR CONVERT LEFT NEPHROSTOMY TO NEPHROURETERAL CATH  09/17/2019   IR NEPHROSTOMY PLACEMENT LEFT  02/05/2019   IR NEPHROSTOMY PLACEMENT LEFT  08/06/2019   LEFT HEART CATH AND CORONARY ANGIOGRAPHY N/A 08/09/2019   Procedure: LEFT HEART CATH AND CORONARY ANGIOGRAPHY;  Surgeon: Jettie Booze, MD;  Location: [facility];  Service: Cardiovascular;  Laterality: N/A;   SKIN BIOPSY     TONSILLECTOMY AND ADENOIDECTOMY     Social History   Socioeconomic History   Marital status: Married    Spouse name: Not on file   Number of children: Not on file   Years of education: Not on file   Highest education level:  Not on file  Occupational History   Not on file  Tobacco Use   Smoking status: Former    Packs/day: 1.00    Years: 40.00    Pack years: 40.00    Types: Cigarettes    Quit date: 43    Years since quitting: 25.8   Smokeless tobacco: Never  Vaping Use   Vaping Use: Never used  Substance and Sexual Activity   Alcohol use: Not Currently   Drug use: Never   Sexual activity: Not on file  Other Topics Concern   Not on file  Social History Narrative   Not on file   Social Determinants of Health   Financial Resource Strain: Not on file  Food  Insecurity: Not on file  Transportation Needs: Not on file  Physical Activity: Not on file  Stress: Not on file  Social Connections: Not on file   No family history on file. Allergies  Allergen Reactions   Codeine     Bad Headache   Current Outpatient Medications  Medication Sig Dispense Refill   aspirin EC 81 MG EC tablet Take 1 tablet (81 mg total) by mouth daily. 30 tablet 11   cephALEXin (KEFLEX) 500 MG capsule Take 1 capsule (500 mg total) by mouth 4 (four) times daily. 28 capsule 0   Coenzyme Q10 (CO Q 10 PO) Take 1 tablet by mouth daily.     ezetimibe (ZETIA) 10 MG tablet TAKE 1 TABLET(10 MG) BY MOUTH DAILY 90 tablet 2   ferrous sulfate 325 (65 FE) MG tablet Take 1 tablet (325 mg total) by mouth daily with breakfast. 30 tablet 3   finasteride (PROSCAR) 5 MG tablet Take 1 tablet (5 mg total) by mouth daily. 30 tablet 3   gabapentin (NEURONTIN) 300 MG capsule Take 1 capsule (300 mg total) by mouth 3 (three) times daily. 15 capsule 0   hydrocortisone (ANUSOL-HC) 2.5 % rectal cream Place rectally 3 (three) times daily. 30 g 0   metoprolol succinate (TOPROL-XL) 25 MG 24 hr tablet Take 0.5 tablets (12.5 mg total) by mouth daily. Take with or immediately following a meal.     nitroGLYCERIN (NITROSTAT) 0.4 MG SL tablet Place 1 tablet (0.4 mg total) under the tongue every 5 (five) minutes as needed for chest pain. 25 tablet 3   Omega-3 Fatty Acids (FISH OIL) 1000 MG CPDR Take 2,000 mg by mouth daily.     oxyCODONE 10 MG TABS Take 1 tablet (10 mg total) by mouth every 4 (four) hours as needed for moderate pain. 30 tablet 0   polyethylene glycol (MIRALAX / GLYCOLAX) 17 g packet Take 17 g by mouth daily as needed for severe constipation. 14 each 0   rosuvastatin (CRESTOR) 5 MG tablet Take 1 tablet (5 mg total) by mouth 2 (two) times a week. 26 tablet 3   senna-docusate (SENOKOT-S) 8.6-50 MG tablet Take 2 tablets by mouth daily at 6 (six) AM. 12 tablet 0   No current facility-administered  medications for this visit.   No results found.  Review of Systems:   A ROS was performed including pertinent positives and negatives as documented in the HPI.   Musculoskeletal Exam:    There were no vitals taken for this visit.  Right leg medial incision is well-healed.  Staples removed.  Lateral skin graft wound is well-healing.  There is some exudate over the wound consistent with ongoing normal healing.  He continues to have significant swelling about the right leg with blisters.  He is able to dorsiflex and plantarflex of the right foot.  Sensation is intact light touch in all distributions of the right foot.  2+ dorsalis pedis pulse  Imaging:    None  I personally reviewed and interpreted the radiographs.   Assessment:   75 year old male with right leg compartment syndrome treated with fasciotomy and subsequent closure with skin grafting.  He continues to have swelling that is gradually improving.  Advised on continuing to elevate the leg and to pump the leg in order to promote return of swelling.  He may continue to be activity as tolerated.  I have placed waterproof dressing so that he may begin to shower.  He will place a compression Ace wrap or a compression stocking.  Plan :    - continue weekly wound checks      I personally saw and evaluated the patient, and participated in the management and treatment plan.  [name], MD Attending Physician, Orthopedic Surgery  This document was dictated using Dragon voice recognition software. A reasonable attempt at proof reading has been made to minimize errors.

## 2021-03-05 ENCOUNTER — Ambulatory Visit (INDEPENDENT_AMBULATORY_CARE_PROVIDER_SITE_OTHER): Payer: Medicare Other | Admitting: Orthopaedic Surgery

## 2021-03-05 ENCOUNTER — Other Ambulatory Visit: Payer: Self-pay

## 2021-03-05 DIAGNOSIS — Z9889 Other specified postprocedural states: Secondary | ICD-10-CM

## 2021-03-05 NOTE — Progress Notes (Signed)
Post Operative Evaluation    Procedure/Date of Surgery: 01/27/21, right leg fasciotomy and subsequent wound closure with skin grafting  Interval History:   Presents today now over a month from fasciotomy with subsequent fascial closure and skin grafting.  Swelling and wounds are slowly healing.  He does continue to have blistering.   PMH/PSH/Family History/Social History/Meds/Allergies:    Past Medical History:  Diagnosis Date   Cancer ([place])    CHF (congestive heart failure) (HCC)    EF 45-50% on 08/2019 echo   Chronic kidney disease    RENAL STONES   Coronary artery disease    3 vessel disease: DES to RCA, DES to LCx 08/2019 and medical management   High frequency hearing loss    History of migraine headaches    Hx of colonic polyps    Hyperlipidemia    Hypertension    Obesity    Pulmonary hypertension ([place])    Seborrheic dermatitis    Vitiligo    Past Surgical History:  Procedure Laterality Date   APPLICATION OF WOUND VAC Right 01/24/2021   Procedure: APPLICATION OF WOUND VAC;  Surgeon: [name], MD;  Location: [facility];  Service: Orthopedics;  Laterality: Right;   COLON SURGERY  01/30/2001   CORONARY STENT INTERVENTION N/A 08/12/2019   Procedure: CORONARY STENT INTERVENTION;  Surgeon: Nelva Bush, MD;  Location: [facility];  Service: Cardiovascular;  Laterality: N/A;   CYSTOSCOPY N/A 02/03/2021   Procedure: CYSTOSCOPY, CLOT EVAUATION;  Surgeon: Raynelle Bring, MD;  Location: [facility];  Service: Urology;  Laterality: N/A;   CYSTOSCOPY/URETEROSCOPY/HOLMIUM LASER/STENT PLACEMENT Left 02/04/2019   Procedure: CYSTOSCOPY Left retrograde and attempted left ureteroscopy;  Surgeon: Raynelle Bring, MD;  [facility];  Service: Urology;  Laterality: Left;  ONLY NEEDS 60 MIN   CYSTOSCOPY/URETEROSCOPY/HOLMIUM LASER/STENT PLACEMENT Left 11/07/2019   Procedure: CYSTOSCOPY/URETEROSCOPY/HOLMIUM LASER/STENT PLACEMENT;  Surgeon: Raynelle Bring, MD;  [facility];  Service: Urology;  Laterality: Left;   FASCIOTOMY Right 01/24/2021   Procedure: FASCIOTOMY RIGHT LEG;  Surgeon: [name], MD;  Location: [facility];  Service: Orthopedics;  Laterality: Right;   FRACTURE SURGERY     I & D EXTREMITY Right 01/27/2021   Procedure: IRRIGATION AND DEBRIDEMENT RIGHT LOWER EXTREMITY;  Surgeon: [name], MD;  Location: [facility];  Service: Orthopedics;  Laterality: Right;   INTRAVASCULAR ULTRASOUND/IVUS N/A 08/12/2019   Procedure: Intravascular Ultrasound/IVUS;  Surgeon: Nelva Bush, MD;  Location: [facility];  Service: Cardiovascular;  Laterality: N/A;   IR CONVERT LEFT NEPHROSTOMY TO NEPHROURETERAL CATH  09/17/2019   IR NEPHROSTOMY PLACEMENT LEFT  02/05/2019   IR NEPHROSTOMY PLACEMENT LEFT  08/06/2019   LEFT HEART CATH AND CORONARY ANGIOGRAPHY N/A 08/09/2019   Procedure: LEFT HEART CATH AND CORONARY ANGIOGRAPHY;  Surgeon: Jettie Booze, MD;  Location: [facility];  Service: Cardiovascular;  Laterality: N/A;   SKIN BIOPSY     TONSILLECTOMY AND ADENOIDECTOMY     Social History   Socioeconomic History   Marital status: Married    Spouse name: Not on file   Number of children: Not on file   Years of education: Not on file   Highest education level: Not on file  Occupational History   Not on file  Tobacco Use   Smoking status: Former    Packs/day: 1.00  Years: 40.00    Pack years: 40.00    Types: Cigarettes    Quit date: 3    Years since quitting: 25.8   Smokeless tobacco: Never  Vaping Use   Vaping Use: Never used  Substance and Sexual Activity   Alcohol use: Not Currently   Drug use: Never   Sexual activity: Not on file  Other Topics Concern   Not on file  Social History Narrative   Not on file   Social Determinants of Health   Financial Resource Strain: Not on file  Food Insecurity: Not on file  Transportation Needs: Not on file  Physical Activity: Not on file  Stress: Not  on file  Social Connections: Not on file   No family history on file. Allergies  Allergen Reactions   Codeine     Bad Headache   Current Outpatient Medications  Medication Sig Dispense Refill   aspirin EC 81 MG EC tablet Take 1 tablet (81 mg total) by mouth daily. 30 tablet 11   cephALEXin (KEFLEX) 500 MG capsule Take 1 capsule (500 mg total) by mouth 4 (four) times daily. 28 capsule 0   Coenzyme Q10 (CO Q 10 PO) Take 1 tablet by mouth daily.     ezetimibe (ZETIA) 10 MG tablet TAKE 1 TABLET(10 MG) BY MOUTH DAILY 90 tablet 2   ferrous sulfate 325 (65 FE) MG tablet Take 1 tablet (325 mg total) by mouth daily with breakfast. 30 tablet 3   finasteride (PROSCAR) 5 MG tablet Take 1 tablet (5 mg total) by mouth daily. 30 tablet 3   gabapentin (NEURONTIN) 300 MG capsule Take 1 capsule (300 mg total) by mouth 3 (three) times daily. 15 capsule 0   hydrocortisone (ANUSOL-HC) 2.5 % rectal cream Place rectally 3 (three) times daily. 30 g 0   metoprolol succinate (TOPROL-XL) 25 MG 24 hr tablet Take 0.5 tablets (12.5 mg total) by mouth daily. Take with or immediately following a meal.     nitroGLYCERIN (NITROSTAT) 0.4 MG SL tablet Place 1 tablet (0.4 mg total) under the tongue every 5 (five) minutes as needed for chest pain. 25 tablet 3   Omega-3 Fatty Acids (FISH OIL) 1000 MG CPDR Take 2,000 mg by mouth daily.     oxyCODONE 10 MG TABS Take 1 tablet (10 mg total) by mouth every 4 (four) hours as needed for moderate pain. 30 tablet 0   polyethylene glycol (MIRALAX / GLYCOLAX) 17 g packet Take 17 g by mouth daily as needed for severe constipation. 14 each 0   rosuvastatin (CRESTOR) 5 MG tablet Take 1 tablet (5 mg total) by mouth 2 (two) times a week. 26 tablet 3   senna-docusate (SENOKOT-S) 8.6-50 MG tablet Take 2 tablets by mouth daily at 6 (six) AM. 12 tablet 0   No current facility-administered medications for this visit.   No results found.  Review of Systems:   A ROS was performed including  pertinent positives and negatives as documented in the HPI.   Musculoskeletal Exam:    There were no vitals taken for this visit.  Right leg medial incision is well-healed.  Staples removed.  Lateral skin graft wound is well-healing.  There is some exudate over the wound consistent with ongoing normal healing.  He continues to have significant swelling about the right leg with blisters.  He is able to dorsiflex and plantarflex of the right foot.  Sensation is intact light touch in all distributions of the right foot.  2+ dorsalis  pedis pulse  Imaging:    None  I personally reviewed and interpreted the radiographs.   Assessment:   75 year old male with right leg compartment syndrome treated with fasciotomy and subsequent closure with skin grafting.  He continues to heal without evidence of infection.  That being said this is happening somewhat slowly.  Given his cardiac history, I have advised that I would like him to see my colleague Dr. Amil Amen to pursue any type of possible work-up for any type of vascular disease that might be inhibiting his wound healing.  Plan :    -Return in 2 weeks for wound check      I personally saw and evaluated the patient, and participated in the management and treatment plan.  [name], MD Attending Physician, Orthopedic Surgery  This document was dictated using Dragon voice recognition software. A reasonable attempt at proof reading has been made to minimize errors.

## 2021-03-05 NOTE — Addendum Note (Signed)
Addended by: Yevonne Pax on: 03/05/2021 01:34 PM   Modules accepted: Orders

## 2021-03-13 ENCOUNTER — Other Ambulatory Visit: Payer: Self-pay

## 2021-03-13 DIAGNOSIS — I2510 Atherosclerotic heart disease of native coronary artery without angina pectoris: Secondary | ICD-10-CM

## 2021-03-13 DIAGNOSIS — I2583 Coronary atherosclerosis due to lipid rich plaque: Secondary | ICD-10-CM

## 2021-03-19 ENCOUNTER — Encounter (HOSPITAL_BASED_OUTPATIENT_CLINIC_OR_DEPARTMENT_OTHER): Payer: Medicare Other | Admitting: Orthopaedic Surgery

## 2021-03-19 ENCOUNTER — Telehealth (HOSPITAL_BASED_OUTPATIENT_CLINIC_OR_DEPARTMENT_OTHER): Payer: Self-pay | Admitting: Orthopaedic Surgery

## 2021-03-19 NOTE — Telephone Encounter (Signed)
Patient was able to be rescheduled in clinic for following Tuesday

## 2021-03-23 ENCOUNTER — Other Ambulatory Visit: Payer: Self-pay

## 2021-03-23 ENCOUNTER — Ambulatory Visit (INDEPENDENT_AMBULATORY_CARE_PROVIDER_SITE_OTHER): Payer: Medicare Other | Admitting: Orthopaedic Surgery

## 2021-03-23 DIAGNOSIS — Z9889 Other specified postprocedural states: Secondary | ICD-10-CM

## 2021-03-23 MED ORDER — SILVER SULFADIAZINE 1 % EX CREA: 1.0000 "application " | TOPICAL_CREAM | Freq: Every day | CUTANEOUS | 0 refills | Status: DC

## 2021-03-23 NOTE — Progress Notes (Signed)
Post Operative Evaluation    Procedure/Date of Surgery: 01/27/21, right leg fasciotomy and subsequent wound closure with skin grafting  Interval History:  Presents today for repeat wound check.  Overall his swelling is significantly better.  He does have some exudate drainage from the wound site that we cannot close.    PMH/PSH/Family History/Social History/Meds/Allergies:    Past Medical History:  Diagnosis Date   Cancer ([place])    CHF (congestive heart failure) (HCC)    EF 45-50% on 08/2019 echo   Chronic kidney disease    RENAL STONES   Coronary artery disease    3 vessel disease: DES to RCA, DES to LCx 08/2019 and medical management   High frequency hearing loss    History of migraine headaches    Hx of colonic polyps    Hyperlipidemia    Hypertension    Obesity    Pulmonary hypertension ([place])    Seborrheic dermatitis    Vitiligo    Past Surgical History:  Procedure Laterality Date   APPLICATION OF WOUND VAC Right 01/24/2021   Procedure: APPLICATION OF WOUND VAC;  Surgeon: [name], MD;  Location: [place];  Service: Orthopedics;  Laterality: Right;   COLON SURGERY  01/30/2001   CORONARY STENT INTERVENTION N/A 08/12/2019   Procedure: CORONARY STENT INTERVENTION;  Surgeon: Nelva Bush, MD;  Location: [facility];  Service: Cardiovascular;  Laterality: N/A;   CYSTOSCOPY N/A 02/03/2021   Procedure: CYSTOSCOPY, CLOT EVAUATION;  Surgeon: Raynelle Bring, MD;  Location: [place];  Service: Urology;  Laterality: N/A;   CYSTOSCOPY/URETEROSCOPY/HOLMIUM LASER/STENT PLACEMENT Left 02/04/2019   Procedure: CYSTOSCOPY Left retrograde and attempted left ureteroscopy;  Surgeon: Raynelle Bring, MD;  [facility];  Service: Urology;  Laterality: Left;  ONLY NEEDS 60 MIN   CYSTOSCOPY/URETEROSCOPY/HOLMIUM LASER/STENT PLACEMENT Left 11/07/2019   Procedure: CYSTOSCOPY/URETEROSCOPY/HOLMIUM LASER/STENT PLACEMENT;  Surgeon: Raynelle Bring, MD;   [facility];  Service: Urology;  Laterality: Left;   FASCIOTOMY Right 01/24/2021   Procedure: FASCIOTOMY RIGHT LEG;  Surgeon: [name], MD;  Location: [place];  Service: Orthopedics;  Laterality: Right;   FRACTURE SURGERY     I & D EXTREMITY Right 01/27/2021   Procedure: IRRIGATION AND DEBRIDEMENT RIGHT LOWER EXTREMITY;  Surgeon: [name], MD;  Location: [place];  Service: Orthopedics;  Laterality: Right;   INTRAVASCULAR ULTRASOUND/IVUS N/A 08/12/2019   Procedure: Intravascular Ultrasound/IVUS;  Surgeon: Nelva Bush, MD;  Location: [facility];  Service: Cardiovascular;  Laterality: N/A;   IR CONVERT LEFT NEPHROSTOMY TO NEPHROURETERAL CATH  09/17/2019   IR NEPHROSTOMY PLACEMENT LEFT  02/05/2019   IR NEPHROSTOMY PLACEMENT LEFT  08/06/2019   LEFT HEART CATH AND CORONARY ANGIOGRAPHY N/A 08/09/2019   Procedure: LEFT HEART CATH AND CORONARY ANGIOGRAPHY;  Surgeon: Jettie Booze, MD;  Location: [facility];  Service: Cardiovascular;  Laterality: N/A;   SKIN BIOPSY     TONSILLECTOMY AND ADENOIDECTOMY     Social History   Socioeconomic History   Marital status: Married    Spouse name: Not on file   Number of children: Not on file   Years of education: Not on file   Highest education level: Not on file  Occupational History   Not on file  Tobacco Use   Smoking status: Former    Packs/day: 1.00  Years: 40.00    Pack years: 40.00    Types: Cigarettes    Quit date: 79    Years since quitting: 25.9   Smokeless tobacco: Never  Vaping Use   Vaping Use: Never used  Substance and Sexual Activity   Alcohol use: Not Currently   Drug use: Never   Sexual activity: Not on file  Other Topics Concern   Not on file  Social History Narrative   Not on file   Social Determinants of Health   Financial Resource Strain: Not on file  Food Insecurity: Not on file  Transportation Needs: Not on file  Physical Activity: Not on file  Stress: Not on file   Social Connections: Not on file   No family history on file. Allergies  Allergen Reactions   Codeine     Bad Headache   Current Outpatient Medications  Medication Sig Dispense Refill   silver sulfADIAZINE (SILVADENE) 1 % cream Apply 1 application topically daily. 50 g 0   aspirin EC 81 MG EC tablet Take 1 tablet (81 mg total) by mouth daily. 30 tablet 11   cephALEXin (KEFLEX) 500 MG capsule Take 1 capsule (500 mg total) by mouth 4 (four) times daily. 28 capsule 0   Coenzyme Q10 (CO Q 10 PO) Take 1 tablet by mouth daily.     ezetimibe (ZETIA) 10 MG tablet TAKE 1 TABLET(10 MG) BY MOUTH DAILY 90 tablet 2   ferrous sulfate 325 (65 FE) MG tablet Take 1 tablet (325 mg total) by mouth daily with breakfast. 30 tablet 3   finasteride (PROSCAR) 5 MG tablet Take 1 tablet (5 mg total) by mouth daily. 30 tablet 3   gabapentin (NEURONTIN) 300 MG capsule Take 1 capsule (300 mg total) by mouth 3 (three) times daily. 15 capsule 0   hydrocortisone (ANUSOL-HC) 2.5 % rectal cream Place rectally 3 (three) times daily. 30 g 0   metoprolol succinate (TOPROL-XL) 25 MG 24 hr tablet Take 0.5 tablets (12.5 mg total) by mouth daily. Take with or immediately following a meal.     nitroGLYCERIN (NITROSTAT) 0.4 MG SL tablet Place 1 tablet (0.4 mg total) under the tongue every 5 (five) minutes as needed for chest pain. 25 tablet 3   Omega-3 Fatty Acids (FISH OIL) 1000 MG CPDR Take 2,000 mg by mouth daily.     oxyCODONE 10 MG TABS Take 1 tablet (10 mg total) by mouth every 4 (four) hours as needed for moderate pain. 30 tablet 0   polyethylene glycol (MIRALAX / GLYCOLAX) 17 g packet Take 17 g by mouth daily as needed for severe constipation. 14 each 0   rosuvastatin (CRESTOR) 5 MG tablet Take 1 tablet (5 mg total) by mouth 2 (two) times a week. 26 tablet 3   senna-docusate (SENOKOT-S) 8.6-50 MG tablet Take 2 tablets by mouth daily at 6 (six) AM. 12 tablet 0   No current facility-administered medications for this visit.    No results found.  Review of Systems:   A ROS was performed including pertinent positives and negatives as documented in the HPI.   Musculoskeletal Exam:    There were no vitals taken for this visit.  Right leg medial incision is well-healed.  Staples removed.  Lateral skin graft wound is well-healing.  There is some exudate over the wound consistent with ongoing normal healing.  Swelling is dramatically improved.  Sensation intact in all distributions 2+ dorsalis pedis pulse  Imaging:    None  I personally reviewed  and interpreted the radiographs.   Assessment:   75 year old male with right leg compartment syndrome treated with fasciotomy and subsequent closure with skin grafting.  He continues to heal without evidence of infection.  I have ordered Silvadene to his pharmacy so that his daughter can perform daily Silvadene dressing changes.  I will see him back in 1 month for wound check  Plan :    -Return in 1 month for wound check      I personally saw and evaluated the patient, and participated in the management and treatment plan.  [name], MD Attending Physician, Orthopedic Surgery  This document was dictated using Dragon voice recognition software. A reasonable attempt at proof reading has been made to minimize errors.

## 2021-04-06 NOTE — Progress Notes (Signed)
Office Note      Requesting Provider:  [name] MD  HPI: [name] is a 75 y.o. ([birth year]) male presenting at the request of Dr. [name] for slow wound healing.  Pt was on dual antiplatelet therapy and injured his calf from a car door. This resulted in 4 compartment fasciotomy 01/27/21, with subsequent skin grafting.   On exam today, [name] was doing well.  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  Clearance is independent, and moderately ambulatory.  This is limited due to his size.  He denies claudication, rest pain, tissue loss at the feet.  He feels as though his right fasciotomy wound is healing slowly and is performing daily wound care.  The pt is  on a statin for cholesterol management.  The pt is  on a daily aspirin.   Other AC:  - The pt is  on medication for hypertension.   The pt is not diabetic.  Tobacco hx:  former  Past Medical History:  Diagnosis Date   Cancer ([place])    CHF (congestive heart failure) (HCC)    EF 45-50% on 08/2019 echo   Chronic kidney disease    RENAL STONES   Coronary artery disease    3 vessel disease: DES to RCA, DES to LCx 08/2019 and medical management   High frequency hearing loss    History of migraine headaches    Hx of colonic polyps    Hyperlipidemia    Hypertension    Obesity    Pulmonary hypertension ([place])    Seborrheic dermatitis    Vitiligo     Past Surgical History:  Procedure Laterality Date   APPLICATION OF WOUND VAC Right 01/24/2021   Procedure: APPLICATION OF WOUND VAC;  Surgeon: [name], MD;  Location: [place];  Service: Orthopedics;  Laterality: Right;   COLON SURGERY  01/30/2001   CORONARY STENT INTERVENTION N/A 08/12/2019   Procedure: CORONARY STENT INTERVENTION;  Surgeon: Nelva Bush, MD;  Location: [facility];  Service: Cardiovascular;  Laterality: N/A;    CYSTOSCOPY N/A 02/03/2021   Procedure: CYSTOSCOPY, CLOT EVAUATION;  Surgeon: Raynelle Bring, MD;  Location: [facility];  Service: Urology;  Laterality: N/A;   CYSTOSCOPY/URETEROSCOPY/HOLMIUM LASER/STENT PLACEMENT Left 02/04/2019   Procedure: CYSTOSCOPY Left retrograde and attempted left ureteroscopy;  Surgeon: Raynelle Bring, MD;  [facility];  Service: Urology;  Laterality: Left;  ONLY NEEDS 60 MIN   CYSTOSCOPY/URETEROSCOPY/HOLMIUM LASER/STENT PLACEMENT Left 11/07/2019   Procedure: CYSTOSCOPY/URETEROSCOPY/HOLMIUM LASER/STENT PLACEMENT;  Surgeon: Raynelle Bring, MD;  [facility];  Service: Urology;  Laterality: Left;   FASCIOTOMY Right 01/24/2021   Procedure: FASCIOTOMY RIGHT LEG;  Surgeon: [name], MD;  Location: [facility];  Service: Orthopedics;  Laterality: Right;   FRACTURE SURGERY     I & D EXTREMITY Right 01/27/2021   Procedure: IRRIGATION AND DEBRIDEMENT RIGHT LOWER EXTREMITY;  Surgeon: [name], MD;  Location: [facility];  Service: Orthopedics;  Laterality: Right;   INTRAVASCULAR ULTRASOUND/IVUS N/A 08/12/2019   Procedure: Intravascular Ultrasound/IVUS;  Surgeon: Nelva Bush, MD;  Location: [facility];  Service: Cardiovascular;  Laterality: N/A;   IR CONVERT LEFT NEPHROSTOMY TO NEPHROURETERAL CATH  09/17/2019   IR NEPHROSTOMY PLACEMENT LEFT  02/05/2019   IR NEPHROSTOMY PLACEMENT LEFT  08/06/2019   LEFT HEART CATH AND CORONARY ANGIOGRAPHY N/A 08/09/2019   Procedure: LEFT HEART CATH AND  CORONARY ANGIOGRAPHY;  Surgeon: Jettie Booze, MD;  Location: [facility];  Service: Cardiovascular;  Laterality: N/A;   SKIN BIOPSY     TONSILLECTOMY AND ADENOIDECTOMY      Social History   Socioeconomic History   Marital status: Married    Spouse name: Not on file   Number of children: Not on file   Years of education: Not on file   Highest education level: Not on file  Occupational History   Not on file  Tobacco Use   Smoking status: Former    Packs/day:  1.00    Years: 40.00    Pack years: 40.00    Types: Cigarettes    Quit date: 100    Years since quitting: 25.9   Smokeless tobacco: Never  Vaping Use   Vaping Use: Never used  Substance and Sexual Activity   Alcohol use: Not Currently   Drug use: Never   Sexual activity: Not on file  Other Topics Concern   Not on file  Social History Narrative   Not on file   Social Determinants of Health   Financial Resource Strain: Not on file  Food Insecurity: Not on file  Transportation Needs: Not on file  Physical Activity: Not on file  Stress: Not on file  Social Connections: Not on file  Intimate Partner Violence: Not on file   No family history on file.  Current Outpatient Medications  Medication Sig Dispense Refill   aspirin EC 81 MG EC tablet Take 1 tablet (81 mg total) by mouth daily. 30 tablet 11   cephALEXin (KEFLEX) 500 MG capsule Take 1 capsule (500 mg total) by mouth 4 (four) times daily. 28 capsule 0   Coenzyme Q10 (CO Q 10 PO) Take 1 tablet by mouth daily.     ezetimibe (ZETIA) 10 MG tablet TAKE 1 TABLET(10 MG) BY MOUTH DAILY 90 tablet 2   ferrous sulfate 325 (65 FE) MG tablet Take 1 tablet (325 mg total) by mouth daily with breakfast. 30 tablet 3   finasteride (PROSCAR) 5 MG tablet Take 1 tablet (5 mg total) by mouth daily. 30 tablet 3   gabapentin (NEURONTIN) 300 MG capsule Take 1 capsule (300 mg total) by mouth 3 (three) times daily. 15 capsule 0   hydrocortisone (ANUSOL-HC) 2.5 % rectal cream Place rectally 3 (three) times daily. 30 g 0   metoprolol succinate (TOPROL-XL) 25 MG 24 hr tablet Take 0.5 tablets (12.5 mg total) by mouth daily. Take with or immediately following a meal.     nitroGLYCERIN (NITROSTAT) 0.4 MG SL tablet Place 1 tablet (0.4 mg total) under the tongue every 5 (five) minutes as needed for chest pain. 25 tablet 3   Omega-3 Fatty Acids (FISH OIL) 1000 MG CPDR Take 2,000 mg by mouth daily.     oxyCODONE 10 MG TABS Take 1 tablet (10 mg total) by mouth  every 4 (four) hours as needed for moderate pain. 30 tablet 0   polyethylene glycol (MIRALAX / GLYCOLAX) 17 g packet Take 17 g by mouth daily as needed for severe constipation. 14 each 0   rosuvastatin (CRESTOR) 5 MG tablet Take 1 tablet (5 mg total) by mouth 2 (two) times a week. 26 tablet 3   senna-docusate (SENOKOT-S) 8.6-50 MG tablet Take 2 tablets by mouth daily at 6 (six) AM. 12 tablet 0   silver sulfADIAZINE (SILVADENE) 1 % cream Apply 1 application topically daily. 50 g 0   No current facility-administered medications for this  visit.    Allergies  Allergen Reactions   Codeine     Bad Headache     REVIEW OF SYSTEMS:   [X]  denotes positive finding, [ ]  denotes negative finding Cardiac  Comments:  Chest pain or chest pressure:    Shortness of breath upon exertion:    Short of breath when lying flat:    Irregular heart rhythm:        Vascular    Pain in calf, thigh, or hip brought on by ambulation:    Pain in feet at night that wakes you up from your sleep:     Blood clot in your veins:    Leg swelling:         Pulmonary    Oxygen at home:    Productive cough:     Wheezing:         Neurologic    Sudden weakness in arms or legs:     Sudden numbness in arms or legs:     Sudden onset of difficulty speaking or slurred speech:    Temporary loss of vision in one eye:     Problems with dizziness:         Gastrointestinal    Blood in stool:     Vomited blood:         Genitourinary    Burning when urinating:     Blood in urine:        Psychiatric    Major depression:         Hematologic    Bleeding problems:    Problems with blood clotting too easily:        Skin    Rashes or ulcers:        Constitutional    Fever or chills:      PHYSICAL EXAMINATION:  There were no vitals filed for this visit.  General:  WDWN in NAD; vital signs documented above Gait: Not observed HENT: WNL, normocephalic Pulmonary: normal non-labored breathing , without  wheezing Cardiac: regular HR, Abdomen: soft, NT, no masses Skin: without rashes Vascular Exam/Pulses:  Right Left  Radial 2+ (normal) 2+ (normal)  Ulnar 2+ (normal) 2+ (normal)          DP 2+ (normal) 2+ (normal)       Extremities: without ischemic changes, without Gangrene , without cellulitis; The right-sided fasciotomy wound is closed with granulation tissue rising out of the wound bed. Musculoskeletal: no muscle wasting or atrophy  Neurologic: A&O X 3;  No focal weakness or paresthesias are detected Psychiatric:  The pt has Normal affect.   Non-Invasive Vascular Imaging:       ASSESSMENT/PLAN: [name] is a 75 y.o. male presenting for evaluation of slowly healing right leg fasciotomy wound.  This is healed with granulation tissue rising out of the wound bed.  On examination, there were 3 staples still present, I remove these in the office today.  The patient's ABI was independently reviewed demonstrating normal arterial blood flow in bilateral feet.  He does have signs of mild venous insufficiency bilaterally.  With history of right-sided 4 compartment fasciotomy, this is magnified in the right leg. 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  He would benefit from compression stockings on a daily basis and exercise.  Compression stockings should not inhibit his wound healing.  Should he become  symptomatic with new onset leg heaviness by days end, or develop new wounds on his lower legs, I would like to see him back in the office for venous insufficiency work-up.   [name] John, MD [facility] [phone]

## 2021-04-09 ENCOUNTER — Ambulatory Visit: Payer: Medicare Other | Admitting: Vascular Surgery

## 2021-04-09 ENCOUNTER — Encounter: Payer: Self-pay | Admitting: Vascular Surgery

## 2021-04-09 ENCOUNTER — Ambulatory Visit (HOSPITAL_COMMUNITY)
Admission: RE | Admit: 2021-04-09 | Discharge: 2021-04-09 | Disposition: A | Discharge status: Home / Self Care | Payer: Medicare Other | Source: Ambulatory Visit | Attending: Vascular Surgery | Admitting: Vascular Surgery

## 2021-04-09 ENCOUNTER — Other Ambulatory Visit: Payer: Self-pay

## 2021-04-09 VITALS — BP 151/86 | HR 76 | Resp 20 | Ht 68.0 in | Wt 264.0 lb

## 2021-04-09 DIAGNOSIS — I2583 Coronary atherosclerosis due to lipid rich plaque: Secondary | ICD-10-CM | POA: Diagnosis present

## 2021-04-09 DIAGNOSIS — I2510 Atherosclerotic heart disease of native coronary artery without angina pectoris: Secondary | ICD-10-CM | POA: Insufficient documentation

## 2021-04-09 DIAGNOSIS — Z9889 Other specified postprocedural states: Secondary | ICD-10-CM

## 2021-04-09 DIAGNOSIS — M7989 Other specified soft tissue disorders: Secondary | ICD-10-CM

## 2021-04-27 ENCOUNTER — Other Ambulatory Visit: Payer: Self-pay

## 2021-04-27 ENCOUNTER — Ambulatory Visit (INDEPENDENT_AMBULATORY_CARE_PROVIDER_SITE_OTHER): Payer: Medicare Other | Admitting: Orthopaedic Surgery

## 2021-04-27 DIAGNOSIS — Z9889 Other specified postprocedural states: Secondary | ICD-10-CM

## 2021-04-27 NOTE — Progress Notes (Signed)
Post Operative Evaluation    Procedure/Date of Surgery: 01/27/21, right leg fasciotomy and subsequent wound closure with skin grafting  Interval History:  Presents today for repeat wound check.  Overall his swelling is significantly better.  Wounds are healed at this time.  He occasionally notes a sharp shooting pain around the incisions which will wake him up at night.    PMH/PSH/Family History/Social History/Meds/Allergies:    Past Medical History:  Diagnosis Date   Cancer ([place])    CHF (congestive heart failure) (HCC)    EF 45-50% on 08/2019 echo   Chronic kidney disease    RENAL STONES   Coronary artery disease    3 vessel disease: DES to RCA, DES to LCx 08/2019 and medical management   High frequency hearing loss    History of migraine headaches    Hx of colonic polyps    Hyperlipidemia    Hypertension    Obesity    Pulmonary hypertension ([place])    Seborrheic dermatitis    Vitiligo    Past Surgical History:  Procedure Laterality Date   APPLICATION OF WOUND VAC Right 01/24/2021   Procedure: APPLICATION OF WOUND VAC;  Surgeon: [name], MD;  Location: [place];  Service: Orthopedics;  Laterality: Right;   COLON SURGERY  01/30/2001   CORONARY STENT INTERVENTION N/A 08/12/2019   Procedure: CORONARY STENT INTERVENTION;  Surgeon: Nelva Bush, MD;  Location: [facility];  Service: Cardiovascular;  Laterality: N/A;   CYSTOSCOPY N/A 02/03/2021   Procedure: CYSTOSCOPY, CLOT EVAUATION;  Surgeon: Raynelle Bring, MD;  Location: [place];  Service: Urology;  Laterality: N/A;   CYSTOSCOPY/URETEROSCOPY/HOLMIUM LASER/STENT PLACEMENT Left 02/04/2019   Procedure: CYSTOSCOPY Left retrograde and attempted left ureteroscopy;  Surgeon: Raynelle Bring, MD;  [facility];  Service: Urology;  Laterality: Left;  ONLY NEEDS 60 MIN   CYSTOSCOPY/URETEROSCOPY/HOLMIUM LASER/STENT PLACEMENT Left 11/07/2019   Procedure: CYSTOSCOPY/URETEROSCOPY/HOLMIUM  LASER/STENT PLACEMENT;  Surgeon: Raynelle Bring, MD;  [facility];  Service: Urology;  Laterality: Left;   FASCIOTOMY Right 01/24/2021   Procedure: FASCIOTOMY RIGHT LEG;  Surgeon: [name], MD;  Location: [place];  Service: Orthopedics;  Laterality: Right;   FRACTURE SURGERY     I & D EXTREMITY Right 01/27/2021   Procedure: IRRIGATION AND DEBRIDEMENT RIGHT LOWER EXTREMITY;  Surgeon: [name], MD;  Location: [place];  Service: Orthopedics;  Laterality: Right;   INTRAVASCULAR ULTRASOUND/IVUS N/A 08/12/2019   Procedure: Intravascular Ultrasound/IVUS;  Surgeon: Nelva Bush, MD;  Location: [facility];  Service: Cardiovascular;  Laterality: N/A;   IR CONVERT LEFT NEPHROSTOMY TO NEPHROURETERAL CATH  09/17/2019   IR NEPHROSTOMY PLACEMENT LEFT  02/05/2019   IR NEPHROSTOMY PLACEMENT LEFT  08/06/2019   LEFT HEART CATH AND CORONARY ANGIOGRAPHY N/A 08/09/2019   Procedure: LEFT HEART CATH AND CORONARY ANGIOGRAPHY;  Surgeon: Jettie Booze, MD;  Location: [facility];  Service: Cardiovascular;  Laterality: N/A;   SKIN BIOPSY     TONSILLECTOMY AND ADENOIDECTOMY     Social History   Socioeconomic History   Marital status: Married    Spouse name: Not on file   Number of children: Not on file   Years of education: Not on file   Highest education level: Not on file  Occupational History   Not on file  Tobacco Use  Smoking status: Former    Packs/day: 1.00    Years: 40.00    Pack years: 40.00    Types: Cigarettes    Quit date: 1997    Years since quitting: 26.0   Smokeless tobacco: Never  Vaping Use   Vaping Use: Never used  Substance and Sexual Activity   Alcohol use: Not Currently   Drug use: Never   Sexual activity: Not on file  Other Topics Concern   Not on file  Social History Narrative   Not on file   Social Determinants of Health   Financial Resource Strain: Not on file  Food Insecurity: Not on file  Transportation Needs: Not on file   Physical Activity: Not on file  Stress: Not on file  Social Connections: Not on file   No family history on file. Allergies  Allergen Reactions   Codeine     Bad Headache   Keflex [Cephalexin] Rash   Current Outpatient Medications  Medication Sig Dispense Refill   aspirin EC 81 MG EC tablet Take 1 tablet (81 mg total) by mouth daily. 30 tablet 11   Coenzyme Q10 (CO Q 10 PO) Take 1 tablet by mouth daily.     ezetimibe (ZETIA) 10 MG tablet TAKE 1 TABLET(10 MG) BY MOUTH DAILY 90 tablet 2   finasteride (PROSCAR) 5 MG tablet Take 1 tablet (5 mg total) by mouth daily. (Patient not taking: Reported on 04/09/2021) 30 tablet 3   gabapentin (NEURONTIN) 300 MG capsule Take 1 capsule (300 mg total) by mouth 3 (three) times daily. (Patient not taking: Reported on 04/09/2021) 15 capsule 0   hydrocortisone (ANUSOL-HC) 2.5 % rectal cream Place rectally 3 (three) times daily. 30 g 0   metoprolol succinate (TOPROL-XL) 25 MG 24 hr tablet Take 0.5 tablets (12.5 mg total) by mouth daily. Take with or immediately following a meal. (Patient not taking: Reported on 04/09/2021)     nitroGLYCERIN (NITROSTAT) 0.4 MG SL tablet Place 1 tablet (0.4 mg total) under the tongue every 5 (five) minutes as needed for chest pain. 25 tablet 3   Omega-3 Fatty Acids (FISH OIL) 1000 MG CPDR Take 2,000 mg by mouth daily.     rosuvastatin (CRESTOR) 5 MG tablet Take 1 tablet (5 mg total) by mouth 2 (two) times a week. 26 tablet 3   senna-docusate (SENOKOT-S) 8.6-50 MG tablet Take 2 tablets by mouth daily at 6 (six) AM. 12 tablet 0   silver sulfADIAZINE (SILVADENE) 1 % cream Apply 1 application topically daily. 50 g 0   No current facility-administered medications for this visit.   No results found.  Review of Systems:   A ROS was performed including pertinent positives and negatives as documented in the HPI.   Musculoskeletal Exam:    There were no vitals taken for this visit.  Right leg medial incision is well-healed.   Skin graft incision is healed.  There is some exudate over the wound consistent with ongoing normal healing.  Exudate has resolved sensation intact in all distributions 2+ dorsalis pedis pulse  Imaging:    None  I personally reviewed and interpreted the radiographs.   Assessment:   75 year old male with right leg compartment syndrome treated with fasciotomy and subsequent closure with skin grafting.  He continues to heal without evidence of infection.  Overall he may be activity as tolerated at this time.  Incisions are well-healed.  He will follow-up as needed Plan :    Return to clinic as needed  I personally saw and evaluated the patient, and participated in the management and treatment plan.  [name], MD Attending Physician, Orthopedic Surgery  This document was dictated using Dragon voice recognition software. A reasonable attempt at proof reading has been made to minimize errors.

## 2021-05-02 ENCOUNTER — Other Ambulatory Visit: Payer: Self-pay | Admitting: Cardiology

## 2021-05-27 ENCOUNTER — Encounter: Payer: Self-pay | Admitting: Cardiology

## 2021-07-06 IMAGING — XA IR NEPHROSTOMY PLACEMENT LEFT
1 series · 2 of 2 positions shown · non-contrast
Comparison: CT abdomen and pelvis - earlier same day

INDICATION: History of left-sided nephrolithiasis post attempted left-sided
ureteroscopic stone retraction however this ultimately proved
unsuccessful secondary to patient's markedly enlarged prostate
gland, now with concern for ureteral injury.

[Series 1: run · 0.24mm/px · 2 of 2 slices shown]
[im 1/2]
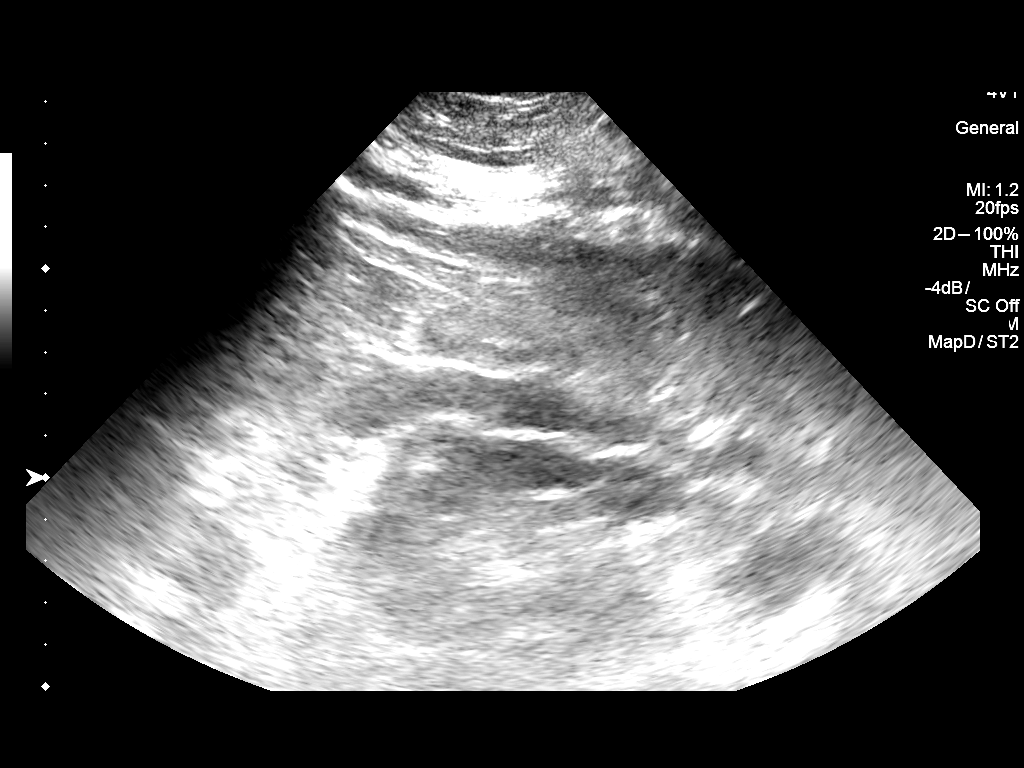
[im 2/2]
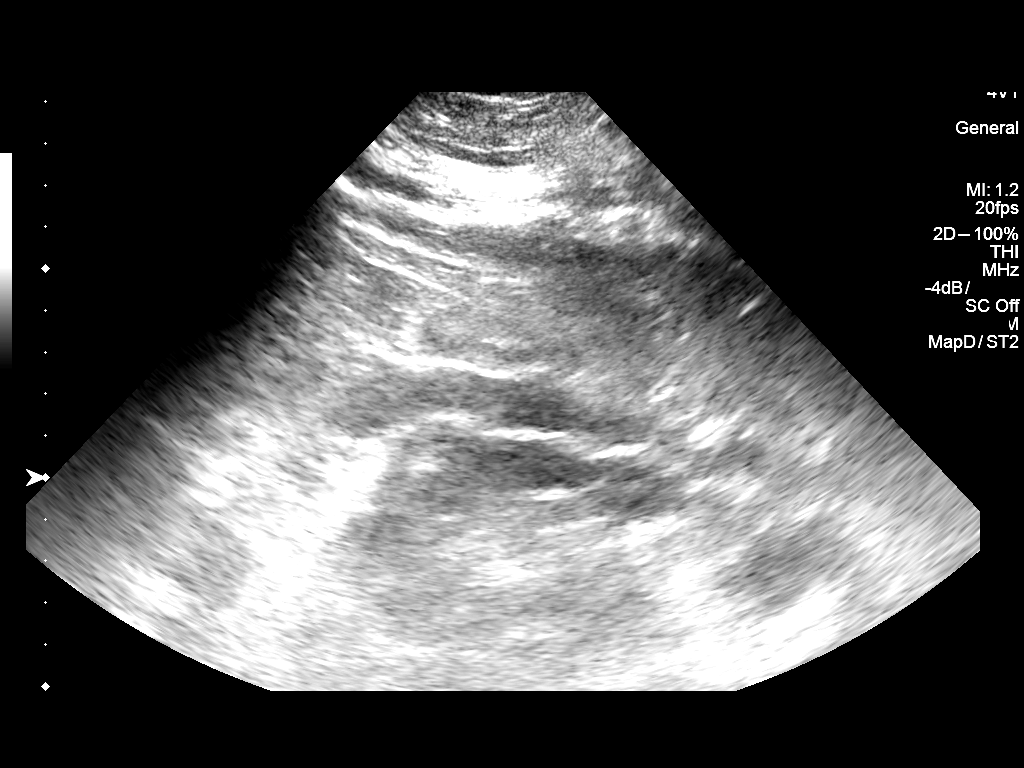

[2 of 2 positions shown; findings below may reference images not displayed]

As such, request made for placement of a left-sided percutaneous
nephrostomy catheter for the purposes of both urinary diversion as
well as to potentially provide a percutaneous access for potential
future percutaneous nephrolithotomy.

EXAM:
ATTEMPTED FLUOROSCOPIC GUIDED PLACEMENT LEFT-SIDED PERCUTANEOUS
NEPHROSTOMY CATHETER.
MEDICATIONS:
The patient is currently admitted to the hospital receiving
intravenous antibiotics; the antibiotic was administered in an
appropriate time frame prior to skin puncture.

ANESTHESIA/SEDATION:
Moderate (conscious) sedation was employed during this procedure. A
total of Versed 2 mg and Fentanyl 100 mcg was administered
intravenously.

Moderate Sedation Time: 50 minutes. The patient's level of
consciousness and vital signs were monitored continuously by
radiology nursing throughout the procedure under my direct
supervision.

CONTRAST:  100 cc Omnipaque 300 was administered intravenously.

Approximately 50 cc of Omnipaque 300 was administered during
attempted fluoroscopic guided cannulation of the left renal
collecting system.

FLUOROSCOPY TIME:  20 minutes, 6 seconds (1,642 mGy).

COMPLICATIONS:
None immediate.

PROCEDURE:
The procedure, risks, benefits, and alternatives were explained to
the patient. Questions regarding the procedure were encouraged and
answered. The patient understands and consents to the procedure. A
timeout was performed prior to the initiation of the procedure.

The left flank region was prepped with Betadine in a sterile
fashion, and a sterile drape was applied covering the operative
field. A sterile gown and sterile gloves were used for the
procedure.

Sonographic evaluation was performed of the left flank however
demonstrated no significant left-sided pelvicaliectasis.
Additionally, note was made of a great depth of the kidney to the
skin surface of at least 10 cm.

Preprocedural spot fluoroscopic image was obtained of the left flank
demonstrating ill-defined opacities overlying the superior and mid
pole the left kidney as well as an ill-defined opacity overlying
expected location of the left UVJ compatible with known left-sided
nephrolithiasis

Contrast was administered peripherally demonstrating faint
opacification the left renal collecting system and again without any
significant pelvicaliectasis.

Prolonged efforts were made to cannulate a non dilated
posteroinferior calyx under fluoroscopic guided however this
ultimately proved unsuccessful

As such, the left renal pelvis was ultimately targeted with a 22
gauge needle. Contrast injection ultimately confirmed intrapelvic
puncture. A Nitrex wire was cannulated within the left renal pelvis
allowing placement of a the 3 French inner catheter from the
Accustick set. This allowed for further opacification of the left
renal collecting system however note was made of extravasation about
the pelvic cannulation site.

Next, prolonged efforts were made to cannulate a non dilated
posteroinferior calyx and while successful cannulation with a Nitrex
wire was achieved, the Accustick set could not be advanced into the
nondilated collecting system likely attributable to patient body
habitus and depth of the kidney.

At this point, the initial access to the left renal pelvis was
inadvertently withdrawn outside the pelvis with a large amount of
extravasation about the left renal pelvis. At this 0.2 incision was
made to abandon additional percutaneous access at this time.
FINDINGS: Ultrasound scanning demonstrated no significant left-sided
pelvicaliectasis as well as an extreme depth of the left kidney in
relation to the skin surface.

As above, intravenous contrast was administered however there is no
significant left-sided pelvicaliectasis

Despite cannulation of left renal pelvis to allow for further
contrast opacification of the collecting system, a peripheral calyx
could not be successfully cannulated and ultimately procedure was
aborted secondary to contrast extravasation about the left renal
pelvis due to inadvertent retraction of the pelvic catheter due to
patient body habitus.

Completion image demonstrates potential migration of the left
ureteral stone to the level of the mid/distal left ureter.
IMPRESSION: Attempted though ultimately unsuccessful left-sided percutaneous
nephrostomy catheter placement secondary to lack of dilatation of
left renal collecting system and patient's extreme obesity.

Above findings discussed with referring urologist, Dr. Awa, at
the time of procedure completion.

## 2021-12-14 ENCOUNTER — Ambulatory Visit: Payer: Medicare Other | Admitting: Cardiology

## 2021-12-15 NOTE — Progress Notes (Unsigned)
Cardiology Office Note:    Date:  12/16/2021   ID:  [name], DOB Dec 09, 1945, MRN [id number]  PCP:  [name], MD   [facility] Providers Cardiologist:  Fransico Him, MD     Referring MD: [name], MD   Chief Complaint: annual follow-up of CAD  History of Present Illness:    [name] is a pleasant 76 y.o. male with a hx of CKD, morbid obesity, ischemic cardiomyopathy with EF 45 to 50% with normalization of EF 03/2020 at 55-60%, and ASCAD s/p PCI to pRCA and pLCx, and kidney infections.   April 2021 admitted with urosepsis due to obstructing renal calculus and developed hypoxic respiratory failure with elevated hs troponin.  Cardiac catheterization revealed severe three-vessel CAD and surgery was consulted.  It was felt PCI along with medical management was best option.  He underwent PCI with DES to pRCA and pLCx and was discharged on DAPT with aspirin and Brilinta.   Seen in May 2021 by Dr. Radford Pax. Noted to have moderate PHTN with PASP 55 mmHg on echo.  VQ scan showed no PE and PFTs were ordered but not done.  He underwent sleep study in August 2021 showing mild OSA with an AHI of 12.3/hr and O2 sats as low as 88%.  He was placed on auto CPAP.  He was last seen in our office on 08/07/2020 by Dr. Radford Pax at which time he reported he never got CPAP set up due to his wife being sick. He was to follow-up with Dr. Radford Pax for sleep medicine 6-8 weeks after. PFT was reordered. Advised to return in 1 year for general cardiology follow-up.  Had compartment syndrome from a car door hitting his leg. Was hospitalized 9/25-10/13/22.   Today, he is here alone for follow-up.  Reports DOE, only able to walk a short distance without stopping to rest. He denies chest pain or palpitations. Occasional lightheadedness.  No presyncope, syncope.  Thinks lightheadedness caused by low BP so he discontinued a BP medication but cannot remember the name. Home BP 128-140/68-88, states  highest is 140 but most often BP is lower. Is followed by the [facility], does not see a pulmonologist. Is not currently wearing CPAP, says he is not willing to wear a mask. Labs from [place] in March 2023 reviewed, LDL 69. Taking 1/2 of Crestor 5 mg twice weekly. No bleeding concerns.    Past Medical History:  Diagnosis Date   Cancer [facility])    CHF (congestive heart failure) (HCC)    EF 45-50% on 08/2019 echo   Chronic kidney disease    RENAL STONES   Coronary artery disease    3 vessel disease: DES to RCA, DES to LCx 08/2019 and medical management   High frequency hearing loss    History of migraine headaches    Hx of colonic polyps    Hyperlipidemia    Hypertension    Obesity    Pulmonary hypertension ([place])    Seborrheic dermatitis    Vitiligo     Past Surgical History:  Procedure Laterality Date   APPLICATION OF WOUND VAC Right 01/24/2021   Procedure: APPLICATION OF WOUND VAC;  Surgeon: [name], MD;  Location: [place];  Service: Orthopedics;  Laterality: Right;   COLON SURGERY  01/30/2001   CORONARY STENT INTERVENTION N/A 08/12/2019   Procedure: CORONARY STENT INTERVENTION;  Surgeon: Nelva Bush, MD;  Location: [place] [facility];  Service: Cardiovascular;  Laterality: N/A;   CYSTOSCOPY N/A 02/03/2021  Procedure: CYSTOSCOPY, CLOT EVAUATION;  Surgeon: Raynelle Bring, MD;  Location: [place];  Service: Urology;  Laterality: N/A;   CYSTOSCOPY/URETEROSCOPY/HOLMIUM LASER/STENT PLACEMENT Left 02/04/2019   Procedure: CYSTOSCOPY Left retrograde and attempted left ureteroscopy;  Surgeon: Raynelle Bring, MD;  Location: [facility];  Service: Urology;  Laterality: Left;  ONLY NEEDS 60 MIN   CYSTOSCOPY/URETEROSCOPY/HOLMIUM LASER/STENT PLACEMENT Left 11/07/2019   Procedure: CYSTOSCOPY/URETEROSCOPY/HOLMIUM LASER/STENT PLACEMENT;  Surgeon: Raynelle Bring, MD;  Location: [facility];  Service: Urology;  Laterality: Left;   FASCIOTOMY Right 01/24/2021   Procedure: FASCIOTOMY RIGHT LEG;  Surgeon: [name],  MD;  Location: [place];  Service: Orthopedics;  Laterality: Right;   FRACTURE SURGERY     I & D EXTREMITY Right 01/27/2021   Procedure: IRRIGATION AND DEBRIDEMENT RIGHT LOWER EXTREMITY;  Surgeon: [name], MD;  Location: [place];  Service: Orthopedics;  Laterality: Right;   INTRAVASCULAR ULTRASOUND/IVUS N/A 08/12/2019   Procedure: Intravascular Ultrasound/IVUS;  Surgeon: Nelva Bush, MD;  Location: [place] [facility];  Service: Cardiovascular;  Laterality: N/A;   IR CONVERT LEFT NEPHROSTOMY TO NEPHROURETERAL CATH  09/17/2019   IR NEPHROSTOMY PLACEMENT LEFT  02/05/2019   IR NEPHROSTOMY PLACEMENT LEFT  08/06/2019   LEFT HEART CATH AND CORONARY ANGIOGRAPHY N/A 08/09/2019   Procedure: LEFT HEART CATH AND CORONARY ANGIOGRAPHY;  Surgeon: Jettie Booze, MD;  Location: [place] [facility];  Service: Cardiovascular;  Laterality: N/A;   SKIN BIOPSY     TONSILLECTOMY AND ADENOIDECTOMY      Current Medications: Current Meds  Medication Sig   aspirin EC 81 MG EC tablet Take 1 tablet (81 mg total) by mouth daily.   ezetimibe (ZETIA) 10 MG tablet TAKE 1 TABLET(10 MG) BY MOUTH DAILY   hydrocortisone (ANUSOL-HC) 2.5 % rectal cream Place rectally 3 (three) times daily.   Omega-3 Fatty Acids (FISH OIL) 1000 MG CPDR Take 2,000 mg by mouth daily.   rosuvastatin (CRESTOR) 5 MG tablet Take 1 tablet (5 mg total) by mouth 2 (two) times a week. Pt. needs to make appt. with Dr. Radford Pax in order to receive further refills. Thank You. 1st Attempt.   silver sulfADIAZINE (SILVADENE) 1 % cream Apply 1 application topically daily.     Allergies:   Codeine and Keflex [cephalexin]   Social History   Socioeconomic History   Marital status: Married    Spouse name: Not on file   Number of children: Not on file   Years of education: Not on file   Highest education level: Not on file  Occupational History   Not on file  Tobacco Use   Smoking status: Former    Packs/day: 1.00    Years: 40.00    Total  pack years: 40.00    Types: Cigarettes    Quit date: 59    Years since quitting: 26.6   Smokeless tobacco: Never  Vaping Use   Vaping Use: Never used  Substance and Sexual Activity   Alcohol use: Not Currently   Drug use: Never   Sexual activity: Not on file  Other Topics Concern   Not on file  Social History Narrative   Not on file   Social Determinants of Health   Financial Resource Strain: Not on file  Food Insecurity: Not on file  Transportation Needs: Not on file  Physical Activity: Not on file  Stress: Not on file  Social Connections: Not on file     Family History: The patient's family history is not on file.  ROS:  Please see the history of present illness.    + DOE All other systems reviewed and are negative.  Labs/Other Studies Reviewed:    The following studies were reviewed today:  PFT 09/22/20  Minimal restrictive lung disease related to obesity and minimal reduction in DLCO which would not cause PHTN   Echo 03/05/20  1. Left ventricular ejection fraction, by estimation, is 55 to 60%. The  left ventricle has normal function. The left ventricle has no regional  wall motion abnormalities. There is mild left ventricular hypertrophy.  Left ventricular diastolic parameters  are consistent with Grade I diastolic dysfunction (impaired relaxation).   2. Right ventricular systolic function is normal. The right ventricular  size is normal. Tricuspid regurgitation signal is inadequate for assessing  PA pressure.   3. The mitral valve is normal in structure. Trivial mitral valve  regurgitation. No evidence of mitral stenosis.   4. The aortic valve is tricuspid. Aortic valve regurgitation is not  visualized. No aortic stenosis is present.   5. The inferior vena cava is normal in size with greater than 50%  respiratory variability, suggesting right atrial pressure of 3 mmHg.   6. Technically difficult study with poor acoustic windows.   LHC 08/09/19  Ramus  lesion is 75% stenosed. Mid LAD-1 lesion is 80% stenosed. Mid LAD-2 lesion is 99% stenosed. Prox RCA lesion is 90% stenosed. Mid RCA lesion is 25% stenosed. Prox Cx lesion is 80% stenosed. LV end diastolic pressure is moderately elevated. There is no aortic valve stenosis.   Severe three vessel disease.  Will get cardiac surgery opinion.  Diffuse distal vessel disease as well.     His RCA and circumflex are treatable percutaneously.  LAD appears to be a chronic occlusion and may not have a good target for LIMA graft.  He has significant other medical issues as well.  Discussed with Dr. [name].  If he is not a candidate for CABG and if PCI done, urology could wait 30 days post procedure to perform urologic surgery.   Coronary Stent Intervention 08/12/19  Conclusions: Severe three-vessel coronary artery disease, as documented on last weeks diagnostic catheterization. Successful IVUS-guided PCI of the proximal LCx using Synergy 3.5 x 16 mm drug-eluting stent with 0% residual stenosis and TIMI-3 flow. Successful IVUS-guided PCI to proximal RCA using Synergy 4.0 x 20 mm drug eluting stent with 0% residual stenosis and TIMI-3 flow.   Recommendations: Dual antiplatelet therapy with aspirin and ticagrelor for at least 12 months, ideally longer.  If possible, DAPT should not be interrupted for at least 1 month unless there is life threatening bleeding or emergent surgery is needed. Aggressive secondary prevention of coronary artery disease and medical management of chronically occluded LAD.     Recent Labs: 02/07/2021: Magnesium 2.1 02/11/2021: ALT 41; BUN 30; Creatinine, Ser 1.40; Hemoglobin 9.9; Platelets 397; Potassium 5.0; Sodium 133  Recent Lipid Panel    Component Value Date/Time   CHOL 106 08/10/2020 0731   TRIG 96 08/10/2020 0731   HDL 28 (L) 08/10/2020 0731   CHOLHDL 3.8 08/10/2020 0731   CHOLHDL 9.0 08/09/2019 0521   VLDL 47 (H) 08/09/2019 0521   LDLCALC 59 08/10/2020 0731      Risk Assessment/Calculations:      Physical Exam:    VS:  BP 138/78   Pulse 88   Ht '5\' 8"'$  (1.727 m)   Wt 291 lb (132 kg)   SpO2 95%   BMI 44.25 kg/m     Wt Readings  from Last 3 Encounters:  12/16/21 291 lb (132 kg)  04/09/21 264 lb (119.7 kg)  01/24/21 264 lb (119.7 kg)     GEN:  Well developed, obese male in no acute distress HEENT: Normal NECK: No JVD; No carotid bruits CARDIAC: RRR, no murmurs, rubs, gallops RESPIRATORY:  Clear to auscultation without rales, wheezing or rhonchi  ABDOMEN: Soft, non-tender, protuberant MUSCULOSKELETAL:  No edema; No deformity. 2+ pedal pulses, equal bilaterally SKIN: Warm and dry NEUROLOGIC:  Alert and oriented x 3 PSYCHIATRIC:  Normal affect   EKG:  EKG is ordered today.  The ekg ordered today demonstrates sinus rhythm at 88 bpm with occasional PVC, no acute ST/T abnormality  Diagnoses:    1. Ischemic cardiomyopathy   2. Essential hypertension   3. Coronary artery disease due to lipid rich plaque   4. Hyperlipidemia LDL goal <70   5. DOE (dyspnea on exertion)    Assessment and Plan:     DOE: Unable to walk a short distance without stopping to rest.  Likely multifactorial 2/2 CHF, obesity, pulmonary hypertension. History of ICM 08/2019, EF normalized on echo 03/2020.  Mild bilateral LE edema. He denies chest pain, orthopnea, PND. Will update echocardiogram to evaluate for structural heart disease and heart function. No longer on ACE/ARB/ARNI, not on diuretic.  Will evaluate results of echocardiogram before making further recommendations for medical therapy.  Chronic HFpEF: Normal LV function, grade 1 diastolic function on echo 03/2020.  Body habitus makes it difficult to assess volume status. Is hesitant to take medications due to history of lightheadedness. Will evaluate LV function on echo and make further recommendations for medical therapy at that time.   CAD without angina: Type II NSTEMI 08/2019 due to demand ischemia in  the setting of acute respiratory failure. Cath showed severe 3 vessel CAD, surgery was consulted but felt PCI along with medical management was best option.  Underwent PCI/DES of proximal LCx and PCI/DES of proximal RCA on 08/12/19 with recommendation to continue DAPT for 1 year. States cardiology advised him to stop Brilinta during hospitalization for compartment syndrome 01/2021. No bleeding concerns on ASA.  Reports that he stopped metoprolol.  We will resume Toprol XL 25 mg once daily.  Continue Zetia, rosuvastatin, aspirin.  OSA: Mild OSA by sleep study not currently being managed.  He refuses to wear a mask. [facility]ax.  Pulmonary hypertension: PASP 55 mmHg on echo 08/2019.  2D echo 03/2020 could not assess for PAP due to lack of TR jet.  PFTs 08/2020 revealed minimal restrictive lung disease related to obesity and minimal reduction in DLCO which would not cause PHTN. Will assess pulmonary arterial pressure on upcoming echo.  Recommended that he treat sleep apnea.   Hyperlipidemia LDL goal < 70: LDL 69 in March 2023.  Has been taking rosuvastatin 2.5 mg twice weekly.  I have asked him to increase to 2.5 mg daily. He agrees to try it.  Continue ezetimibe.  Disposition: 6 months with Dr. Radford Pax  Medication Adjustments/Labs and Tests Ordered: Current medicines are reviewed at length with the patient today.  Concerns regarding medicines are outlined above.  Orders Placed This Encounter  Procedures   EKG 12-Lead   ECHOCARDIOGRAM COMPLETE   Meds ordered this encounter  Medications   metoprolol succinate (TOPROL-XL) 25 MG 24 hr tablet    Sig: Take 1 tablet (25 mg total) by mouth daily. Take with or immediately following a meal.    Dispense:  90 tablet    Refill:  3    Patient Instructions  Medication Instructions:   START Toprol one (1) tablet by mouth (25 mg) daily.   *If you need a refill on your cardiac medications before your next appointment, please  call your pharmacy*   Lab Work:  None ordered.  If you have labs (blood work) drawn today and your tests are completely normal, you will receive your results only by: [place] (if you have MyChart) OR A paper copy in the mail If you have any lab test that is abnormal or we need to change your treatment, we will call you to review the results.   Testing/Procedures:  Your physician has requested that you have an echocardiogram. Echocardiography is a painless test that uses sound waves to create images of your heart. It provides your doctor with information about the size and shape of your heart and how well your heart's chambers and valves are working. This procedure takes approximately one hour. There are no restrictions for this procedure.    Follow-Up: At [facility], you and your health needs are our priority.  As part of our continuing mission to provide you with exceptional heart care, we have created designated Provider Care Teams.  These Care Teams include your primary Cardiologist (physician) and Advanced Practice Providers (APPs -  Physician Assistants and Nurse Practitioners) who all work together to provide you with the care you need, when you need it.  We recommend signing up for the patient portal called "MyChart".  Sign up information is provided on this After Visit Summary.  MyChart is used to connect with patients for Virtual Visits (Telemedicine).  Patients are able to view lab/test results, encounter notes, upcoming appointments, etc.  Non-urgent messages can be sent to your provider as well.   To learn more about what you can do with MyChart, go to [email or web address].    Your next appointment:   6 month(s)  The format for your next appointment:   In Person  Provider:   Fransico Him, MD     Other Instructions  Your physician wants you to follow-up in: 6 months with Dr.Turner. You will receive a reminder letter in the mail two months in advance. If  you don't receive a letter, please call our office to schedule the follow-up appointment.   Important Information About Sugar         Signed, Emmaline Life, NP  12/16/2021 2:26 PM    [facility]

## 2021-12-16 ENCOUNTER — Ambulatory Visit: Payer: Medicare Other | Admitting: Nurse Practitioner

## 2021-12-16 ENCOUNTER — Encounter: Payer: Self-pay | Admitting: Nurse Practitioner

## 2021-12-16 VITALS — BP 138/78 | HR 88 | Ht 68.0 in | Wt 291.0 lb

## 2021-12-16 DIAGNOSIS — I2583 Coronary atherosclerosis due to lipid rich plaque: Secondary | ICD-10-CM

## 2021-12-16 DIAGNOSIS — I255 Ischemic cardiomyopathy: Secondary | ICD-10-CM | POA: Diagnosis not present

## 2021-12-16 DIAGNOSIS — E785 Hyperlipidemia, unspecified: Secondary | ICD-10-CM | POA: Insufficient documentation

## 2021-12-16 DIAGNOSIS — I2510 Atherosclerotic heart disease of native coronary artery without angina pectoris: Secondary | ICD-10-CM

## 2021-12-16 DIAGNOSIS — I10 Essential (primary) hypertension: Secondary | ICD-10-CM | POA: Diagnosis not present

## 2021-12-16 DIAGNOSIS — G4730 Sleep apnea, unspecified: Secondary | ICD-10-CM | POA: Insufficient documentation

## 2021-12-16 DIAGNOSIS — Z955 Presence of coronary angioplasty implant and graft: Secondary | ICD-10-CM | POA: Insufficient documentation

## 2021-12-16 DIAGNOSIS — R0609 Other forms of dyspnea: Secondary | ICD-10-CM

## 2021-12-16 MED ORDER — METOPROLOL SUCCINATE ER 25 MG PO TB24: 25.0000 mg | ORAL_TABLET | Freq: Every day | ORAL | 3 refills | Status: DC

## 2021-12-16 NOTE — Patient Instructions (Signed)
Medication Instructions:   START Toprol one (1) tablet by mouth (25 mg) daily.   *If you need a refill on your cardiac medications before your next appointment, please call your pharmacy*   Lab Work:  None ordered.  If you have labs (blood work) drawn today and your tests are completely normal, you will receive your results only by: [place] (if you have MyChart) OR A paper copy in the mail If you have any lab test that is abnormal or we need to change your treatment, we will call you to review the results.   Testing/Procedures:  Your physician has requested that you have an echocardiogram. Echocardiography is a painless test that uses sound waves to create images of your heart. It provides your doctor with information about the size and shape of your heart and how well your heart's chambers and valves are working. This procedure takes approximately one hour. There are no restrictions for this procedure.    Follow-Up: At [facility], you and your health needs are our priority.  As part of our continuing mission to provide you with exceptional heart care, we have created designated Provider Care Teams.  These Care Teams include your primary Cardiologist (physician) and Advanced Practice Providers (APPs -  Physician Assistants and Nurse Practitioners) who all work together to provide you with the care you need, when you need it.  We recommend signing up for the patient portal called "MyChart".  Sign up information is provided on this After Visit Summary.  MyChart is used to connect with patients for Virtual Visits (Telemedicine).  Patients are able to view lab/test results, encounter notes, upcoming appointments, etc.  Non-urgent messages can be sent to your provider as well.   To learn more about what you can do with MyChart, go to [email or web address].    Your next appointment:   6 month(s)  The format for your next appointment:   In Person  Provider:   Fransico Him,  MD     Other Instructions  Your physician wants you to follow-up in: 6 months with Dr.Turner. You will receive a reminder letter in the mail two months in advance. If you don't receive a letter, please call our office to schedule the follow-up appointment.   Important Information About Sugar

## 2021-12-23 MED ORDER — METOPROLOL SUCCINATE ER 25 MG PO TB24: 25.0000 mg | ORAL_TABLET | Freq: Every day | ORAL | 3 refills | Status: DC

## 2021-12-31 ENCOUNTER — Ambulatory Visit (HOSPITAL_COMMUNITY): Payer: Medicare Other | Attending: Cardiology

## 2021-12-31 DIAGNOSIS — E785 Hyperlipidemia, unspecified: Secondary | ICD-10-CM | POA: Insufficient documentation

## 2021-12-31 DIAGNOSIS — R0609 Other forms of dyspnea: Secondary | ICD-10-CM | POA: Insufficient documentation

## 2021-12-31 DIAGNOSIS — I10 Essential (primary) hypertension: Secondary | ICD-10-CM

## 2021-12-31 DIAGNOSIS — I2583 Coronary atherosclerosis due to lipid rich plaque: Secondary | ICD-10-CM | POA: Insufficient documentation

## 2021-12-31 DIAGNOSIS — I255 Ischemic cardiomyopathy: Secondary | ICD-10-CM | POA: Diagnosis not present

## 2021-12-31 DIAGNOSIS — I2510 Atherosclerotic heart disease of native coronary artery without angina pectoris: Secondary | ICD-10-CM

## 2021-12-31 LAB — ECHOCARDIOGRAM COMPLETE
Area-P 1/2: 3.18 cm2
S' Lateral: 2.35 cm

## 2022-02-15 IMAGING — XA IR CONVERT NEPHROMSTOMY TO NEPHROURETERAL CATH LEFT
6 series · 13 of 24 positions shown · non-contrast
Comparison: CT abdomen pelvis - earlier same day;

CLINICAL DATA: History of prostatomegaly and nephrolithiasis with
stone within the superior/mid aspect of the left ureter resulting in
upstream left-sided ureterectasis and pelvicaliectasis post
left-sided nephrostomy catheter placement on 08/06/2019.

EXAM:
1. LEFT NEPHROSTOGRAM.
2. LEFT URETERAL STENT PLACEMENT
3. LEFT PERCUTANEOUS NEPHROSTOMY CATHETER EXCHANGE

[Series 2: fl - angio · 2 of 75 frames shown (1 of 5)]
[frame 12/75]
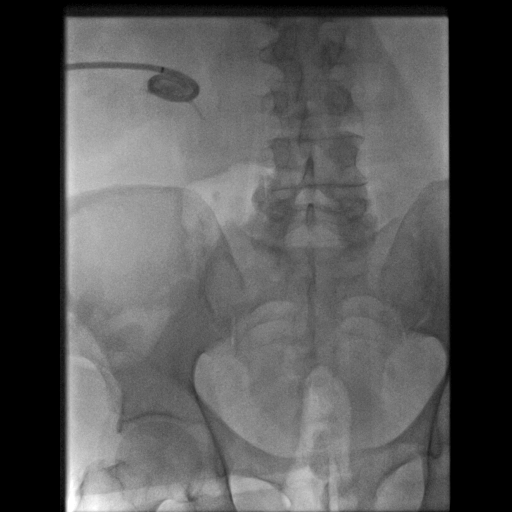
[frame 64/75]
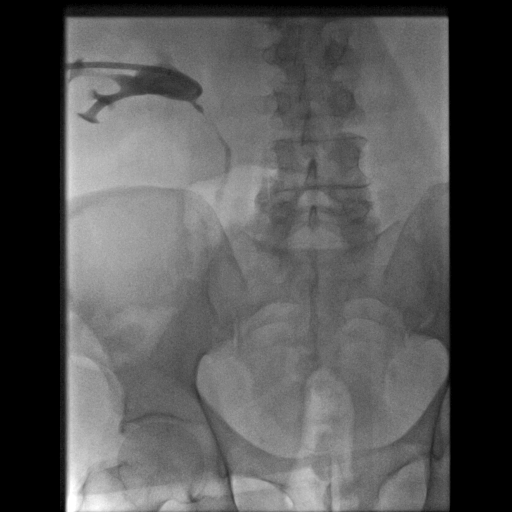

[Series 5: fl - angio · 2 of 20 frames shown (2 of 5)]
[frame 7/20]
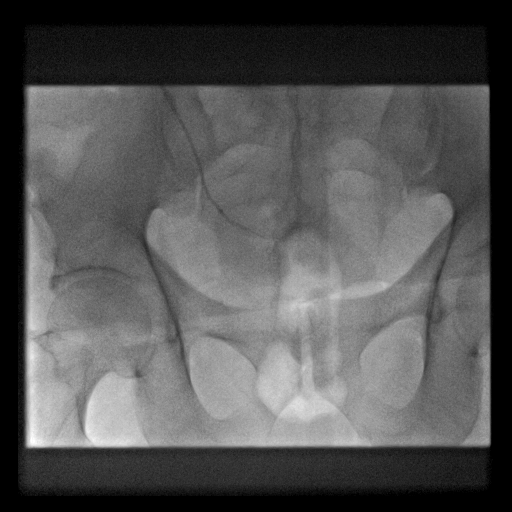
[frame 18/20]
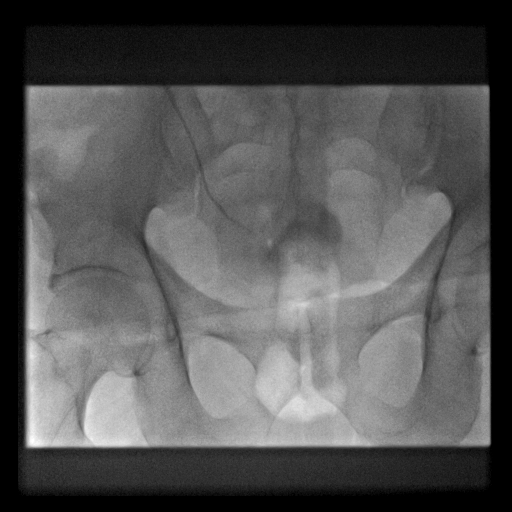

[Series 7: fl - angio · 1 of 32 frames shown (3 of 5)]
[frame 28/32]
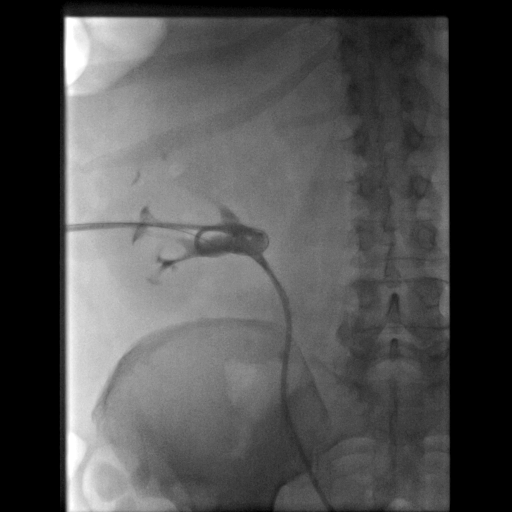

[Series 10: fl - angio · 2 of 21 frames shown (4 of 5)]
[frame 4/21]
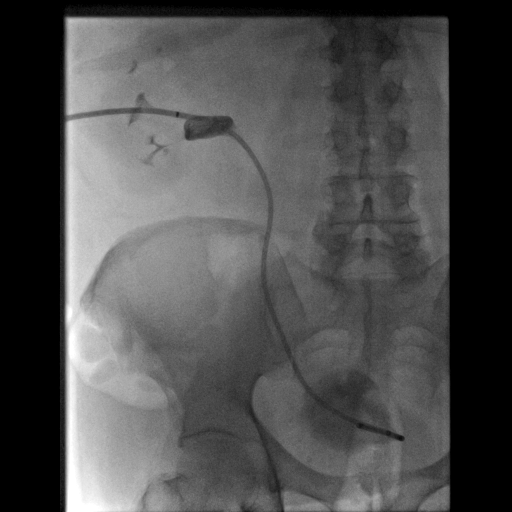
[frame 20/21]
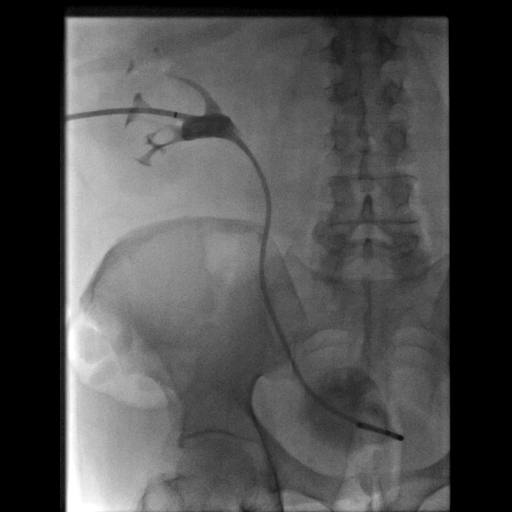

[Series 11: fl - angio · 2 of 30 frames shown (5 of 5)]
[frame 5/30]
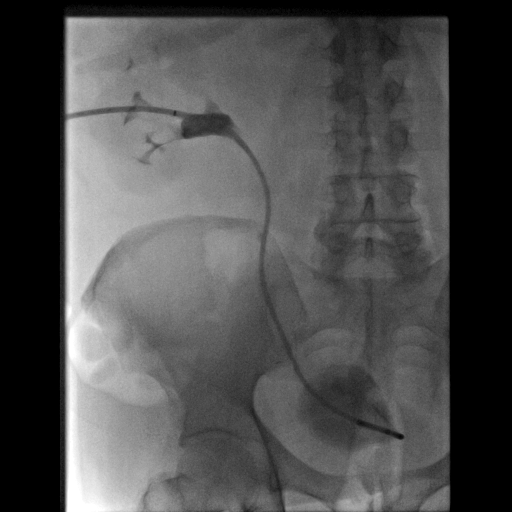
[frame 26/30]
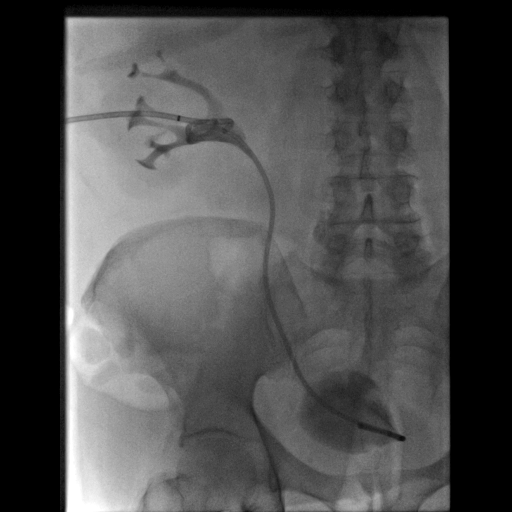

[Series 300: ir convert left nephrostomy to nephroure · 4 of 9 slices shown]
[im 2/9]
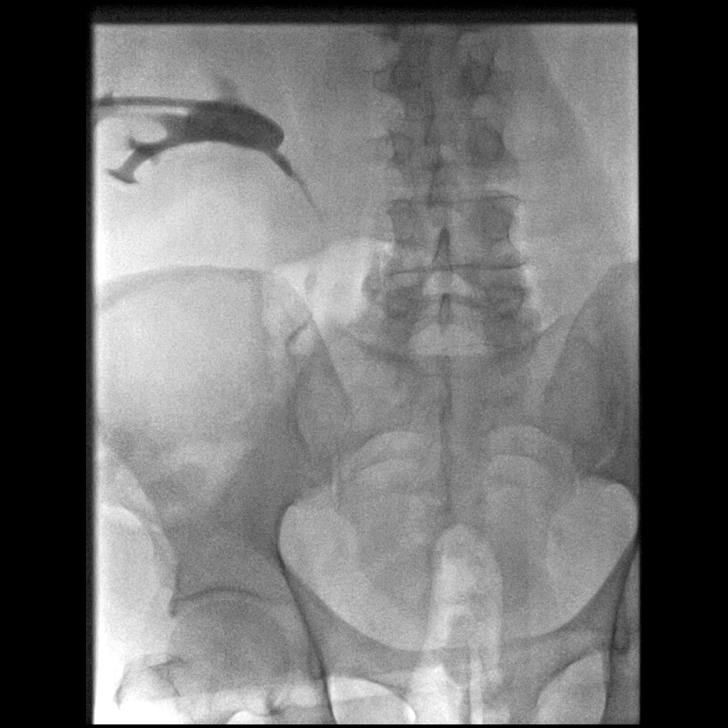
[im 4/9]
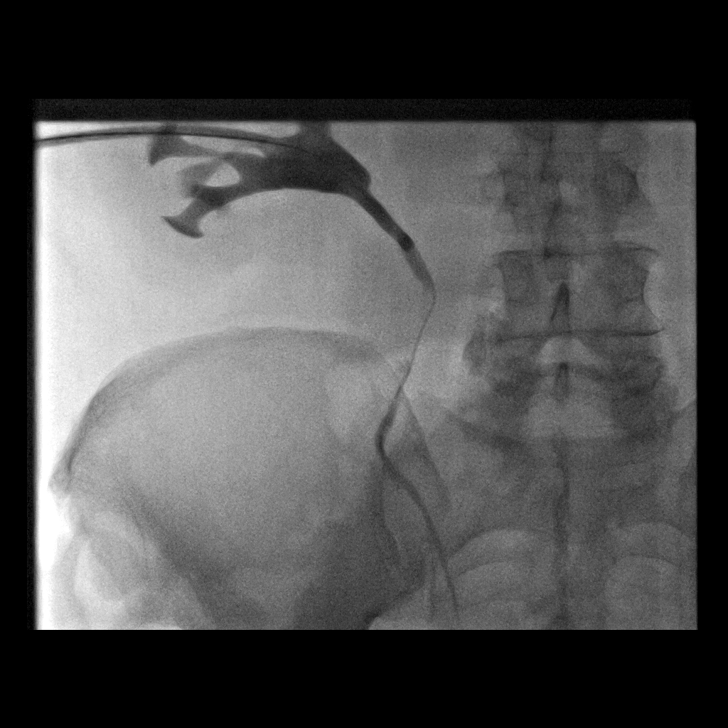
[im 7/9]
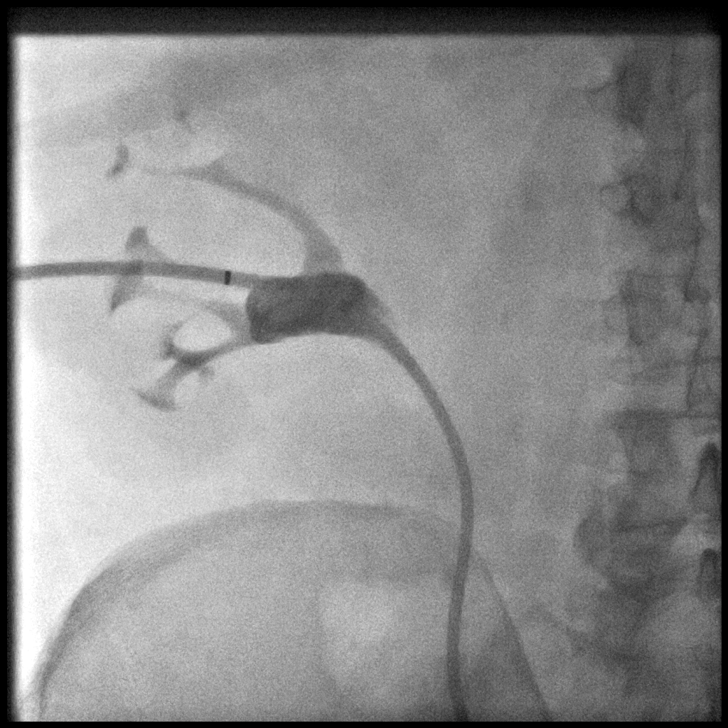
[im 9/9]
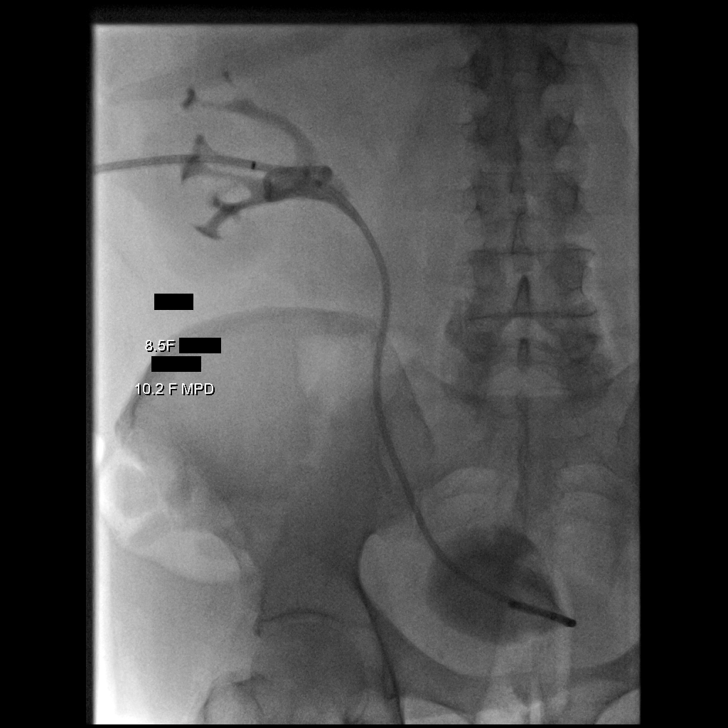

[13 of 24 positions shown; findings below may reference images not displayed]

08/04/2019

Image guided left-sided nephrostomy catheter placement - 08/06/2019

MEDICATIONS:
Ancef 2 g IV; The antibiotics were administered within 1 hour of the
procedure.

ANESTHESIA/SEDATION:
Moderate (conscious) sedation was employed during this procedure. A
total of Versed 2.5 mg and Fentanyl 100 mcg was administered
intravenously.

Moderate Sedation Time: 22 minutes. The patient's level of
consciousness and vital signs were monitored continuously by
radiology nursing throughout the procedure under my direct
supervision.

CONTRAST:  25mL OMNIPAQUE IOHEXOL 300 MG/ML SOLN - administered into
the left renal collecting system

FLUOROSCOPY TIME:  4 minutes, 6 seconds (181 mGy)

COMPLICATIONS:
None immediate

PROCEDURE:
The procedure, risks, benefits, and alternatives were explained to
the patient. Questions regarding the procedure were encouraged and
answered. The patient understands and consents to the procedure.

The external portion of the left-sided nephrostomy tube and
surrounding skin were prepped with Betadine in a sterile fashion,
and a sterile drape was applied covering the operative field. A
sterile gown and sterile gloves were used for the procedure. Local
anesthesia was provided with 1% Lidocaine.

The preexisting nephrostomy tube was injected with contrast
material. Fluoroscopic spot images were obtained of the collecting
system and ureter to the level of the urinary bladder. The existing
nephrostomy catheter was cut and cannulated with a Benson wire.
Under intermittent fluoroscopic guidance, the existing nephrostomy
catheter was exchanged for an 8-French vascular sheath which was
advanced into the renal pelvis.

With the use of both an MPA and a 4 French angled glide catheter, an
Amplatz wire was advanced beyond the partially obstructing stone
within the superior aspect of the left ureter to the level of the
urinary bladder. Contrast injection confirmed appropriate
positioning.

Utilizing the vascular sheath, a Bentson wire was coiled within the
renal pelvis to serve as a safety wire. The MPA catheter was then
utilized for ureteral stent measurement purposes.

Next, an 8-French, 22 cm ureteral stent was then advanced over an
Amplatz superstiff guidewire. The distal portion was formed within
the urinary bladder while the proximal portion was then formed
within the renal pelvis. Contrast injection confirmed appropriate
positioning. At this point, the safety wire was utilized to replace
an existing 10.2 French percutaneous nephrostomy catheter within the
left renal pelvis.

A small amount of contrast was injected via the existing nephrostomy
catheter confirming appropriate positioning and functionality of the
nephrostomy catheter and the ureteral stent. Postprocedural spot
fluoroscopic and radiographic images were obtained in various
obliquities.

The nephrostomy was capped and secured at the skin entrance site
within interrupted suture and a Stat Lock device. Dressings were
applied. The patient tolerated the procedure well without immediate
postprocedural complication
FINDINGS: Preprocedural spot fluoroscopic images demonstrate unchanged
positioning of left-sided nephrostomy catheter with end coiled and
locked over the expected location of the left renal pelvis. There is
an ill-defined opacity overlying the expected location of the
superior/mid aspect of the left ureter compatible with the ureteral
stone demonstrated on preceding abdominal CT

Contrast injection confirmed appropriate position functionality of
the left-sided nephrostomy catheter.

After successful traversal of the partially obstructing ureteral
stone, a 8 French 22 cm ureteral stent is appropriately positioned
with superior coil within the renal pelvis and inferior coil within
bladder.

After fluoroscopic guided exchange, the 10 French nephrostomy
catheter is appropriately positioned with end coiled and locked
within left renal pelvis.
IMPRESSION: 1. Successful placement of a left sided, 8 French, 22 cm, ureteral
stent
2. Successful fluoroscopic guided exchange of left sided 10.2 French
nephrostomy catheter.

PLAN:
- The nephrostomy tube was capped for a trial of internalization.
The patient was given an extra gravity bag and instructed to connect
the nephrostomy catheter to the gravity bag if he were to experience
recurrent left-sided urinary obstructive symptoms.

- There are no immediate pants for the patient to return to the
interventional [HOSPITAL] as he is to undergo definitive
stone extraction by Dr. Zarga.

## 2022-02-17 IMAGING — DX DG CHEST 2V
2 series · 2 of 2 positions shown · non-contrast
Comparison: 08/06/2019.

CLINICAL DATA: Recent MRI.  Kidney stone.

EXAM:
CHEST - 2 VIEW

[w chest pa]
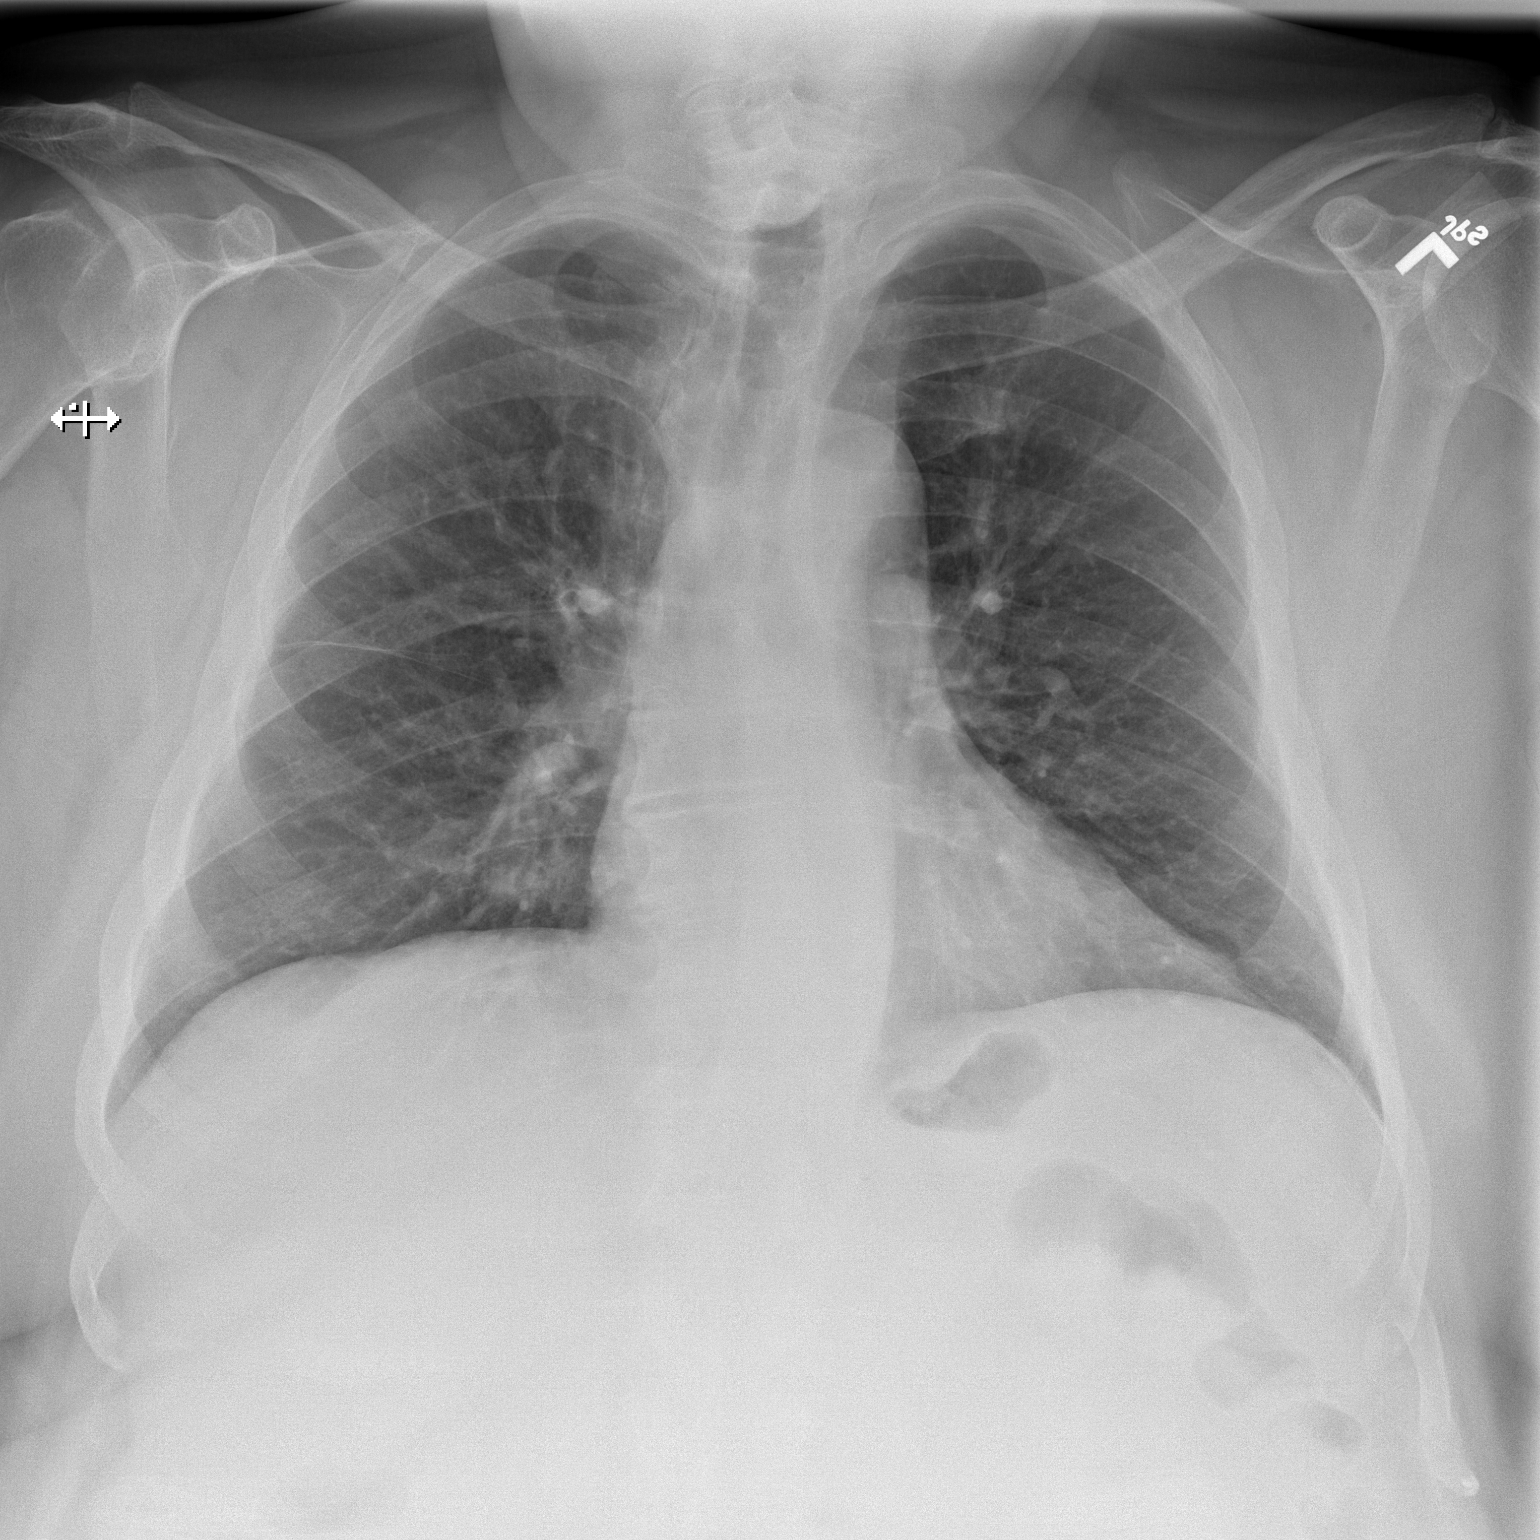

[w chest lat]
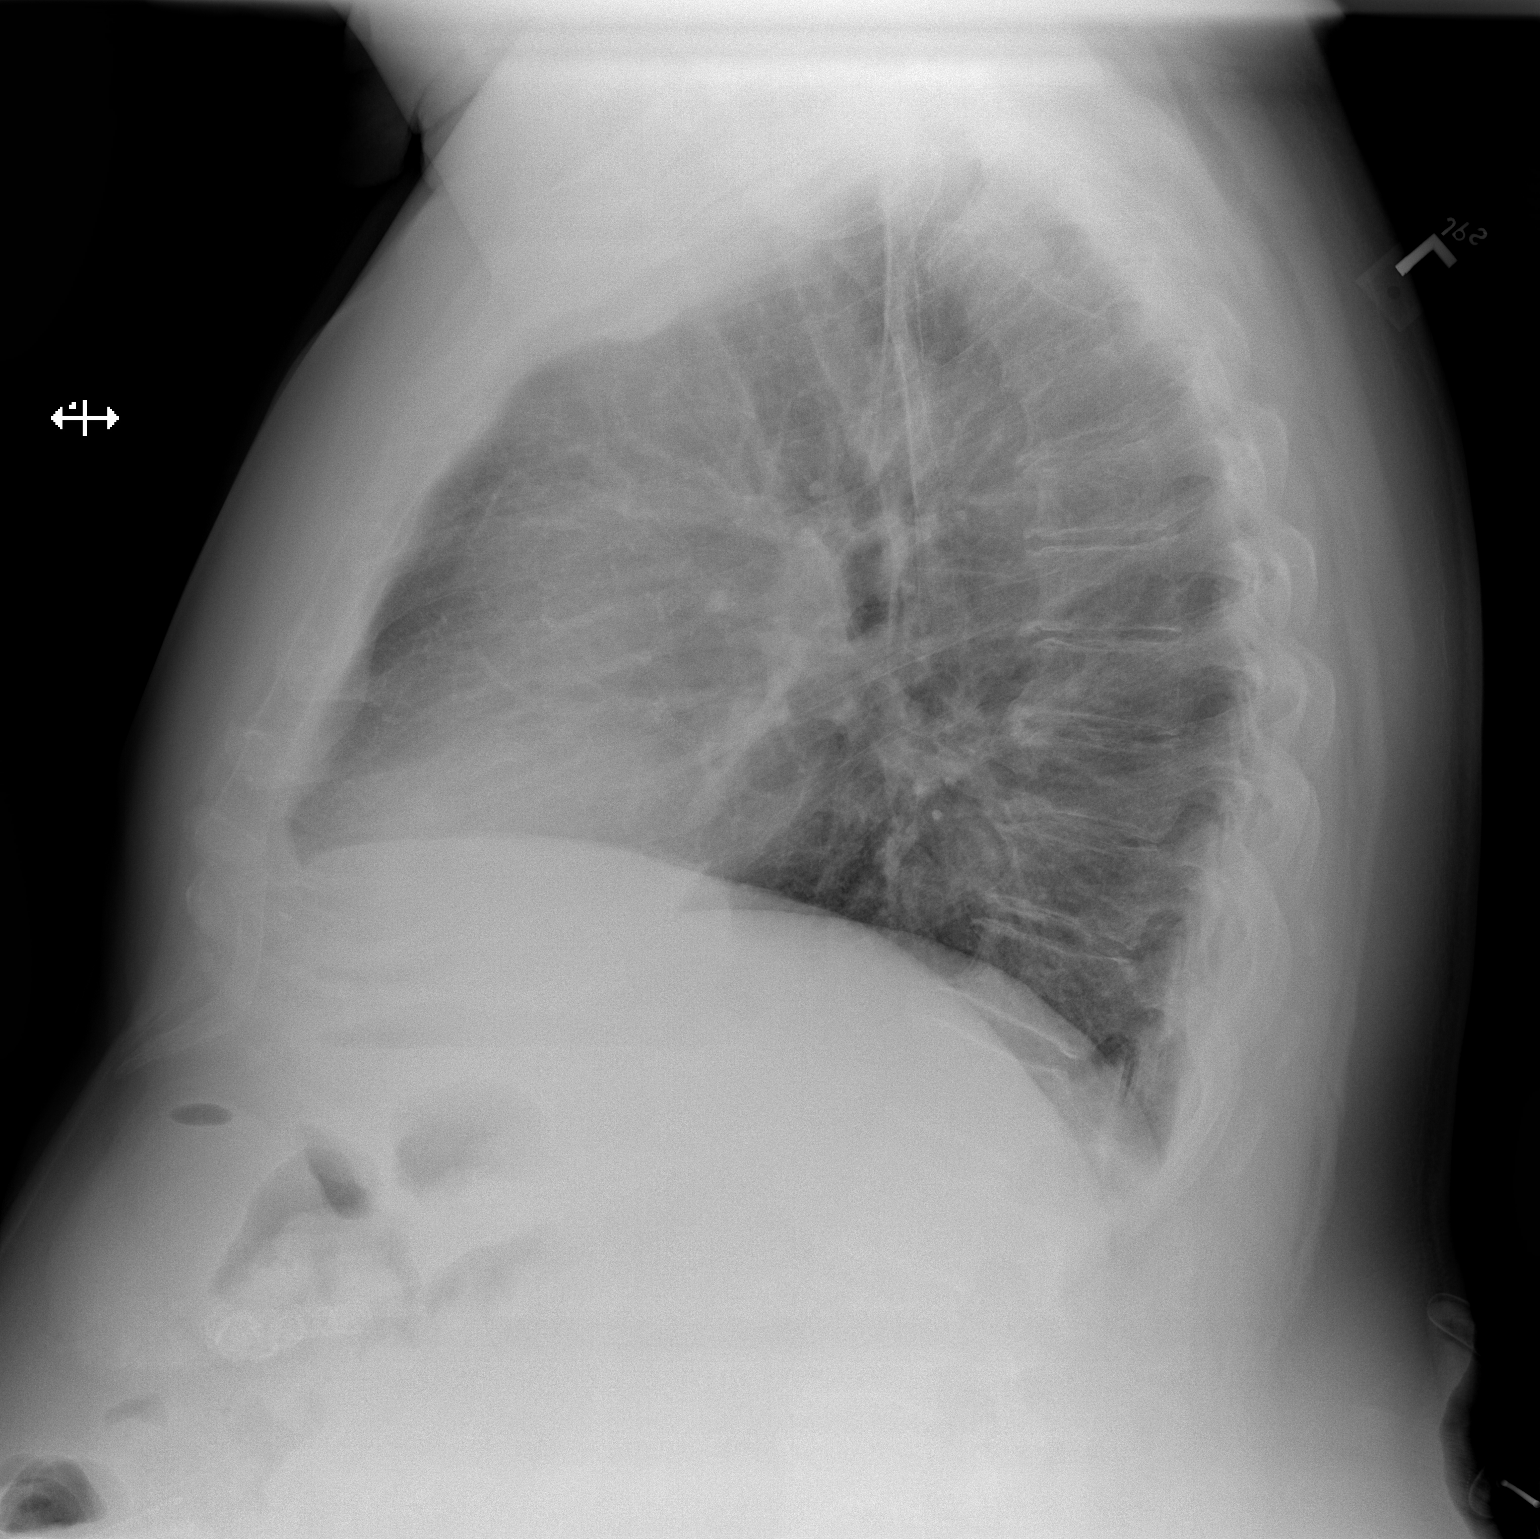

[2 of 2 positions shown; findings below may reference images not displayed]

FINDINGS: Mediastinum and hilar structures normal. Heart size normal. Low lung
volumes. No pleural effusion or pneumothorax. Degenerative change
thoracic spine.
IMPRESSION: Low lung volumes.  No acute cardiopulmonary disease.

## 2022-05-13 DIAGNOSIS — R972 Elevated prostate specific antigen [PSA]: Secondary | ICD-10-CM | POA: Diagnosis not present

## 2022-05-20 DIAGNOSIS — R3912 Poor urinary stream: Secondary | ICD-10-CM | POA: Diagnosis not present

## 2022-05-20 DIAGNOSIS — N401 Enlarged prostate with lower urinary tract symptoms: Secondary | ICD-10-CM | POA: Diagnosis not present

## 2022-05-20 DIAGNOSIS — R972 Elevated prostate specific antigen [PSA]: Secondary | ICD-10-CM | POA: Diagnosis not present

## 2022-08-16 DIAGNOSIS — Z0000 Encounter for general adult medical examination without abnormal findings: Secondary | ICD-10-CM | POA: Diagnosis not present

## 2022-09-22 ENCOUNTER — Ambulatory Visit (HOSPITAL_BASED_OUTPATIENT_CLINIC_OR_DEPARTMENT_OTHER): Payer: Medicare Other | Admitting: Orthopaedic Surgery

## 2022-09-22 DIAGNOSIS — Z9889 Other specified postprocedural states: Secondary | ICD-10-CM | POA: Diagnosis not present

## 2022-09-22 NOTE — Progress Notes (Signed)
Post Operative Evaluation    Procedure/Date of Surgery: 01/27/21, right leg fasciotomy and subsequent wound closure with skin grafting  Interval History:   09/22/2022: Presents today for follow-up of his right lower extremity.  He has been using healing honey on his right lower extremity wounds.  Unfortunately he has somewhat developed venous stasis changes chronically now on the right lower extremity  Presents today for repeat wound check.  Overall his swelling is significantly better.  Wounds are healed at this time.  He occasionally notes a sharp shooting pain around the incisions which will wake him up at night.    PMH/PSH/Family History/Social History/Meds/Allergies:    Past Medical History:  Diagnosis Date   Cancer (HCC)    CHF (congestive heart failure) (HCC)    EF 45-50% on 08/2019 echo   Chronic kidney disease    RENAL STONES   Coronary artery disease    3 vessel disease: DES to RCA, DES to LCx 08/2019 and medical management   High frequency hearing loss    History of migraine headaches    Hx of colonic polyps    Hyperlipidemia    Hypertension    Obesity    Pulmonary hypertension (HCC)    Seborrheic dermatitis    Vitiligo    Past Surgical History:  Procedure Laterality Date   APPLICATION OF WOUND VAC Right 01/24/2021   Procedure: APPLICATION OF WOUND VAC;  Surgeon: Huel Cote, MD;  Location: [facility] OR;  Service: Orthopedics;  Laterality: Right;   COLON SURGERY  01/30/2001   CORONARY STENT INTERVENTION N/A 08/12/2019   Procedure: CORONARY STENT INTERVENTION;  Surgeon: Yvonne Kendall, MD;  Location: [facility];  Service: Cardiovascular;  Laterality: N/A;   CORONARY ULTRASOUND/IVUS N/A 08/12/2019   Procedure: Intravascular Ultrasound/IVUS;  Surgeon: Yvonne Kendall, MD;  Location: [facility];  Service: Cardiovascular;  Laterality: N/A;   CYSTOSCOPY N/A 02/03/2021   Procedure: CYSTOSCOPY, CLOT EVAUATION;  Surgeon:  Heloise Purpura, MD;  Location: [facility] OR;  Service: Urology;  Laterality: N/A;   CYSTOSCOPY/URETEROSCOPY/HOLMIUM LASER/STENT PLACEMENT Left 02/04/2019   Procedure: CYSTOSCOPY Left retrograde and attempted left ureteroscopy;  Surgeon: Heloise Purpura, MD;  Location: [facility] ORS;  Service: Urology;  Laterality: Left;  ONLY NEEDS 60 MIN   CYSTOSCOPY/URETEROSCOPY/HOLMIUM LASER/STENT PLACEMENT Left 11/07/2019   Procedure: CYSTOSCOPY/URETEROSCOPY/HOLMIUM LASER/STENT PLACEMENT;  Surgeon: Heloise Purpura, MD;  Location: [facility] OR[facility];  Service: Urology;  Laterality: Left;   FASCIOTOMY Right 01/24/2021   Procedure: FASCIOTOMY RIGHT LEG;  Surgeon: Huel Cote, MD;  Location: [facility] OR;  Service: Orthopedics;  Laterality: Right;   FRACTURE SURGERY     I & D EXTREMITY Right 01/27/2021   Procedure: IRRIGATION AND DEBRIDEMENT RIGHT LOWER EXTREMITY;  Surgeon: Huel Cote, MD;  Location: [facility] OR;  Service: Orthopedics;  Laterality: Right;   IR CONVERT LEFT NEPHROSTOMY TO NEPHROURETERAL CATH  09/17/2019   IR NEPHROSTOMY PLACEMENT LEFT  02/05/2019   IR NEPHROSTOMY PLACEMENT LEFT  08/06/2019   LEFT HEART CATH AND CORONARY ANGIOGRAPHY N/A 08/09/2019   Procedure: LEFT HEART CATH AND CORONARY ANGIOGRAPHY;  Surgeon: Corky Crafts, MD;  Location: [facility] INVASIVE CV LAB;  Service: Cardiovascular;  Laterality: N/A;   SKIN BIOPSY     TONSILLECTOMY AND ADENOIDECTOMY     Social History   Socioeconomic History   Marital status: Married  Spouse name: Not on file   Number of children: Not on file   Years of education: Not on file   Highest education level: Not on file  Occupational History   Not on file  Tobacco Use   Smoking status: Former    Packs/day: 1.00    Years: 40.00    Additional pack years: 0.00    Total pack years: 40.00    Types: Cigarettes    Quit date: 61    Years since quitting: 27.4   Smokeless tobacco: Never  Vaping Use   Vaping Use: Never used  Substance and Sexual Activity   Alcohol use: Not  Currently   Drug use: Never   Sexual activity: Not on file  Other Topics Concern   Not on file  Social History Narrative   Not on file   Social Determinants of Health   Financial Resource Strain: Not on file  Food Insecurity: Not on file  Transportation Needs: Not on file  Physical Activity: Not on file  Stress: Not on file  Social Connections: Not on file   No family history on file. Allergies  Allergen Reactions   Codeine     Bad Headache   Keflex [Cephalexin] Rash   Current Outpatient Medications  Medication Sig Dispense Refill   aspirin EC 81 MG EC tablet Take 1 tablet (81 mg total) by mouth daily. 30 tablet 11   ezetimibe (ZETIA) 10 MG tablet TAKE 1 TABLET(10 MG) BY MOUTH DAILY 90 tablet 2   finasteride (PROSCAR) 5 MG tablet Take 1 tablet (5 mg total) by mouth daily. (Patient not taking: Reported on 04/09/2021) 30 tablet 3   gabapentin (NEURONTIN) 300 MG capsule Take 1 capsule (300 mg total) by mouth 3 (three) times daily. (Patient not taking: Reported on 04/09/2021) 15 capsule 0   hydrocortisone (ANUSOL-HC) 2.5 % rectal cream Place rectally 3 (three) times daily. 30 g 0   metoprolol succinate (TOPROL-XL) 25 MG 24 hr tablet Take 1 tablet (25 mg total) by mouth daily. Take with or immediately following a meal. 90 tablet 3   nitroGLYCERIN (NITROSTAT) 0.4 MG SL tablet Place 1 tablet (0.4 mg total) under the tongue every 5 (five) minutes as needed for chest pain. 25 tablet 3   Omega-3 Fatty Acids (FISH OIL) 1000 MG CPDR Take 2,000 mg by mouth daily.     rosuvastatin (CRESTOR) 5 MG tablet Take 1 tablet (5 mg total) by mouth 2 (two) times a week. Pt. needs to make appt. with Dr. Mayford Knife in order to receive further refills. Thank You. 1st Attempt. 26 tablet 0   silver sulfADIAZINE (SILVADENE) 1 % cream Apply 1 application topically daily. 50 g 0   No current facility-administered medications for this visit.   No results found.  Review of Systems:   A ROS was performed including  pertinent positives and negatives as documented in the HPI.   Musculoskeletal Exam:    There were no vitals taken for this visit.  Right leg medial incision is well-healed.  Skin graft incision is healed.  There is some exudate over the wound consistent with ongoing normal healing.  Exudate has resolved sensation intact in all distributions 2+ dorsalis pedis pulse  Imaging:    None  I personally reviewed and interpreted the radiographs.   Assessment:   77 year old male with right leg compartment syndrome treated with fasciotomy and subsequent closure with skin grafting.  Overall he has now suffered longer-term consequences of the compartment syndrome with chronic venous  stages changes of the right lower extremity.  I do believe that this is left him with residual disability and swelling as well as some painful symptoms.  I would like to plan to refer him back to Dr. Sherral Hammers for discussion of this.  He has been able to purchase compression stockings as well. Plan :    Return to clinic as needed      I personally saw and evaluated the patient, and participated in the management and treatment plan.  Huel Cote, MD Attending Physician, Orthopedic Surgery  This document was dictated using Dragon voice recognition software. A reasonable attempt at proof reading has been made to minimize errors.

## 2022-09-28 ENCOUNTER — Ambulatory Visit (HOSPITAL_BASED_OUTPATIENT_CLINIC_OR_DEPARTMENT_OTHER): Payer: Medicare Other | Admitting: Family

## 2022-09-28 ENCOUNTER — Telehealth: Payer: Self-pay

## 2022-09-28 ENCOUNTER — Encounter (HOSPITAL_BASED_OUTPATIENT_CLINIC_OR_DEPARTMENT_OTHER): Payer: Self-pay | Admitting: Family

## 2022-09-28 ENCOUNTER — Other Ambulatory Visit (HOSPITAL_COMMUNITY): Payer: Self-pay

## 2022-09-28 VITALS — BP 132/86 | HR 70 | Ht 68.0 in | Wt 281.0 lb

## 2022-09-28 DIAGNOSIS — I255 Ischemic cardiomyopathy: Secondary | ICD-10-CM

## 2022-09-28 DIAGNOSIS — I25118 Atherosclerotic heart disease of native coronary artery with other forms of angina pectoris: Secondary | ICD-10-CM | POA: Diagnosis not present

## 2022-09-28 DIAGNOSIS — I10 Essential (primary) hypertension: Secondary | ICD-10-CM | POA: Diagnosis not present

## 2022-09-28 DIAGNOSIS — E785 Hyperlipidemia, unspecified: Secondary | ICD-10-CM | POA: Diagnosis not present

## 2022-09-28 DIAGNOSIS — Z9189 Other specified personal risk factors, not elsewhere classified: Secondary | ICD-10-CM

## 2022-09-28 DIAGNOSIS — Z6841 Body Mass Index (BMI) 40.0 and over, adult: Secondary | ICD-10-CM

## 2022-09-28 MED ORDER — SEMAGLUTIDE-WEIGHT MANAGEMENT 1 MG/0.5ML ~~LOC~~ SOAJ
1.0000 mg | SUBCUTANEOUS | 0 refills | Status: AC
Start: 2022-11-25 — End: 2022-12-23

## 2022-09-28 MED ORDER — ROSUVASTATIN CALCIUM 5 MG PO TABS
5.0000 mg | ORAL_TABLET | Freq: Every day | ORAL | 3 refills | Status: AC
Start: 2022-09-28 — End: ?

## 2022-09-28 MED ORDER — SEMAGLUTIDE-WEIGHT MANAGEMENT 2.4 MG/0.75ML ~~LOC~~ SOAJ: 2.4000 mg | SUBCUTANEOUS | 0 refills | Status: DC

## 2022-09-28 MED ORDER — SEMAGLUTIDE-WEIGHT MANAGEMENT 0.5 MG/0.5ML ~~LOC~~ SOAJ
0.5000 mg | SUBCUTANEOUS | 0 refills | Status: DC
Start: 2022-10-27 — End: 2022-11-02

## 2022-09-28 MED ORDER — SEMAGLUTIDE-WEIGHT MANAGEMENT 0.25 MG/0.5ML ~~LOC~~ SOAJ: 0.2500 mg | SUBCUTANEOUS | 0 refills | Status: AC

## 2022-09-28 MED ORDER — SEMAGLUTIDE-WEIGHT MANAGEMENT 1.7 MG/0.75ML ~~LOC~~ SOAJ: 1.7000 mg | SUBCUTANEOUS | 0 refills | Status: DC

## 2022-09-28 NOTE — Telephone Encounter (Signed)
Pharmacy Patient Advocate Encounter   Received notification from [facility] MEDICARE that prior authorization for [facility] is needed.    PA submitted on 09/28/22 Key [id number] Status is pending  Haze Rushing, CPhT Pharmacy Patient Advocate Specialist Direct Number: [phone] Fax: [phone]

## 2022-09-28 NOTE — Telephone Encounter (Signed)
Pharmacy Patient Advocate Encounter  Prior Authorization for [facility] has been approved.    Effective dates: 09/28/22 through 09/28/23  Haze Rushing, CPhT Pharmacy Patient Advocate Specialist Direct Number: [phone] Fax: [phone]

## 2022-09-28 NOTE — Progress Notes (Unsigned)
Office Visit    Patient Name: [name] Date of Encounter: 09/28/2022  PCP:  Eartha Inch, MD   [facility]  Cardiologist:  Armanda Magic, MD  Advanced Practice Provider:  No care team member to display Electrophysiologist:  None      Chief Complaint    [name] is a 77 y.o. male presents today for ICM follow up    Past Medical History    Past Medical History:  Diagnosis Date   Cancer [facility])    CHF (congestive heart failure) (HCC)    EF 45-50% on 08/2019 echo   Chronic kidney disease    RENAL STONES   Coronary artery disease    3 vessel disease: DES to RCA, DES to LCx 08/2019 and medical management   High frequency hearing loss    History of migraine headaches    Hx of colonic polyps    Hyperlipidemia    Hypertension    Obesity    Pulmonary hypertension (HCC)    Seborrheic dermatitis    Vitiligo    Past Surgical History:  Procedure Laterality Date   APPLICATION OF WOUND VAC Right 01/24/2021   Procedure: APPLICATION OF WOUND VAC;  Surgeon: Huel Cote, MD;  Location: [facility] OR;  Service: Orthopedics;  Laterality: Right;   COLON SURGERY  01/30/2001   CORONARY STENT INTERVENTION N/A 08/12/2019   Procedure: CORONARY STENT INTERVENTION;  Surgeon: Yvonne Kendall, MD;  Location: [facility] INVASIVE CV LAB;  Service: Cardiovascular;  Laterality: N/A;   CORONARY ULTRASOUND/IVUS N/A 08/12/2019   Procedure: Intravascular Ultrasound/IVUS;  Surgeon: Yvonne Kendall, MD;  Location: [facility] INVASIVE CV LAB;  Service: Cardiovascular;  Laterality: N/A;   CYSTOSCOPY N/A 02/03/2021   Procedure: CYSTOSCOPY, CLOT EVAUATION;  Surgeon: Heloise Purpura, MD;  Location: [facility] OR;  Service: Urology;  Laterality: N/A;   CYSTOSCOPY/URETEROSCOPY/HOLMIUM LASER/STENT PLACEMENT Left 02/04/2019   Procedure: CYSTOSCOPY Left retrograde and attempted left ureteroscopy;  Surgeon: Heloise Purpura, MD;  Location: [facility] ORS;  Service: Urology;  Laterality: Left;  ONLY NEEDS 60 MIN    CYSTOSCOPY/URETEROSCOPY/HOLMIUM LASER/STENT PLACEMENT Left 11/07/2019   Procedure: CYSTOSCOPY/URETEROSCOPY/HOLMIUM LASER/STENT PLACEMENT;  Surgeon: Heloise Purpura, MD;  Location: [facility] ORS;  Service: Urology;  Laterality: Left;   FASCIOTOMY Right 01/24/2021   Procedure: FASCIOTOMY RIGHT LEG;  Surgeon: Huel Cote, MD;  Location: [facility] OR;  Service: Orthopedics;  Laterality: Right;   FRACTURE SURGERY     I & D EXTREMITY Right 01/27/2021   Procedure: IRRIGATION AND DEBRIDEMENT RIGHT LOWER EXTREMITY;  Surgeon: Huel Cote, MD;  Location: [facility] OR;  Service: Orthopedics;  Laterality: Right;   IR CONVERT LEFT NEPHROSTOMY TO NEPHROURETERAL CATH  09/17/2019   IR NEPHROSTOMY PLACEMENT LEFT  02/05/2019   IR NEPHROSTOMY PLACEMENT LEFT  08/06/2019   LEFT HEART CATH AND CORONARY ANGIOGRAPHY N/A 08/09/2019   Procedure: LEFT HEART CATH AND CORONARY ANGIOGRAPHY;  Surgeon: Corky Crafts, MD;  Location: [facility] INVASIVE CV LAB;  Service: Cardiovascular;  Laterality: N/A;   SKIN BIOPSY     TONSILLECTOMY AND ADENOIDECTOMY      Allergies  Allergies  Allergen Reactions   Codeine     Bad Headache   Keflex [Cephalexin] Rash    History of Present Illness    [name] is a 77 y.o. male with a hx of CKD, obesity, ICM (prior LVEF 45-50%, echo 03/2020 with LVEF normalized 55-60%, CAD s/p PCI-pRCA and pLCx last seen 12/16/21.  Admitted 08/2019 urosepsis. Cardiac cath revealed severe three vessel CAD. PCI along with medical  management deemed best choice by surgical team. Underwent PCI and DES to pRCA and pLx.Sleep study mild OSA recommended for CPAP which he declined.   Hospitalized 9/25-10/13/22 for compartment syndrome after car door hit his leg which required fasciotomy.   Seen 12/16/21 noting DOE. Toprol resumed for GDMT of CAD. Rosuvastatin increased from 2.5mg  twice per week to daily. Subsequent echo 12/2021 LVEF  8/20210-75%, normal PASP, mild MR.  Presents today for follow up independently.  Pleasant gentleman who goes by "[name]" who is veteran and retired IT sales professional. Weight loss of 10 pounds over the last year. Interested in further weight loss. Notes deconditioning, there are 120 feet to mailbox - notes exertional dyspnea as he is worn out. Feels this is worse over the last year. Does resolve after 2-3 minutes of rest. No chest pain, pressure, tightness, edema, orthopnea, PND. Declines referral to physical therapy or ischemic evaluation.   09/06/22 Labs Total cholesterol 128, HDL 33.8, LDL 64, triglycerides 226 Hb 15.7, Plt 271, wbc 9.78 K 4.8, Na 140, Creatinine 1.31, GFR 56 ALT 33, AST 28 A1c 5.9  EKGs/Labs/Other Studies Reviewed:   The following studies were reviewed today: Cardiac Studies & Procedures   CARDIAC CATHETERIZATION  CARDIAC CATHETERIZATION 08/12/2019  Narrative Conclusions: 1. Severe three-vessel coronary artery disease, as documented on last weeks diagnostic catheterization. 2. Successful IVUS-guided PCI of the proximal LCx using Synergy 3.5 x 16 mm drug-eluting stent with 0% residual stenosis and TIMI-3 flow. 3. Successful IVUS-guided PCI to proximal RCA using Synergy 4.0 x 20 mm drug eluting stent with 0% residual stenosis and TIMI-3 flow.  Recommendations: 1. Dual antiplatelet therapy with aspirin and ticagrelor for at least 12 months, ideally longer.  If possible, DAPT should not be interrupted for at least 1 month unless there is life threatening bleeding or emergent surgery is needed. 2. Aggressive secondary prevention of coronary artery disease and medical management of chronically occluded LAD.  Yvonne Kendall, MD [facility]  Findings Coronary Findings Diagnostic  Dominance: Right  Left Anterior Descending Collaterals Dist LAD filled by collaterals from RPDA.  Mid LAD-1 lesion is 80% stenosed. Mid LAD-2 lesion is 100% stenosed. The lesion is chronically occluded.  Ramus Intermedius Ramus lesion is 75% stenosed.  Left  Circumflex Prox Cx lesion is 80% stenosed. Ultrasound (IVUS) was performed. Severe plaque burden was detected. IVUS has determined that the lesion is calcified.  Right Coronary Artery Prox RCA lesion is 90% stenosed. Mid RCA lesion is 25% stenosed.  Intervention  Prox Cx lesion Stent Lesion length:  14 mm. CATHETER LAUNCHER 70F EBU3.5 guide catheter was inserted. Lesion crossed with guidewire using a WIRE RUNTHROUGH .[id number]. Pre-stent angioplasty was performed using a BALLOON SAPPHIRE 3.0X12. Maximum pressure:  12 atm. A drug-eluting stent was successfully placed using a SYNERGY XD 3.50X16. Maximum pressure: 16 atm. Stent strut is well apposed. Post-stent angioplasty was performed using a BALLOON SAPPHIRE [place] [id number]. Maximum pressure:  22 atm. Post-Intervention Lesion Assessment The intervention was successful. Pre-interventional TIMI flow is 3. Post-intervention TIMI flow is 3. No complications occurred at this lesion. There is a 0% residual stenosis post intervention.  Prox RCA lesion Stent Lesion length:  18 mm. CATH VISTA GUIDE 6FR JR4 guide catheter was inserted. Lesion crossed with guidewire using a WIRE RUNTHROUGH .[id number]. Pre-stent angioplasty was performed using a BALLOON [place] 3.50X10. Maximum pressure:  12 atm. A drug-eluting stent was successfully placed using a SYNERGY XD 4.0X20. Maximum pressure: 16 atm. Stent strut is well apposed. Post-stent angioplasty was performed using a  BALLOON  [place] SAPPHIRE 4.5X18. Maximum pressure:  16 atm. Post-Intervention Lesion Assessment The intervention was successful. Pre-interventional TIMI flow is 3. Post-intervention TIMI flow is 3. No complications occurred at this lesion. There is a 0% residual stenosis post intervention.   CARDIAC CATHETERIZATION 08/09/2019  Narrative  Ramus lesion is 75% stenosed.  Mid LAD-1 lesion is 80% stenosed.  Mid LAD-2 lesion is 99% stenosed.  Prox RCA lesion is 90% stenosed.  Mid RCA lesion is 25%  stenosed.  Prox Cx lesion is 80% stenosed.  LV end diastolic pressure is moderately elevated.  There is no aortic valve stenosis.  Severe three vessel disease.  Will get cardiac surgery opinion.  Diffuse distal vessel disease as well.  His RCA and circumflex are treatable percutaneously.  LAD appears to be a chronic occlusion and may not have a good target for LIMA graft.  He has significant other medical issues as well.  Discussed with Dr. Laverle Patter.  If he is not a candidate for CABG and if PCI done, urology could wait 30 days post procedure to perform urologic surgery.  Findings Coronary Findings Diagnostic  Dominance: Right  Left Anterior Descending Collaterals Dist LAD filled by collaterals from RPDA.  Mid LAD-1 lesion is 80% stenosed. Mid LAD-2 lesion is 99% stenosed.  Ramus Intermedius Ramus lesion is 75% stenosed.  Left Circumflex Prox Cx lesion is 80% stenosed.  Right Coronary Artery Prox RCA lesion is 90% stenosed. Mid RCA lesion is 25% stenosed.  Intervention  No interventions have been documented.     ECHOCARDIOGRAM  ECHOCARDIOGRAM COMPLETE 12/31/2021  Narrative ECHOCARDIOGRAM REPORT    Patient Name:   [name] Date of Exam: 12/31/2021 Medical Rec #:  [id number]         Height:       68.0 in Accession #:    [id number]        Weight:       291.0 lb Date of Birth:  [birth year]         BSA:          2.397 m Patient Age:    76 years          BP:           138/78 mmHg Patient Gender: M                 HR:           68 bpm. Exam Location:  [address]  Procedure: 2D Echo, 3D Echo, Cardiac Doppler and Color Doppler  Indications:    R06.00 Dyspnea  History:        Patient has prior history of Echocardiogram examinations, most recent 03/05/2020. CHF, CAD and Previous Myocardial Infarction, Pulmonary HTN, Signs/Symptoms:Dyspnea; Risk Factors:Hypertension, Dyslipidemia and Former Smoker. Ischemic Cardiomyopathy, Morbid Obesity.  Sonographer:     Farrel Conners RDCS Referring Phys: Kayleen Memos BADGER  IMPRESSIONS   1. Left ventricular ejection fraction, by estimation, is 70 to 75%. The left ventricle has hyperdynamic function. The left ventricle has no regional wall motion abnormalities. Left ventricular diastolic parameters were normal. 2. Right ventricular systolic function is normal. The right ventricular size is normal. There is normal pulmonary artery systolic pressure. 3. Mild mitral valve regurgitation.  FINDINGS Left Ventricle: Left ventricular ejection fraction, by estimation, is 70 to 75%. The left ventricle has hyperdynamic function. The left ventricle has no regional wall motion abnormalities. The left ventricular internal cavity size was normal in size. There is no left ventricular hypertrophy. Left ventricular  diastolic parameters were normal.  Right Ventricle: The right ventricular size is normal. Right vetricular wall thickness was not assessed. Right ventricular systolic function is normal. There is normal pulmonary artery systolic pressure. The tricuspid regurgitant velocity is 2.74 m/s, and with an assumed right atrial pressure of 3 mmHg, the estimated right ventricular systolic pressure is 33.0 mmHg.  Left Atrium: Left atrial size was normal in size.  Right Atrium: Right atrial size was normal in size.  Pericardium: There is no evidence of pericardial effusion.  Mitral Valve: The mitral valve is normal in structure. Mild mitral valve regurgitation.  Tricuspid Valve: The tricuspid valve is normal in structure. Tricuspid valve regurgitation is mild.  Aortic Valve: The aortic valve is tricuspid. Aortic valve regurgitation is not visualized.  Pulmonic Valve: The pulmonic valve was normal in structure. Pulmonic valve regurgitation is not visualized.  Aorta: The aortic root and ascending aorta are structurally normal, with no evidence of dilitation.  IAS/Shunts: No atrial level shunt detected by color flow  Doppler.   LEFT VENTRICLE PLAX 2D LVIDd:         4.00 cm   Diastology LVIDs:         2.35 cm   LV e' medial:    7.83 cm/s LV PW:         0.75 cm   LV E/e' medial:  12.1 LV IVS:        0.85 cm   LV e' lateral:   11.00 cm/s LVOT diam:     2.50 cm   LV E/e' lateral: 8.6 LV SV:         109 LV SV Index:   46 LVOT Area:     4.91 cm  3D Volume EF: 3D EF:        67 % LV EDV:       144 ml LV ESV:       48 ml LV SV:        96 ml  RIGHT VENTRICLE RV Basal diam:  3.30 cm RV S prime:     15.20 cm/s TAPSE (M-mode): 2.8 cm  LEFT ATRIUM             Index        RIGHT ATRIUM           Index LA diam:        4.50 cm 1.88 cm/m   RA Area:     12.90 cm LA Vol (A2C):   51.5 ml 21.49 ml/m  RA Volume:   30.90 ml  12.89 ml/m LA Vol (A4C):   62.2 ml 25.95 ml/m LA Biplane Vol: 58.5 ml 24.41 ml/m AORTIC VALVE LVOT Vmax:   113.00 cm/s LVOT Vmean:  72.650 cm/s LVOT VTI:    0.222 m  AORTA Ao Root diam: 3.20 cm Ao Asc diam:  3.60 cm  MITRAL VALVE                TRICUSPID VALVE MV Area (PHT)  cm          TR Peak grad:   30.0 mmHg MV Decel Time: 239 msec     TR Vmax:        274.00 cm/s MV E velocity: 94.40 cm/s MV A velocity: 108.25 cm/s  SHUNTS MV E/A ratio:  0.87         Systemic VTI:  0.22 m Systemic Diam: 2.50 cm  Dietrich Pates MD Electronically signed by Dietrich Pates MD Signature Date/Time: 12/31/2021/2:10:46 PM  Final              EKG:  EKG is ordered today.  The ekg ordered today demonstrates NSR 70 bpm with no acute ST/T wave changes.   Recent Labs: No results found for requested labs within last 365 days.  Recent Lipid Panel    Component Value Date/Time   CHOL 106 08/10/2020 0731   TRIG 96 08/10/2020 0731   HDL 28 (L) 08/10/2020 0731   CHOLHDL 3.8 08/10/2020 0731   CHOLHDL 9.0 08/09/2019 0521   VLDL 47 (H) 08/09/2019 0521   LDLCALC 59 08/10/2020 0731    Home Medications   Current Meds  Medication Sig   aspirin EC 81 MG EC tablet Take 1 tablet (81 mg total) by  mouth daily.   ezetimibe (ZETIA) 10 MG tablet TAKE 1 TABLET(10 MG) BY MOUTH DAILY   finasteride (PROSCAR) 5 MG tablet Take 1 tablet (5 mg total) by mouth daily.   hydrocortisone (ANUSOL-HC) 2.5 % rectal cream Place rectally 3 (three) times daily.   metoprolol succinate (TOPROL-XL) 25 MG 24 hr tablet Take 12.5 mg by mouth daily.   Omega-3 Fatty Acids (FISH OIL) 1000 MG CPDR Take 2,000 mg by mouth daily.   rosuvastatin (CRESTOR) 5 MG tablet Take 1 tablet (5 mg total) by mouth 2 (two) times a week. Pt. needs to make appt. with Dr. Mayford Knife in order to receive further refills. Thank You. 1st Attempt.   silver sulfADIAZINE (SILVADENE) 1 % cream Apply 1 application topically daily.   [DISCONTINUED] metoprolol succinate (TOPROL-XL) 25 MG 24 hr tablet Take 1 tablet (25 mg total) by mouth daily. Take with or immediately following a meal.     Review of Systems      All other systems reviewed and are otherwise negative except as noted above.  Physical Exam    VS:  BP 132/86   Pulse 70   Ht 5\' 8"  (1.727 m)   Wt 281 lb (127.5 kg)   BMI 42.73 kg/m  , BMI Body mass index is 42.73 kg/m.  Wt Readings from Last 3 Encounters:  09/28/22 281 lb (127.5 kg)  12/16/21 291 lb (132 kg)  04/09/21 264 lb (119.7 kg)     GEN: Well nourished, overweight, well developed, in no acute distress. HEENT: normal. Neck: Supple, no JVD, carotid bruits, or masses. Cardiac: RRR, no murmurs, rubs, or gallops. No clubbing, cyanosis, edema.  Radials/PT 2+ and equal bilaterally.  Respiratory:  Respirations regular and unlabored, clear to auscultation bilaterally. GI: Soft, nontender, nondistended. MS: No deformity or atrophy. Skin: Warm and dry, no rash. Neuro:  Strength and sensation are intact. Psych: Normal affect.  Assessment & Plan    CAD / HLD LDL goal <70- Notes exertional dyspnea which he attributes to deconditioning, weight but no chest pain. EKG NSR no acute changes. Discussed possible ischemic eval but as  symptoms are mild he politely declines. GDMT Aspirin, Rosuvastatin, Zetia, Metoprolol. Recommend aiming for 150 minutes of moderate intensity activity per week and following a heart healthy diet.   Tolerating Rosuvastatin 5mg  QD (previously only on 2.5mg ) which we will continue. He will contact us if he has recurrent myalgias.   Morbid obesity / BMI 42 - Weight loss via diet and exercise encouraged. Discussed the impact being overweight would have on cardiovascular risk. Plan to Rx Wegovy due to cardiovascular risk reduction benefit.           Disposition: Follow up in 3 month(s) with Armanda Magic, MD or  APP.  Signed, Alver Sorrow, NP 09/28/2022, 9:08 AM [facility]

## 2022-09-28 NOTE — Patient Instructions (Addendum)
Medication Instructions:   Your physician has recommended you make the following change in your medication:   Increase Rosuvastatin (Crestor) to 5mg  (whole tablet) daily If you notice any muscle pain, okay to reduce to the 2.5mg  (half tablet) dose  You have been prescribed a GLP-1 today called Wegovy.  This is a once weekly injection that helps you lose weight and maintain weight loss.  This medication works best when combined with healthy diet and regular exercise.  GLP1 medications help you lose weight by reducing appetite and helping you feel fuller longer. Most common side effects include nausea, vomiting, diarrhea. These side effects can be limited by eating slowly and eating small meals and often improve after a few days. We have Sent a prescription and are awaiting prior authorization from insurance.   *If you need a refill on your cardiac medications before your next appointment, please call your pharmacy*  Testing/Procedures: Your EKG today shows normal sinus rhythm which is a good result!   Follow-Up: At [facility], you and your health needs are our priority.  As part of our continuing mission to provide you with exceptional heart care, we have created designated Provider Care Teams.  These Care Teams include your primary Cardiologist (physician) and Advanced Practice Providers (APPs -  Physician Assistants and Nurse Practitioners) who all work together to provide you with the care you need, when you need it.  We recommend signing up for the patient portal called "MyChart".  Sign up information is provided on this After Visit Summary.  MyChart is used to connect with patients for Virtual Visits (Telemedicine).  Patients are able to view lab/test results, encounter notes, upcoming appointments, etc.  Non-urgent messages can be sent to your provider as well.   To learn more about what you can do with MyChart, go to [email or web address].    Your next appointment:   3  month(s)  Provider:   Armanda Magic, MD  or Alver Sorrow, NP    Other Instructions  Heart Healthy Diet Recommendations: A low-salt diet is recommended. Meats should be grilled, baked, or boiled. Avoid fried foods. Focus on lean protein sources like fish or chicken with vegetables and fruits. The American Heart Association is a Chief Technology Officer!  American Heart Association Diet and Lifeystyle Recommendations   Exercise recommendations: The American Heart Association recommends 150 minutes of moderate intensity exercise weekly. Try 30 minutes of moderate intensity exercise 4-5 times per week. This could include walking, jogging, or swimming.

## 2022-11-02 ENCOUNTER — Other Ambulatory Visit (HOSPITAL_BASED_OUTPATIENT_CLINIC_OR_DEPARTMENT_OTHER): Payer: Self-pay | Admitting: Family

## 2022-11-02 DIAGNOSIS — I25118 Atherosclerotic heart disease of native coronary artery with other forms of angina pectoris: Secondary | ICD-10-CM

## 2022-11-02 DIAGNOSIS — Z9189 Other specified personal risk factors, not elsewhere classified: Secondary | ICD-10-CM

## 2022-11-02 DIAGNOSIS — Z6841 Body Mass Index (BMI) 40.0 and over, adult: Secondary | ICD-10-CM

## 2022-11-04 ENCOUNTER — Other Ambulatory Visit: Payer: Self-pay | Admitting: *Deleted

## 2022-11-04 DIAGNOSIS — Z9889 Other specified postprocedural states: Secondary | ICD-10-CM

## 2022-11-04 DIAGNOSIS — R600 Localized edema: Secondary | ICD-10-CM

## 2022-11-17 NOTE — Progress Notes (Deleted)
Office Note      Requesting Provider:  Huel Cote MD  HPI: [name] is a 77 y.o. (1945/09/03) male presenting at the request of Dr. Huel Cote for slow wound healing.  Pt was on dual antiplatelet therapy and injured his calf from a car door. This resulted in 4 compartment fasciotomy 01/27/21, with subsequent skin grafting.   On exam today, [name] was doing well.  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  Clearance is independent, and moderately ambulatory.  This is limited due to his size.  He denies claudication, rest pain, tissue loss at the feet.  He feels as though his right fasciotomy wound is healing slowly and is performing daily wound care.  The pt is  on a statin for cholesterol management.  The pt is  on a daily aspirin.   Other AC:  - The pt is  on medication for hypertension.   The pt is not diabetic.  Tobacco hx:  former  Past Medical History:  Diagnosis Date   Cancer (HCC)    CHF (congestive heart failure) (HCC)    EF 45-50% on 08/2019 echo   Chronic kidney disease    RENAL STONES   Coronary artery disease    3 vessel disease: DES to RCA, DES to LCx 08/2019 and medical management   High frequency hearing loss    History of migraine headaches    Hx of colonic polyps    Hyperlipidemia    Hypertension    Obesity    Pulmonary hypertension (HCC)    Seborrheic dermatitis    Vitiligo     Past Surgical History:  Procedure Laterality Date   APPLICATION OF WOUND VAC Right 01/24/2021   Procedure: APPLICATION OF WOUND VAC;  Surgeon: Huel Cote, MD;  Location: [facility];  Service: Orthopedics;  Laterality: Right;   COLON SURGERY  01/30/2001   CORONARY STENT INTERVENTION N/A 08/12/2019   Procedure: CORONARY STENT INTERVENTION;  Surgeon: Yvonne Kendall, MD;  [facility]: [facility] IN[facility];  Service: Cardiovascular;  Laterality: N/A;    CORONARY ULTRASOUND/IVUS N/A 08/12/2019   Procedure: Intravascular Ultrasound/IVUS;  Surgeon: Yvonne Kendall, MD;  [facility]: [facility];  Service: Cardiovascular;  Laterality: N/A;   CYSTOSCOPY N/A 02/03/2021   Procedure: CYSTOSCOPY, CLOT EVAUATION;  Surgeon: Heloise Purpura, MD;  Location: [facility] OR;  Service: Urology;  Laterality: N/A;   CYSTOSCOPY/URETEROSCOPY/HOLMIUM LASER/STENT PLACEMENT Left 02/04/2019   Procedure: CYSTOSCOPY Left retrograde and attempted left ureteroscopy;  Surgeon: Heloise Purpura, MD;  Location: [facility]S;  Service: Urology;  Laterality: Left;  ONLY NEEDS 60 MIN   CYSTOSCOPY/URETEROSCOPY/HOLMIUM LASER/STENT PLACEMENT Left 11/07/2019   Procedure: CYSTOSCOPY/URETEROSCOPY/HOLMIUM LASER/STENT PLACEMENT;  Surgeon: Heloise Purpura, MD;  Location: [facility];  Service: Urology;  Laterality: Left;   FASCIOTOMY Right 01/24/2021   Procedure: FASCIOTOMY RIGHT LEG;  Surgeon: Huel Cote, MD;  [facility];  Service: Orthopedics;  Laterality: Right;   FRACTURE SURGERY     I & D EXTREMITY Right 01/27/2021   Procedure: IRRIGATION AND DEBRIDEMENT RIGHT LOWER EXTREMITY;  Surgeon: Huel Cote, MD;  Location: [facility];  Service: Orthopedics;  Laterality: Right;   IR CONVERT LEFT NEPHROSTOMY TO NEPHROURETERAL CATH  09/17/2019   IR NEPHROSTOMY PLACEMENT LEFT  02/05/2019   IR NEPHROSTOMY PLACEMENT LEFT  08/06/2019   LEFT HEART CATH AND CORONARY ANGIOGRAPHY N/A 08/09/2019   Procedure: LEFT HEART CATH AND  CORONARY ANGIOGRAPHY;  Surgeon: Corky Crafts, MD;  Location: [facility] INVASIVE CV LAB;  Service: Cardiovascular;  Laterality: N/A;   SKIN BIOPSY     TONSILLECTOMY AND ADENOIDECTOMY      Social History   Socioeconomic History   Marital status: Married    Spouse name: Not on file   Number of children: Not on file   Years of education: Not on file   Highest education level: Not on file  Occupational History   Not on file  Tobacco Use   Smoking status: Former    Current packs/day:  0.00    Average packs/day: 1 pack/day for 40.0 years (40.0 ttl pk-yrs)    Types: Cigarettes    Start date: 5    Quit date: 21    Years since quitting: 27.5   Smokeless tobacco: Never  Vaping Use   Vaping status: Never Used  Substance and Sexual Activity   Alcohol use: Not Currently   Drug use: Never   Sexual activity: Not on file  Other Topics Concern   Not on file  Social History Narrative   Not on file   Social Determinants of Health   Financial Resource Strain: Medium Risk (08/16/2022)   Received from [facility], [facility]   Overall Financial Resource Strain (CARDIA)    Difficulty of Paying Living Expenses: Somewhat hard  Food Insecurity: No Food Insecurity (08/16/2022)   Received from [facility], [facility]   Hunger Vital Sign    Worried About Running Out of Food in the Last Year: Never true    Ran Out of Food in the Last Year: Never true  Transportation Needs: No Transportation Needs (08/16/2022)   Received from [facility], [facility]   PRAPARE - Transportation    Lack of Transportation (Medical): No    Lack of Transportation (Non-Medical): No  Physical Activity: Unknown (08/16/2022)   Received from [facility], [facility]   Exercise Vital Sign    Days of Exercise per Week: 0 days    Minutes of Exercise per Session: Not on file  Stress: No Stress Concern Present (08/16/2022)   Received from [facility], [facility] of Occupational Health - Occupational Stress Questionnaire    Feeling of Stress : Only a little  Social Connections: Moderately Integrated (08/16/2022)   Received from [facility], [facility]   Social Network    How would you rate your social network (family, work, friends)?: Adequate participation with social networks  Intimate Partner Violence: Not At Risk (08/16/2022)   Received from [facility], [facility]   HITS    Over the last 12 months how often did your partner physically hurt you?: 1     Over the last 12 months how often did your partner insult you or talk down to you?: 1    Over the last 12 months how often did your partner threaten you with physical harm?: 1    Over the last 12 months how often did your partner scream or curse at you?: 1   No family history on file.  Current Outpatient Medications  Medication Sig Dispense Refill   aspirin EC 81 MG EC tablet Take 1 tablet (81 mg total) by mouth daily. 30 tablet 11   ezetimibe (ZETIA) 10 MG tablet TAKE 1 TABLET(10 MG) BY MOUTH DAILY 90 tablet 2   finasteride (PROSCAR) 5 MG tablet Take 1 tablet (5 mg total) by mouth daily. 30 tablet 3   hydrocortisone (ANUSOL-HC)  2.5 % rectal cream Place rectally 3 (three) times daily. 30 g 0   metoprolol succinate (TOPROL-XL) 25 MG 24 hr tablet Take 12.5 mg by mouth daily.     nitroGLYCERIN (NITROSTAT) 0.4 MG SL tablet Place 1 tablet (0.4 mg total) under the tongue every 5 (five) minutes as needed for chest pain. 25 tablet 3   Omega-3 Fatty Acids (FISH OIL) 1000 MG CPDR Take 2,000 mg by mouth daily.     rosuvastatin (CRESTOR) 5 MG tablet Take 1 tablet (5 mg total) by mouth daily. 90 tablet 3   Semaglutide-Weight Management (WEGOVY) 0.5 MG/0.5ML SOAJ Inject 0.5 mg into the skin once a week. 2 mL 0   [START ON 11/25/2022] Semaglutide-Weight Management 1 MG/0.5ML SOAJ Inject 1 mg into the skin once a week for 28 days. 2 mL 0   [START ON 12/24/2022] Semaglutide-Weight Management 1.7 MG/0.75ML SOAJ Inject 1.7 mg into the skin once a week for 28 days. 3 mL 0   [START ON 01/22/2023] Semaglutide-Weight Management 2.4 MG/0.75ML SOAJ Inject 2.4 mg into the skin once a week for 28 days. 3 mL 0   silver sulfADIAZINE (SILVADENE) 1 % cream Apply 1 application topically daily. 50 g 0   No current facility-administered medications for this visit.    Allergies  Allergen Reactions   Codeine     Bad Headache   Keflex [Cephalexin] Rash     REVIEW OF SYSTEMS:   [X]  denotes positive finding, [ ]  denotes  negative finding Cardiac  Comments:  Chest pain or chest pressure:    Shortness of breath upon exertion:    Short of breath when lying flat:    Irregular heart rhythm:        Vascular    Pain in calf, thigh, or hip brought on by ambulation:    Pain in feet at night that wakes you up from your sleep:     Blood clot in your veins:    Leg swelling:         Pulmonary    Oxygen at home:    Productive cough:     Wheezing:         Neurologic    Sudden weakness in arms or legs:     Sudden numbness in arms or legs:     Sudden onset of difficulty speaking or slurred speech:    Temporary loss of vision in one eye:     Problems with dizziness:         Gastrointestinal    Blood in stool:     Vomited blood:         Genitourinary    Burning when urinating:     Blood in urine:        Psychiatric    Major depression:         Hematologic    Bleeding problems:    Problems with blood clotting too easily:        Skin    Rashes or ulcers:        Constitutional    Fever or chills:      PHYSICAL EXAMINATION:  There were no vitals filed for this visit.  General:  WDWN in NAD; vital signs documented above Gait: Not observed HENT: WNL, normocephalic Pulmonary: normal non-labored breathing , without wheezing Cardiac: regular HR, Abdomen: soft, NT, no masses Skin: without rashes Vascular Exam/Pulses:  Right Left  Radial 2+ (normal) 2+ (normal)  Ulnar 2+ (normal) 2+ (normal)  DP 2+ (normal) 2+ (normal)       Extremities: without ischemic changes, without Gangrene , without cellulitis; The right-sided fasciotomy wound is closed with granulation tissue rising out of the wound bed. Musculoskeletal: no muscle wasting or atrophy  Neurologic: A&O X 3;  No focal weakness or paresthesias are detected Psychiatric:  The pt has Normal affect.   Non-Invasive Vascular Imaging:       ASSESSMENT/PLAN: Juandaniel Manfredo is a 77 y.o. male presenting for evaluation of slowly  healing right leg fasciotomy wound.  This is healed with granulation tissue rising out of the wound bed.  On examination, there were 3 staples still present, I remove these in the office today.  The patient's ABI was independently reviewed demonstrating normal arterial blood flow in bilateral feet.  He does have signs of mild venous insufficiency bilaterally.  With history of right-sided 4 compartment fasciotomy, this is magnified in the right leg. 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  He would benefit from compression stockings on a daily basis and exercise.  Compression stockings should not inhibit his wound healing.  Should he become symptomatic with new onset leg heaviness by days end, or develop new wounds on his lower legs, I would like to see him back in the office for venous insufficiency work-up.   Victorino Sparrow, MD [facility] [phone]

## 2022-11-18 ENCOUNTER — Ambulatory Visit (HOSPITAL_COMMUNITY): Payer: Medicare Other

## 2022-11-18 ENCOUNTER — Ambulatory Visit: Payer: Medicare Other | Admitting: Vascular Surgery

## 2022-11-25 ENCOUNTER — Ambulatory Visit (HOSPITAL_COMMUNITY)
Admission: RE | Admit: 2022-11-25 | Discharge: 2022-11-25 | Disposition: A | Discharge status: Home / Self Care | Payer: Medicare Other | Source: Ambulatory Visit | Attending: Vascular Surgery | Admitting: Vascular Surgery

## 2022-11-25 ENCOUNTER — Encounter: Payer: Self-pay | Admitting: Vascular Surgery

## 2022-11-25 ENCOUNTER — Ambulatory Visit: Payer: Medicare Other | Admitting: Vascular Surgery

## 2022-11-25 VITALS — BP 146/83 | HR 71 | Temp 98.2°F | Resp 20 | Ht 68.0 in | Wt 279.0 lb

## 2022-11-25 DIAGNOSIS — R600 Localized edema: Secondary | ICD-10-CM | POA: Insufficient documentation

## 2022-11-25 DIAGNOSIS — S81802D Unspecified open wound, left lower leg, subsequent encounter: Secondary | ICD-10-CM

## 2022-11-25 DIAGNOSIS — Z9889 Other specified postprocedural states: Secondary | ICD-10-CM | POA: Insufficient documentation

## 2022-11-25 NOTE — Progress Notes (Signed)
Office Note    HPI: [name] is a 77 y.o. (January 13, 1946) male presenting at the request of Dr. Huel Cote for slow wound healing.  [name] was originally seen in my office at the end of 2022 and noted to have injured his calf from a car door.  This resulted in 4 compartment fasciotomy on 01/27/2021 with subsequent skin grafting.  He has had difficulty with wound healing since that time.  ABI demonstrated normal perfusion bilaterally.  He presents today to discuss possible venous insufficiency as an etiology for poor wound healing.  On exam today, [name] was doing well.  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  [name] is independent, and moderately ambulatory.  This is limited due to his size.  He denies claudication, rest pain, tissue loss at the feet.    [name] states healing has stagnated, and even worsened.  He continues to moisturize the area with lotion.  He denies cellulitis, but does note some weeping from the anterior portion of his lower leg.  [name] spends most of his day sitting around his house.  He does not use compression stockings.  The pt is  on a statin for cholesterol management.  The pt is  on a daily aspirin.   Other AC:  - The pt is  on medication for hypertension.   The pt is not diabetic.  Tobacco hx:  former  Past Medical History:  Diagnosis Date   Cancer (HCC)    CHF (congestive heart failure) (HCC)    EF 45-50% on 08/2019 echo   Chronic kidney disease    RENAL STONES   Coronary artery disease    3 vessel disease: DES to RCA, DES to LCx 08/2019 and medical management   High frequency hearing loss    History of migraine headaches    Hx of colonic polyps    Hyperlipidemia    Hypertension    Obesity    Pulmonary hypertension (HCC)    Seborrheic dermatitis    Vitiligo     Past Surgical History:  Procedure Laterality  Date   APPLICATION OF WOUND VAC Right 01/24/2021   Procedure: APPLICATION OF WOUND VAC;  Surgeon: Huel Cote, MD;  Location: [facility] OR;  Service: Orthopedics;  Laterality: Right;   COLON SURGERY  01/30/2001   CORONARY STENT INTERVENTION N/A 08/12/2019   Procedure: CORONARY STENT INTERVENTION;  Surgeon: Yvonne Kendall, MD;  [facility]: [facility] INV[facility] LAB;  Service: Cardiovascular;  Laterality: N/A;   CORONARY ULTRASOUND/IVUS N/A 08/12/2019   Procedure: Intravascular Ultrasound/IVUS;  Surgeon: Yvonne Kendall, MD;  [facility]: [facility];  Service: Cardiovascular;  Laterality: N/A;   CYSTOSCOPY N/A 02/03/2021   Procedure: CYSTOSCOPY, CLOT EVAUATION;  Surgeon: Heloise Purpura, MD;  Location: [facility] OR;  Service: Urology;  Laterality: N/A;   CYSTOSCOPY/URETEROSCOPY/HOLMIUM LASER/STENT PLACEMENT Left 02/04/2019   Procedure: CYSTOSCOPY Left retrograde and attempted left ureteroscopy;  Surgeon: Heloise Purpura, MD;  Location: [facility];  Service: Urology;  Laterality: Left;  ONLY NEEDS 60 MIN   CYSTOSCOPY/URETEROSCOPY/HOLMIUM LASER/STENT PLACEMENT Left 11/07/2019   Procedure: CYSTOSCOPY/URETEROSCOPY/HOLMIUM LASER/STENT PLACEMENT;  Surgeon: Heloise Purpura, MD;  Location: [facility];  Service: Urology;  Laterality: Left;   FASCIOTOMY Right 01/24/2021   Procedure: FASCIOTOMY RIGHT LEG;  Surgeon: Huel Cote, MD;  Location[facility];  Service: Orthopedics;  Laterality: Right;   FRACTURE SURGERY     I & D  EXTREMITY Right 01/27/2021   Procedure: IRRIGATION AND DEBRIDEMENT RIGHT LOWER EXTREMITY;  Surgeon: Huel Cote, MD;  Location: [facility];  Service: Orthopedics;  Laterality: Right;   IR CONVERT LEFT NEPHROSTOMY TO NEPHROURETERAL CATH  09/17/2019   IR NEPHROSTOMY PLACEMENT LEFT  02/05/2019   IR NEPHROSTOMY PLACEMENT LEFT  08/06/2019   LEFT HEART CATH AND CORONARY ANGIOGRAPHY N/A 08/09/2019   Procedure: LEFT HEART CATH AND CORONARY ANGIOGRAPHY;  Surgeon: Corky Crafts, MD;  Location: [facility] INVASIVE CV LAB;   Service: Cardiovascular;  Laterality: N/A;   SKIN BIOPSY     TONSILLECTOMY AND ADENOIDECTOMY      Social History   Socioeconomic History   Marital status: Married    Spouse name: Not on file   Number of children: Not on file   Years of education: Not on file   Highest education level: Not on file  Occupational History   Not on file  Tobacco Use   Smoking status: Former    Current packs/day: 0.00    Average packs/day: 1 pack/day for 40.0 years (40.0 ttl pk-yrs)    Types: Cigarettes    Start date: 27    Quit date: 44    Years since quitting: 27.5   Smokeless tobacco: Never  Vaping Use   Vaping status: Never Used  Substance and Sexual Activity   Alcohol use: Not Currently   Drug use: Never   Sexual activity: Not on file  Other Topics Concern   Not on file  Social History Narrative   Not on file   Social Determinants of Health   Financial Resource Strain: Medium Risk (08/16/2022)   Received from [facility], [facility]   Overall Financial Resource Strain (CARDIA)    Difficulty of Paying Living Expenses: Somewhat hard  Food Insecurity: No Food Insecurity (08/16/2022)   Received from [facility], [facility]   Hunger Vital Sign    Worried About Running Out of Food in the Last Year: Never true    Ran Out of Food in the Last Year: Never true  Transportation Needs: No Transportation Needs (08/16/2022)   Received from [facility], [facility]   PRAPARE - Transportation    Lack of Transportation (Medical): No    Lack of Transportation (Non-Medical): No  Physical Activity: Unknown (08/16/2022)   Received from [facility], [facility]   Exercise Vital Sign    Days of Exercise per Week: 0 days    Minutes of Exercise per Session: Not on file  Stress: No Stress Concern Present (08/16/2022)   Received from [facility], [facility] of Occupational Health - Occupational Stress Questionnaire    Feeling of Stress : Only a little  Social  Connections: Moderately Integrated (08/16/2022)   Received from [facility], [facility]   Social Network    How would you rate your social network (family, work, friends)?: Adequate participation with social networks  Intimate Partner Violence: Not At Risk (08/16/2022)   Received from [facility], [facility]   HITS    Over the last 12 months how often did your partner physically hurt you?: 1    Over the last 12 months how often did your partner insult you or talk down to you?: 1    Over the last 12 months how often did your partner threaten you with physical harm?: 1    Over the last 12 months how often did your partner scream or curse at you?: 1   History reviewed. No  pertinent family history.  Current Outpatient Medications  Medication Sig Dispense Refill   aspirin EC 81 MG EC tablet Take 1 tablet (81 mg total) by mouth daily. 30 tablet 11   ezetimibe (ZETIA) 10 MG tablet TAKE 1 TABLET(10 MG) BY MOUTH DAILY 90 tablet 2   finasteride (PROSCAR) 5 MG tablet Take 1 tablet (5 mg total) by mouth daily. 30 tablet 3   hydrocortisone (ANUSOL-HC) 2.5 % rectal cream Place rectally 3 (three) times daily. 30 g 0   metoprolol succinate (TOPROL-XL) 25 MG 24 hr tablet Take 12.5 mg by mouth daily.     Omega-3 Fatty Acids (FISH OIL) 1000 MG CPDR Take 2,000 mg by mouth daily.     rosuvastatin (CRESTOR) 5 MG tablet Take 1 tablet (5 mg total) by mouth daily. 90 tablet 3   Semaglutide-Weight Management (WEGOVY) 0.5 MG/0.5ML SOAJ Inject 0.5 mg into the skin once a week. 2 mL 0   Semaglutide-Weight Management 1 MG/0.5ML SOAJ Inject 1 mg into the skin once a week for 28 days. 2 mL 0   [START ON 12/24/2022] Semaglutide-Weight Management 1.7 MG/0.75ML SOAJ Inject 1.7 mg into the skin once a week for 28 days. 3 mL 0   [START ON 01/22/2023] Semaglutide-Weight Management 2.4 MG/0.75ML SOAJ Inject 2.4 mg into the skin once a week for 28 days. 3 mL 0   silver sulfADIAZINE (SILVADENE) 1 % cream Apply 1 application  topically daily. 50 g 0   nitroGLYCERIN (NITROSTAT) 0.4 MG SL tablet Place 1 tablet (0.4 mg total) under the tongue every 5 (five) minutes as needed for chest pain. 25 tablet 3   No current facility-administered medications for this visit.    Allergies  Allergen Reactions   Codeine     Bad Headache   Keflex [Cephalexin] Rash     REVIEW OF SYSTEMS:   [X]  denotes positive finding, [ ]  denotes negative finding Cardiac  Comments:  Chest pain or chest pressure:    Shortness of breath upon exertion:    Short of breath when lying flat:    Irregular heart rhythm:        Vascular    Pain in calf, thigh, or hip brought on by ambulation:    Pain in feet at night that wakes you up from your sleep:     Blood clot in your veins:    Leg swelling:         Pulmonary    Oxygen at home:    Productive cough:     Wheezing:         Neurologic    Sudden weakness in arms or legs:     Sudden numbness in arms or legs:     Sudden onset of difficulty speaking or slurred speech:    Temporary loss of vision in one eye:     Problems with dizziness:         Gastrointestinal    Blood in stool:     Vomited blood:         Genitourinary    Burning when urinating:     Blood in urine:        Psychiatric    Major depression:         Hematologic    Bleeding problems:    Problems with blood clotting too easily:        Skin    Rashes or ulcers:        Constitutional    Fever or chills:  PHYSICAL EXAMINATION:  Vitals:   11/25/22 1453  BP: (!) 146/83  Pulse: 71  Resp: 20  Temp: 98.2 F (36.8 C)  SpO2: 95%  Weight: 279 lb (126.6 kg)  Height: 5\' 8"  (1.727 m)    General:  WDWN in NAD; vital signs documented above Gait: Not observed HENT: WNL, normocephalic Pulmonary: normal non-labored breathing , without wheezing Cardiac: regular HR, Abdomen: soft, NT, no masses Skin: without rashes Vascular Exam/Pulses:  Right Left  Radial 2+ (normal) 2+ (normal)  Ulnar 2+ (normal) 2+  (normal)          DP 2+ (normal) 2+ (normal)       Extremities: without ischemic changes, without Gangrene , without cellulitis; The right-sided fasciotomy wound is closed with granulation tissue rising out of the wound bed. Musculoskeletal: no muscle wasting or atrophy  Neurologic: A&O X 3;  No focal weakness or paresthesias are detected Psychiatric:  The pt has Normal affect.   Non-Invasive Vascular Imaging:      Venous Reflux Times  +--------------+---------+------+-----------+------------+--------+  RIGHT        Reflux NoRefluxReflux TimeDiameter cmsComments                          Yes                                   +--------------+---------+------+-----------+------------+--------+  CFV          no                                              +--------------+---------+------+-----------+------------+--------+  FV mid        no                                              +--------------+---------+------+-----------+------------+--------+  Popliteal    no                                              +--------------+---------+------+-----------+------------+--------+  GSV at SFJ              yes    >500 ms      0.67              +--------------+---------+------+-----------+------------+--------+  GSV prox thighno                            0.57              +--------------+---------+------+-----------+------------+--------+  GSV mid thigh no                            0.46              +--------------+---------+------+-----------+------------+--------+  GSV dist thighno                            0.42              +--------------+---------+------+-----------+------------+--------+  GSV at knee             yes    >500 ms      0.38              +--------------+---------+------+-----------+------------+--------+  GSV prox calf no                            0.23               +--------------+---------+------+-----------+------------+--------+  SSV Pop Fossa no                            0.29              +--------------+---------+------+-----------+------------+--------+  SSV prox calf no                            0.19              +--------------+---------+------+-----------+------------+--------+    ASSESSMENT/PLAN: Kindall Kennel is a 77 y.o. male presenting for evaluation of poorly healing right leg fasciotomy wound.  This was much closer to healing in December 2022 when I saw him last.  I think that his inability to heal the wound has been hindered by poor wound care, and lack of compression.  Venous duplex ultrasound was reviewed demonstrating only mild insufficiency.  I do not think he would benefit from venous ablation.    Regarding the wound, Sebron has multiple reasons for lower extremity edema, most notably his obesity and prior 4 compartment fasciotomies. Johnathyn needs compression and dedicated wound care.  I have sent an urgent referral to the wound care center in an effort to provide the necessary resources.  In clinic today, I debrided the overlying dermis which was exfoliating, and placed a Xeroform, ABD and an Ace wrap.  I asked that he perform this on a daily basis after showering and using a warm washcloth to cleanse the wound bed.   Clarences dermis is extremely thin in this location, and will likely take a significant amount of time to heal.  We discussed the importance of elevation and compression.  My plan is to see him in 2 months time to assess his wound healing.  No plan for vascular surgery intervention at this time.    Victorino Sparrow, MD [facility] [phone]

## 2022-12-27 ENCOUNTER — Ambulatory Visit (HOSPITAL_BASED_OUTPATIENT_CLINIC_OR_DEPARTMENT_OTHER): Payer: Medicare Other | Admitting: Family

## 2022-12-27 ENCOUNTER — Encounter (HOSPITAL_BASED_OUTPATIENT_CLINIC_OR_DEPARTMENT_OTHER): Payer: Self-pay | Admitting: Family

## 2022-12-27 VITALS — BP 149/84 | HR 76 | Ht 68.0 in | Wt 280.0 lb

## 2022-12-27 DIAGNOSIS — E785 Hyperlipidemia, unspecified: Secondary | ICD-10-CM

## 2022-12-27 DIAGNOSIS — Z6841 Body Mass Index (BMI) 40.0 and over, adult: Secondary | ICD-10-CM | POA: Diagnosis not present

## 2022-12-27 DIAGNOSIS — R0609 Other forms of dyspnea: Secondary | ICD-10-CM | POA: Diagnosis not present

## 2022-12-27 DIAGNOSIS — I25118 Atherosclerotic heart disease of native coronary artery with other forms of angina pectoris: Secondary | ICD-10-CM

## 2022-12-27 DIAGNOSIS — I10 Essential (primary) hypertension: Secondary | ICD-10-CM

## 2022-12-27 MED ORDER — METOPROLOL SUCCINATE ER 25 MG PO TB24: 25.0000 mg | ORAL_TABLET | Freq: Every day | ORAL | 3 refills | Status: AC

## 2022-12-27 NOTE — Progress Notes (Signed)
Cardiology Office Note:  .   Date:  12/27/2022  ID:  [name], DOB July 04, 1945, MRN [id number] PCP: [name] Inch, MD  [facility] Providers Cardiologist:  [name] Magic, MD    History of Present Illness: .   [name] is a 77 y.o. male  with a hx of CKD, obesity, ICM (prior LVEF 45-50%, echo 03/2020 with LVEF normalized 55-60%, CAD s/p PCI-pRCA and pLCx   Admitted 08/2019 urosepsis. Cardiac cath revealed severe three vessel CAD. PCI along with medical management deemed best choice by surgical team. Underwent PCI and DES to pRCA and pLx.Sleep study mild OSA recommended for CPAP which he declined.    Hospitalized 9/25-10/13/22 for compartment syndrome after car door hit his leg which required fasciotomy.    Seen 12/16/21 noting DOE. Toprol resumed for GDMT of CAD. Rosuvastatin increased from 2.5mg  twice per week to daily. Subsequent echo 12/2021 LVEF  8/20210-75%, normal PASP, mild MR.  Last seen 09/28/22. Noted exertional dyspnea which he attributed to weight and ischemic eval deferred. Wegovy initiated.    Presents today for follow up independently. Pleasant gentleman who goes by "[name]" who is veteran and retired IT sales professional. He started the [facility] but after the first set he went to pick it up and it was not in stock. When he goes to the [place] in November plans to inquire. Notes persistent dyspnea with less than usual activity. No chest pain, pressure, tightness.   09/06/22 Labs Total cholesterol 128, HDL 33.8, LDL 64, triglycerides 226 Hb 15.7, Plt 271, wbc 9.78 K 4.8, Na 140, Creatinine 1.31, GFR 56 ALT 33, AST 28 A1c 5.9  ROS: Please see the history of present illness.    All other systems reviewed and are negative.   Studies Reviewed: .        Cardiac Studies & Procedures   CARDIAC CATHETERIZATION  CARDIAC CATHETERIZATION 08/12/2019  Narrative Conclusions: 1. Severe three-vessel coronary artery disease, as documented on last weeks diagnostic  catheterization. 2. Successful IVUS-guided PCI of the proximal LCx using Synergy 3.5 x 16 mm drug-eluting stent with 0% residual stenosis and TIMI-3 flow. 3. Successful IVUS-guided PCI to proximal RCA using Synergy 4.0 x 20 mm drug eluting stent with 0% residual stenosis and TIMI-3 flow.  Recommendations: 1. Dual antiplatelet therapy with aspirin and ticagrelor for at least 12 months, ideally longer.  If possible, DAPT should not be interrupted for at least 1 month unless there is life threatening bleeding or emergent surgery is needed. 2. Aggressive secondary prevention of coronary artery disease and medical management of chronically occluded LAD.  [name] Kendall, MD [facility]  Findings Coronary Findings Diagnostic  Dominance: Right  Left Anterior Descending Collaterals Dist LAD filled by collaterals from RPDA.  Mid LAD-1 lesion is 80% stenosed. Mid LAD-2 lesion is 100% stenosed. The lesion is chronically occluded.  Ramus Intermedius Ramus lesion is 75% stenosed.  Left Circumflex Prox Cx lesion is 80% stenosed. Ultrasound (IVUS) was performed. Severe plaque burden was detected. IVUS has determined that the lesion is calcified.  Right Coronary Artery Prox RCA lesion is 90% stenosed. Mid RCA lesion is 25% stenosed.  Intervention  Prox Cx lesion Stent Lesion length:  14 mm. CATHETER LAUNCHER 23F EBU3.5 guide catheter was inserted. Lesion crossed with guidewire using a WIRE RUNTHROUGH .[id number]. Pre-stent angioplasty was performed using a BALLOON SAPPHIRE 3.0X12. Maximum pressure:  12 atm. A drug-eluting stent was successfully placed using a SYNERGY XD 3.50X16. Maximum pressure: 16 atm. Stent strut is well apposed. Post-stent angioplasty  was performed using a BALLOON SAPPHIRE [name]. Maximum pressure:  22 atm. Post-Intervention Lesion Assessment The intervention was successful. Pre-interventional TIMI flow is 3. Post-intervention TIMI flow is 3. No complications occurred  at this lesion. There is a 0% residual stenosis post intervention.  Prox RCA lesion Stent Lesion length:  18 mm. CATH VISTA GUIDE 6FR JR4 guide catheter was inserted. Lesion crossed with guidewire using a WIRE RUNTHROUGH .[id number]. Pre-stent angioplasty was performed using a BALLOON WOLVERINE 3.50X10. Maximum pressure:  12 atm. A drug-eluting stent was successfully placed using a SYNERGY XD 4.0X20. Maximum pressure: 16 atm. Stent strut is well apposed. Post-stent angioplasty was performed using a BALLOON   SAPPHIRE 4.5X18. Maximum pressure:  16 atm. Post-Intervention Lesion Assessment The intervention was successful. Pre-interventional TIMI flow is 3. Post-intervention TIMI flow is 3. No complications occurred at this lesion. There is a 0% residual stenosis post intervention.   CARDIAC CATHETERIZATION 08/09/2019  Narrative  Ramus lesion is 75% stenosed.  Mid LAD-1 lesion is 80% stenosed.  Mid LAD-2 lesion is 99% stenosed.  Prox RCA lesion is 90% stenosed.  Mid RCA lesion is 25% stenosed.  Prox Cx lesion is 80% stenosed.  LV end diastolic pressure is moderately elevated.  There is no aortic valve stenosis.  Severe three vessel disease.  Will get cardiac surgery opinion.  Diffuse distal vessel disease as well.  His RCA and circumflex are treatable percutaneously.  LAD appears to be a chronic occlusion and may not have a good target for LIMA graft.  He has significant other medical issues as well.  Discussed with Dr. Laverle [name].  If he is not a candidate for CABG and if PCI done, urology could wait 30 days post procedure to perform urologic surgery.  Findings Coronary Findings Diagnostic  Dominance: Right  Left Anterior Descending Collaterals Dist LAD filled by collaterals from RPDA.  Mid LAD-1 lesion is 80% stenosed. Mid LAD-2 lesion is 99% stenosed.  Ramus Intermedius Ramus lesion is 75% stenosed.  Left Circumflex Prox Cx lesion is 80% stenosed.  Right Coronary  Artery Prox RCA lesion is 90% stenosed. Mid RCA lesion is 25% stenosed.  Intervention  No interventions have been documented.     ECHOCARDIOGRAM  ECHOCARDIOGRAM COMPLETE 12/31/2021  Narrative ECHOCARDIOGRAM REPORT    Patient Name:   [name] Date of Exam: 12/31/2021 Medical Rec #:  [id number]         Height:       68.0 in Accession #:    [id number]        Weight:       291.0 lb Date of Birth:  1945/11/25         BSA:          2.397 m Patient Age:    76 years          BP:           138/78 mmHg Patient Gender: M                 HR:           68 bpm. Exam Location:  [address]  Procedure: 2D Echo, 3D Echo, Cardiac Doppler and Color Doppler  Indications:    R06.00 Dyspnea  History:        Patient has prior history of Echocardiogram examinations, most recent 03/05/2020. CHF, CAD and Previous Myocardial Infarction, Pulmonary HTN, Signs/Symptoms:Dyspnea; Risk Factors:Hypertension, Dyslipidemia and Former Smoker. Ischemic Cardiomyopathy, Morbid Obesity.  Sonographer:    Farrel Conners RDCS Referring  Phys: Kayleen Memos BADGER  IMPRESSIONS   1. Left ventricular ejection fraction, by estimation, is 70 to 75%. The left ventricle has hyperdynamic function. The left ventricle has no regional wall motion abnormalities. Left ventricular diastolic parameters were normal. 2. Right ventricular systolic function is normal. The right ventricular size is normal. There is normal pulmonary artery systolic pressure. 3. Mild mitral valve regurgitation.  FINDINGS Left Ventricle: Left ventricular ejection fraction, by estimation, is 70 to 75%. The left ventricle has hyperdynamic function. The left ventricle has no regional wall motion abnormalities. The left ventricular internal cavity size was normal in size. There is no left ventricular hypertrophy. Left ventricular diastolic parameters were normal.  Right Ventricle: The right ventricular size is normal. Right vetricular wall thickness  was not assessed. Right ventricular systolic function is normal. There is normal pulmonary artery systolic pressure. The tricuspid regurgitant velocity is 2.74 m/s, and with an assumed right atrial pressure of 3 mmHg, the estimated right ventricular systolic pressure is 33.0 mmHg.  Left Atrium: Left atrial size was normal in size.  Right Atrium: Right atrial size was normal in size.  Pericardium: There is no evidence of pericardial effusion.  Mitral Valve: The mitral valve is normal in structure. Mild mitral valve regurgitation.  Tricuspid Valve: The tricuspid valve is normal in structure. Tricuspid valve regurgitation is mild.  Aortic Valve: The aortic valve is tricuspid. Aortic valve regurgitation is not visualized.  Pulmonic Valve: The pulmonic valve was normal in structure. Pulmonic valve regurgitation is not visualized.  Aorta: The aortic root and ascending aorta are structurally normal, with no evidence of dilitation.  IAS/Shunts: No atrial level shunt detected by color flow Doppler.   LEFT VENTRICLE PLAX 2D LVIDd:         4.00 cm   Diastology LVIDs:         2.35 cm   LV e' medial:    7.83 cm/s LV PW:         0.75 cm   LV E/e' medial:  12.1 LV IVS:        0.85 cm   LV e' lateral:   11.00 cm/s LVOT diam:     2.50 cm   LV E/e' lateral: 8.6 LV SV:         109 LV SV Index:   46 LVOT Area:     4.91 cm  3D Volume EF: 3D EF:        67 % LV EDV:       144 ml LV ESV:       48 ml LV SV:        96 ml  RIGHT VENTRICLE RV Basal diam:  3.30 cm RV S prime:     15.20 cm/s TAPSE (M-mode): 2.8 cm  LEFT ATRIUM             Index        RIGHT ATRIUM           Index LA diam:        4.50 cm 1.88 cm/m   RA Area:     12.90 cm LA Vol (A2C):   51.5 ml 21.49 ml/m  RA Volume:   30.90 ml  12.89 ml/m LA Vol (A4C):   62.2 ml 25.95 ml/m LA Biplane Vol: 58.5 ml 24.41 ml/m AORTIC VALVE LVOT Vmax:   113.00 cm/s LVOT Vmean:  72.650 cm/s LVOT VTI:    0.222 m  AORTA Ao Root diam: 3.20  cm Ao Asc diam:  3.60 cm  MITRAL VALVE                TRICUSPID VALVE MV Area (PHT)  cm          TR Peak grad:   30.0 mmHg MV Decel Time: 239 msec     TR Vmax:        274.00 cm/s MV E velocity: 94.40 cm/s MV A velocity: 108.25 cm/s  SHUNTS MV E/A ratio:  0.87         Systemic VTI:  0.22 m Systemic Diam: 2.50 cm  Dietrich Pates MD Electronically signed by Dietrich Pates MD Signature Date/Time: 12/31/2021/2:10:46 PM    Final             Risk Assessment/Calculations:            Physical Exam:   VS:  BP (!) 149/84   Pulse 76   Ht 5\' 8"  (1.727 m)   Wt 280 lb (127 kg)   BMI 42.57 kg/m    Wt Readings from Last 3 Encounters:  12/27/22 280 lb (127 kg)  11/25/22 279 lb (126.6 kg)  09/28/22 281 lb (127.5 kg)    GEN: Well nourished, overweight, well developed in no acute distress NECK: No JVD; No carotid bruits CARDIAC: RRR, no murmurs, rubs, gallops RESPIRATORY:  Clear to auscultation without rales, wheezing or rhonchi  ABDOMEN: Soft, non-tender, non-distended EXTREMITIES:  No edema; No deformity   ASSESSMENT AND PLAN: .    CAD / HLD LDL goal <70 / DOE- Notes exertional dyspnea which is persistent and bothersome. .Plan for echo to rule out valvular abnormality, HF. Plan for lexiscan myoview to rule out ischemia. GDMT Aspirin, Rosuvastatin, Zetia, Metoprolol. Recommend aiming for 150 minutes of moderate intensity activity per week and following a heart healthy diet.     Morbid obesity / BMI 42 - Weight loss via diet and exercise encouraged. Discussed the impact being overweight would have on cardiovascular risk.Was previously prescribed [facility] but plans to see if he can get it through the [place] in November. He had issues with availability at his local pharmacy, offered to [facility]  OSA - Not wearing CPAP regularly. Reports he sleeps better without it as feels a bit claustrophobic.  Informed Consent   Shared Decision Making/Informed  Consent The risks [chest pain, shortness of breath, cardiac arrhythmias, dizziness, blood pressure fluctuations, myocardial infarction, stroke/transient ischemic attack, nausea, vomiting, allergic reaction, radiation exposure, metallic taste sensation and life-threatening complications (estimated to be 1 in 10,000)], benefits (risk stratification, diagnosing coronary artery disease, treatment guidance) and alternatives of a nuclear stress test were discussed in detail with Mr. Paxson and he agrees to proceed.     Dispo: follow up in 2-3 months  Signed, Alver Sorrow, NP

## 2022-12-27 NOTE — Patient Instructions (Signed)
Medication Instructions:  Your physician has recommended you make the following change in your medication:   CHANGE Metoprolol to whole tablet (25 mg) daily  *If you need a refill on your cardiac medications before your next appointment, please call your pharmacy*  Testing/Procedures: Your physician has requested that you have an echocardiogram. Echocardiography is a painless test that uses sound waves to create images of your heart. It provides your doctor with information about the size and shape of your heart and how well your heart's chambers and valves are working. This procedure takes approximately one hour. There are no restrictions for this procedure. Please do NOT wear cologne, perfume, aftershave, or lotions (deodorant is allowed). Please arrive 15 minutes prior to your appointment time.   Your physician has requested that you have a lexiscan myoview. Please follow instruction sheet, as given.  Follow-Up: At [facility], you and your health needs are our priority.  As part of our continuing mission to provide you with exceptional heart care, we have created designated Provider Care Teams.  These Care Teams include your primary Cardiologist (physician) and Advanced Practice Providers (APPs -  Physician Assistants and Nurse Practitioners) who all work together to provide you with the care you need, when you need it.  We recommend signing up for the patient portal called "MyChart".  Sign up information is provided on this After Visit Summary.  MyChart is used to connect with patients for Virtual Visits (Telemedicine).  Patients are able to view lab/test results, encounter notes, upcoming appointments, etc.  Non-urgent messages can be sent to your provider as well.   To learn more about what you can do with MyChart, go to [email or web address].    Your next appointment:   2-3 month(s)  Provider:   Armanda Magic, MD     Other Instructions  Exercises to do While  Sitting Warm-up Before starting other exercises: Sit up as straight as you can. Have your knees bent at 90 degrees, which is the shape of the capital letter "L." Keep your feet flat on the floor. Sit at the front edge of your chair, if you can. Pull in (tighten) the muscles in your abdomen and stretch your spine and neck as straight as you can. Hold this position for a few minutes. Breathe in and out evenly. Try to concentrate on your breathing, and relax your mind.  Stretching Exercise A: Arm stretch Hold your arms out straight in front of your body. Bend your hands at the wrist with your fingers pointing up, as if signaling someone to stop. Notice the slight tension in your forearms as you hold the position. Keeping your arms out and your hands bent, rotate your hands outward as far as you can and hold this stretch. Aim to have your thumbs pointing up and your pinkie fingers pointing down. Slowly repeat arm stretches for one minute as tolerated. Exercise B: Leg stretch If you can move your legs, try to "draw" letters on the floor with the toes of your foot. Write your name with one foot. Write your name with the toes of your other foot. Slowly repeat the movements for one minute as tolerated. Exercise C: Reach for the sky Reach your hands as far over your head as you can to stretch your spine. Move your hands and arms as if you are climbing a rope. Slowly repeat the movements for one minute as tolerated.  Range of motion exercises Exercise A: Shoulder roll Let your arms hang loosely at  your sides. Lift just your shoulders up toward your ears, then let them relax back down. When your shoulders feel loose, rotate your shoulders in backward and forward circles. Do shoulder rolls slowly for one minute as tolerated. Exercise B: March in place As if you are marching, pump your arms and lift your legs up and down. Lift your knees as high as you can. If you are unable to lift your knees,  just pump your arms and move your ankles and feet up and down. March in place for one minute as tolerated. Exercise C: Seated jumping jacks Let your arms hang down straight. Keeping your arms straight, lift them up over your head. Aim to point your fingers to the ceiling. While you lift your arms, straighten your legs and slide your heels along the floor to your sides, as wide as you can. As you bring your arms back down to your sides, slide your legs back together. If you are unable to use your legs, just move your arms. Slowly repeat seated jumping jacks for one minute as tolerated.  Strengthening exercises Exercise A: Shoulder squeeze Hold your arms straight out from your body to your sides, with your elbows bent and your fists pointed at the ceiling. Keeping your arms in the bent position, move them forward so your elbows and forearms meet in front of your face. Open your arms back out as wide as you can with your elbows still bent, until you feel your shoulder blades squeezing together. Hold for 5 seconds. Slowly repeat the movements forward and backward for one minute as tolerated.

## 2022-12-29 ENCOUNTER — Encounter (HOSPITAL_BASED_OUTPATIENT_CLINIC_OR_DEPARTMENT_OTHER): Payer: Medicare Other | Attending: General Surgery | Admitting: General Surgery

## 2022-12-29 DIAGNOSIS — L97812 Non-pressure chronic ulcer of other part of right lower leg with fat layer exposed: Secondary | ICD-10-CM | POA: Diagnosis not present

## 2022-12-29 DIAGNOSIS — T79A0XS Compartment syndrome, unspecified, sequela: Secondary | ICD-10-CM | POA: Insufficient documentation

## 2022-12-29 DIAGNOSIS — S81801A Unspecified open wound, right lower leg, initial encounter: Secondary | ICD-10-CM | POA: Diagnosis not present

## 2022-12-29 DIAGNOSIS — I2510 Atherosclerotic heart disease of native coronary artery without angina pectoris: Secondary | ICD-10-CM | POA: Insufficient documentation

## 2022-12-29 DIAGNOSIS — I252 Old myocardial infarction: Secondary | ICD-10-CM | POA: Insufficient documentation

## 2022-12-29 DIAGNOSIS — I272 Pulmonary hypertension, unspecified: Secondary | ICD-10-CM | POA: Diagnosis not present

## 2022-12-29 DIAGNOSIS — Z87891 Personal history of nicotine dependence: Secondary | ICD-10-CM | POA: Insufficient documentation

## 2022-12-29 DIAGNOSIS — Z85828 Personal history of other malignant neoplasm of skin: Secondary | ICD-10-CM | POA: Insufficient documentation

## 2022-12-29 DIAGNOSIS — Z6841 Body Mass Index (BMI) 40.0 and over, adult: Secondary | ICD-10-CM | POA: Insufficient documentation

## 2022-12-29 DIAGNOSIS — I509 Heart failure, unspecified: Secondary | ICD-10-CM | POA: Insufficient documentation

## 2022-12-29 DIAGNOSIS — R600 Localized edema: Secondary | ICD-10-CM | POA: Insufficient documentation

## 2022-12-29 DIAGNOSIS — W228XXS Striking against or struck by other objects, sequela: Secondary | ICD-10-CM | POA: Diagnosis not present

## 2022-12-29 DIAGNOSIS — E785 Hyperlipidemia, unspecified: Secondary | ICD-10-CM | POA: Insufficient documentation

## 2022-12-29 DIAGNOSIS — G4730 Sleep apnea, unspecified: Secondary | ICD-10-CM | POA: Diagnosis not present

## 2022-12-29 DIAGNOSIS — I110 Hypertensive heart disease with heart failure: Secondary | ICD-10-CM | POA: Diagnosis not present

## 2022-12-29 NOTE — Progress Notes (Signed)
NIKUNJ, LIPSETT ([id number]) [id number] Page 1 of 4 Visit Report for 12/29/2022 Abuse Risk Screen Details Patient Name: Date of Service: [name], [name] 12/29/2022 9:00 A M Medical Record Number: [id number] Patient Account Number: [id number] Date of Birth/Sex: Treating RN: [birth year] (77 y.o. Damaris Schooner Primary Care Seamus Warehime: Antony Haste Other Clinician: Referring Myan Suit: Treating Farmer Mccahill/Extender: Kern Reap in Treatment: 0 Abuse Risk Screen Items Answer ABUSE RISK SCREEN: Has anyone close to you tried to hurt or harm you recentlyo No Do you feel uncomfortable with anyone in your familyo No Has anyone forced you do things that you didnt want to doo No Electronic Signature(s) Signed: 12/29/2022 3:46:04 PM By: Zenaida Deed RN, BSN Entered By: Zenaida Deed on 12/29/2022 09:37:29 -------------------------------------------------------------------------------- Activities of Daily Living Details Patient Name: Date of Service: [name], [name] 12/29/2022 9:00 A M Medical Record Number: [id number] Patient Account Number: [id number] Date of Birth/Sex: Treating RN: 1945/09/28 (77 y.o. Damaris Schooner Primary Care Cecily Lawhorne: Antony Haste Other Clinician: Referring Lashea Goda: Treating Nanako Stopher/Extender: Kern Reap in Treatment: 0 Activities of Daily Living Items Answer Activities of Daily Living (Please select one for each item) Drive Automobile Completely Able T Medications ake Completely Able Use T elephone Completely Able Care for Appearance Completely Able Use T oilet Completely Able Bath / Shower Completely Able Dress Self Completely Able Feed Self Completely Able Walk Completely Able Get In / Out Bed Completely Able Housework Completely Able Prepare Meals Completely Able Handle Money Completely Able Shop for Self Completely Able Electronic  Signature(s) Signed: 12/29/2022 3:46:04 PM By: Zenaida Deed RN, BSN Entered By: Zenaida Deed on 12/29/2022 09:37:54 Schepers, Marilu Favre ([id number]) [id number] Page 2 of 4 -------------------------------------------------------------------------------- Education Screening Details Patient Name: Date of Service: [name], [name] 12/29/2022 9:00 A M Medical Record Number: [id number] Patient Account Number: [id number] Date of Birth/Sex: Treating RN: [birth year] (77 y.o. Damaris Schooner Primary Care Arena Lindahl: Antony Haste Other Clinician: Referring Deaken Jurgens: Treating Deniqua Perry/Extender: Kern Reap in Treatment: 0 Primary Learner Assessed: Patient Learning Preferences/Education Level/Primary Language Learning Preference: Explanation, Demonstration, Printed Material Highest Education Level: College or Above Preferred Language: English Cognitive Barrier Language Barrier: No Translator Needed: No Memory Deficit: No Emotional Barrier: No Cultural/Religious Beliefs Affecting Medical Care: No Physical Barrier Impaired Vision: Yes Glasses Impaired Hearing: Yes Hearing Aid Decreased Hand dexterity: No Knowledge/Comprehension Knowledge Level: High Comprehension Level: High Ability to understand written instructions: High Ability to understand verbal instructions: High Motivation Anxiety Level: Calm Cooperation: Cooperative Education Importance: Acknowledges Need Interest in Health Problems: Asks Questions Perception: Coherent Willingness to Engage in Self-Management High Activities: Readiness to Engage in Self-Management High Activities: Electronic Signature(s) Signed: 12/29/2022 3:46:04 PM By: Zenaida Deed RN, BSN Entered By: Zenaida Deed on 12/29/2022 09:38:28 -------------------------------------------------------------------------------- Fall Risk Assessment Details Patient Name: Date of Service: [name]  [name], [name] 12/29/2022 9:00 A M Medical Record Number: [id number] Patient Account Number: [id number] Date of Birth/Sex: Treating RN: 1946-04-08 (77 y.o. Damaris Schooner Primary Care Gordon Vandunk: Antony Haste Other Clinician: Referring Ebony Rickel: Treating Kirsten Spearing/Extender: Kern Reap in Treatment: 0 Fall Risk Assessment Items Have you had 2 or more falls in the last 12 monthso 0 No Vigo, Mamoudou ([id number]) [phone] [id number] Page 3 of 4 Have you had any fall that resulted in injury in the last 12 monthso 0 No FALLS RISK SCREEN History of falling - immediate or within 3 months 0 No Secondary diagnosis (Do you have 2 or more medical diagnoseso)  0 No Ambulatory aid None/bed rest/wheelchair/nurse 0 No Crutches/cane/walker 15 Yes Furniture 0 No Intravenous therapy Access/Saline/Heparin Lock 0 No Gait/Transferring Normal/ bed rest/ wheelchair 0 Yes Weak (short steps with or without shuffle, stooped but able to lift head while walking, may seek 0 No support from furniture) Impaired (short steps with shuffle, may have difficulty arising from chair, head down, impaired 0 No balance) Mental Status Oriented to own ability 0 Yes Electronic Signature(s) Signed: 12/29/2022 3:46:04 PM By: Zenaida Deed RN, BSN Entered By: Zenaida Deed on 12/29/2022 09:38:53 -------------------------------------------------------------------------------- Foot Assessment Details Patient Name: Date of Service: [name], [name] 12/29/2022 9:00 A M Medical Record Number: [id number] Patient Account Number: [id number] Date of Birth/Sex: Treating RN: 02-22-1946 (77 y.o. Damaris Schooner Primary Care Elena Davia: Antony Haste Other Clinician: Referring Caidence Kaseman: Treating Callen Vancuren/Extender: Kern Reap in Treatment: 0 Foot Assessment Items Site Locations + = Sensation present, - = Sensation absent, C = Callus, U =  Ulcer R = Redness, W = Warmth, M = Maceration, PU = Pre-ulcerative lesion F = Fissure, S = Swelling, D = Dryness Assessment Right: Left: Other Deformity: No No Prior Foot Ulcer: No No Prior Amputation: No No Charcot Joint: No No Ambulatory Status: Ambulatory Without Help GaitDERION, HOLLIFIELD ([id number]) [phone] [id number] Page 4 of 4 Electronic Signature(s) Signed: 12/29/2022 3:46:04 PM By: Zenaida Deed RN, BSN Entered By: Zenaida Deed on 12/29/2022 09:39:22 -------------------------------------------------------------------------------- Nutrition Risk Screening Details Patient Name: Date of Service: [name], [name] 12/29/2022 9:00 A M Medical Record Number: [id number] Patient Account Number: [id number] Date of Birth/Sex: Treating RN: Oct 19, 1945 (77 y.o. Damaris Schooner Primary Care Shelvie Salsberry: Antony Haste Other Clinician: Referring Joanette Silveria: Treating Demarius Archila/Extender: Kern Reap in Treatment: 0 Height (in): 68 Weight (lbs): 280 Body Mass Index (BMI): 42.6 Nutrition Risk Screening Items Score Screening NUTRITION RISK SCREEN: I have an illness or condition that made me change the kind and/or amount of food I eat 0 No I eat fewer than two meals per day 0 No I eat few fruits and vegetables, or milk products 0 No I have three or more drinks of beer, liquor or wine almost every day 0 No I have tooth or mouth problems that make it hard for me to eat 0 No I don't always have enough money to buy the food I need 0 No I eat alone most of the time 0 No I take three or more different prescribed or over-the-counter drugs a day 1 Yes Without wanting to, I have lost or gained 10 pounds in the last six months 0 No I am not always physically able to shop, cook and/or feed myself 0 No Nutrition Protocols Good Risk Protocol 0 No interventions needed Moderate Risk Protocol High Risk Proctocol Risk Level: Good  Risk Score: 1 Electronic Signature(s) Signed: 12/29/2022 3:46:04 PM By: Zenaida Deed RN, BSN Entered By: Zenaida Deed on 12/29/2022 09:39:13

## 2022-12-29 NOTE — Progress Notes (Addendum)
CULVER, DRAIN ([id number]) [id number] Page 1 of 9 Visit Report for 12/29/2022 Chief Complaint Document Details Patient Name: Date of Service: [name], [name] 12/29/2022 9:00 A M Medical Record Number: [id number] Patient Account Number: [id number] Date of Birth/Sex: Treating RN: 10/23/1945 (77 y.o. M) Primary Care Provider: Antony Haste Other Clinician: Referring Provider: Treating Provider/Extender: Kern Reap in Treatment: 0 Information Obtained from: Patient Chief Complaint Patient seen for complaints of Non-Healing Wound. Electronic Signature(s) Signed: 12/29/2022 9:14:41 AM By: Duanne Guess MD FACS Entered By: Duanne Guess on 12/29/2022 09:14:41 -------------------------------------------------------------------------------- HPI Details Patient Name: Date of Service: [name], [name] 12/29/2022 9:00 A M Medical Record Number: [id number] Patient Account Number: [id number] Date of Birth/Sex: Treating RN: 10-06-1945 (77 y.o. M) Primary Care Provider: Antony Haste Other Clinician: Referring Provider: Treating Provider/Extender: Kern Reap in Treatment: 0 History of Present Illness HPI Description: ADMISSION 12/29/2022 Non-Invasive Vascular Imaging: (2022) +-------+-----------+-----------+------------+------------+ ABI/TBIT oday's ABIT oday's TBIPrevious ABIPrevious TBI +-------+-----------+-----------+------------+------------+ Right 1.09 0.88    +-------+-----------+-----------+------------+------------+ Left 1.07 0.71    +-------+-----------+-----------+------------+------------+ Venous Reflux Times: (2024) +--------------+---------+------+-----------+------------+--------+ RIGHT Reflux NoRefluxReflux TimeDiameter cmsComments    Yes     +--------------+---------+------+-----------+------------+--------+ CFV no       +--------------+---------+------+-----------+------------+--------+ FV mid no      +--------------+---------+------+-----------+------------+--------+ Popliteal no      +--------------+---------+------+-----------+------------+--------+ GSV at [facility]   yes  >500 ms  0.67   +--------------+---------+------+-----------+------------+--------+ GSV prox thighno    0.57   +--------------+---------+------+-----------+------------+--------+ GSV mid thigh no    0.46   +--------------+---------+------+-----------+------------+--------+ [name] ([id number]) [id number] Page 2 of 9 GSV dist thighno    0.42   +--------------+---------+------+-----------+------------+--------+ GSV at knee   yes  >500 ms  0.38   +--------------+---------+------+-----------+------------+--------+ GSV prox calf no    0.23   +--------------+---------+------+-----------+------------+--------+ SSV Pop Fossa no    0.29   +--------------+---------+------+-----------+------------+--------+ SSV prox calf no    0.19   +--------------+---------+------+-----------+------------+--------+ This is a 77 year old non diabetic who underwent 4 compartment fasciotomy in 2022 after striking his leg on a car door. He required skin grafting and his wounds have been slow to heal. Arterial studies done after his surgery are copied above with adequate ABIs for healing, at least at that time. He recently had venous reflux studies done to see if that could be the etiology for his slow wound healing as well, but his reflux is not significant and vascular surgery does not think he would benefit from ablation. He is fairly inactive, secondary to his obesity. He does not wear compression stockings regularly, but has them on today. Vascular surgery referred him to the wound care center for further evaluation and management. Electronic  Signature(s) Signed: 12/29/2022 10:12:44 AM By: Duanne Guess MD FACS Previous Signature: 12/29/2022 9:47:36 AM Version By: Duanne Guess MD FACS Previous Signature: 12/29/2022 9:23:41 AM Version By: Duanne Guess MD FACS Previous Signature: 12/29/2022 9:15:55 AM Version By: Duanne Guess MD FACS Entered By: Duanne Guess on 12/29/2022 10:12:44 -------------------------------------------------------------------------------- Physical Exam Details Patient Name: Date of Service: [name], [name] 12/29/2022 9:00 A M Medical Record Number: [id number] Patient Account Number: [id number] Date of Birth/Sex: Treating RN: 11-Aug-1945 (77 y.o. M) Primary Care Provider: Antony Haste Other Clinician: Referring Provider: Treating Provider/Extender: Kern Reap in Treatment: 0 Constitutional Hypertensive, asymptomatic. . . . No acute distress. Respiratory Normal work of breathing on room air. Cardiovascular . Notes 12/29/2022: He has scars on his right lower leg consistent with his history of 4 compartment fasciotomy. The skin graft area has  purple-colored skin that is smooth and shiny in appearance. There are couple of tiny open areas on the anteromedial aspect of his leg, just to the midline of the anterior tibial surface. No concern for infection. He has 1+ nonpitting edema. Electronic Signature(s) Signed: 12/29/2022 10:14:24 AM By: Duanne Guess MD FACS Entered By: Duanne Guess on 12/29/2022 10:14:24 -------------------------------------------------------------------------------- Physician Orders Details Patient Name: Date of Service: [name], [name] 12/29/2022 9:00 A M Medical Record Number: [id number] Patient Account Number: [id number] Date of Birth/Sex: Treating RN: 07-07-1945 (77 y.o. Damaris Schooner Primary Care Provider: Antony Haste Other Clinician: Referring Provider: Treating Provider/Extender: Kern Reap in Treatment: 0 Verbal / Phone Orders: No [name], [name] ([id number]) [id number] Page 3 of 9 Diagnosis Coding ICD-10 Coding Code Description [phone] Non-pressure chronic ulcer of other part of right lower leg with fat layer exposed T79.A0XS Compartment syndrome, unspecified, sequela R60.0 Localized edema E66.01 Morbid (severe) obesity due to excess calories Follow-up Appointments ppointment in 1 week. - Dr. Lady Gary RM 1 Return A Bathing/ Shower/ Hygiene May shower with protection but do not get wound dressing(s) wet. Protect dressing(s) with water repellant cover (for example, large plastic bag) or a cast cover and may then take shower. Edema Control - Lymphedema / SCD / Other Elevate legs to the level of the heart or above for 30 minutes daily and/or when sitting for 3-4 times a day throughout the day. Avoid standing for long periods of time. Exercise regularly Wound Treatment Wound #1 - Lower Leg Wound Laterality: Right, Medial Peri-Wound Care: Triamcinolone 15 (g) 1 x Per Week Discharge Instructions: Use triamcinolone 15 (g) as directed Peri-Wound Care: Sween Lotion (Moisturizing lotion) 1 x Per Week Discharge Instructions: Apply moisturizing lotion as directed Prim Dressing: Maxorb Extra Calcium Alginate, 2x2 (in/in) 1 x Per Week ary Discharge Instructions: Apply to wound bed as instructed Secondary Dressing: Woven Gauze Sponge, Non-Sterile 4x4 in 1 x Per Week Discharge Instructions: Apply over primary dressing as directed. Compression Wrap: Urgo K2 Lite, (equivalent to a 3 layer) two layer compression system, regular 1 x Per Week Discharge Instructions: Apply Urgo K2 Lite as directed (alternative to 3 layer compression). Electronic Signature(s) Signed: 12/29/2022 10:48:59 AM By: Duanne Guess MD FACS Entered By: Duanne Guess on 12/29/2022  10:16:08 -------------------------------------------------------------------------------- Problem List Details Patient Name: Date of Service: [name], [name] 12/29/2022 9:00 A M Medical Record Number: [id number] Patient Account Number: [id number] Date of Birth/Sex: Treating RN: 08-29-1945 (77 y.o. M) Primary Care Provider: Antony Haste Other Clinician: Referring Provider: Treating Provider/Extender: Kern Reap in Treatment: 0 Active Problems ICD-10 Encounter Code Description Active Date MDM Diagnosis [phone] Non-pressure chronic ulcer of other part of right lower leg with fat layer 12/29/2022 No Yes exposed T79.A0XS Compartment syndrome, unspecified, sequela 12/29/2022 No Yes [name], [name] ([id number]) [id number] Page 4 of 9 R60.0 Localized edema 12/29/2022 No Yes E66.01 Morbid (severe) obesity due to excess calories 12/29/2022 No Yes Inactive Problems Resolved Problems Electronic Signature(s) Signed: 12/29/2022 10:12:11 AM By: Duanne Guess MD FACS Previous Signature: 12/29/2022 9:14:17 AM Version By: Duanne Guess MD FACS Entered By: Duanne Guess on 12/29/2022 10:12:11 -------------------------------------------------------------------------------- Progress Note Details Patient Name: Date of Service: [name], [name] 12/29/2022 9:00 A M Medical Record Number: [id number] Patient Account Number: [id number] Date of Birth/Sex: Treating RN: 1946/04/12 (77 y.o. M) Primary Care Provider: Antony Haste Other Clinician: Referring Provider: Treating Provider/Extender: Kern Reap in Treatment: 0 Subjective Chief Complaint Information obtained from Patient Patient seen  for complaints of Non-Healing Wound. History of Present Illness (HPI) ADMISSION 12/29/2022 Non-Invasive Vascular Imaging: (2022) +-------+-----------+-----------+------------+------------+ ABI/TBIT oday's ABIT  oday's TBIPrevious ABIPrevious TBI +-------+-----------+-----------+------------+------------+ Right 1.09 0.88    +-------+-----------+-----------+------------+------------+ Left 1.07 0.71    +-------+-----------+-----------+------------+------------+ Venous Reflux Times: (2024) +--------------+---------+------+-----------+------------+--------+ RIGHT Reflux NoRefluxReflux TimeDiameter cmsComments    Yes     +--------------+---------+------+-----------+------------+--------+ CFV no      +--------------+---------+------+-----------+------------+--------+ FV mid no      +--------------+---------+------+-----------+------------+--------+ Popliteal no      +--------------+---------+------+-----------+------------+--------+ GSV at [facility]   yes  >500 ms  0.67   +--------------+---------+------+-----------+------------+--------+ GSV prox thighno    0.57   +--------------+---------+------+-----------+------------+--------+ GSV mid thigh no    0.46   +--------------+---------+------+-----------+------------+--------+ GSV dist thighno    0.42   +--------------+---------+------+-----------+------------+--------+ GSV at knee   yes  >500 ms  0.38   +--------------+---------+------+-----------+------------+--------+ GSV prox calf no    0.23   +--------------+---------+------+-----------+------------+--------+ SSV Pop Fossa no    0.29   [name], [name] ([id number]) [id number] Page 5 of 9 +--------------+---------+------+-----------+------------+--------+ SSV prox calf no    0.19   +--------------+---------+------+-----------+------------+--------+ This is a 77 year old non diabetic who underwent 4 compartment fasciotomy in 2022 after striking his leg on a car door. He required skin grafting and his wounds have been slow to heal. Arterial studies done after his  surgery are copied above with adequate ABIs for healing, at least at that time. He recently had venous reflux studies done to see if that could be the etiology for his slow wound healing as well, but his reflux is not significant and vascular surgery does not think he would benefit from ablation. He is fairly inactive, secondary to his obesity. He does not wear compression stockings regularly, but has them on today. Vascular surgery referred him to the wound care center for further evaluation and management. Patient History Information obtained from Patient, Chart. Allergies codeine (Reaction: headaches), Keflex (Reaction: rash) Family History Cancer - Mother, Diabetes - Maternal Grandparents, Heart Disease - Father, Hypertension - Mother,Father, No family history of Hereditary Spherocytosis, Kidney Disease, Lung Disease, Seizures, Stroke, Thyroid Problems, Tuberculosis. Social History Former smoker - quit 1997, Marital Status - Married, Alcohol Use - Never, Drug Use - No History, Caffeine Use - Rarely. Medical History Eyes Denies history of Cataracts, Glaucoma, Optic Neuritis Respiratory Patient has history of Sleep Apnea - CPAP Cardiovascular Patient has history of Congestive Heart Failure, Coronary Artery Disease, Hypertension, Myocardial Infarction Endocrine Denies history of Type I Diabetes, Type II Diabetes Genitourinary Denies history of End Stage Renal Disease Integumentary (Skin) Denies history of History of Burn Oncologic Denies history of Received Chemotherapy, Received Radiation Psychiatric Denies history of Anorexia/bulimia, Confinement Anxiety Medical A Surgical History Notes nd Constitutional Symptoms (General Health) morbid obesity Ear/Nose/Mouth/Throat hard of hearing Cardiovascular hyperlipidemia, pulmonary hypertension Gastrointestinal colon polyp Genitourinary kidney stones Integumentary (Skin) seborrheic dermatitis Musculoskeletal hx compartment  syndrome right lower leg Oncologic skin cancer removed Review of Systems (ROS) Constitutional Symptoms (General Health) Denies complaints or symptoms of Fatigue, Fever, Chills, Marked Weight Change. Eyes Complains or has symptoms of Glasses / Contacts. Denies complaints or symptoms of Dry Eyes, Vision Changes. Ear/Nose/Mouth/Throat Denies complaints or symptoms of Chronic sinus problems or rhinitis. Respiratory Complains or has symptoms of Shortness of Breath. Denies complaints or symptoms of Chronic or frequent coughs. Cardiovascular Denies complaints or symptoms of Chest pain. Gastrointestinal Denies complaints or symptoms of Frequent diarrhea, Nausea, Vomiting. Endocrine Denies complaints or symptoms of Heat/cold intolerance. Genitourinary Denies complaints or symptoms of Frequent urination. Integumentary (Skin) Complains or has  symptoms of Wounds - right leg. Musculoskeletal Denies complaints or symptoms of Muscle Pain, Muscle Weakness. Neurologic Denies complaints or symptoms of Numbness/parasthesias. Psychiatric Denies complaints or symptoms of Claustrophobia. [name], [name] ([id number]) [id number] Page 6 of 9 Objective Constitutional Hypertensive, asymptomatic. No acute distress. Vitals Time Taken: 8:41 AM, Height: 68 in, Weight: 280 lbs, BMI: 42.6, Temperature: 97.6 F, Pulse: 69 bpm, Respiratory Rate: 18 breaths/min, Blood Pressure: 159/81 mmHg. Respiratory Normal work of breathing on room air. General Notes: 12/29/2022: He has scars on his right lower leg consistent with his history of 4 compartment fasciotomy. The skin graft area has purple-colored skin that is smooth and shiny in appearance. There are couple of tiny open areas on the anteromedial aspect of his leg, just to the midline of the anterior tibial surface. No concern for infection. He has 1+ nonpitting edema. Integumentary (Hair, Skin) Wound #1 status is Open. Original  cause of wound was Not Known. The date acquired was: 01/24/2021. The wound is located on the Right,Medial Lower Leg. The wound measures 1cm length x 2.3cm width x 0.1cm depth; 1.806cm^2 area and 0.181cm^3 volume. There is Fat Layer (Subcutaneous Tissue) exposed. There is no tunneling or undermining noted. There is a medium amount of serous drainage noted. The wound margin is indistinct and nonvisible. There is small (1-33%) red granulation within the wound bed. There is no necrotic tissue within the wound bed. The periwound skin appearance had no abnormalities noted for moisture. The periwound skin appearance exhibited: Scarring, Ecchymosis. Periwound temperature was noted as No Abnormality. The periwound has tenderness on palpation. Assessment Active Problems ICD-10 Non-pressure chronic ulcer of other part of right lower leg with fat layer exposed Compartment syndrome, unspecified, sequela Localized edema Morbid (severe) obesity due to excess calories Procedures Wound #1 Pre-procedure diagnosis of Wound #1 is a T be determined located on the Right,Medial Lower Leg . There was a Double Layer Compression Therapy Procedure o by Zenaida Deed, RN. Post procedure Diagnosis Wound #1: Same as Pre-Procedure Notes: urgo lite. Plan Follow-up Appointments: Return Appointment in 1 week. - Dr. Lady Gary RM 1 Bathing/ Shower/ Hygiene: May shower with protection but do not get wound dressing(s) wet. Protect dressing(s) with water repellant cover (for example, large plastic bag) or a cast cover and may then take shower. Edema Control - Lymphedema / SCD / Other: Elevate legs to the level of the heart or above for 30 minutes daily and/or when sitting for 3-4 times a day throughout the day. Avoid standing for long periods of time. Exercise regularly WOUND #1: - Lower Leg Wound Laterality: Right, Medial Peri-Wound Care: Triamcinolone 15 (g) 1 x Per Week/ Discharge Instructions: Use triamcinolone 15 (g) as  directed Peri-Wound Care: Sween Lotion (Moisturizing lotion) 1 x Per Week/ Discharge Instructions: Apply moisturizing lotion as directed Prim Dressing: Maxorb Extra Calcium Alginate, 2x2 (in/in) 1 x Per Week/ ary Discharge Instructions: Apply to wound bed as instructed Secondary Dressing: Woven Gauze Sponge, Non-Sterile 4x4 in 1 x Per Week/ Discharge Instructions: Apply over primary dressing as directed. Com pression Wrap: Urgo K2 Lite, (equivalent to a 3 layer) two layer compression system, regular 1 x Per Week/ Discharge Instructions: Apply Urgo K2 Lite as directed (alternative to 3 layer compression). 12/29/2022: He has scars on his right lower leg consistent with his history of 4 compartment fasciotomy. The skin graft area has purple-colored skin that is [name], [name] ([id number]) [id number] Page 7 of 9 smooth and shiny in appearance. There are couple of tiny open areas on the  anteromedial aspect of his leg, just to the midline of the anterior tibial surface. No concern for infection. He has 1+ nonpitting edema. No debridement was necessary today. The wounds are quite small and should heal with minimal intervention. He will benefit from edema control. We are going to put silver alginate over the open sites and use a 3 layer compression equivalent wrap. He was encouraged to wear his compression stocking on his left leg and keep his legs elevated is much as possible throughout the day. Follow-up in 1 week. Electronic Signature(s) Signed: 12/29/2022 10:17:00 AM By: Duanne Guess MD FACS Entered By: Duanne Guess on 12/29/2022 10:17:00 -------------------------------------------------------------------------------- HxROS Details Patient Name: Date of Service: [name], [name] 12/29/2022 9:00 A M Medical Record Number: [id number] Patient Account Number: [id number] Date of Birth/Sex: Treating RN: December 13, 1945 (77 y.o. Damaris Schooner Primary Care  Provider: Antony Haste Other Clinician: Referring Provider: Treating Provider/Extender: Kern Reap in Treatment: 0 Information Obtained From Patient Chart Constitutional Symptoms (General Health) Complaints and Symptoms: Negative for: Fatigue; Fever; Chills; Marked Weight Change Medical History: Past Medical History Notes: morbid obesity Eyes Complaints and Symptoms: Positive for: Glasses / Contacts Negative for: Dry Eyes; Vision Changes Medical History: Negative for: Cataracts; Glaucoma; Optic Neuritis Ear/Nose/Mouth/Throat Complaints and Symptoms: Negative for: Chronic sinus problems or rhinitis Medical History: Past Medical History Notes: hard of hearing Respiratory Complaints and Symptoms: Positive for: Shortness of Breath Negative for: Chronic or frequent coughs Medical History: Positive for: Sleep Apnea - CPAP Cardiovascular Complaints and Symptoms: Negative for: Chest pain Medical History: Positive for: Congestive Heart Failure; Coronary Artery Disease; Hypertension; Myocardial Infarction Past Medical History Notes: hyperlipidemia, pulmonary hypertension Gastrointestinal [name], [name] ([id number]) [id number] Page 8 of 9 Complaints and Symptoms: Negative for: Frequent diarrhea; Nausea; Vomiting Medical History: Past Medical History Notes: colon polyp Endocrine Complaints and Symptoms: Negative for: Heat/cold intolerance Medical History: Negative for: Type I Diabetes; Type II Diabetes Genitourinary Complaints and Symptoms: Negative for: Frequent urination Medical History: Negative for: End Stage Renal Disease Past Medical History Notes: kidney stones Integumentary (Skin) Complaints and Symptoms: Positive for: Wounds - right leg Medical History: Negative for: History of Burn Past Medical History Notes: seborrheic dermatitis Musculoskeletal Complaints and Symptoms: Negative for: Muscle  Pain; Muscle Weakness Medical History: Past Medical History Notes: hx compartment syndrome right lower leg Neurologic Complaints and Symptoms: Negative for: Numbness/parasthesias Psychiatric Complaints and Symptoms: Negative for: Claustrophobia Medical History: Negative for: Anorexia/bulimia; Confinement Anxiety Hematologic/Lymphatic Immunological Oncologic Medical History: Negative for: Received Chemotherapy; Received Radiation Past Medical History Notes: skin cancer removed Immunizations Pneumococcal Vaccine: Received Pneumococcal Vaccination: Yes Received Pneumococcal Vaccination On or After 60th Birthday: Yes Implantable Devices No devices added Family and Social History Cancer: Yes - Mother; Diabetes: Yes - Maternal Grandparents; Heart Disease: Yes - Father; Hereditary Spherocytosis: No; Hypertension: Yes - Mother,Father; Kidney Disease: No; Lung Disease: No; Seizures: No; Stroke: No; Thyroid Problems: No; Tuberculosis: No; Former smoker - quit 1997; Marital [name], [name] ([id number]) [id number] Page 9 of 9 Status - Married; Alcohol Use: Never; Drug Use: No History; Caffeine Use: Rarely; Financial Concerns: No; Food, Clothing or Shelter Needs: No; Support System Lacking: No; Transportation Concerns: No Electronic Signature(s) Signed: 12/29/2022 10:48:59 AM By: Duanne Guess MD FACS Signed: 12/29/2022 3:46:04 PM By: Zenaida Deed RN, BSN Entered By: Zenaida Deed on 12/29/2022 09:37:17 -------------------------------------------------------------------------------- SuperBill Details Patient Name: Date of Service: [name], [name] 12/29/2022 Medical Record Number: [id number] Patient Account Number: [id number] Date of Birth/Sex: Treating RN: 06-24-1945 (77 y.o. M)  Primary Care Provider: Antony Haste Other Clinician: Referring Provider: Treating Provider/Extender: Kern Reap in Treatment: 0 Diagnosis  Coding ICD-10 Codes Code Description [phone] Non-pressure chronic ulcer of other part of right lower leg with fat layer exposed T79.A0XS Compartment syndrome, unspecified, sequela R60.0 Localized edema E66.01 Morbid (severe) obesity due to excess calories Facility Procedures : CPT4 Code: [id number] Description: 99213 - WOUND CARE VISIT-LEV 3 EST PT Modifier: 25 Quantity: 1 : CPT4 Code: [id number] Description: (Facility Use Only) 41324MW - APPLY MULTLAY COMPRS LWR RT LEG Modifier: Quantity: 1 Physician Procedures : CPT4 Code Description Modifier [id number] - WC PHYS LEVEL 4 - NEW PT ICD-10 Diagnosis Description L97.812 Non-pressure chronic ulcer of other part of right lower leg with fat layer exposed T79.A0XS Compartment syndrome, unspecified, sequela R60.0  Localized edema E66.01 Morbid (severe) obesity due to excess calories Quantity: 1 Electronic Signature(s) Signed: 12/29/2022 10:48:59 AM By: Duanne Guess MD FACS Signed: 12/29/2022 3:46:04 PM By: Zenaida Deed RN, BSN Previous Signature: 12/29/2022 10:17:17 AM Version By: Duanne Guess MD FACS Entered By: Zenaida Deed on 12/29/2022 10:19:30

## 2022-12-29 NOTE — Progress Notes (Addendum)
RAYCEN, LIBONATI ([id number]) [id number] Page 1 of 9 Visit Report for 12/29/2022 Allergy List Details Patient Name: Date of Service: [name], [name] 12/29/2022 9:00 A M Medical Record Number: [id number] Patient Account Number: [id number] Date of Birth/Sex: Treating RN: 06/04/1945 (77 y.o. [name] Primary Care Adolphe Fortunato: Antony Haste Other Clinician: Referring Inioluwa Boulay: Treating Shane Melby/Extender: Polly Cobia Weeks in Treatment: 0 Allergies Active Allergies codeine Reaction: headaches Keflex Reaction: rash Allergy Notes Electronic Signature(s) Signed: 12/29/2022 3:46:04 PM By: Zenaida Deed RN, BSN Entered By: Zenaida Deed on 12/29/2022 09:27:11 -------------------------------------------------------------------------------- Arrival Information Details Patient Name: Date of Service: [name], [name] 12/29/2022 9:00 A M Medical Record Number: [id number] Patient Account Number: [id number] Date of Birth/Sex: Treating RN: 02/07/1946 (77 y.o. M) Primary Care Marquay Kruse: Antony Haste Other Clinician: Referring Jonael Paradiso: Treating Ahaana Rochette/Extender: Kern Reap in Treatment: 0 Visit Information Patient Arrived: Ambulatory Arrival Time: 08:41 Accompanied By: self Transfer Assistance: None Patient Identification Verified: Yes Secondary Verification Process Completed: Yes Patient Requires Transmission-Based Precautions: No Patient Has Alerts: Yes Patient Alerts: R ABI=1.09, TBI=.88 L ABI=1.07, TBI= .71 Electronic Signature(s) Signed: 12/29/2022 3:46:04 PM By: Zenaida Deed RN, BSN Previous Signature: 12/29/2022 9:09:12 AM Version By: Dayton Scrape Entered By: Zenaida Deed on 12/29/2022 09:51:43 Bramer, Marilu Favre ([id number]) [id number] Page 2 of 9 -------------------------------------------------------------------------------- Clinic Level of Care  Assessment Details Patient Name: Date of Service: [name], [name] 12/29/2022 9:00 A M Medical Record Number: [id number] Patient Account Number: [id number] Date of Birth/Sex: Treating RN: April 05, 1946 (77 y.o. [name] Primary Care Giann Obara: Antony Haste Other Clinician: Referring Maryalyce Sanjuan: Treating Samrat Hayward/Extender: Kern Reap in Treatment: 0 Clinic Level of Care Assessment Items TOOL 1 Quantity Score []  - 0 Use when EandM and Procedure is performed on INITIAL visit ASSESSMENTS - Nursing Assessment / Reassessment X- 1 20 General Physical Exam (combine w/ comprehensive assessment (listed just below) when performed on new pt. evals) X- 1 25 Comprehensive Assessment (HX, ROS, Risk Assessments, Wounds Hx, etc.) ASSESSMENTS - Wound and Skin Assessment / Reassessment []  - 0 Dermatologic / Skin Assessment (not related to wound area) ASSESSMENTS - Ostomy and/or Continence Assessment and Care []  - 0 Incontinence Assessment and Management []  - 0 Ostomy Care Assessment and Management (repouching, etc.) PROCESS - Coordination of Care X - Simple Patient / Family Education for ongoing care 1 15 []  - 0 Complex (extensive) Patient / Family Education for ongoing care X- 1 10 Staff obtains Chiropractor, Records, T Results / Process Orders est []  - 0 Staff telephones HHA, Nursing Homes / Clarify orders / etc []  - 0 Routine Transfer to another Facility (non-emergent condition) []  - 0 Routine Hospital Admission (non-emergent condition) X- 1 15 New Admissions / Manufacturing engineer / Ordering NPWT Apligraf, etc. , []  - 0 Emergency Hospital Admission (emergent condition) PROCESS - Special Needs []  - 0 Pediatric / Minor Patient Management []  - 0 Isolation Patient Management []  - 0 Hearing / Language / Visual special needs []  - 0 Assessment of Community assistance (transportation, D/C planning, etc.) []  - 0 Additional assistance / Altered  mentation []  - 0 Support Surface(s) Assessment (bed, cushion, seat, etc.) INTERVENTIONS - Miscellaneous []  - 0 External ear exam []  - 0 Patient Transfer (multiple staff / Nurse, adult / Similar devices) []  - 0 Simple Staple / Suture removal (25 or less) []  - 0 Complex Staple / Suture removal (26 or more) []  - 0 Hypo/Hyperglycemic Management (do not check if billed separately) []  - 0 Ankle /  Brachial Index (ABI) - do not check if billed separately Has the patient been seen at the hospital within the last three years: Yes Total Score: 85 Level Of Care: New/Established - Level 3 Electronic Signature(s) Signed: 12/29/2022 3:46:04 PM By: Zenaida Deed RN, BSN [place], [place] [phone]: Zenaida Deed RN, BSN [phone].pdf Page 3 of 9 Signed: 12/29/2022 3:46:04 PM Entered By: Zenaida Deed on 12/29/2022 10:19:07 -------------------------------------------------------------------------------- Compression Therapy Details Patient Name: Date of Service: [name], [name] 12/29/2022 9:00 A M Medical Record Number: [id number] Patient Account Number: [id number] Date of Birth/Sex: Treating RN: 07-09-1945 (77 y.o. [name] Primary Care Anisha Starliper: Antony Haste Other Clinician: Referring Almendra Loria: Treating Isadora Delorey/Extender: Kern Reap in Treatment: 0 Compression Therapy Performed for Wound Assessment: Wound #1 Right,Medial Lower Leg Performed By: Clinician Zenaida Deed, RN Compression Type: Double Layer Post Procedure Diagnosis Same as Pre-procedure Notes Stephannie Li Electronic Signature(s) Signed: 12/29/2022 3:46:04 PM By: Zenaida Deed RN, BSN Entered By: Zenaida Deed on 12/29/2022 10:00:25 -------------------------------------------------------------------------------- Encounter Discharge Information Details Patient Name: Date of Service: [name], [name] 12/29/2022 9:00 A M Medical Record Number:  [id number] Patient Account Number: [id number] Date of Birth/Sex: Treating RN: 21-Apr-1946 (77 y.o. [name] Primary Care Skyy Nilan: Antony Haste Other Clinician: Referring Jany Buckwalter: Treating Darrian Grzelak/Extender: Kern Reap in Treatment: 0 Encounter Discharge Information Items Discharge Condition: Stable Ambulatory Status: Ambulatory Discharge Destination: Home Transportation: Private Auto Accompanied By: self Schedule Follow-up Appointment: Yes Clinical Summary of Care: Patient Declined Electronic Signature(s) Signed: 12/29/2022 3:46:04 PM By: Zenaida Deed RN, BSN Entered By: Zenaida Deed on 12/29/2022 10:20:22 -------------------------------------------------------------------------------- Lower Extremity Assessment Details Patient Name: Date of Service: [name], [name] 12/29/2022 9:00 A ARCADIO, CRUTE ([id number]) [id number] Page 4 of 9 Medical Record Number: [id number] Patient Account Number: [id number] Date of Birth/Sex: Treating RN: Sep 18, 1945 (77 y.o. [name] Primary Care Tangie Stay: Antony Haste Other Clinician: Referring Thatiana Renbarger: Treating Carolin Quang/Extender: Kern Reap in Treatment: 0 Edema Assessment Assessed: Kyra Searles: No] [Right: No] Edema: [Left: Ye] [Right: s] Calf Left: Right: Point of Measurement: From Medial Instep 38.5 cm Ankle Left: Right: Point of Measurement: From Medial Instep 26 cm Knee To Floor Left: Right: From Medial Instep 43 cm Vascular Assessment Pulses: Dorsalis Pedis Palpable: [Right:Yes] Extremity colors, hair growth, and conditions: Extremity Color: [Right:Normal] Hair Growth on Extremity: [Right:Yes] Temperature of Extremity: [Right:Warm] Capillary Refill: [Right:< 3 seconds] Dependent Rubor: [Right:No] Blanched when Elevated: [Right:No No] Toe Nail Assessment Left: Right: Thick: Yes Discolored: Yes Deformed:  Yes Improper Length and Hygiene: Yes Electronic Signature(s) Signed: 12/29/2022 3:46:04 PM By: Zenaida Deed RN, BSN Entered By: Zenaida Deed on 12/29/2022 09:41:03 -------------------------------------------------------------------------------- Multi Wound Chart Details Patient Name: Date of Service: [name], [name] 12/29/2022 9:00 A M Medical Record Number: [id number] Patient Account Number: [id number] Date of Birth/Sex: Treating RN: [birth year] (77 y.o. M) Primary Care Danniella Robben: Antony Haste Other Clinician: Referring Talyn Dessert: Treating Camarie Mctigue/Extender: Kern Reap in Treatment: 0 Vital Signs Height(in): 68 Pulse(bpm): 69 Weight(lbs): 280 Blood Pressure(mmHg): 159/81 Body Mass Index(BMI): 42.6 Temperature(F): 97.6 Respiratory Rate(breaths/min): 18 Marzano, Maxum ([id number]) [id number] Page 5 of 9 [1:Photos:] [N/A:N/A] Right, Medial Lower Leg N/A N/A Wound Location: Not Known N/A N/A Wounding Event: T be determined o N/A N/A Primary Etiology: 01/24/2021 N/A N/A Date Acquired: 0 N/A N/A Weeks of Treatment: Open N/A N/A Wound Status: No N/A N/A Wound Recurrence: 0.5x2x0.1 N/A N/A Measurements L x W x D (cm) 0.785 N/A N/A A (cm) : rea  0.079 N/A N/A Volume (cm) : Unclassifiable N/A N/A Classification: Treatment Notes Electronic Signature(s) Signed: 12/29/2022 9:14:26 AM By: Duanne Guess MD FACS Entered By: Duanne Guess on 12/29/2022 09:14:25 -------------------------------------------------------------------------------- Multi-Disciplinary Care Plan Details Patient Name: Date of Service: [name], [name] 12/29/2022 9:00 A M Medical Record Number: [id number] Patient Account Number: [id number] Date of Birth/Sex: Treating RN: Oct 05, 1945 (77 y.o. [name] Primary Care Wilfred Siverson: Antony Haste Other Clinician: Referring Caswell Alvillar: Treating Janaysha Depaulo/Extender: Kern Reap in Treatment: 0 Multidisciplinary Care Plan reviewed with physician Active Inactive Venous Leg Ulcer Nursing Diagnoses: Knowledge deficit related to disease process and management Potential for venous Insuffiency (use before diagnosis confirmed) Goals: Patient will maintain optimal edema control Date Initiated: 12/29/2022 Target Resolution Date: 01/26/2023 Goal Status: Active Interventions: Assess peripheral edema status every visit. Compression as ordered Provide education on venous insufficiency Treatment Activities: Therapeutic compression applied : 12/29/2022 Notes: Wound/Skin Impairment RIDHA, DELLIGATTI ([id number]) [id number] Page 6 of 9 Nursing Diagnoses: Impaired tissue integrity Knowledge deficit related to ulceration/compromised skin integrity Goals: Patient/caregiver will verbalize understanding of skin care regimen Date Initiated: 12/29/2022 Target Resolution Date: 01/26/2023 Goal Status: Active Ulcer/skin breakdown will have a volume reduction of 30% by week 4 Date Initiated: 12/29/2022 Target Resolution Date: 01/26/2023 Goal Status: Active Interventions: Assess patient/caregiver ability to obtain necessary supplies Assess patient/caregiver ability to perform ulcer/skin care regimen upon admission and as needed Assess ulceration(s) every visit Provide education on ulcer and skin care Treatment Activities: Skin care regimen initiated : 12/29/2022 Topical wound management initiated : 12/29/2022 Notes: Electronic Signature(s) Signed: 12/29/2022 3:46:04 PM By: Zenaida Deed RN, BSN Entered By: Zenaida Deed on 12/29/2022 10:18:42 -------------------------------------------------------------------------------- Pain Assessment Details Patient Name: Date of Service: [name], [name] 12/29/2022 9:00 A M Medical Record Number: [id number] Patient Account Number: [id number] Date of Birth/Sex: Treating  RN: 1946/01/27 (77 y.o. [name] Primary Care Esaiah Wanless: Antony Haste Other Clinician: Referring Noe Pittsley: Treating Wells Mabe/Extender: Kern Reap in Treatment: 0 Active Problems Location of Pain Severity and Description of Pain Patient Has Paino Yes Site Locations Pain Location: Pain in Ulcers With Dressing Change: Yes Duration of the Pain. Constant / Intermittento Constant Rate the pain. Current Pain Level: 4 Worst Pain Level: 5 Least Pain Level: 2 Character of Pain Describe the Pain: Burning Pain Management and Medication Current Pain Management: Medication: Yes Is the Current Pain Management Adequate: Adequate JUVENTINO, GOHMAN ([id number]) [id number] Page 7 of 9 How does your wound impact your activities of daily livingo Sleep: Yes Bathing: No Appetite: No Relationship With Others: No Bladder Continence: No Emotions: No Bowel Continence: No Work: No Toileting: No Drive: No Dressing: No Hobbies: No Electronic Signature(s) Signed: 12/29/2022 3:46:04 PM By: Zenaida Deed RN, BSN Entered By: Zenaida Deed on 12/29/2022 09:47:52 -------------------------------------------------------------------------------- Patient/Caregiver Education Details Patient Name: Date of Service: [name], [name] 8/29/2024andnbsp9:00 A M Medical Record Number: [id number] Patient Account Number: [id number] Date of Birth/Gender: Treating RN: 1945-11-18 (77 y.o. [name] Primary Care Physician: Antony Haste Other Clinician: Referring Physician: Treating Physician/Extender: Kern Reap in Treatment: 0 Education Assessment Education Provided To: Patient Education Topics Provided Venous: Handouts: Controlling Swelling with Compression Stockings, Management of Venous Insufficiency Methods: Explain/Verbal, Printed Responses: Reinforcements needed, State content correctly Welcome  T [facility]-New Patient Packet: o Handouts: Welcome T [facility] o Methods: Explain/Verbal, Printed Responses: Reinforcements needed, State content correctly Wound/Skin Impairment: Handouts: Caring for Your Ulcer Methods: Explain/Verbal, Printed Responses: Reinforcements needed, State content  correctly Electronic Signature(s) Signed: 12/29/2022 3:46:04 PM By: Zenaida Deed RN, BSN Entered By: Zenaida Deed on 12/29/2022 09:48:53 -------------------------------------------------------------------------------- Wound Assessment Details Patient Name: Date of Service: [name], [name] 12/29/2022 9:00 A M Medical Record Number: [id number] Patient Account Number: [id number] Date of Birth/Sex: Treating RN: 10/30/1945 (77 y.o. [name] Primary Care Kandra Graven: Antony Haste Other Clinician: Referring Kelten Enochs: Treating Mireyah Chervenak/Extender: Kern Reap in Treatment: 0 ASMIR, LAUERSDORF ([id number]) [id number] Page 8 of 9 Wound Status Wound Number: 1 Primary T be determined o Etiology: Wound Location: Right, Medial Lower Leg Wound Open Wounding Event: Not Known Status: Date Acquired: 01/24/2021 Comorbid Sleep Apnea, Congestive Heart Failure, Coronary Artery Disease, Weeks Of Treatment: 0 History: Hypertension, Myocardial Infarction Clustered Wound: No Photos Wound Measurements Length: (cm) 1 Width: (cm) 2.3 Depth: (cm) 0.1 Area: (cm) 1.806 Volume: (cm) 0.181 % Reduction in Area: % Reduction in Volume: Epithelialization: Large (67-100%) Tunneling: No Undermining: No Wound Description Classification: Full Thickness Without Exposed Support Wound Margin: Indistinct, nonvisible Exudate Amount: Medium Exudate Type: Serous Exudate Color: amber Structures Foul Odor After Cleansing: No Slough/Fibrino No Wound Bed Granulation Amount: Small (1-33%) Exposed Structure Granulation Quality:  Red Fascia Exposed: No Necrotic Amount: None Present (0%) Fat Layer (Subcutaneous Tissue) Exposed: Yes Tendon Exposed: No Muscle Exposed: No Joint Exposed: No Bone Exposed: No Periwound Skin Texture Texture Color No Abnormalities Noted: No No Abnormalities Noted: No Scarring: Yes Ecchymosis: Yes Moisture Temperature / Pain No Abnormalities Noted: Yes Temperature: No Abnormality Tenderness on Palpation: Yes Electronic Signature(s) Signed: 12/29/2022 3:46:04 PM By: Zenaida Deed RN, BSN Previous Signature: 12/29/2022 9:09:12 AM Version By: [place] Scrape Entered By: Zenaida Deed on 12/29/2022 09:45:47 -------------------------------------------------------------------------------- Vitals Details Patient Name: Date of Service: [name], [name] 12/29/2022 9:00 A M Medical Record Number: [id number] Patient Account Number: [id number] Date of Birth/Sex: Treating RN: 1946/04/09 (77 y.o. M) Primary Care Elyssia Strausser: Antony Haste Other Clinician: Referring Quiana Cobaugh: Treating Kanon Colunga/Extender: Polly Cobia [place], Marilu Favre ([id number]) [id number] Page 9 of 9 Weeks in Treatment: 0 Vital Signs Time Taken: 08:41 Temperature (F): 97.6 Height (in): 68 Pulse (bpm): 69 Weight (lbs): 280 Respiratory Rate (breaths/min): 18 Body Mass Index (BMI): 42.6 Blood Pressure (mmHg): 159/81 Reference Range: 80 - 120 mg / dl Electronic Signature(s) Signed: 12/29/2022 3:46:04 PM By: Zenaida Deed RN, BSN Previous Signature: 12/29/2022 9:09:12 AM Version By: [place] Scrape Entered By: Zenaida Deed on 12/29/2022 09:26:41

## 2022-12-31 ENCOUNTER — Encounter (HOSPITAL_BASED_OUTPATIENT_CLINIC_OR_DEPARTMENT_OTHER): Payer: Self-pay | Admitting: Family

## 2023-01-06 ENCOUNTER — Encounter (HOSPITAL_BASED_OUTPATIENT_CLINIC_OR_DEPARTMENT_OTHER): Payer: Medicare Other | Attending: General Surgery | Admitting: General Surgery

## 2023-01-06 DIAGNOSIS — L97812 Non-pressure chronic ulcer of other part of right lower leg with fat layer exposed: Secondary | ICD-10-CM | POA: Insufficient documentation

## 2023-01-06 DIAGNOSIS — I872 Venous insufficiency (chronic) (peripheral): Secondary | ICD-10-CM | POA: Diagnosis not present

## 2023-01-06 DIAGNOSIS — E785 Hyperlipidemia, unspecified: Secondary | ICD-10-CM | POA: Insufficient documentation

## 2023-01-06 DIAGNOSIS — I2510 Atherosclerotic heart disease of native coronary artery without angina pectoris: Secondary | ICD-10-CM | POA: Diagnosis not present

## 2023-01-06 DIAGNOSIS — I110 Hypertensive heart disease with heart failure: Secondary | ICD-10-CM | POA: Diagnosis not present

## 2023-01-06 DIAGNOSIS — I890 Lymphedema, not elsewhere classified: Secondary | ICD-10-CM | POA: Diagnosis not present

## 2023-01-06 DIAGNOSIS — I509 Heart failure, unspecified: Secondary | ICD-10-CM | POA: Diagnosis not present

## 2023-01-06 DIAGNOSIS — I272 Pulmonary hypertension, unspecified: Secondary | ICD-10-CM | POA: Diagnosis not present

## 2023-01-06 DIAGNOSIS — Z833 Family history of diabetes mellitus: Secondary | ICD-10-CM | POA: Insufficient documentation

## 2023-01-06 DIAGNOSIS — G4730 Sleep apnea, unspecified: Secondary | ICD-10-CM | POA: Insufficient documentation

## 2023-01-10 NOTE — Progress Notes (Signed)
KEVAUN, BONEWITZ ([id number]) [id number] Page 1 of 8 Visit Report for 01/06/2023 Arrival Information Details Patient Name: Date of Service: [name], [name] 01/06/2023 8:30 A M Medical Record Number: [id number] Patient Account Number: [id number] Date of Birth/Sex: Treating RN: 1946/03/23 (77 y.o. [name] Primary Care [name]: [name] Other Clinician: Referring Claude Swendsen: Treating Ephram Kornegay/Extender: Molli Knock in Treatment: 1 Visit Information History Since Last Visit All ordered tests and consults were completed: Yes Patient Arrived: Ambulatory Added or deleted any medications: No Arrival Time: 08:15 Any new allergies or adverse reactions: No Accompanied By: self Had a fall or experienced change in No Transfer Assistance: None activities of daily living that may affect Patient Identification Verified: Yes risk of falls: Secondary Verification Process Completed: Yes Signs or symptoms of abuse/neglect since last visito No Patient Requires Transmission-Based Precautions: No Hospitalized since last visit: No Patient Has Alerts: Yes Implantable device outside of the clinic excluding No Patient Alerts: R ABI=1.09, TBI=.88 cellular tissue based products placed in the center L ABI=1.07, TBI= .71 since last visit: Has Dressing in Place as Prescribed: Yes Pain Present Now: No Electronic Signature(s) Signed: 01/10/2023 3:48:27 PM By: Brenton Grills Entered By: Brenton Grills on 01/06/2023 08:19:04 -------------------------------------------------------------------------------- Compression Therapy Details Patient Name: Date of Service: [name], [name] 01/06/2023 8:30 A M Medical Record Number: [id number] Patient Account Number: [id number] Date of Birth/Sex: Treating RN: 1945/06/12 (77 y.o. [name] Primary Care Emanuella Nickle: [name] Other Clinician: Referring Rahmah Mccamy: Treating Cooper Moroney/Extender:  Molli Knock in Treatment: 1 Compression Therapy Performed for Wound Assessment: Wound #1 Right,Medial Lower Leg Performed By: Clinician Brenton Grills, RN Compression Type: Three Layer Post Procedure Diagnosis Same as Pre-procedure Electronic Signature(s) Signed: 01/10/2023 3:48:27 PM By: Brenton Grills Entered By: Brenton Grills on 01/06/2023 08:58:01 [name] ([id number]) [id number] Page 2 of 8 -------------------------------------------------------------------------------- Encounter Discharge Information Details Patient Name: Date of Service: [name], [name] 01/06/2023 8:30 A M Medical Record Number: [id number] Patient Account Number: [id number] Date of Birth/Sex: Treating RN: 03/15/1946 (77 y.o. [name] Primary Care Myka Hitz: [name] Other Clinician: Referring Khye Hochstetler: Treating Demara Lover/Extender: Molli Knock in Treatment: 1 Encounter Discharge Information Items Post Procedure Vitals Discharge Condition: Stable Temperature (F): 98.1 Ambulatory Status: Ambulatory Pulse (bpm): 68 Discharge Destination: Home Respiratory Rate (breaths/min): 18 Transportation: Other Blood Pressure (mmHg): 158/70 Accompanied By: self Schedule Follow-up Appointment: Yes Clinical Summary of Care: Patient Declined Electronic Signature(s) Signed: 01/10/2023 3:48:27 PM By: Brenton Grills Entered By: Brenton Grills on 01/06/2023 09:09:27 -------------------------------------------------------------------------------- Lower Extremity Assessment Details Patient Name: Date of Service: [name], [name] 01/06/2023 8:30 A M Medical Record Number: [id number] Patient Account Number: [id number] Date of Birth/Sex: Treating RN: Dec 06, 1945 (77 y.o. [name] Primary Care Lania Zawistowski: [name] Other Clinician: Referring Phoenyx Melka: Treating Jearldean Gutt/Extender: Molli Knock in Treatment: 1 Edema Assessment Assessed: [name]: No] [name]: No] Edema: [Left: Ye] [Right: s] Calf Left: Right: Point of Measurement: From Medial Instep 38.5 cm Ankle Left: Right: Point of Measurement: From Medial Instep 26 cm Vascular Assessment Pulses: Dorsalis Pedis Palpable: [Right:Yes] Extremity colors, hair growth, and conditions: Extremity Color: [Right:Normal] Hair Growth on Extremity: [Right:Yes] Temperature of Extremity: [Right:Warm] Capillary Refill: [Right:< 3 seconds] Dependent Rubor: [Right:No No] Toe Nail Assessment Left: Right: Thick: Yes Discolored: Yes Deformed: Yes Improper Length and HygieneARISTOTLE, [name] ([id number]) [id number] Page 3 of 8 Electronic Signature(s) Signed: 01/10/2023 3:48:27 PM By: Brenton Grills Entered By: Brenton Grills on 01/06/2023 08:24:19 --------------------------------------------------------------------------------  Multi Wound Chart Details Patient Name: Date of Service: [name], [name] 01/06/2023 8:30 A M Medical Record Number: [id number] Patient Account Number: [id number] Date of Birth/Sex: Treating RN: 1946/03/14 (77 y.o. M) Primary Care Deijah Spikes: [name] Other Clinician: Referring Katherleen Folkes: Treating Sebastin Perlmutter/Extender: Molli Knock in Treatment: 1 Vital Signs Height(in): 68 Pulse(bpm): 71 Weight(lbs): 280 Blood Pressure(mmHg): 148/81 Body Mass Index(BMI): 42.6 Temperature(F): 97.5 Respiratory Rate(breaths/min): 18 [1:Photos:] [N/A:N/A] Right, Medial Lower Leg N/A N/A Wound Location: Gradually Appeared N/A N/A Wounding Event: Venous Leg Ulcer N/A N/A Primary Etiology: Sleep Apnea, Congestive Heart N/A N/A Comorbid History: Failure, Coronary Artery Disease, Hypertension, Myocardial Infarction 01/24/2021 N/A N/A Date Acquired: 1 N/A N/A Weeks of Treatment: Open N/A N/A Wound Status: No N/A N/A Wound  Recurrence: 0.5x0.4x0.1 N/A N/A Measurements L x W x D (cm) 0.157 N/A N/A A (cm) : rea 0.016 N/A N/A Volume (cm) : 91.30% N/A N/A % Reduction in A rea: 91.20% N/A N/A % Reduction in Volume: Full Thickness Without Exposed N/A N/A Classification: Support Structures Medium N/A N/A Exudate A mount: Serous N/A N/A Exudate Type: amber N/A N/A Exudate Color: Indistinct, nonvisible N/A N/A Wound Margin: Small (1-33%) N/A N/A Granulation A mount: Red N/A N/A Granulation Quality: None Present (0%) N/A N/A Necrotic A mount: Fat Layer (Subcutaneous Tissue): Yes N/A N/A Exposed Structures: Fascia: No Tendon: No Muscle: No Joint: No Bone: No Large (67-100%) N/A N/A Epithelialization: Debridement - Selective/Open Wound N/A N/A Debridement: Pre-procedure Verification/Time Out 08:56 N/A N/A Taken: Lidocaine 4% Topical Solution N/A N/A Pain Control: Necrotic/Eschar N/A N/A Tissue Debrided: Non-Viable Tissue N/A N/A Level: 0.16 N/A N/A Debridement A (sq cm): rea Curette N/A N/A Instrument: Minimum N/A N/A Bleeding: [name], [name] ([id number]) [id number].pdf Page 4 of 8 Pressure N/A N/A Hemostasis A chieved: Procedure was tolerated well N/A N/A Debridement Treatment Response: 0.5x0.4x0.1 N/A N/A Post Debridement Measurements L x W x D (cm) 0.016 N/A N/A Post Debridement Volume: (cm) Scarring: Yes N/A N/A Periwound Skin Texture: No Abnormalities Noted N/A N/A Periwound Skin Moisture: Ecchymosis: Yes N/A N/A Periwound Skin Color: No Abnormality N/A N/A Temperature: Yes N/A N/A Tenderness on Palpation: Compression Therapy N/A N/A Procedures Performed: Debridement Treatment Notes Electronic Signature(s) Signed: 01/06/2023 9:03:01 AM By: [name] Guess MD FACS Entered By: [name] on 01/06/2023 09:03:00 -------------------------------------------------------------------------------- Multi-Disciplinary Care Plan  Details Patient Name: Date of Service: [name], [name] 01/06/2023 8:30 A M Medical Record Number: [id number] Patient Account Number: [id number] Date of Birth/Sex: Treating RN: 1945-12-25 (77 y.o. [name] Primary Care Gwendalyn Mcgonagle: [name] Other Clinician: Referring Essance Gatti: Treating Yumi Insalaco/Extender: Molli Knock in Treatment: 1 Multidisciplinary Care Plan reviewed with physician Active Inactive Venous Leg Ulcer Nursing Diagnoses: Knowledge deficit related to disease process and management Potential for venous Insuffiency (use before diagnosis confirmed) Goals: Patient will maintain optimal edema control Date Initiated: 12/29/2022 Target Resolution Date: 01/26/2023 Goal Status: Active Interventions: Assess peripheral edema status every visit. Compression as ordered Provide education on venous insufficiency Treatment Activities: Therapeutic compression applied : 12/29/2022 Notes: Wound/Skin Impairment Nursing Diagnoses: Impaired tissue integrity Knowledge deficit related to ulceration/compromised skin integrity Goals: Patient/caregiver will verbalize understanding of skin care regimen Date Initiated: 12/29/2022 Target Resolution Date: 01/26/2023 Goal Status: Active Ulcer/skin breakdown will have a volume reduction of 30% by week 4 Date Initiated: 12/29/2022 Target Resolution Date: 01/26/2023 Goal Status: Active [name], [name] ([id number]) [id number] Page 5 of 8 Interventions: Assess patient/caregiver ability to obtain necessary supplies Assess patient/caregiver ability to perform ulcer/skin care  regimen upon admission and as needed Assess ulceration(s) every visit Provide education on ulcer and skin care Treatment Activities: Skin care regimen initiated : 12/29/2022 Topical wound management initiated : 12/29/2022 Notes: Electronic Signature(s) Signed: 01/10/2023 3:48:27 PM By: Brenton Grills Entered  By: Brenton Grills on 01/06/2023 16:10:96 -------------------------------------------------------------------------------- Pain Assessment Details Patient Name: Date of Service: [name], [name] 01/06/2023 8:30 A M Medical Record Number: [id number] Patient Account Number: [id number] Date of Birth/Sex: Treating RN: [birth year] (77 y.o. [name] Primary Care Damarco Keysor: [name] Other Clinician: Referring Jennah Satchell: Treating [name]/Extender: Molli Knock in Treatment: 1 Active Problems Location of Pain Severity and Description of Pain Patient Has Paino No Site Locations Pain Management and Medication Current Pain Management: Electronic Signature(s) Signed: 01/10/2023 3:48:27 PM By: Brenton Grills Entered By: Brenton Grills on 01/06/2023 08:19:54 Patient/Caregiver Education Details -------------------------------------------------------------------------------- [name] ([id number]) [id number] Page 6 of 8 Patient Name: Date of Service: [name], [name] 9/6/2024andnbsp8:30 A M Medical Record Number: [id number] Patient Account Number: [id number] Date of Birth/Gender: Treating RN: [birth year] (77 y.o. [name] Primary Care Physician: [name] Other Clinician: Referring Physician: Treating Physician/Extender: Molli Knock in Treatment: 1 Education Assessment Education Provided To: Patient Education Topics Provided Wound/Skin Impairment: Methods: Explain/Verbal Responses: State content correctly Electronic Signature(s) Signed: 01/10/2023 3:48:27 PM By: Brenton Grills Entered By: Brenton Grills on 01/06/2023 08:28:46 -------------------------------------------------------------------------------- Wound Assessment Details Patient Name: Date of Service: [name], [name] 01/06/2023 8:30 A M Medical Record Number: [id number] Patient Account Number: [id number] Date  of Birth/Sex: Treating RN: Sep 08, 1945 (77 y.o. [name] Primary Care Cova Knieriem: [name] Other Clinician: Referring [name]: Treating [name]/Extender: Molli Knock in Treatment: 1 Wound Status Wound Number: 1 Primary Venous Leg Ulcer Etiology: Wound Location: Right, Medial Lower Leg Wound Open Wounding Event: Gradually Appeared Status: Date Acquired: 01/24/2021 Comorbid Sleep Apnea, Congestive Heart Failure, Coronary Artery Disease, Weeks Of Treatment: 1 History: Hypertension, Myocardial Infarction Clustered Wound: No Photos Wound Measurements Length: (cm) 0.5 Width: (cm) 0.4 Depth: (cm) 0.1 Area: (cm) 0.157 Volume: (cm) 0.016 % Reduction in Area: 91.3% % Reduction in Volume: 91.2% Epithelialization: Large (67-100%) Tunneling: No Undermining: No Wound Description Classification: Full Thickness Without Exposed Support Structures Wound Margin: Indistinct, nonvisible Exudate Amount: Medium Carillo, Nikoli ([id number]) Exudate Type: Serous Exudate Color: amber Foul Odor After Cleansing: No Slough/Fibrino No [id number] Page 7 of 8 Wound Bed Granulation Amount: Small (1-33%) Exposed Structure Granulation Quality: Red Fascia Exposed: No Necrotic Amount: None Present (0%) Fat Layer (Subcutaneous Tissue) Exposed: Yes Tendon Exposed: No Muscle Exposed: No Joint Exposed: No Bone Exposed: No Periwound Skin Texture Texture Color No Abnormalities Noted: No No Abnormalities Noted: No Scarring: Yes Ecchymosis: Yes Moisture Temperature / Pain No Abnormalities Noted: Yes Temperature: No Abnormality Tenderness on Palpation: Yes Treatment Notes Wound #1 (Lower Leg) Wound Laterality: Right, Medial Cleanser Peri-Wound Care Triamcinolone 15 (g) Discharge Instruction: Use triamcinolone 15 (g) as directed Sween Lotion (Moisturizing lotion) Discharge Instruction: Apply moisturizing lotion as  directed Topical Primary Dressing Maxorb Extra Calcium Alginate, 2x2 (in/in) Discharge Instruction: Apply to wound bed as instructed Secondary Dressing Woven Gauze Sponge, Non-Sterile 4x4 in Discharge Instruction: Apply over primary dressing as directed. Secured With Compression Wrap Urgo K2 Lite, (equivalent to a 3 layer) two layer compression system, regular Discharge Instruction: Apply Urgo K2 Lite as directed (alternative to 3 layer compression). Compression Stockings Add-Ons Electronic Signature(s) Signed: 01/10/2023 3:48:27 PM By: Brenton Grills Entered By: Brenton Grills on 01/06/2023 08:27:29 --------------------------------------------------------------------------------  Vitals Details Patient Name: Date of Service: [name], [name] 01/06/2023 8:30 A M Medical Record Number: [id number] Patient Account Number: [id number] Date of Birth/Sex: Treating RN: 04/22/1946 (77 y.o. [name] Primary Care Jakia Kennebrew: [name] Other Clinician: Referring Noora Locascio: Treating [name]/Extender: Molli Knock in Treatment: 1 Vital Signs [name], DECARLI ([id number]) [id number] Page 8 of 8 Time Taken: 08:19 Temperature (F): 97.5 Height (in): 68 Pulse (bpm): 71 Weight (lbs): 280 Respiratory Rate (breaths/min): 18 Body Mass Index (BMI): 42.6 Blood Pressure (mmHg): 148/81 Reference Range: 80 - 120 mg / dl Electronic Signature(s) Signed: 01/10/2023 3:48:27 PM By: Brenton Grills Entered By: Brenton Grills on 01/06/2023 08:19:45

## 2023-01-10 NOTE — Progress Notes (Signed)
[name], [name] ([id number]) [id number] Page 1 of 9 Visit Report for 01/06/2023 Chief Complaint Document Details Patient Name: Date of Service: [name], [name] 01/06/2023 8:30 A M Medical Record Number: [id number] Patient Account Number: [id number] Date of Birth/Sex: Treating RN: Jun 08, 1945 (77 y.o. M) Primary Care Provider: Antony Haste Other Clinician: Referring Provider: Treating Provider/Extender: Molli Knock in Treatment: 1 Information Obtained from: Patient Chief Complaint Patient seen for complaints of Non-Healing Wound. Electronic Signature(s) Signed: 01/06/2023 9:03:06 AM By: Duanne Guess MD FACS Entered By: Duanne Guess on 01/06/2023 06:03:06 -------------------------------------------------------------------------------- Debridement Details Patient Name: Date of Service: [name], [name] 01/06/2023 8:30 A M Medical Record Number: [id number] Patient Account Number: [id number] Date of Birth/Sex: Treating RN: 07/05/1945 (77 y.o. Yates Decamp Primary Care Provider: Antony Haste Other Clinician: Referring Provider: Treating Provider/Extender: Molli Knock in Treatment: 1 Debridement Performed for Assessment: Wound #1 Right,Medial Lower Leg Performed By: Physician Duanne Guess, MD Debridement Type: Debridement Severity of Tissue Pre Debridement: Fat layer exposed Level of Consciousness (Pre-procedure): Awake and Alert Pre-procedure Verification/Time Out Yes - 08:56 Taken: Start Time: 08:57 Pain Control: Lidocaine 4% Topical Solution Percent of Wound Bed Debrided: 100% T Area Debrided (cm): otal 0.16 Tissue and other material debrided: Non-Viable, Eschar Level: Non-Viable Tissue Debridement Description: Selective/Open Wound Instrument: Curette Bleeding: Minimum Hemostasis Achieved: Pressure Response to Treatment: Procedure was tolerated well Level of  Consciousness (Post- Awake and Alert procedure): Post Debridement Measurements of Total Wound Length: (cm) 0.5 Width: (cm) 0.4 Depth: (cm) 0.1 Volume: (cm) 0.016 Character of Wound/Ulcer Post Debridement: Improved Severity of Tissue Post Debridement: Fat layer exposed Post Procedure Diagnosis [name], [name] ([id number]) [id number] Page 2 of 9 Same as Pre-procedure Notes Scribed for Dr Lady Gary by Brenton Grills RN Electronic Signature(s) Signed: 01/06/2023 9:17:10 AM By: Duanne Guess MD FACS Signed: 01/10/2023 3:48:27 PM By: Brenton Grills Entered By: Brenton Grills on 01/06/2023 05:57:44 -------------------------------------------------------------------------------- HPI Details Patient Name: Date of Service: [name], [name] 01/06/2023 8:30 A M Medical Record Number: [id number] Patient Account Number: [id number] Date of Birth/Sex: Treating RN: 03-Sep-1945 (77 y.o. M) Primary Care Provider: Antony Haste Other Clinician: Referring Provider: Treating Provider/Extender: Molli Knock in Treatment: 1 History of Present Illness HPI Description: ADMISSION 12/29/2022 Non-Invasive Vascular Imaging: (2022) +-------+-----------+-----------+------------+------------+ ABI/TBIT oday's ABIT oday's TBIPrevious ABIPrevious TBI +-------+-----------+-----------+------------+------------+ Right 1.09 0.88    +-------+-----------+-----------+------------+------------+ Left 1.07 0.71    +-------+-----------+-----------+------------+------------+ Venous Reflux Times: (2024) +--------------+---------+------+-----------+------------+--------+ RIGHT Reflux NoRefluxReflux TimeDiameter cmsComments    Yes     +--------------+---------+------+-----------+------------+--------+ CFV no      +--------------+---------+------+-----------+------------+--------+ FV mid no       +--------------+---------+------+-----------+------------+--------+ Popliteal no      +--------------+---------+------+-----------+------------+--------+ GSV at SFJ   yes  >500 ms  0.67   +--------------+---------+------+-----------+------------+--------+ GSV prox thighno    0.57   +--------------+---------+------+-----------+------------+--------+ GSV mid thigh no    0.46   +--------------+---------+------+-----------+------------+--------+ GSV dist thighno    0.42   +--------------+---------+------+-----------+------------+--------+ GSV at knee   yes  >500 ms  0.38   +--------------+---------+------+-----------+------------+--------+ GSV prox calf no    0.23   +--------------+---------+------+-----------+------------+--------+ SSV Pop Fossa no    0.29   +--------------+---------+------+-----------+------------+--------+ SSV prox calf no    0.19   +--------------+---------+------+-----------+------------+--------+ This is a 77 year old non diabetic who underwent 4 compartment fasciotomy in 2022 after striking his leg on a car door. He required skin grafting and his wounds have been slow to heal. Arterial studies done after his surgery are copied above with adequate ABIs for healing, at  least at that time. He recently had venous reflux studies done to see if that could be the etiology for his slow wound healing as well, but his reflux is not significant and vascular surgery does not think he would benefit from ablation. He is fairly inactive, secondary to his obesity. He does not wear compression stockings regularly, but has them on today. Vascular surgery referred him to the wound care center for further evaluation and management. 01/06/2023: The only remaining open areas are very superficial and small. Edema control is excellent. Even the skin overlying the skin graft site has improved. Electronic Signature(s) Signed:  01/06/2023 9:03:37 AM By: Duanne Guess MD FACS [place], [name] ([id number]) [id number] Page 3 of 9 Entered By: Duanne Guess on 01/06/2023 06:03:37 -------------------------------------------------------------------------------- Physical Exam Details Patient Name: Date of Service: [name], [name] 01/06/2023 8:30 A M Medical Record Number: [id number] Patient Account Number: [id number] Date of Birth/Sex: Treating RN: 03-18-1946 (77 y.o. M) Primary Care Provider: Antony Haste Other Clinician: Referring Provider: Treating Provider/Extender: Molli Knock in Treatment: 1 Constitutional Slightly hypertensive. . . . Marland Kitchen Respiratory Normal work of breathing on room air. Notes 01/06/2023: The only remaining open areas are very superficial and small. Edema control is excellent. Even the skin overlying the skin graft site has improved. Electronic Signature(s) Signed: 01/06/2023 9:05:22 AM By: Duanne Guess MD FACS Entered By: Duanne Guess on 01/06/2023 06:05:22 -------------------------------------------------------------------------------- Physician Orders Details Patient Name: Date of Service: [name], [name] 01/06/2023 8:30 A M Medical Record Number: [id number] Patient Account Number: [id number] Date of Birth/Sex: Treating RN: [birth year] (77 y.o. Yates Decamp Primary Care Provider: Antony Haste Other Clinician: Referring Provider: Treating Provider/Extender: Molli Knock in Treatment: 1 Verbal / Phone Orders: No Diagnosis Coding ICD-10 Coding Code Description [phone] Non-pressure chronic ulcer of other part of right lower leg with fat layer exposed T79.A0XS Compartment syndrome, unspecified, sequela R60.0 Localized edema E66.01 Morbid (severe) obesity due to excess calories Follow-up Appointments ppointment in 1 week. - Dr. Lady Gary RM 1- please make appt. Return A Bathing/  Shower/ Hygiene May shower with protection but do not get wound dressing(s) wet. Protect dressing(s) with water repellant cover (for example, large plastic bag) or a cast cover and may then take shower. Edema Control - Lymphedema / SCD / Other Bilateral Lower Extremities Elevate legs to the level of the heart or above for 30 minutes daily and/or when sitting for 3-4 times a day throughout the day. Avoid standing for long periods of time. Exercise regularly Wound Treatment Wound #1 - Lower Leg Wound Laterality: Right, Medial ZYKEE, LOYA ([id number]) [phone].pdf Page 4 of 9 Peri-Wound Care: Triamcinolone 15 (g) 1 x Per Week Discharge Instructions: Use triamcinolone 15 (g) as directed Peri-Wound Care: Sween Lotion (Moisturizing lotion) 1 x Per Week Discharge Instructions: Apply moisturizing lotion as directed Prim Dressing: Maxorb Extra Calcium Alginate, 2x2 (in/in) 1 x Per Week ary Discharge Instructions: Apply to wound bed as instructed Secondary Dressing: Woven Gauze Sponge, Non-Sterile 4x4 in 1 x Per Week Discharge Instructions: Apply over primary dressing as directed. Compression Wrap: Urgo K2 Lite, (equivalent to a 3 layer) two layer compression system, regular 1 x Per Week Discharge Instructions: Apply Urgo K2 Lite as directed (alternative to 3 layer compression). Electronic Signature(s) Signed: 01/06/2023 9:17:10 AM By: Duanne Guess MD FACS Signed: 01/10/2023 3:48:27 PM By: Brenton Grills Entered By: Brenton Grills on 01/06/2023 06:05:59 -------------------------------------------------------------------------------- Problem List Details Patient Name: Date of Service: [name], [name] 01/06/2023 8:30  A M Medical Record Number: [id number] Patient Account Number: [id number] Date of Birth/Sex: Treating RN: 09-22-1945 (77 y.o. Yates Decamp Primary Care Provider: Antony Haste Other Clinician: Referring Provider: Treating Provider/Extender:  Molli Knock in Treatment: 1 Active Problems ICD-10 Encounter Code Description Active Date MDM Diagnosis [phone] Non-pressure chronic ulcer of other part of right lower leg with fat layer 12/29/2022 No Yes exposed T79.A0XS Compartment syndrome, unspecified, sequela 12/29/2022 No Yes R60.0 Localized edema 12/29/2022 No Yes E66.01 Morbid (severe) obesity due to excess calories 12/29/2022 No Yes Inactive Problems Resolved Problems Electronic Signature(s) Signed: 01/06/2023 9:02:54 AM By: Duanne Guess MD FACS Entered By: Duanne Guess on 01/06/2023 06:02:54 [name], [name] ([id number]) [id number] Page 5 of 9 -------------------------------------------------------------------------------- Progress Note Details Patient Name: Date of Service: [name], [name] 01/06/2023 8:30 A M Medical Record Number: [id number] Patient Account Number: [id number] Date of Birth/Sex: Treating RN: October 19, 1945 (77 y.o. M) Primary Care Provider: Antony Haste Other Clinician: Referring Provider: Treating Provider/Extender: Molli Knock in Treatment: 1 Subjective Chief Complaint Information obtained from Patient Patient seen for complaints of Non-Healing Wound. History of Present Illness (HPI) ADMISSION 12/29/2022 Non-Invasive Vascular Imaging: (2022) +-------+-----------+-----------+------------+------------+ ABI/TBIT oday's ABIT oday's TBIPrevious ABIPrevious TBI +-------+-----------+-----------+------------+------------+ Right 1.09 0.88    +-------+-----------+-----------+------------+------------+ Left 1.07 0.71    +-------+-----------+-----------+------------+------------+ Venous Reflux Times: (2024) +--------------+---------+------+-----------+------------+--------+ RIGHT Reflux NoRefluxReflux TimeDiameter cmsComments    Yes      +--------------+---------+------+-----------+------------+--------+ CFV no      +--------------+---------+------+-----------+------------+--------+ FV mid no      +--------------+---------+------+-----------+------------+--------+ Popliteal no      +--------------+---------+------+-----------+------------+--------+ GSV at SFJ   yes  >500 ms  0.67   +--------------+---------+------+-----------+------------+--------+ GSV prox thighno    0.57   +--------------+---------+------+-----------+------------+--------+ GSV mid thigh no    0.46   +--------------+---------+------+-----------+------------+--------+ GSV dist thighno    0.42   +--------------+---------+------+-----------+------------+--------+ GSV at knee   yes  >500 ms  0.38   +--------------+---------+------+-----------+------------+--------+ GSV prox calf no    0.23   +--------------+---------+------+-----------+------------+--------+ SSV Pop Fossa no    0.29   +--------------+---------+------+-----------+------------+--------+ SSV prox calf no    0.19   +--------------+---------+------+-----------+------------+--------+ This is a 77 year old non diabetic who underwent 4 compartment fasciotomy in 2022 after striking his leg on a car door. He required skin grafting and his wounds have been slow to heal. Arterial studies done after his surgery are copied above with adequate ABIs for healing, at least at that time. He recently had venous reflux studies done to see if that could be the etiology for his slow wound healing as well, but his reflux is not significant and vascular surgery does not think he would benefit from ablation. He is fairly inactive, secondary to his obesity. He does not wear compression stockings regularly, but has them on today. Vascular surgery referred him to the wound care center for further evaluation and management. 01/06/2023: The only  remaining open areas are very superficial and small. Edema control is excellent. Even the skin overlying the skin graft site has improved. Patient History Information obtained from Patient, Chart. Family History Cancer - Mother, Diabetes - Maternal Grandparents, Heart Disease - Father, Hypertension - Mother,Father, No family history of Hereditary Spherocytosis, Kidney Disease, Lung Disease, Seizures, Stroke, Thyroid Problems, Tuberculosis. Social History Former smoker - quit 1997, Marital Status - Married, Alcohol Use - Never, Drug Use - No History, Caffeine Use - Rarely. Medical History Eyes Denies history of Cataracts, Glaucoma, Optic Neuritis Respiratory [name], [name] ([id number]) [id number] Page 6 of 9 Patient has history of Sleep Apnea -  CPAP Cardiovascular Patient has history of Congestive Heart Failure, Coronary Artery Disease, Hypertension, Myocardial Infarction Endocrine Denies history of Type I Diabetes, Type II Diabetes Genitourinary Denies history of End Stage Renal Disease Integumentary (Skin) Denies history of History of Burn Oncologic Denies history of Received Chemotherapy, Received Radiation Psychiatric Denies history of Anorexia/bulimia, Confinement Anxiety Medical A Surgical History Notes nd Constitutional Symptoms (General Health) morbid obesity Ear/Nose/Mouth/Throat hard of hearing Cardiovascular hyperlipidemia, pulmonary hypertension Gastrointestinal colon polyp Genitourinary kidney stones Integumentary (Skin) seborrheic dermatitis Musculoskeletal hx compartment syndrome right lower leg Oncologic skin cancer removed Objective Constitutional Slightly hypertensive. Vitals Time Taken: 8:19 AM, Height: 68 in, Weight: 280 lbs, BMI: 42.6, Temperature: 97.5 F, Pulse: 71 bpm, Respiratory Rate: 18 breaths/min, Blood Pressure: 148/81 mmHg. Respiratory Normal work of breathing on room air. General Notes: 01/06/2023: The only  remaining open areas are very superficial and small. Edema control is excellent. Even the skin overlying the skin graft site has improved. Integumentary (Hair, Skin) Wound #1 status is Open. Original cause of wound was Gradually Appeared. The date acquired was: 01/24/2021. The wound has been in treatment 1 weeks. The wound is located on the Right,Medial Lower Leg. The wound measures 0.5cm length x 0.4cm width x 0.1cm depth; 0.157cm^2 area and 0.016cm^3 volume. There is Fat Layer (Subcutaneous Tissue) exposed. There is no tunneling or undermining noted. There is a medium amount of serous drainage noted. The wound margin is indistinct and nonvisible. There is small (1-33%) red granulation within the wound bed. There is no necrotic tissue within the wound bed. The periwound skin appearance had no abnormalities noted for moisture. The periwound skin appearance exhibited: Scarring, Ecchymosis. Periwound temperature was noted as No Abnormality. The periwound has tenderness on palpation. Assessment Active Problems ICD-10 Non-pressure chronic ulcer of other part of right lower leg with fat layer exposed Compartment syndrome, unspecified, sequela Localized edema Morbid (severe) obesity due to excess calories Procedures Wound #1 Pre-procedure diagnosis of Wound #1 is a Venous Leg Ulcer located on the Right,Medial Lower Leg .Severity of Tissue Pre Debridement is: Fat layer exposed. [name], [name] ([id number]) [id number] Page 7 of 9 There was a Selective/Open Wound Non-Viable Tissue Debridement with a total area of 0.16 sq cm performed by Duanne Guess, MD. With the following instrument(s): Curette to remove Non-Viable tissue/material. Material removed includes Eschar after achieving pain control using Lidocaine 4% Topical Solution. No specimens were taken. A time out was conducted at 08:56, prior to the start of the procedure. A Minimum amount of bleeding was controlled  with Pressure. The procedure was tolerated well. Post Debridement Measurements: 0.5cm length x 0.4cm width x 0.1cm depth; 0.016cm^3 volume. Character of Wound/Ulcer Post Debridement is improved. Severity of Tissue Post Debridement is: Fat layer exposed. Post procedure Diagnosis Wound #1: Same as Pre-Procedure General Notes: Scribed for Dr Lady Gary by Brenton Grills RN. Pre-procedure diagnosis of Wound #1 is a Venous Leg Ulcer located on the Right,Medial Lower Leg . There was a Three Layer Compression Therapy Procedure by Brenton Grills, RN. Post procedure Diagnosis Wound #1: Same as Pre-Procedure Plan Follow-up Appointments: Return Appointment in 1 week. - Dr. Lady Gary RM 1 Bathing/ Shower/ Hygiene: May shower with protection but do not get wound dressing(s) wet. Protect dressing(s) with water repellant cover (for example, large plastic bag) or a cast cover and may then take shower. Edema Control - Lymphedema / SCD / Other: Elevate legs to the level of the heart or above for 30 minutes daily and/or when sitting for 3-4 times a day  throughout the day. Avoid standing for long periods of time. Exercise regularly WOUND #1: - Lower Leg Wound Laterality: Right, Medial Peri-Wound Care: Triamcinolone 15 (g) 1 x Per Week/ Discharge Instructions: Use triamcinolone 15 (g) as directed Peri-Wound Care: Sween Lotion (Moisturizing lotion) 1 x Per Week/ Discharge Instructions: Apply moisturizing lotion as directed Prim Dressing: Maxorb Extra Calcium Alginate, 2x2 (in/in) 1 x Per Week/ ary Discharge Instructions: Apply to wound bed as instructed Secondary Dressing: Woven Gauze Sponge, Non-Sterile 4x4 in 1 x Per Week/ Discharge Instructions: Apply over primary dressing as directed. Com pression Wrap: Urgo K2 Lite, (equivalent to a 3 layer) two layer compression system, regular 1 x Per Week/ Discharge Instructions: Apply Urgo K2 Lite as directed (alternative to 3 layer compression). 01/06/2023: The only  remaining open areas are very superficial and small. Edema control is excellent. Even the skin overlying the skin graft site has improved. I used a curette to debride the eschar from his wounds. We will continue silver alginate with 3 layer compression equivalent. I expect his wounds will be completely healed at his visit next week. Electronic Signature(s) Signed: 01/06/2023 9:06:03 AM By: Duanne Guess MD FACS Entered By: Duanne Guess on 01/06/2023 06:06:03 -------------------------------------------------------------------------------- HxROS Details Patient Name: Date of Service: [name], [name] 01/06/2023 8:30 A M Medical Record Number: [id number] Patient Account Number: [id number] Date of Birth/Sex: Treating RN: 1945-06-30 (77 y.o. M) Primary Care Provider: Antony Haste Other Clinician: Referring Provider: Treating Provider/Extender: Molli Knock in Treatment: 1 Information Obtained From Patient Chart Constitutional Symptoms (General Health) Medical History: Past Medical History Notes: morbid obesity Eyes Medical HistoryDAWES, [name] ([id number]) [id number] Page 8 of 9 Negative for: Cataracts; Glaucoma; Optic Neuritis Ear/Nose/Mouth/Throat Medical History: Past Medical History Notes: hard of hearing Respiratory Medical History: Positive for: Sleep Apnea - CPAP Cardiovascular Medical History: Positive for: Congestive Heart Failure; Coronary Artery Disease; Hypertension; Myocardial Infarction Past Medical History Notes: hyperlipidemia, pulmonary hypertension Gastrointestinal Medical History: Past Medical History Notes: colon polyp Endocrine Medical History: Negative for: Type I Diabetes; Type II Diabetes Genitourinary Medical History: Negative for: End Stage Renal Disease Past Medical History Notes: kidney stones Integumentary (Skin) Medical History: Negative for: History of Burn Past  Medical History Notes: seborrheic dermatitis Musculoskeletal Medical History: Past Medical History Notes: hx compartment syndrome right lower leg Oncologic Medical History: Negative for: Received Chemotherapy; Received Radiation Past Medical History Notes: skin cancer removed Psychiatric Medical History: Negative for: Anorexia/bulimia; Confinement Anxiety Immunizations Pneumococcal Vaccine: Received Pneumococcal Vaccination: Yes Received Pneumococcal Vaccination On or After 60th Birthday: Yes Implantable Devices No devices added Family and Social History Cancer: Yes - Mother; Diabetes: Yes - Maternal Grandparents; Heart Disease: Yes - Father; Hereditary Spherocytosis: No; Hypertension: Yes - Mother,Father; Kidney Disease: No; Lung Disease: No; Seizures: No; Stroke: No; Thyroid Problems: No; Tuberculosis: No; Former smoker - quit 1997; Marital Status - Married; Alcohol Use: Never; Drug Use: No History; Caffeine Use: Rarely; Financial Concerns: No; Food, Clothing or Shelter Needs: No; Support System Lacking: No; Transportation Concerns: No [name], [name] ([id number]) [id number] Page 9 of 9 Electronic Signature(s) Signed: 01/06/2023 9:17:10 AM By: Duanne Guess MD FACS Entered By: Duanne Guess on 01/06/2023 06:04:59 -------------------------------------------------------------------------------- SuperBill Details Patient Name: Date of Service: [name], [name] 01/06/2023 Medical Record Number: [id number] Patient Account Number: [id number] Date of Birth/Sex: Treating RN: 07-Jan-1946 (77 y.o. M) Primary Care Provider: Antony Haste Other Clinician: Referring Provider: Treating Provider/Extender: Molli Knock in Treatment: 1 Diagnosis Coding ICD-10 Codes Code Description [phone] Non-pressure  chronic ulcer of other part of right lower leg with fat layer exposed T79.A0XS Compartment syndrome, unspecified,  sequela R60.0 Localized edema E66.01 Morbid (severe) obesity due to excess calories Facility Procedures : CPT4 Code: [id number] Description: 97597 - DEBRIDE WOUND 1ST 20 SQ CM OR < ICD-10 Diagnosis Description L97.812 Non-pressure chronic ulcer of other part of right lower leg with fat layer expos Modifier: ed Quantity: 1 Physician Procedures : CPT4 Code Description Modifier [id number] - WC PHYS LEVEL 3 - EST PT 25 ICD-10 Diagnosis Description L97.812 Non-pressure chronic ulcer of other part of right lower leg with fat layer exposed T79.A0XS Compartment syndrome, unspecified, sequela R60.0  Localized edema E66.01 Morbid (severe) obesity due to excess calories Quantity: 1 : [id number] 97597 - WC PHYS DEBR WO ANESTH 20 SQ CM ICD-10 Diagnosis Description L97.812 Non-pressure chronic ulcer of other part of right lower leg with fat layer exposed Quantity: 1 Electronic Signature(s) Signed: 01/06/2023 9:06:19 AM By: Duanne Guess MD FACS Entered By: Duanne Guess on 01/06/2023 06:06:19

## 2023-01-12 ENCOUNTER — Telehealth (HOSPITAL_COMMUNITY): Payer: Self-pay

## 2023-01-12 NOTE — Telephone Encounter (Signed)
Detailed instructions left on the patient's answering machine. Asked to call back with any questions. S.Aby Gessel CCT

## 2023-01-13 ENCOUNTER — Encounter (HOSPITAL_BASED_OUTPATIENT_CLINIC_OR_DEPARTMENT_OTHER): Payer: Medicare Other | Admitting: General Surgery

## 2023-01-13 DIAGNOSIS — I272 Pulmonary hypertension, unspecified: Secondary | ICD-10-CM | POA: Diagnosis not present

## 2023-01-13 DIAGNOSIS — I110 Hypertensive heart disease with heart failure: Secondary | ICD-10-CM | POA: Diagnosis not present

## 2023-01-13 DIAGNOSIS — I10 Essential (primary) hypertension: Secondary | ICD-10-CM | POA: Diagnosis not present

## 2023-01-13 DIAGNOSIS — I509 Heart failure, unspecified: Secondary | ICD-10-CM | POA: Diagnosis not present

## 2023-01-13 DIAGNOSIS — I2510 Atherosclerotic heart disease of native coronary artery without angina pectoris: Secondary | ICD-10-CM | POA: Diagnosis not present

## 2023-01-13 DIAGNOSIS — E785 Hyperlipidemia, unspecified: Secondary | ICD-10-CM | POA: Diagnosis not present

## 2023-01-13 DIAGNOSIS — I890 Lymphedema, not elsewhere classified: Secondary | ICD-10-CM | POA: Diagnosis not present

## 2023-01-13 DIAGNOSIS — Z833 Family history of diabetes mellitus: Secondary | ICD-10-CM | POA: Diagnosis not present

## 2023-01-13 DIAGNOSIS — G4730 Sleep apnea, unspecified: Secondary | ICD-10-CM | POA: Diagnosis not present

## 2023-01-13 DIAGNOSIS — L97812 Non-pressure chronic ulcer of other part of right lower leg with fat layer exposed: Secondary | ICD-10-CM | POA: Diagnosis not present

## 2023-01-13 NOTE — Progress Notes (Signed)
Date of Birth/Sex: Treating RN: 1945/05/21 (77 y.o. [name] Primary Care [name]: [name] Other Clinician: Referring [name]: Treating  [name]/Extender: [name] in Treatment: 2 Wound Status Wound Number: 1 Primary Venous Leg Ulcer Etiology: Wound Location: Right, Medial Lower Leg Wound Open Wounding Event: Gradually Appeared Status: Date Acquired: 01/24/2021 Comorbid Sleep Apnea, Congestive Heart Failure, Coronary Artery Disease, Weeks Of Treatment: 2 History: Hypertension, Myocardial Infarction Clustered Wound: No Photos Wound Measurements Length: (cm) Width: (cm) Depth: (cm) Area: (cm) Volume: (cm) 0 % Reduction in Area: 100% 0 % Reduction in Volume: 100% 0 Epithelialization: Large (67-100%) 0 Tunneling: No 0 Undermining: No Wound Description Classification: Full Thickness Without Exposed Support Exudate Amount: None Present Structures Foul Odor After Cleansing: No Slough/Fibrino No Wound Bed Granulation Amount: None Present (0%) Exposed Structure Necrotic Amount: None Present (0%) Fascia Exposed: No Fat Layer (Subcutaneous Tissue) Exposed: No Tendon Exposed: No Muscle Exposed: No Joint Exposed: No Bone Exposed: No Periwound Skin Texture Texture Color No Abnormalities Noted: No No Abnormalities Noted: No Scarring: Yes Ecchymosis: No Hemosiderin Staining: Yes Moisture No Abnormalities Noted: Yes Temperature / Pain Temperature: No Abnormality Tenderness on Palpation: Yes Electronic Signature(s) Signed: 01/13/2023 11:43:52 AM By: [name] Deed RN, BSN Entered By: [name] on 01/13/2023 09:50:15 [name] ([id number]) [id number] Page 8 of 8 -------------------------------------------------------------------------------- Vitals Details Patient Name: Date of Service: [name], [name] 01/13/2023 9:30 A M Medical Record Number: [id number] Patient Account Number: [id number] Date of Birth/Sex: Treating RN: Jan 30, 1946 (77 y.o. M) Primary Care [name]: [name] Other Clinician: Referring [name]: Treating  [name]/Extender: [name] in Treatment: 2 Vital Signs Time Taken: 09:27 Temperature (F): 98.3 Height (in): 68 Pulse (bpm): 73 Weight (lbs): 280 Respiratory Rate (breaths/min): 18 Body Mass Index (BMI): 42.6 Blood Pressure (mmHg): 156/81 Reference Range: 80 - 120 mg / dl Electronic Signature(s) Signed: 01/13/2023 11:43:52 AM By: [name] Deed RN, BSN Entered By: [name] on 01/13/2023 09:47:48  Weeks in Treatment: 2 Vital Signs Height(in): 68 Pulse(bpm): 73 Weight(lbs): 280 Blood Pressure(mmHg): 156/81 Body Mass Index(BMI): 42.6 Temperature(F): 98.3 Respiratory Rate(breaths/min): 18 [1:Photos:] [N/A:N/A] Right, Medial Lower Leg N/A N/A Wound Location: Gradually Appeared N/A N/A Wounding Event: Venous Leg Ulcer N/A N/A Primary Etiology: Sleep Apnea, Congestive Heart N/A N/A Comorbid History: Failure, Coronary Artery Disease, Hypertension, Myocardial Infarction 01/24/2021 N/A N/A Date Acquired: 2 N/A N/A Weeks of Treatment: Open N/A N/A Wound Status: No N/A N/A Wound Recurrence: 0x0x0 N/A N/A Measurements L x W x D (cm) 0 N/A N/A A (cm) : rea 0 N/A N/A Volume (cm) : 100.00% N/A N/A % Reduction in Area: 100.00% N/A N/A % Reduction in Volume: Full Thickness Without Exposed N/A N/A Classification: Support Structures None Present N/A N/A Exudate Amount: None Present (0%) N/A N/A Granulation Amount: None Present (0%) N/A N/A Necrotic Amount: Fascia: No N/A N/A Exposed Structures: Fat Layer  (Subcutaneous Tissue): No Tendon: No Muscle: No Joint: No Bone: No [name], [name] ([id number]) [id number] Page 5 of 8 Large (67-100%) N/A N/A Epithelialization: Scarring: Yes N/A N/A Periwound Skin Texture: No Abnormalities Noted N/A N/A Periwound Skin Moisture: Hemosiderin Staining: Yes N/A N/A Periwound Skin Color: Ecchymosis: No No Abnormality N/A N/A Temperature: Yes N/A N/A Tenderness on Palpation: Treatment Notes Electronic Signature(s) Signed: 01/13/2023 9:59:32 AM By: Duanne Guess MD FACS Entered By: Duanne [name] on 01/13/2023 09:59:32 -------------------------------------------------------------------------------- Multi-Disciplinary Care Plan Details Patient Name: Date of Service: [name], [name] 01/13/2023 9:30 A M Medical Record Number: [id number] Patient Account Number: [id number] Date of Birth/Sex: Treating RN: [birth year] (77 y.o. [name] Primary Care [name]: [name] Other Clinician: Referring [name]: Treating [name]/Extender: [name] in Treatment: 2 Multidisciplinary Care Plan reviewed with physician Active Inactive Electronic Signature(s) Signed: 01/13/2023 11:43:52 AM By: [name] Deed RN, BSN Entered By: [name] on 01/13/2023 10:02:34 -------------------------------------------------------------------------------- Pain Assessment Details Patient Name: Date of Service: [name], [name] 01/13/2023 9:30 A M Medical Record Number: [id number] Patient Account Number: [id number] Date of Birth/Sex: Treating RN: Nov 29, 1945 (78 y.o. M) Primary Care Junell Cullifer: [name] Other Clinician: Referring [name]: Treating [name]/Extender: [name] in Treatment: 2 Active Problems Location of Pain Severity and Description of Pain Patient Has Paino Yes Site Locations Pain LocationBLISS, [name] ([id number])  [id number] Page 6 of 8 Pain Location: Generalized Pain With Dressing Change: No Duration of the Pain. Constant / Intermittento Intermittent Rate the pain. Current Pain Level: 3 Worst Pain Level: 5 Least Pain Level: 0 Tolerable Pain Level: 1 Character of Pain Describe the Pain: Burning Pain Management and Medication Current Pain Management: Electronic Signature(s) Signed: 01/13/2023 11:43:52 AM By: [name] Deed RN, BSN Entered By: [name] on 01/13/2023 09:48:15 -------------------------------------------------------------------------------- Patient/Caregiver Education Details Patient Name: Date of Service: [name], [name] 9/13/2024andnbsp9:30 A M Medical Record Number: [id number] Patient Account Number: [id number] Date of Birth/Gender: Treating RN: December 25, 1945 (77 y.o. [name] Primary Care Physician: [name] Other Clinician: Referring Physician: Treating Physician/Extender: [name] in Treatment: 2 Education Assessment Education Provided To: Patient Education Topics Provided Venous: Methods: Explain/Verbal Responses: Reinforcements needed, State content correctly Wound/Skin Impairment: Methods: Explain/Verbal Responses: Reinforcements needed, State content correctly Electronic Signature(s) Signed: 01/13/2023 11:43:52 AM By: [name] Deed RN, BSN Entered By: [name] on 01/13/2023 10:03:01 [name] ([id number]) [id number] Page 7 of 8 -------------------------------------------------------------------------------- Wound Assessment Details Patient Name: Date of Service: [name], [name] 01/13/2023 9:30 A M Medical Record Number: [id number] Patient Account Number: [id number]  [name], [name] ([id number]) [id number] Page 1 of 8 Visit Report for 01/13/2023 Arrival Information Details Patient Name: Date of Service: [name], [name] 01/13/2023 9:30 A M Medical Record Number: [id number] Patient Account Number: [id number] Date of Birth/Sex: Treating RN: 22-Jan-1946 (77 y.o. M) Primary Care Loring Liskey: [name] Other Clinician: Referring Roselene Gray: Treating Roselyne Stalnaker/Extender: [name] in Treatment: 2 Visit Information History Since Last Visit All ordered tests and consults were completed: No Patient Arrived: Ambulatory Added or deleted any medications: No Arrival Time: 09:26 Any new allergies or adverse reactions: No Accompanied By: self Had a fall or experienced change in No Transfer Assistance: None activities of daily living that may affect Patient Identification Verified: Yes risk of falls: Secondary Verification Process Completed: Yes Signs or symptoms of abuse/neglect since last visito No Patient Requires Transmission-Based Precautions: No Hospitalized since last visit: No Patient Has Alerts: Yes Implantable device outside of the clinic excluding No Patient Alerts: R ABI=1.09, TBI=.88 cellular tissue based products placed in the center L ABI=1.07, TBI= .71 since last visit: Has Dressing in Place as Prescribed: Yes Has Compression in Place as Prescribed: Yes Pain Present Now: No Electronic Signature(s) Signed: 01/13/2023 11:43:52 AM By: [name] Deed RN, BSN Entered By: [name] on 01/13/2023 09:47:39 -------------------------------------------------------------------------------- Clinic Level of Care Assessment Details Patient Name: Date of Service: [name], [name] 01/13/2023 9:30 A M Medical Record Number: [id number] Patient Account Number: [id number] Date of Birth/Sex: Treating RN: [birth year] (77 y.o. [name] Primary Care Larkin Morelos: [name] Other  Clinician: Referring Ondra Deboard: Treating Keiana Tavella/Extender: [name] in Treatment: 2 Clinic Level of Care Assessment Items TOOL 4 Quantity Score []  - 0 Use when only an EandM is performed on FOLLOW-UP visit ASSESSMENTS - Nursing Assessment / Reassessment X- 1 10 Reassessment of Co-morbidities (includes updates in patient status) X- 1 5 Reassessment of Adherence to Treatment Plan ASSESSMENTS - Wound and Skin A ssessment / Reassessment X - Simple Wound Assessment / Reassessment - one wound 1 5 []  - 0 Complex Wound Assessment / Reassessment - multiple wounds []  - 0 Dermatologic / Skin Assessment (not related to wound area) ASSESSMENTS - Focused Assessment X- 1 5 Circumferential Edema Measurements - multi extremities Turbin, Blayton ([id number]) [id number] Page 2 of 8 []  - 0 Nutritional Assessment / Counseling / Intervention X- 1 5 Lower Extremity Assessment (monofilament, tuning fork, pulses) []  - 0 Peripheral Arterial Disease Assessment (using hand held doppler) ASSESSMENTS - Ostomy and/or Continence Assessment and Care []  - 0 Incontinence Assessment and Management []  - 0 Ostomy Care Assessment and Management (repouching, etc.) PROCESS - Coordination of Care X - Simple Patient / Family Education for ongoing care 1 15 []  - 0 Complex (extensive) Patient / Family Education for ongoing care X- 1 10 Staff obtains Chiropractor, Records, T Results / Process Orders est []  - 0 Staff telephones HHA, Nursing Homes / Clarify orders / etc []  - 0 Routine Transfer to another Facility (non-emergent condition) []  - 0 Routine Hospital Admission (non-emergent condition) []  - 0 New Admissions / Manufacturing engineer / Ordering NPWT Apligraf, etc. , []  - 0 Emergency Hospital Admission (emergent condition) X- 1 10 Simple Discharge Coordination []  - 0 Complex (extensive) Discharge Coordination PROCESS - Special Needs []  -  0 Pediatric / Minor Patient Management []  - 0 Isolation Patient Management []  - 0 Hearing / Language / Visual special needs []  - 0 Assessment of Community assistance (transportation, D/C planning, etc.) []  - 0 Additional assistance / Altered  Weeks in Treatment: 2 Vital Signs Height(in): 68 Pulse(bpm): 73 Weight(lbs): 280 Blood Pressure(mmHg): 156/81 Body Mass Index(BMI): 42.6 Temperature(F): 98.3 Respiratory Rate(breaths/min): 18 [1:Photos:] [N/A:N/A] Right, Medial Lower Leg N/A N/A Wound Location: Gradually Appeared N/A N/A Wounding Event: Venous Leg Ulcer N/A N/A Primary Etiology: Sleep Apnea, Congestive Heart N/A N/A Comorbid History: Failure, Coronary Artery Disease, Hypertension, Myocardial Infarction 01/24/2021 N/A N/A Date Acquired: 2 N/A N/A Weeks of Treatment: Open N/A N/A Wound Status: No N/A N/A Wound Recurrence: 0x0x0 N/A N/A Measurements L x W x D (cm) 0 N/A N/A A (cm) : rea 0 N/A N/A Volume (cm) : 100.00% N/A N/A % Reduction in Area: 100.00% N/A N/A % Reduction in Volume: Full Thickness Without Exposed N/A N/A Classification: Support Structures None Present N/A N/A Exudate Amount: None Present (0%) N/A N/A Granulation Amount: None Present (0%) N/A N/A Necrotic Amount: Fascia: No N/A N/A Exposed Structures: Fat Layer  (Subcutaneous Tissue): No Tendon: No Muscle: No Joint: No Bone: No [name], [name] ([id number]) [id number] Page 5 of 8 Large (67-100%) N/A N/A Epithelialization: Scarring: Yes N/A N/A Periwound Skin Texture: No Abnormalities Noted N/A N/A Periwound Skin Moisture: Hemosiderin Staining: Yes N/A N/A Periwound Skin Color: Ecchymosis: No No Abnormality N/A N/A Temperature: Yes N/A N/A Tenderness on Palpation: Treatment Notes Electronic Signature(s) Signed: 01/13/2023 9:59:32 AM By: Duanne Guess MD FACS Entered By: Duanne [name] on 01/13/2023 09:59:32 -------------------------------------------------------------------------------- Multi-Disciplinary Care Plan Details Patient Name: Date of Service: [name], [name] 01/13/2023 9:30 A M Medical Record Number: [id number] Patient Account Number: [id number] Date of Birth/Sex: Treating RN: [birth year] (77 y.o. [name] Primary Care [name]: [name] Other Clinician: Referring [name]: Treating [name]/Extender: [name] in Treatment: 2 Multidisciplinary Care Plan reviewed with physician Active Inactive Electronic Signature(s) Signed: 01/13/2023 11:43:52 AM By: [name] Deed RN, BSN Entered By: [name] on 01/13/2023 10:02:34 -------------------------------------------------------------------------------- Pain Assessment Details Patient Name: Date of Service: [name], [name] 01/13/2023 9:30 A M Medical Record Number: [id number] Patient Account Number: [id number] Date of Birth/Sex: Treating RN: Nov 29, 1945 (78 y.o. M) Primary Care Junell Cullifer: [name] Other Clinician: Referring [name]: Treating [name]/Extender: [name] in Treatment: 2 Active Problems Location of Pain Severity and Description of Pain Patient Has Paino Yes Site Locations Pain LocationBLISS, [name] ([id number])  [id number] Page 6 of 8 Pain Location: Generalized Pain With Dressing Change: No Duration of the Pain. Constant / Intermittento Intermittent Rate the pain. Current Pain Level: 3 Worst Pain Level: 5 Least Pain Level: 0 Tolerable Pain Level: 1 Character of Pain Describe the Pain: Burning Pain Management and Medication Current Pain Management: Electronic Signature(s) Signed: 01/13/2023 11:43:52 AM By: [name] Deed RN, BSN Entered By: [name] on 01/13/2023 09:48:15 -------------------------------------------------------------------------------- Patient/Caregiver Education Details Patient Name: Date of Service: [name], [name] 9/13/2024andnbsp9:30 A M Medical Record Number: [id number] Patient Account Number: [id number] Date of Birth/Gender: Treating RN: December 25, 1945 (77 y.o. [name] Primary Care Physician: [name] Other Clinician: Referring Physician: Treating Physician/Extender: [name] in Treatment: 2 Education Assessment Education Provided To: Patient Education Topics Provided Venous: Methods: Explain/Verbal Responses: Reinforcements needed, State content correctly Wound/Skin Impairment: Methods: Explain/Verbal Responses: Reinforcements needed, State content correctly Electronic Signature(s) Signed: 01/13/2023 11:43:52 AM By: [name] Deed RN, BSN Entered By: [name] on 01/13/2023 10:03:01 [name] ([id number]) [id number] Page 7 of 8 -------------------------------------------------------------------------------- Wound Assessment Details Patient Name: Date of Service: [name], [name] 01/13/2023 9:30 A M Medical Record Number: [id number] Patient Account Number: [id number]

## 2023-01-13 NOTE — Progress Notes (Signed)
legs daily. - both legs daily using moisturizing lotion such as eucerin, goldbond, cetaphil etc. Compression stocking or Garment 20-30 mm/Hg pressure to: - both legs daily 01/13/2023: His wounds are healed. He was advised to make sure that he moisturizes his skin extremely well on a daily basis and to wear bilateral compression stockings daily. Will discharge him from the wound care center. He may follow-up as needed. Electronic Signature(s) Signed: 01/13/2023 10:02:19 AM By: Duanne Guess MD FACS Entered By: Duanne Guess on 01/13/2023 10:02:19 -------------------------------------------------------------------------------- HxROS Details Patient Name: Date of Service: [name], [name] 01/13/2023 9:30 A M Medical Record Number: [id number] Patient Account Number: [id number] Date of Birth/Sex: Treating RN: 1945/11/26 (77 y.o. M) Primary Care Provider: Antony Haste Other Clinician: Referring Provider: Treating Provider/Extender: Molli Knock in Treatment: 2 Information Obtained From Patient Chart Constitutional Symptoms (General  Health) Medical History: Past Medical History Notes: morbid obesity Eyes Medical History: Negative for: Cataracts; Glaucoma; Optic Neuritis Ear/Nose/Mouth/Throat Medical History: Past Medical History Notes: hard of hearing Respiratory Medical History: Positive for: Sleep Apnea - CPAP Cardiovascular Medical History: Positive for: Congestive Heart Failure; Coronary Artery Disease; Hypertension; Myocardial Infarction Past Medical History Notes: hyperlipidemia, pulmonary hypertension Gastrointestinal Medical History: Past Medical History Notes: colon polyp Endocrine Medical History: Negative for: Type I Diabetes; Type II Diabetes Ellen, Rahul ([id number]) [id number] Page 7 of 8 Genitourinary Medical History: Negative for: End Stage Renal Disease Past Medical History Notes: kidney stones Integumentary (Skin) Medical History: Negative for: History of Burn Past Medical History Notes: seborrheic dermatitis Musculoskeletal Medical History: Past Medical History Notes: hx compartment syndrome right lower leg Oncologic Medical History: Negative for: Received Chemotherapy; Received Radiation Past Medical History Notes: skin cancer removed Psychiatric Medical History: Negative for: Anorexia/bulimia; Confinement Anxiety Immunizations Pneumococcal Vaccine: Received Pneumococcal Vaccination: Yes Received Pneumococcal Vaccination On or After 60th Birthday: Yes Implantable Devices No devices added Family and Social History Cancer: Yes - Mother; Diabetes: Yes - Maternal Grandparents; Heart Disease: Yes - Father; Hereditary Spherocytosis: No; Hypertension: Yes - Mother,Father; Kidney Disease: No; Lung Disease: No; Seizures: No; Stroke: No; Thyroid Problems: No; Tuberculosis: No; Former smoker - quit 1997; Marital Status - Married; Alcohol Use: Never; Drug Use: No History; Caffeine Use: Rarely; Financial Concerns: No; Food, Clothing or Shelter  Needs: No; Support System Lacking: No; Transportation Concerns: No Electronic Signature(s) Signed: 01/13/2023 10:39:27 AM By: Duanne Guess MD FACS Entered By: Duanne Guess on 01/13/2023 10:01:02 -------------------------------------------------------------------------------- SuperBill Details Patient Name: Date of Service: [name], [name] 01/13/2023 Medical Record Number: [id number] Patient Account Number: [id number] Date of Birth/Sex: Treating RN: Feb 04, 1946 (77 y.o. M) Primary Care Provider: Antony Haste Other Clinician: Referring Provider: Treating Provider/Extender: Molli Knock in Treatment: 2 Diagnosis Coding ICD-10 Codes Code Description [phone] Non-pressure chronic ulcer of other part of right lower leg with fat layer exposed T79.A0XS Compartment syndrome, unspecified, sequela R60.0 Localized edema E66.01 Morbid (severe) obesity due to excess calories Urquilla, Marilu Favre ([id number]) [id number] Page 8 of 8 Facility Procedures : CPT4 Code: 20254270 Description: 99213 - WOUND CARE VISIT-LEV 3 EST PT Modifier: Quantity: 1 Physician Procedures : CPT4 Code Description Modifier [id number] 99213 - WC PHYS LEVEL 3 - EST PT ICD-10 Diagnosis Description L97.812 Non-pressure chronic ulcer of other part of right lower leg with fat layer exposed T79.A0XS Compartment syndrome, unspecified, sequela R60.0  Localized edema E66.01 Morbid (severe) obesity due to excess calories Quantity: 1 Electronic Signature(s) Signed: 01/13/2023 10:39:27 AM By: Duanne Guess MD FACS Signed: 01/13/2023 11:43:52 AM By: Zenaida Deed  legs daily. - both legs daily using moisturizing lotion such as eucerin, goldbond, cetaphil etc. Compression stocking or Garment 20-30 mm/Hg pressure to: - both legs daily 01/13/2023: His wounds are healed. He was advised to make sure that he moisturizes his skin extremely well on a daily basis and to wear bilateral compression stockings daily. Will discharge him from the wound care center. He may follow-up as needed. Electronic Signature(s) Signed: 01/13/2023 10:02:19 AM By: Duanne Guess MD FACS Entered By: Duanne Guess on 01/13/2023 10:02:19 -------------------------------------------------------------------------------- HxROS Details Patient Name: Date of Service: [name], [name] 01/13/2023 9:30 A M Medical Record Number: [id number] Patient Account Number: [id number] Date of Birth/Sex: Treating RN: 1945/11/26 (77 y.o. M) Primary Care Provider: Antony Haste Other Clinician: Referring Provider: Treating Provider/Extender: Molli Knock in Treatment: 2 Information Obtained From Patient Chart Constitutional Symptoms (General  Health) Medical History: Past Medical History Notes: morbid obesity Eyes Medical History: Negative for: Cataracts; Glaucoma; Optic Neuritis Ear/Nose/Mouth/Throat Medical History: Past Medical History Notes: hard of hearing Respiratory Medical History: Positive for: Sleep Apnea - CPAP Cardiovascular Medical History: Positive for: Congestive Heart Failure; Coronary Artery Disease; Hypertension; Myocardial Infarction Past Medical History Notes: hyperlipidemia, pulmonary hypertension Gastrointestinal Medical History: Past Medical History Notes: colon polyp Endocrine Medical History: Negative for: Type I Diabetes; Type II Diabetes Ellen, Rahul ([id number]) [id number] Page 7 of 8 Genitourinary Medical History: Negative for: End Stage Renal Disease Past Medical History Notes: kidney stones Integumentary (Skin) Medical History: Negative for: History of Burn Past Medical History Notes: seborrheic dermatitis Musculoskeletal Medical History: Past Medical History Notes: hx compartment syndrome right lower leg Oncologic Medical History: Negative for: Received Chemotherapy; Received Radiation Past Medical History Notes: skin cancer removed Psychiatric Medical History: Negative for: Anorexia/bulimia; Confinement Anxiety Immunizations Pneumococcal Vaccine: Received Pneumococcal Vaccination: Yes Received Pneumococcal Vaccination On or After 60th Birthday: Yes Implantable Devices No devices added Family and Social History Cancer: Yes - Mother; Diabetes: Yes - Maternal Grandparents; Heart Disease: Yes - Father; Hereditary Spherocytosis: No; Hypertension: Yes - Mother,Father; Kidney Disease: No; Lung Disease: No; Seizures: No; Stroke: No; Thyroid Problems: No; Tuberculosis: No; Former smoker - quit 1997; Marital Status - Married; Alcohol Use: Never; Drug Use: No History; Caffeine Use: Rarely; Financial Concerns: No; Food, Clothing or Shelter  Needs: No; Support System Lacking: No; Transportation Concerns: No Electronic Signature(s) Signed: 01/13/2023 10:39:27 AM By: Duanne Guess MD FACS Entered By: Duanne Guess on 01/13/2023 10:01:02 -------------------------------------------------------------------------------- SuperBill Details Patient Name: Date of Service: [name], [name] 01/13/2023 Medical Record Number: [id number] Patient Account Number: [id number] Date of Birth/Sex: Treating RN: Feb 04, 1946 (77 y.o. M) Primary Care Provider: Antony Haste Other Clinician: Referring Provider: Treating Provider/Extender: Molli Knock in Treatment: 2 Diagnosis Coding ICD-10 Codes Code Description [phone] Non-pressure chronic ulcer of other part of right lower leg with fat layer exposed T79.A0XS Compartment syndrome, unspecified, sequela R60.0 Localized edema E66.01 Morbid (severe) obesity due to excess calories Urquilla, Marilu Favre ([id number]) [id number] Page 8 of 8 Facility Procedures : CPT4 Code: 20254270 Description: 99213 - WOUND CARE VISIT-LEV 3 EST PT Modifier: Quantity: 1 Physician Procedures : CPT4 Code Description Modifier [id number] 99213 - WC PHYS LEVEL 3 - EST PT ICD-10 Diagnosis Description L97.812 Non-pressure chronic ulcer of other part of right lower leg with fat layer exposed T79.A0XS Compartment syndrome, unspecified, sequela R60.0  Localized edema E66.01 Morbid (severe) obesity due to excess calories Quantity: 1 Electronic Signature(s) Signed: 01/13/2023 10:39:27 AM By: Duanne Guess MD FACS Signed: 01/13/2023 11:43:52 AM By: Zenaida Deed  Patient Name: Date of Service: [name], [name] 01/13/2023 9:30 A M Medical Record Number: [id number] Patient Account Number: [id number] Date of Birth/Sex: Treating RN: February 11, 1946 (77 y.o. Damaris Schooner Primary Care Provider: Antony Haste Other Clinician: Referring Provider: Treating Provider/Extender: Molli Knock in Treatment: 2 The following information was scribed by: Zenaida Deed The information was scribed for: Duanne Guess Verbal / Phone Orders: No Diagnosis [address] YAVIEL, MALLEY ([id number]) [id number] Page 3 of 8 ICD-10 Coding Code Description [phone] Non-pressure chronic ulcer of other part of right lower leg with fat layer exposed T79.A0XS  Compartment syndrome, unspecified, sequela R60.0 Localized edema E66.01 Morbid (severe) obesity due to excess calories Discharge From [facility] Services Discharge from Wound Care Center Edema Control - Lymphedema / SCD / Other Bilateral Lower Extremities Elevate legs to the level of the heart or above for 30 minutes daily and/or when sitting for 3-4 times a day throughout the day. Avoid standing for long periods of time. Exercise regularly Moisturize legs daily. - both legs daily using moisturizing lotion such as eucerin, goldbond, cetaphil etc. Compression stocking or Garment 20-30 mm/Hg pressure to: - both legs daily Electronic Signature(s) Signed: 01/13/2023 10:01:43 AM By: Duanne Guess MD FACS Entered By: Duanne Guess on 01/13/2023 10:01:43 -------------------------------------------------------------------------------- Problem List Details Patient Name: Date of Service: [name], [name] 01/13/2023 9:30 A M Medical Record Number: [id number] Patient Account Number: [id number] Date of Birth/Sex: Treating RN: 1946-01-21 (77 y.o. M) Primary Care Provider: Antony Haste Other Clinician: Referring Provider: Treating Provider/Extender: Molli Knock in Treatment: 2 Active Problems ICD-10 Encounter Code Description Active Date MDM Diagnosis [phone] Non-pressure chronic ulcer of other part of right lower leg with fat layer 12/29/2022 No Yes exposed T79.A0XS Compartment syndrome, unspecified, sequela 12/29/2022 No Yes R60.0 Localized edema 12/29/2022 No Yes E66.01 Morbid (severe) obesity due to excess calories 12/29/2022 No Yes Inactive Problems Resolved Problems Electronic Signature(s) Signed: 01/13/2023 9:58:55 AM By: Duanne Guess MD FACS Entered By: Duanne Guess on 01/13/2023 09:58:54 Hueber, Marilu Favre ([id number]) [id number] Page 4 of  8 -------------------------------------------------------------------------------- Progress Note Details Patient Name: Date of Service: [name], [name] 01/13/2023 9:30 A M Medical Record Number: [id number] Patient Account Number: [id number] Date of Birth/Sex: Treating RN: [birth year] (77 y.o. M) Primary Care Provider: Antony Haste Other Clinician: Referring Provider: Treating Provider/Extender: Molli Knock in Treatment: 2 Subjective Chief Complaint Information obtained from Patient Patient seen for complaints of Non-Healing Wound. History of Present Illness (HPI) ADMISSION 12/29/2022 Non-Invasive Vascular Imaging: (2022) +-------+-----------+-----------+------------+------------+ ABI/TBIT oday's ABIT oday's TBIPrevious ABIPrevious TBI +-------+-----------+-----------+------------+------------+ Right 1.09 0.88    +-------+-----------+-----------+------------+------------+ Left 1.07 0.71    +-------+-----------+-----------+------------+------------+ Venous Reflux Times: (2024) +--------------+---------+------+-----------+------------+--------+ RIGHT Reflux NoRefluxReflux TimeDiameter cmsComments    Yes     +--------------+---------+------+-----------+------------+--------+ CFV no      +--------------+---------+------+-----------+------------+--------+ FV mid no      +--------------+---------+------+-----------+------------+--------+ Popliteal no      +--------------+---------+------+-----------+------------+--------+ GSV at SFJ   yes  >500 ms  0.67   +--------------+---------+------+-----------+------------+--------+ GSV prox thighno    0.57   +--------------+---------+------+-----------+------------+--------+ GSV mid thigh no    0.46   +--------------+---------+------+-----------+------------+--------+ GSV dist thighno    0.42    +--------------+---------+------+-----------+------------+--------+ GSV at knee   yes  >500 ms  0.38   +--------------+---------+------+-----------+------------+--------+ GSV prox calf no    0.23   +--------------+---------+------+-----------+------------+--------+ SSV Pop Fossa no    0.29   +--------------+---------+------+-----------+------------+--------+ SSV prox calf no    0.19   +--------------+---------+------+-----------+------------+--------+ This is a 77 year old non diabetic who underwent  Patient Name: Date of Service: [name], [name] 01/13/2023 9:30 A M Medical Record Number: [id number] Patient Account Number: [id number] Date of Birth/Sex: Treating RN: February 11, 1946 (77 y.o. Damaris Schooner Primary Care Provider: Antony Haste Other Clinician: Referring Provider: Treating Provider/Extender: Molli Knock in Treatment: 2 The following information was scribed by: Zenaida Deed The information was scribed for: Duanne Guess Verbal / Phone Orders: No Diagnosis [address] YAVIEL, MALLEY ([id number]) [id number] Page 3 of 8 ICD-10 Coding Code Description [phone] Non-pressure chronic ulcer of other part of right lower leg with fat layer exposed T79.A0XS  Compartment syndrome, unspecified, sequela R60.0 Localized edema E66.01 Morbid (severe) obesity due to excess calories Discharge From [facility] Services Discharge from Wound Care Center Edema Control - Lymphedema / SCD / Other Bilateral Lower Extremities Elevate legs to the level of the heart or above for 30 minutes daily and/or when sitting for 3-4 times a day throughout the day. Avoid standing for long periods of time. Exercise regularly Moisturize legs daily. - both legs daily using moisturizing lotion such as eucerin, goldbond, cetaphil etc. Compression stocking or Garment 20-30 mm/Hg pressure to: - both legs daily Electronic Signature(s) Signed: 01/13/2023 10:01:43 AM By: Duanne Guess MD FACS Entered By: Duanne Guess on 01/13/2023 10:01:43 -------------------------------------------------------------------------------- Problem List Details Patient Name: Date of Service: [name], [name] 01/13/2023 9:30 A M Medical Record Number: [id number] Patient Account Number: [id number] Date of Birth/Sex: Treating RN: 1946-01-21 (77 y.o. M) Primary Care Provider: Antony Haste Other Clinician: Referring Provider: Treating Provider/Extender: Molli Knock in Treatment: 2 Active Problems ICD-10 Encounter Code Description Active Date MDM Diagnosis [phone] Non-pressure chronic ulcer of other part of right lower leg with fat layer 12/29/2022 No Yes exposed T79.A0XS Compartment syndrome, unspecified, sequela 12/29/2022 No Yes R60.0 Localized edema 12/29/2022 No Yes E66.01 Morbid (severe) obesity due to excess calories 12/29/2022 No Yes Inactive Problems Resolved Problems Electronic Signature(s) Signed: 01/13/2023 9:58:55 AM By: Duanne Guess MD FACS Entered By: Duanne Guess on 01/13/2023 09:58:54 Hueber, Marilu Favre ([id number]) [id number] Page 4 of  8 -------------------------------------------------------------------------------- Progress Note Details Patient Name: Date of Service: [name], [name] 01/13/2023 9:30 A M Medical Record Number: [id number] Patient Account Number: [id number] Date of Birth/Sex: Treating RN: [birth year] (77 y.o. M) Primary Care Provider: Antony Haste Other Clinician: Referring Provider: Treating Provider/Extender: Molli Knock in Treatment: 2 Subjective Chief Complaint Information obtained from Patient Patient seen for complaints of Non-Healing Wound. History of Present Illness (HPI) ADMISSION 12/29/2022 Non-Invasive Vascular Imaging: (2022) +-------+-----------+-----------+------------+------------+ ABI/TBIT oday's ABIT oday's TBIPrevious ABIPrevious TBI +-------+-----------+-----------+------------+------------+ Right 1.09 0.88    +-------+-----------+-----------+------------+------------+ Left 1.07 0.71    +-------+-----------+-----------+------------+------------+ Venous Reflux Times: (2024) +--------------+---------+------+-----------+------------+--------+ RIGHT Reflux NoRefluxReflux TimeDiameter cmsComments    Yes     +--------------+---------+------+-----------+------------+--------+ CFV no      +--------------+---------+------+-----------+------------+--------+ FV mid no      +--------------+---------+------+-----------+------------+--------+ Popliteal no      +--------------+---------+------+-----------+------------+--------+ GSV at SFJ   yes  >500 ms  0.67   +--------------+---------+------+-----------+------------+--------+ GSV prox thighno    0.57   +--------------+---------+------+-----------+------------+--------+ GSV mid thigh no    0.46   +--------------+---------+------+-----------+------------+--------+ GSV dist thighno    0.42    +--------------+---------+------+-----------+------------+--------+ GSV at knee   yes  >500 ms  0.38   +--------------+---------+------+-----------+------------+--------+ GSV prox calf no    0.23   +--------------+---------+------+-----------+------------+--------+ SSV Pop Fossa no    0.29   +--------------+---------+------+-----------+------------+--------+ SSV prox calf no    0.19   +--------------+---------+------+-----------+------------+--------+ This is a 77 year old non diabetic who underwent  legs daily. - both legs daily using moisturizing lotion such as eucerin, goldbond, cetaphil etc. Compression stocking or Garment 20-30 mm/Hg pressure to: - both legs daily 01/13/2023: His wounds are healed. He was advised to make sure that he moisturizes his skin extremely well on a daily basis and to wear bilateral compression stockings daily. Will discharge him from the wound care center. He may follow-up as needed. Electronic Signature(s) Signed: 01/13/2023 10:02:19 AM By: Duanne Guess MD FACS Entered By: Duanne Guess on 01/13/2023 10:02:19 -------------------------------------------------------------------------------- HxROS Details Patient Name: Date of Service: [name], [name] 01/13/2023 9:30 A M Medical Record Number: [id number] Patient Account Number: [id number] Date of Birth/Sex: Treating RN: 1945/11/26 (77 y.o. M) Primary Care Provider: Antony Haste Other Clinician: Referring Provider: Treating Provider/Extender: Molli Knock in Treatment: 2 Information Obtained From Patient Chart Constitutional Symptoms (General  Health) Medical History: Past Medical History Notes: morbid obesity Eyes Medical History: Negative for: Cataracts; Glaucoma; Optic Neuritis Ear/Nose/Mouth/Throat Medical History: Past Medical History Notes: hard of hearing Respiratory Medical History: Positive for: Sleep Apnea - CPAP Cardiovascular Medical History: Positive for: Congestive Heart Failure; Coronary Artery Disease; Hypertension; Myocardial Infarction Past Medical History Notes: hyperlipidemia, pulmonary hypertension Gastrointestinal Medical History: Past Medical History Notes: colon polyp Endocrine Medical History: Negative for: Type I Diabetes; Type II Diabetes Ellen, Rahul ([id number]) [id number] Page 7 of 8 Genitourinary Medical History: Negative for: End Stage Renal Disease Past Medical History Notes: kidney stones Integumentary (Skin) Medical History: Negative for: History of Burn Past Medical History Notes: seborrheic dermatitis Musculoskeletal Medical History: Past Medical History Notes: hx compartment syndrome right lower leg Oncologic Medical History: Negative for: Received Chemotherapy; Received Radiation Past Medical History Notes: skin cancer removed Psychiatric Medical History: Negative for: Anorexia/bulimia; Confinement Anxiety Immunizations Pneumococcal Vaccine: Received Pneumococcal Vaccination: Yes Received Pneumococcal Vaccination On or After 60th Birthday: Yes Implantable Devices No devices added Family and Social History Cancer: Yes - Mother; Diabetes: Yes - Maternal Grandparents; Heart Disease: Yes - Father; Hereditary Spherocytosis: No; Hypertension: Yes - Mother,Father; Kidney Disease: No; Lung Disease: No; Seizures: No; Stroke: No; Thyroid Problems: No; Tuberculosis: No; Former smoker - quit 1997; Marital Status - Married; Alcohol Use: Never; Drug Use: No History; Caffeine Use: Rarely; Financial Concerns: No; Food, Clothing or Shelter  Needs: No; Support System Lacking: No; Transportation Concerns: No Electronic Signature(s) Signed: 01/13/2023 10:39:27 AM By: Duanne Guess MD FACS Entered By: Duanne Guess on 01/13/2023 10:01:02 -------------------------------------------------------------------------------- SuperBill Details Patient Name: Date of Service: [name], [name] 01/13/2023 Medical Record Number: [id number] Patient Account Number: [id number] Date of Birth/Sex: Treating RN: Feb 04, 1946 (77 y.o. M) Primary Care Provider: Antony Haste Other Clinician: Referring Provider: Treating Provider/Extender: Molli Knock in Treatment: 2 Diagnosis Coding ICD-10 Codes Code Description [phone] Non-pressure chronic ulcer of other part of right lower leg with fat layer exposed T79.A0XS Compartment syndrome, unspecified, sequela R60.0 Localized edema E66.01 Morbid (severe) obesity due to excess calories Urquilla, Marilu Favre ([id number]) [id number] Page 8 of 8 Facility Procedures : CPT4 Code: 20254270 Description: 99213 - WOUND CARE VISIT-LEV 3 EST PT Modifier: Quantity: 1 Physician Procedures : CPT4 Code Description Modifier [id number] 99213 - WC PHYS LEVEL 3 - EST PT ICD-10 Diagnosis Description L97.812 Non-pressure chronic ulcer of other part of right lower leg with fat layer exposed T79.A0XS Compartment syndrome, unspecified, sequela R60.0  Localized edema E66.01 Morbid (severe) obesity due to excess calories Quantity: 1 Electronic Signature(s) Signed: 01/13/2023 10:39:27 AM By: Duanne Guess MD FACS Signed: 01/13/2023 11:43:52 AM By: Zenaida Deed

## 2023-01-17 ENCOUNTER — Ambulatory Visit (HOSPITAL_BASED_OUTPATIENT_CLINIC_OR_DEPARTMENT_OTHER): Payer: Medicare Other

## 2023-01-17 ENCOUNTER — Ambulatory Visit (HOSPITAL_COMMUNITY): Payer: Medicare Other | Attending: Cardiology

## 2023-01-17 DIAGNOSIS — Z6841 Body Mass Index (BMI) 40.0 and over, adult: Secondary | ICD-10-CM | POA: Insufficient documentation

## 2023-01-17 DIAGNOSIS — E785 Hyperlipidemia, unspecified: Secondary | ICD-10-CM | POA: Diagnosis not present

## 2023-01-17 DIAGNOSIS — I25118 Atherosclerotic heart disease of native coronary artery with other forms of angina pectoris: Secondary | ICD-10-CM

## 2023-01-17 DIAGNOSIS — I10 Essential (primary) hypertension: Secondary | ICD-10-CM | POA: Insufficient documentation

## 2023-01-17 DIAGNOSIS — R0609 Other forms of dyspnea: Secondary | ICD-10-CM

## 2023-01-17 LAB — ECHOCARDIOGRAM COMPLETE
Area-P 1/2: 3.15 cm2
S' Lateral: 2.4 cm

## 2023-01-17 MED ORDER — REGADENOSON 0.4 MG/5ML IV SOLN
0.4000 mg | Freq: Once | INTRAVENOUS | Status: AC
  Administered 2023-01-17: 0.4 mg via INTRAVENOUS

## 2023-01-17 MED ORDER — TECHNETIUM TC 99M TETROFOSMIN IV KIT
31.8000 | PACK | Freq: Once | INTRAVENOUS | Status: AC | PRN
Start: 2023-01-17 — End: 2023-01-17
  Administered 2023-01-17: 31.8 via INTRAVENOUS

## 2023-01-18 ENCOUNTER — Ambulatory Visit (HOSPITAL_COMMUNITY): Payer: Medicare Other

## 2023-01-18 LAB — MYOCARDIAL PERFUSION IMAGING
Peak HR: 89 {beats}/min
Rest HR: 75 {beats}/min
Stress Nuclear Isotope Dose: 31.8 mCi

## 2023-01-18 MED ORDER — TECHNETIUM TC 99M TETROFOSMIN IV KIT
32.8000 | PACK | Freq: Once | INTRAVENOUS | Status: AC | PRN
Start: 2023-01-18 — End: 2023-01-18
  Administered 2023-01-18: 32.8 via INTRAVENOUS

## 2023-01-27 ENCOUNTER — Ambulatory Visit: Payer: Medicare Other | Admitting: Vascular Surgery

## 2023-02-16 ENCOUNTER — Encounter: Payer: Self-pay | Admitting: Vascular Surgery

## 2023-02-16 ENCOUNTER — Ambulatory Visit: Payer: Medicare Other | Admitting: Vascular Surgery

## 2023-02-16 VITALS — BP 164/78 | HR 69 | Temp 99.3°F | Ht 68.0 in | Wt 279.4 lb

## 2023-02-16 DIAGNOSIS — R600 Localized edema: Secondary | ICD-10-CM | POA: Diagnosis not present

## 2023-02-16 DIAGNOSIS — Z9889 Other specified postprocedural states: Secondary | ICD-10-CM

## 2023-02-16 NOTE — Progress Notes (Signed)
Office Note    HPI: [name] is a 77 y.o. ([birth year]) male being seen in follow-up with longstanding history of slowly healing right pretibial wound.  [name] was originally seen in my office at the end of 2022 and noted to have injured his calf from a car door.  This resulted in 4 compartment fasciotomy on 01/27/2021 with subsequent skin grafting.  He has had difficulty with wound healing since that time.  At his last visit, I sent him to wound care-specifically Dr. Lady Gary for evaluation. ABI demonstrated normal perfusion bilaterally.   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  Clearance is independent, and moderately ambulatory.  This is limited due to his size.  On exam today, [name] was doing well.  The pretibial ulceration has completely healed.  He was very appreciative of the referral and Dr. America Brown work.  He is using compression stockings on a daily basis.  The pt is  on a statin for cholesterol management.  The pt is  on a daily aspirin.   Other AC:  - The pt is  on medication for hypertension.   The pt is not diabetic.  Tobacco hx:  former  Past Medical History:  Diagnosis Date   Cancer (HCC)    CHF (congestive heart failure) (HCC)    EF 45-50% on 08/2019 echo   Chronic kidney disease    RENAL STONES   Coronary artery disease    3 vessel disease: DES to RCA, DES to LCx 08/2019 and medical management   High frequency hearing loss    History of migraine headaches    Hx of colonic polyps    Hyperlipidemia    Hypertension    Obesity    Pulmonary hypertension (HCC)    Seborrheic dermatitis    Vitiligo     Past Surgical History:  Procedure Laterality Date   APPLICATION OF WOUND VAC Right 01/24/2021   Procedure: APPLICATION OF WOUND VAC;  Surgeon: Huel Cote, MD;  [facility]: [facility];  Service: Orthopedics;  Laterality: Right;   COLON  SURGERY  01/30/2001   CORONARY STENT INTERVENTION N/A 08/12/2019   Procedure: CORONARY STENT INTERVENTION;  Surgeon: Yvonne Kendall, MD;  Location: [facility] INVASIVE CV LAB;  Service: Cardiovascular;  Laterality: N/A;   CORONARY ULTRASOUND/IVUS N/A 08/12/2019   Procedure: Intravascular Ultrasound/IVUS;  Surgeon: Yvonne Kendall, MD;  [facility]: [facility];  Service: Cardiovascular;  Laterality: N/A;   CYSTOSCOPY N/A 02/03/2021   Procedure: CYSTOSCOPY, CLOT EVAUATION;  Surgeon: Heloise Purpura, MD;  Location: [facility] OR;  Service: Urology;  Laterality: N/A;   CYSTOSCOPY/URETEROSCOPY/HOLMIUM LASER/STENT PLACEMENT Left 02/04/2019   Procedure: CYSTOSCOPY Left retrograde and attempted left ureteroscopy;  Surgeon: Heloise Purpura, MD;  Location: [facility] ORS;  Service: Urology;  Laterality: Left;  ONLY NEEDS 60 MIN   CYSTOSCOPY/URETEROSCOPY/HOLMIUM LASER/STENT PLACEMENT Left 11/07/2019   Procedure: CYSTOSCOPY/URETEROSCOPY/HOLMIUM LASER/STENT PLACEMENT;  Surgeon: Heloise Purpura, MD;  Location: [facility] ORS;  Service: Urology;  Laterality: Left;   FASCIOTOMY Right 01/24/2021   Procedure: FASCIOTOMY RIGHT LEG;  Surgeon: Huel Cote, MD;  [facility];  Service: Orthopedics;  Laterality: Right;   FRACTURE SURGERY     I & D EXTREMITY Right 01/27/2021   Procedure: IRRIGATION AND DEBRIDEMENT RIGHT LOWER EXTREMITY;  Surgeon: Huel Cote, MD;  Location: [facility];  Service: Orthopedics;  Laterality: Right;   IR CONVERT LEFT NEPHROSTOMY TO NEPHROURETERAL CATH  09/17/2019   IR NEPHROSTOMY PLACEMENT LEFT  02/05/2019   IR NEPHROSTOMY PLACEMENT LEFT  08/06/2019   LEFT HEART CATH AND CORONARY ANGIOGRAPHY N/A 08/09/2019   Procedure: LEFT HEART CATH AND CORONARY ANGIOGRAPHY;  Surgeon: Corky Crafts, MD;  Location: [facility] INVASIVE CV LAB;  Service: Cardiovascular;  Laterality: N/A;   SKIN BIOPSY     TONSILLECTOMY AND ADENOIDECTOMY      Social History   Socioeconomic History   Marital status: Married    Spouse name: Not  on file   Number of children: Not on file   Years of education: Not on file   Highest education level: Not on file  Occupational History   Not on file  Tobacco Use   Smoking status: Former    Current packs/day: 0.00    Average packs/day: 1 pack/day for 40.0 years (40.0 ttl pk-yrs)    Types: Cigarettes    Start date: 52    Quit date: 67    Years since quitting: 27.8   Smokeless tobacco: Never  Vaping Use   Vaping status: Never Used  Substance and Sexual Activity   Alcohol use: Not Currently   Drug use: Never   Sexual activity: Not on file  Other Topics Concern   Not on file  Social History Narrative   Not on file   Social Determinants of Health   Financial Resource Strain: Medium Risk (08/16/2022)   Received from [facility], [facility]   Overall Financial Resource Strain (CARDIA)    Difficulty of Paying Living Expenses: Somewhat hard  Food Insecurity: No Food Insecurity (08/16/2022)   [facility]   Hunger Vital Sign    Worried About Running Out of Food in the Last Year: Never true    Ran Out of Food in the Last Year: Never true  Transportation Needs: No Transportation Needs (08/16/2022)   Received from [facility], [facility]   PRAPARE - Transportation    Lack of Transportation (Medical): No    Lack of Transportation (Non-Medical): No  Physical Activity: Unknown (08/16/2022)   Received from [facility], [facility]   Exercise Vital Sign    Days of Exercise per Week: 0 days    Minutes of Exercise per Session: Not on file  Stress: No Stress Concern Present (08/16/2022)   Received from [facility], [facility] of Occupational [facility] - Occupational Stress Questionnaire    Feeling of Stress : Only a little  Social Connections: Moderately Integrated (08/16/2022)   Received from [facility], [facility]   Social Network    How would you rate your social network (family, work, friends)?: Adequate  participation with social networks  Intimate Partner Violence: Not At Risk (08/16/2022)   Received from [facility], [facility]   HITS    Over the last 12 months how often did your partner physically hurt you?: 1    Over the last 12 months how often did your partner insult you or talk down to you?: 1    Over the last 12 months how often did your partner threaten you with physical harm?: 1    Over the last 12 months how often did your partner scream or curse at you?: 1   No family history on file.  Current Outpatient Medications  Medication Sig Dispense Refill   aspirin EC 81 MG EC tablet Take 1 tablet (81 mg total) by mouth daily. 30 tablet 11   ezetimibe (ZETIA) 10 MG  tablet TAKE 1 TABLET(10 MG) BY MOUTH DAILY 90 tablet 2   finasteride (PROSCAR) 5 MG tablet Take 1 tablet (5 mg total) by mouth daily. 30 tablet 3   hydrocortisone (ANUSOL-HC) 2.5 % rectal cream Place rectally 3 (three) times daily. 30 g 0   metoprolol succinate (TOPROL-XL) 25 MG 24 hr tablet Take 1 tablet (25 mg total) by mouth daily. 90 tablet 3   nitroGLYCERIN (NITROSTAT) 0.4 MG SL tablet Place 1 tablet (0.4 mg total) under the tongue every 5 (five) minutes as needed for chest pain. 25 tablet 3   Omega-3 Fatty Acids (FISH OIL) 1000 MG CPDR Take 2,000 mg by mouth daily.     rosuvastatin (CRESTOR) 5 MG tablet Take 1 tablet (5 mg total) by mouth daily. 90 tablet 3   silver sulfADIAZINE (SILVADENE) 1 % cream Apply 1 application topically daily. 50 g 0   No current facility-administered medications for this visit.    Allergies  Allergen Reactions   Codeine     Bad Headache   Keflex [Cephalexin] Rash     REVIEW OF SYSTEMS:   [X]  denotes positive finding, [ ]  denotes negative finding Cardiac  Comments:  Chest pain or chest pressure:    Shortness of breath upon exertion:    Short of breath when lying flat:    Irregular heart rhythm:        Vascular    Pain in calf, thigh, or hip brought on by ambulation:     Pain in feet at night that wakes you up from your sleep:     Blood clot in your veins:    Leg swelling:         Pulmonary    Oxygen at home:    Productive cough:     Wheezing:         Neurologic    Sudden weakness in arms or legs:     Sudden numbness in arms or legs:     Sudden onset of difficulty speaking or slurred speech:    Temporary loss of vision in one eye:     Problems with dizziness:         Gastrointestinal    Blood in stool:     Vomited blood:         Genitourinary    Burning when urinating:     Blood in urine:        Psychiatric    Major depression:         Hematologic    Bleeding problems:    Problems with blood clotting too easily:        Skin    Rashes or ulcers:        Constitutional    Fever or chills:      PHYSICAL EXAMINATION:  There were no vitals filed for this visit.   General:  WDWN in NAD; vital signs documented above Gait: Not observed HENT: WNL, normocephalic Pulmonary: normal non-labored breathing , without wheezing Cardiac: regular HR, Abdomen: soft, NT, no masses Skin: without rashes Vascular Exam/Pulses:  Right Left  Radial 2+ (normal) 2+ (normal)  Ulnar 2+ (normal) 2+ (normal)          DP 2+ (normal) 2+ (normal)       Extremities: without ischemic changes, without Gangrene , without cellulitis; The right-sided fasciotomy wound is closed with granulation tissue rising out of the wound bed. Musculoskeletal: no muscle wasting or atrophy  Neurologic: A&O X 3;  No focal weakness or paresthesias  are detected Psychiatric:  The pt has Normal affect.   Non-Invasive Vascular Imaging:      Venous Reflux Times  +--------------+---------+------+-----------+------------+--------+  RIGHT        Reflux NoRefluxReflux TimeDiameter cmsComments                          Yes                                   +--------------+---------+------+-----------+------------+--------+  CFV          no                                               +--------------+---------+------+-----------+------------+--------+  FV mid        no                                              +--------------+---------+------+-----------+------------+--------+  Popliteal    no                                              +--------------+---------+------+-----------+------------+--------+  GSV at SFJ              yes    >500 ms      0.67              +--------------+---------+------+-----------+------------+--------+  GSV prox thighno                            0.57              +--------------+---------+------+-----------+------------+--------+  GSV mid thigh no                            0.46              +--------------+---------+------+-----------+------------+--------+  GSV dist thighno                            0.42              +--------------+---------+------+-----------+------------+--------+  GSV at knee             yes    >500 ms      0.38              +--------------+---------+------+-----------+------------+--------+  GSV prox calf no                            0.23              +--------------+---------+------+-----------+------------+--------+  SSV Pop Fossa no                            0.29              +--------------+---------+------+-----------+------------+--------+  SSV prox calf no  0.19              +--------------+---------+------+-----------+------------+--------+    ASSESSMENT/PLAN: Shann Hodgkinson is a 77 y.o. male presenting for evaluation of poorly healing right leg fasciotomy wound.    Fortunately since last seen, the wound has healed thanks to Dr. Lady Gary at the wound care center.    At this point, Ademir can follow-up with me as needed.   I asked that he continue to wear compression stockings on a daily basis.   Victorino Sparrow, MD [facility] [phone]

## 2023-02-27 ENCOUNTER — Encounter: Payer: Self-pay | Admitting: Cardiology

## 2023-02-27 ENCOUNTER — Telehealth: Payer: Self-pay | Admitting: *Deleted

## 2023-02-27 ENCOUNTER — Ambulatory Visit: Payer: Medicare Other | Attending: Cardiology | Admitting: Cardiology

## 2023-02-27 VITALS — BP 120/90 | HR 70 | Ht 68.0 in | Wt 276.0 lb

## 2023-02-27 DIAGNOSIS — E785 Hyperlipidemia, unspecified: Secondary | ICD-10-CM

## 2023-02-27 DIAGNOSIS — I25118 Atherosclerotic heart disease of native coronary artery with other forms of angina pectoris: Secondary | ICD-10-CM

## 2023-02-27 DIAGNOSIS — G4733 Obstructive sleep apnea (adult) (pediatric): Secondary | ICD-10-CM

## 2023-02-27 DIAGNOSIS — I272 Pulmonary hypertension, unspecified: Secondary | ICD-10-CM

## 2023-02-27 DIAGNOSIS — I255 Ischemic cardiomyopathy: Secondary | ICD-10-CM

## 2023-02-27 NOTE — Progress Notes (Signed)
Cardiology Office Note:    Date:  02/27/2023   ID:  [name], DOB 10-30-1945, MRN [id number]  PCP:  Eartha Inch, MD  Cardiologist:  Armanda Magic, MD    Referring MD: Eartha Inch, MD   Chief Complaint  Patient presents with   Coronary Artery Disease   Hyperlipidemia   Cardiomyopathy    History of Present Illness:    [name] is a 77 y.o. 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  April 2021 he was admitted with urosepsis due to obstructing renal calculus and developed hypoxic respiratory failure and elevated hsTrop.  Cath was done showing severe 3 vCAD and surgery was consulted but felt PCI along with medical management was best option.  He underwent PCI with DES to pRCA and pLCX.  He was discharged on DAPT with aspirin and Brilinta.   When I saw him in May 2021 he was doing well.  He was noted to have moderate PHTN with PASP on echo.  VQ scan showed no PE and PFTs were ordered but not done.  He underwent sleep study in Aug 2021 showing mild OSA with an AHI of 12.3/hr and O2 sats as low as 88% and was placed on auto CPAP But never followed up with getting the device.  At office visit in 2022 I ordered a ResMed CPAP on auto from 4 to 15 cm H2O.  He was supposed to follow-up 6 weeks after getting his device but this did not occur.  He was last seen by Gillian Shields, NP on 12/27/2022 and was not using his CPAP regularly.  He stated that he felt that he slept better without it because the facemask makes him claustrophobic.  Due to some dyspnea on exertion, a repeat echo was done showing normal LV function with EF 65 to 70% with normal diastolic function and no valvular heart disease.  He also underwent Lexiscan Myoview 01/18/2023 which showed no ischemia and EF 60%. It was felt that his SOB was like related to morbid obesity.  He has gained over 40lbs in the past few years.    He is here today for followup and is doing  well.  He denies any chest pain or pressure, PND, orthopnea, LE edema, dizziness, palpitations or syncope. He still has chronic DOE when walking to [employer].  He is compliant with his meds and is tolerating meds with no SE.    He is doing well with his PAP device and thinks that he has gotten used to it.  He tolerates the FF mask and feels the pressure is adequate.  Since going on PAP he feels rested in the am and has no significant daytime sleepiness.  He denies any significant mouth or nasal dryness or nasal congestion.  He does not think that he snores.     Past Medical History:  Diagnosis Date   Cancer [facility])    CHF (congestive heart failure) (HCC)    EF 45-50% on 08/2019 echo   Chronic kidney disease    RENAL STONES   Coronary artery disease    3 vessel disease: DES to RCA, DES to LCx 08/2019 and medical management   High frequency hearing loss    History of migraine headaches    Hx of colonic polyps    Hyperlipidemia    Hypertension    Obesity    Peripheral vascular disease (HCC)    Pulmonary hypertension (HCC)  Seborrheic dermatitis    Vitiligo     Past Surgical History:  Procedure Laterality Date   APPLICATION OF WOUND VAC Right 01/24/2021   Procedure: APPLICATION OF WOUND VAC;  Surgeon: Huel Cote, MD;  Location: [facility] OR;  Service: Orthopedics;  Laterality: Right;   COLON SURGERY  01/30/2001   CORONARY STENT INTERVENTION N/A 08/12/2019   Procedure: CORONARY STENT INTERVENTION;  Surgeon: Yvonne Kendall, MD;  Location: [facility] INVASIVE CV LAB;  Service: Cardiovascular;  Laterality: N/A;   CORONARY ULTRASOUND/IVUS N/A 08/12/2019   Procedure: Intravascular Ultrasound/IVUS;  Surgeon: Yvonne Kendall, MD;  Location[facility] LAB;  Service: Cardiovascular;  Laterality: N/A;   CYSTOSCOPY N/A 02/03/2021   Procedure: CYSTOSCOPY, CLOT EVAUATION;  Surgeon: Heloise Purpura, MD;  Location: [facility] OR;  Service: Urology;  Laterality: N/A;   CYSTOSCOPY/URETEROSCOPY/HOLMIUM LASER/STENT  PLACEMENT Left 02/04/2019   Procedure: CYSTOSCOPY Left retrograde and attempted left ureteroscopy;  Surgeon: Heloise Purpura, MD;  Location: [facility] ORS;  Service: Urology;  Laterality: Left;  ONLY NEEDS 60 MIN   CYSTOSCOPY/URETEROSCOPY/HOLMIUM LASER/STENT PLACEMENT Left 11/07/2019   Procedure: CYSTOSCOPY/URETEROSCOPY/HOLMIUM LASER/STENT PLACEMENT;  Surgeon: Heloise Purpura, MD;  Location: [facility] OR[facility];  Service: Urology;  Laterality: Left;   FASCIOTOMY Right 01/24/2021   Procedure: FASCIOTOMY RIGHT LEG;  Surgeon: Huel Cote, MD;  Location[facility] OR;  Service: Orthopedics;  Laterality: Right;   FRACTURE SURGERY     I & D EXTREMITY Right 01/27/2021   Procedure: IRRIGATION AND DEBRIDEMENT RIGHT LOWER EXTREMITY;  Surgeon: Huel Cote, MD;  Location: [facility] OR;  Service: Orthopedics;  Laterality: Right;   IR CONVERT LEFT NEPHROSTOMY TO NEPHROURETERAL CATH  09/17/2019   IR NEPHROSTOMY PLACEMENT LEFT  02/05/2019   IR NEPHROSTOMY PLACEMENT LEFT  08/06/2019   LEFT HEART CATH AND CORONARY ANGIOGRAPHY N/A 08/09/2019   Procedure: LEFT HEART CATH AND CORONARY ANGIOGRAPHY;  Surgeon: Corky Crafts, MD;  Location: [facility] INVASIVE CV LAB;  Service: Cardiovascular;  Laterality: N/A;   SKIN BIOPSY     TONSILLECTOMY AND ADENOIDECTOMY      Current Medications: Current Meds  Medication Sig   aspirin EC 81 MG EC tablet Take 1 tablet (81 mg total) by mouth daily.   ezetimibe (ZETIA) 10 MG tablet TAKE 1 TABLET(10 MG) BY MOUTH DAILY   finasteride (PROSCAR) 5 MG tablet Take 1 tablet (5 mg total) by mouth daily.   metoprolol succinate (TOPROL-XL) 25 MG 24 hr tablet Take 1 tablet (25 mg total) by mouth daily.   Omega-3 Fatty Acids (FISH OIL) 1000 MG CPDR Take 2,000 mg by mouth daily.   rosuvastatin (CRESTOR) 5 MG tablet Take 1 tablet (5 mg total) by mouth daily.   [DISCONTINUED] hydrocortisone (ANUSOL-HC) 2.5 % rectal cream Place rectally 3 (three) times daily.   [DISCONTINUED] silver sulfADIAZINE (SILVADENE) 1 % cream Apply  1 application topically daily.     Allergies:   Codeine and Keflex [cephalexin]   Social History   Socioeconomic History   Marital status: Married    Spouse name: Not on file   Number of children: Not on file   Years of education: Not on file   Highest education level: Not on file  Occupational History   Not on file  Tobacco Use   Smoking status: Former    Current packs/day: 0.00    Average packs/day: 1 pack/day for 40.0 years (40.0 ttl pk-yrs)    Types: Cigarettes    Start date: 93    Quit date: 67    Years since quitting: 27.8   Smokeless  tobacco: Never  Vaping Use   Vaping status: Never Used  Substance and Sexual Activity   Alcohol use: Not Currently   Drug use: Never   Sexual activity: Not on file  Other Topics Concern   Not on file  Social History Narrative   Not on file   Social Determinants of Health   Financial Resource Strain: Medium Risk (08/16/2022)   Received from [facility], [facility]   Overall Financial Resource Strain (CARDIA)    Difficulty of Paying Living Expenses: Somewhat hard  Food Insecurity: No Food Insecurity (08/16/2022)   Received from [facility], [facility]   Hunger Vital Sign    Worried About Running Out of Food in the Last Year: Never true    Ran Out of Food in the Last Year: Never true  Transportation Needs: No Transportation Needs (08/16/2022)   Received from [facility], [facility]   PRAPARE - Transportation    Lack of Transportation (Medical): No    Lack of Transportation (Non-Medical): No  Physical Activity: Unknown (08/16/2022)   Received from [facility], [facility]   Exercise Vital Sign    Days of Exercise per Week: 0 days    Minutes of Exercise per Session: Not on file  Stress: No Stress Concern Present (08/16/2022)   Received from [facility], [facility] of Occupational Health - Occupational Stress Questionnaire    Feeling of Stress : Only a little  Social Connections:  Moderately Integrated (08/16/2022)   Received from [facility], [facility]   Social Network    How would you rate your social network (family, work, friends)?: Adequate participation with social networks     Family History: The patient's family history is not on file.  ROS:   Please see the history of present illness.    ROS  All other systems reviewed and negative.   EKGs/Labs/Other Studies Reviewed:    The following studies were reviewed today: Cardiac cath, 2D echo, sleep study, PAP compliance download  Recent Labs: No results found for requested labs within last 365 days.   Recent Lipid Panel    Component Value Date/Time   CHOL 106 08/10/2020 0731   TRIG 96 08/10/2020 0731   HDL 28 (L) 08/10/2020 0731   CHOLHDL 3.8 08/10/2020 0731   CHOLHDL 9.0 08/09/2019 0521   VLDL 47 (H) 08/09/2019 0521   LDLCALC 59 08/10/2020 0731    Physical Exam:    VS:  BP (!) 120/90   Pulse 70   Ht 5\' 8"  (1.727 m)   Wt 276 lb (125.2 kg)   SpO2 97%   BMI 41.97 kg/m     Wt Readings from Last 3 Encounters:  02/27/23 276 lb (125.2 kg)  02/16/23 279 lb 6.4 oz (126.7 kg)  12/27/22 280 lb (127 kg)    GEN: Well nourished, well developed in no acute distress HEENT: Normal NECK: No JVD; No carotid bruits LYMPHATICS: No lymphadenopathy CARDIAC:RRR, no murmurs, rubs, gallops RESPIRATORY:  Clear to auscultation without rales, wheezing or rhonchi  ABDOMEN: Soft, non-tender, non-distended MUSCULOSKELETAL:  No edema; No deformity  SKIN: Warm and dry NEUROLOGIC:  Alert and oriented x 3 PSYCHIATRIC:  Normal affect   ASSESSMENT:    1. OSA (obstructive sleep apnea)   2. Coronary artery disease of native artery of native heart with stable angina pectoris (HCC)   3. Ischemic cardiomyopathy   4. Hyperlipidemia LDL goal <70   5. Pulmonary hypertension, unspecified (HCC)  PLAN:    In order of problems listed above  OSA - The patient is tolerating PAP therapy well without any  problems. The patient has been using and benefiting from PAP use and will continue to benefit from therapy.  -I will get a download from his DME  ASCAD -s/p type 2 NSTEMI due to demand ischemia in the setting of acute respiratory failure, CAD and sepsis -cath showed severe 3VCAD >> surgery was consulted but felt PCI along with medical management was best option. -s/p PCI to pRCA and pLCX with residual disease noted with 99% CTO mLAD.  Eugenie Birks Myoview 01/18/2023 due to fatigue/DOE showed no ischemic and SOB felt to be related to morbid obesity -He denies any anginal symptoms>>still has SOB with ambulation -Continue prescription drug management with aspirin 81 mg daily, Zetia 10 mg daily, Crestor 5 mg daily, Toprol-XL 25 mg daily with as needed refills   Ischemic CM  -volume status difficult to assess in setting of morbid obesity but does not appear overtly volume overloaded on exam today -mild reduction in his EF 45-50% by echo 08/2019 but normalized on repeat echo 03/2020 at 55-60% -Repeat 2D echo 01/17/2023 showed EF 65 to 70% -Continue prescription drug management with Toprol-XL 25 mg daily with as needed refills  HLD  -LDL goal < 70 -[facility] is following his lipids and he is getting them rechecked next week>>I have asked him to get the results and send them to me -Continue prescription drug management with Crestor 5 mg daily and Zetia 10 mg daily with as needed refills  Pulmonary HTN -PASP on echo 08/2019 and repeat 2D echo 01/20/2023 with no evidence of pulmonary hypertension -VQ scan negative for chronic thromboembolic disease -PFTs 2022 with minimal restrictive lung disease due to obesity -sleep study showed mild OSA and now on CPAP therapy  HTN -BP controlled on exam today>>he says that his BP is controlled at home -Continue prescription drug management with Toprol-XL 25 mg daily with as needed refills  Morbid Obesity -he was started on Wegovy but cannot afford it so I asked  him to talk with his is PCP at the [facility] about weight loss drugs  Followup with me in 6 months  Medication Adjustments/Labs and Tests Ordered: Current medicines are reviewed at length with the patient today.  Concerns regarding medicines are outlined above.  No orders of the defined types were placed in this encounter.  No orders of the defined types were placed in this encounter.   Signed, Armanda Magic, MD  02/27/2023 4:59 PM    [facility]

## 2023-02-27 NOTE — Telephone Encounter (Signed)
DME selection is [facility] Patient understands he will be contacted by [facility] to set up his cpap. Patient understands to call if [facility] does not contact him with new setup in a timely manner. Patient understands they will be called once confirmation has been received from [facility] that they have received their new machine to schedule 10 week follow up appointment.   [facility] notified of new cpap order  Please add to airview Patient was grateful for the call and thanked me.    

## 2023-02-27 NOTE — Patient Instructions (Addendum)
Medication Instructions:  Your physician recommends that you continue on your current medications as directed. Please refer to the Current Medication list given to you today.  *If you need a refill on your cardiac medications before your next appointment, please call your pharmacy*   Lab Work: None.  If you have labs (blood work) drawn today and your tests are completely normal, you will receive your results only by: MyChart Message (if you have MyChart) OR A paper copy in the mail If you have any lab test that is abnormal or we need to change your treatment, we will call you to review the results.   Testing/Procedures: None.   Follow-Up:   Your next appointment:   6 month(s)  Provider:   Armanda Magic, MD     Other Instructions Please forward [place] a copy of your cholesterol labs once they are completed.  [facility] is your new DME company- they will call you to set up an account.

## 2023-04-11 ENCOUNTER — Telehealth: Payer: Self-pay

## 2023-04-11 DIAGNOSIS — Z79899 Other long term (current) drug therapy: Secondary | ICD-10-CM

## 2023-04-11 DIAGNOSIS — E785 Hyperlipidemia, unspecified: Secondary | ICD-10-CM

## 2023-04-11 NOTE — Telephone Encounter (Signed)
Received labs performed at [facility] 03/06/23 from patient. Dr. Mayford Knife asking if patient was fasting for these labs. Call to patient who responds he was NOT fasting for these labs.   Cholesterol 132 mg/dL HDL 65.7 mg/dL LDL 61 mg/dL Triglycerides 846 mg dL VLDL 96.2 mg/dL

## 2023-04-21 NOTE — Telephone Encounter (Signed)
Call to patient to ask that he do some repeat fasting lipid labs, patient agrees to plan, orders placed and released.

## 2023-04-21 NOTE — Addendum Note (Signed)
Addended by: Luellen Pucker on: 04/21/2023 05:01 PM   Modules accepted: Orders

## 2023-05-02 DIAGNOSIS — Z79899 Other long term (current) drug therapy: Secondary | ICD-10-CM | POA: Diagnosis not present

## 2023-05-02 DIAGNOSIS — E785 Hyperlipidemia, unspecified: Secondary | ICD-10-CM | POA: Diagnosis not present

## 2023-05-03 LAB — LIPID PANEL
Chol/HDL Ratio: 3.6 {ratio} (ref 0.0–5.0)
Cholesterol, Total: 138 mg/dL (ref 100–199)
HDL: 38 mg/dL — ABNORMAL LOW (ref >39)
LDL Chol Calc (NIH): 56 mg/dL (ref 0–99)
Triglycerides: 277 mg/dL — ABNORMAL HIGH (ref 0–149)
VLDL Cholesterol Cal: 44 mg/dL — ABNORMAL HIGH (ref 5–40)

## 2023-05-03 LAB — ALT: ALT: 30 [IU]/L (ref 0–44)

## 2023-05-16 ENCOUNTER — Telehealth: Payer: Self-pay

## 2023-05-16 DIAGNOSIS — E785 Hyperlipidemia, unspecified: Secondary | ICD-10-CM

## 2023-05-16 NOTE — Telephone Encounter (Signed)
 Call to patient to advise of cholesterol lab results, he reports he was fasting for these labs. Patient responses forwarded to Dr. Mayford Knife.

## 2023-05-16 NOTE — Telephone Encounter (Signed)
-----   Message from Armanda Magic sent at 05/07/2023  9:18 AM EST ----- Was this a fasting lipid?

## 2023-05-19 DIAGNOSIS — R972 Elevated prostate specific antigen [PSA]: Secondary | ICD-10-CM | POA: Diagnosis not present

## 2023-05-25 NOTE — Addendum Note (Signed)
Addended by: Luellen Pucker on: 05/25/2023 10:54 AM   Modules accepted: Orders

## 2023-05-25 NOTE — Telephone Encounter (Signed)
Call to patient to advise that Dr. Mayford Knife recommends referral to lipid clinic for elevated triglycerides. No answer, left detailed message per DPR explaining I would place referral and someone would call him to schedule. Asked patient to call our office if any questions.

## 2023-05-25 NOTE — Telephone Encounter (Signed)
Pt requesting cb to further discuss

## 2023-05-26 DIAGNOSIS — R3912 Poor urinary stream: Secondary | ICD-10-CM | POA: Diagnosis not present

## 2023-05-26 DIAGNOSIS — N401 Enlarged prostate with lower urinary tract symptoms: Secondary | ICD-10-CM | POA: Diagnosis not present

## 2023-05-26 DIAGNOSIS — R972 Elevated prostate specific antigen [PSA]: Secondary | ICD-10-CM | POA: Diagnosis not present

## 2023-06-09 ENCOUNTER — Ambulatory Visit: Payer: Medicare Other | Attending: Cardiovascular Disease | Admitting: Student

## 2023-06-09 ENCOUNTER — Encounter: Payer: Self-pay | Admitting: Student

## 2023-06-09 VITALS — Ht 68.0 in | Wt 279.8 lb

## 2023-06-09 DIAGNOSIS — E785 Hyperlipidemia, unspecified: Secondary | ICD-10-CM

## 2023-06-09 MED ORDER — FENOFIBRATE 145 MG PO TABS: 145.0000 mg | ORAL_TABLET | Freq: Every day | ORAL | 11 refills | Status: AC

## 2023-06-09 NOTE — Patient Instructions (Signed)
 Your Results:             Your most recent labs Goal  Total Cholesterol 138 < 200  Triglycerides 277 < 150  HDL (happy/good cholesterol) 38 > 40  LDL (lousy/bad cholesterol 56 < 70   Medication changes: Continue taking Rosuvastatin  5 mg daily and Ezetimibe  10 mg daily. Start taking fenofibrate  145 mg daily.  Follow up alb is due on April 29,2025.

## 2023-06-09 NOTE — Progress Notes (Signed)
 Patient ID: [name]                 DOB: [birth year]                    MRN: [id number]      HPI: [name] is a 78 y.o. male patient referred to lipid clinic by Dr.Turner. 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  [facility]ed [facility] for [facility]. He does not know doses of his current cholesterol medications reports he could not tolerate any dose higher than what ever listed on his medication list. He eats whatever he likes to eat and unable to do exercise due to SOB and multiple  past lower legs injuries. He is interested to go on weight loss GLP1 but he can't afford to pay $250 every month so waiting for [facility] weight loss GLP1. We reviewed TG lowering therapies fenofibrate , Vascepa, Lovaza. He wants something that is covered by [place].   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 LDL goal: 70 TG gaol <150  Last lab: LDLc 56 mg/dl, TG 722 mg/dl HDL 38 mg/dl TC 861 mg/dl   Diet: eats out once a week.  Lives with daughter so eats whatever she cooks   Exercise: SOB limits activities   Family History:  Mother- lung cancer, colon polyps, hypertension  Father - dementia, hypertension  Bother- diabetes  Daughter - heart problems and diabetes    Social History:  Alcohol: none  Smoking: quit since 1997   Labs: Lipid Panel     Component Value Date/Time   CHOL 138 05/02/2023 0830   TRIG 277 (H) 05/02/2023 0830   HDL 38 (L) 05/02/2023 0830   CHOLHDL 3.6 05/02/2023 0830   CHOLHDL 9.0 08/09/2019 0521   VLDL 47 (H) 08/09/2019 0521   LDLCALC 56 05/02/2023 0830   LABVLDL 44 (H) 05/02/2023 0830    Past Medical History:  Diagnosis Date   Cancer (HCC)    CHF (congestive heart failure) (HCC)    EF 45-50% on 08/2019 echo   Chronic kidney disease     RENAL STONES   Coronary artery disease    3 vessel disease: DES to RCA, DES to LCx 08/2019 and medical management   High frequency hearing loss    History of migraine headaches    Hx of colonic polyps    Hyperlipidemia    Hypertension    Obesity    Peripheral vascular disease (HCC)    Pulmonary hypertension (HCC)    Seborrheic dermatitis    Vitiligo     Current Outpatient Medications on File Prior to Visit  Medication Sig Dispense Refill   aspirin  EC 81 MG EC tablet Take 1 tablet (81 mg total) by mouth daily. 30 tablet 11   ezetimibe  (ZETIA ) 10 MG tablet TAKE 1 TABLET(10 MG) BY MOUTH DAILY 90 tablet 2   finasteride  (PROSCAR ) 5 MG tablet Take 1 tablet (5 mg total) by mouth daily. 30 tablet 3   metoprolol  succinate (TOPROL -XL) 25 MG 24 hr tablet Take 1 tablet (25 mg total) by mouth daily. 90 tablet 3   nitroGLYCERIN  (NITROSTAT ) 0.4 MG SL tablet Place 1 tablet (0.4 mg total) under the tongue every 5 (five) minutes as needed for chest pain. 25 tablet 3  Omega-3 Fatty Acids (FISH OIL) 1000 MG CPDR Take 2,000 mg by mouth daily.     rosuvastatin  (CRESTOR ) 5 MG tablet Take 1 tablet (5 mg total) by mouth daily. 90 tablet 3   No current facility-administered medications on file prior to visit.    Allergies  Allergen Reactions   Codeine     Bad Headache   Keflex  [Cephalexin ] Rash    Assessment/Plan:  1. Hyperlipidemia -  Problem  Hyperlipidemia   Hyperlipidemia Assessment:  LDL goal: < 70 mg/dl last LDLc 56 mg/dl at goal but TG 722 mg/dl TG goal <849 mg.dl  Tolerates Zetia  and statins well without any side effects. unable to tolerate higher than 5 mg dose of Crestor  due to myalgia  Needs improvement in diet, exercise capacity limited by SOB and multiple past lower extremities injury   Discussed fenofibrate , Vascepa, Lovaza - dose, effectiveness, cost.   Plan: Continue taking current medications (Crestor  5 mg daily and Zetia  10 mg daily) Start taking fenofibrate  145 mg daily   Follow up fasting lipid lab and LFT due on April 29,2025 (copy of  lab order provided to the patient)      Thank you,  Robbi Blanch, Pharm.D [facility] of [facility] [address] [place], [place] [address]  Phone: [phone]; Fax: [phone]

## 2023-06-09 NOTE — Assessment & Plan Note (Signed)
 Assessment:  LDL goal: < 70 mg/dl last LDLc 56 mg/dl at goal but TG 722 mg/dl TG goal <849 mg.dl  Tolerates Zetia  and statins well without any side effects. unable to tolerate higher than 5 mg dose of Crestor  due to myalgia  Needs improvement in diet, exercise capacity limited by SOB and multiple past lower extremities injury   Discussed fenofibrate , Vascepa, Lovaza - dose, effectiveness, cost.   Plan: Continue taking current medications (Crestor  5 mg daily and Zetia  10 mg daily) Start taking fenofibrate  145 mg daily  Follow up fasting lipid lab and LFT due on April 29,2025 (copy of  lab order provided to the patient)

## 2023-08-30 ENCOUNTER — Telehealth: Payer: Self-pay | Admitting: Pharmacist

## 2023-08-30 NOTE — Telephone Encounter (Signed)
 Fenofibrate  was added to moderate intensity statin - f/u lab is due, call to remind pt, N/A LVM and MyChart sent

## 2023-08-31 NOTE — Telephone Encounter (Signed)
 Will be bring his wife 09/04/2023 for her appointment so will get lab done same day.

## 2023-09-04 DIAGNOSIS — E785 Hyperlipidemia, unspecified: Secondary | ICD-10-CM | POA: Diagnosis not present

## 2023-09-05 ENCOUNTER — Telehealth: Payer: Self-pay | Admitting: Pharmacy Technician

## 2023-09-05 ENCOUNTER — Telehealth: Payer: Self-pay | Admitting: Pharmacist

## 2023-09-05 ENCOUNTER — Other Ambulatory Visit (HOSPITAL_COMMUNITY): Payer: Self-pay

## 2023-09-05 LAB — HEPATIC FUNCTION PANEL
ALT: 35 IU/L (ref 0–44)
AST: 47 IU/L — ABNORMAL HIGH (ref 0–40)
Albumin: 4.3 g/dL (ref 3.8–4.8)
Alkaline Phosphatase: 64 IU/L (ref 44–121)
Bilirubin Total: 0.5 mg/dL (ref 0.0–1.2)
Bilirubin, Direct: 0.27 mg/dL (ref 0.00–0.40)
Total Protein: 6.7 g/dL (ref 6.0–8.5)

## 2023-09-05 LAB — LIPID PANEL
Chol/HDL Ratio: 4.1 ratio (ref 0.0–5.0)
Cholesterol, Total: 127 mg/dL (ref 100–199)
HDL: 31 mg/dL — ABNORMAL LOW (ref >39)
LDL Chol Calc (NIH): 57 mg/dL (ref 0–99)
Triglycerides: 245 mg/dL — ABNORMAL HIGH (ref 0–149)
VLDL Cholesterol Cal: 39 mg/dL (ref 5–40)

## 2023-09-05 NOTE — Telephone Encounter (Signed)
 Pharmacy Patient Advocate Encounter  Insurance verification completed.   The patient is insured through [employer]   Ran test claim for [employer]. Currently a quantity of 120 is a 30 day supply and the co-pay is 336.04- one month .   Ran test claim for icosapent GENERIC. Currently a quantity of 120 is a 30 day supply and the co-pay is 236.18- one month .  This test claim was processed through [facility]- copay amounts may vary at other pharmacies due to pharmacy/plan contracts, or as the patient moves through the different stages of their insurance plan.

## 2023-09-05 NOTE — Telephone Encounter (Signed)
 Call to discuss lipid lab. N/A LVM to call me back at [phone].  TG still above the goal before fenofibrate  it was 277 and now it is 245 mg/dl. Will consider replacing it with Vascepa 2 gm twice daily

## 2023-09-05 NOTE — Telephone Encounter (Signed)
 Patient called back inform him about the plan. Will assess coverage for Vascepa. Pt states If [employer] will cover it and co-pay will be high he has to reach out to [place] provider to get Vascepa approved from them to make to affordable.

## 2023-09-06 ENCOUNTER — Encounter: Payer: Self-pay | Admitting: *Deleted

## 2023-09-06 ENCOUNTER — Telehealth: Payer: Self-pay | Admitting: Pharmacist

## 2023-09-06 NOTE — Telephone Encounter (Signed)
-----   Message from Gaylyn Keas sent at 09/05/2023  5:05 PM EDT ----- LDL good but TAGs are high - please verify that he started the fenofibrate  and that this lab was fasting

## 2023-09-06 NOTE — Telephone Encounter (Signed)
 Result discussed with the patient, states this lab was fasting and he has been taking fenofibrate  regularly prescribed dose. We assess coverage for Vascepa both brand and generic. It is cost prohibitive. [facility] will take [facility] notes with recent lab so see if they would cover Vascepa for him at lower cost then his regular insurance. Notes at ready at the [facility] front desk for patient to pick it up today. Patient made aware.

## 2023-09-22 ENCOUNTER — Telehealth: Payer: Self-pay | Admitting: Pharmacist

## 2023-09-22 DIAGNOSIS — E785 Hyperlipidemia, unspecified: Secondary | ICD-10-CM

## 2023-09-22 NOTE — Telephone Encounter (Signed)
 Spoke to pt, started taking Vascepa from 15 og June. Will be going for follow up lab on Aug 25,2025.

## 2023-12-28 NOTE — Telephone Encounter (Signed)
 Call pt to remind about lipid lab. Reports he ran out of Vascepa from past 1 week and [facility] will be [facility] order next week so he will restart Vascepa from next week and go for lab on Sept 16, 2025

## 2024-01-16 DIAGNOSIS — E785 Hyperlipidemia, unspecified: Secondary | ICD-10-CM | POA: Diagnosis not present

## 2024-01-17 ENCOUNTER — Ambulatory Visit: Payer: Self-pay | Admitting: Pharmacist

## 2024-01-17 LAB — LIPID PANEL
Chol/HDL Ratio: 3.3 ratio (ref 0.0–5.0)
Cholesterol, Total: 112 mg/dL (ref 100–199)
HDL: 34 mg/dL — ABNORMAL LOW (ref >39)
LDL Chol Calc (NIH): 51 mg/dL (ref 0–99)
Triglycerides: 158 mg/dL — ABNORMAL HIGH (ref 0–149)
VLDL Cholesterol Cal: 27 mg/dL (ref 5–40)

## 2024-01-17 LAB — HEPATIC FUNCTION PANEL
ALT: 20 IU/L (ref 0–44)
AST: 18 IU/L (ref 0–40)
Albumin: 4.1 g/dL (ref 3.8–4.8)
Alkaline Phosphatase: 89 IU/L (ref 47–123)
Bilirubin Total: 0.4 mg/dL (ref 0.0–1.2)
Bilirubin, Direct: 0.22 mg/dL (ref 0.00–0.40)
Total Protein: 6.4 g/dL (ref 6.0–8.5)
